# Patient Record
Sex: Female | Born: 1940 | Race: White | Hispanic: No | Smoking: Former smoker
Health system: Southern US, Community
[De-identification: ages and names within clinical notes are randomized; demographics above are authoritative.]

## PROBLEM LIST (undated history)

## (undated) DIAGNOSIS — M1990 Unspecified osteoarthritis, unspecified site: Secondary | ICD-10-CM

## (undated) DIAGNOSIS — T7840XA Allergy, unspecified, initial encounter: Secondary | ICD-10-CM

## (undated) DIAGNOSIS — E7800 Pure hypercholesterolemia, unspecified: Secondary | ICD-10-CM

## (undated) DIAGNOSIS — R32 Unspecified urinary incontinence: Secondary | ICD-10-CM

## (undated) DIAGNOSIS — G709 Myoneural disorder, unspecified: Secondary | ICD-10-CM

## (undated) DIAGNOSIS — Z8719 Personal history of other diseases of the digestive system: Secondary | ICD-10-CM

## (undated) DIAGNOSIS — D649 Anemia, unspecified: Secondary | ICD-10-CM

## (undated) DIAGNOSIS — J189 Pneumonia, unspecified organism: Secondary | ICD-10-CM

## (undated) DIAGNOSIS — F32A Depression, unspecified: Secondary | ICD-10-CM

## (undated) DIAGNOSIS — K219 Gastro-esophageal reflux disease without esophagitis: Secondary | ICD-10-CM

## (undated) DIAGNOSIS — F329 Major depressive disorder, single episode, unspecified: Secondary | ICD-10-CM

## (undated) DIAGNOSIS — F419 Anxiety disorder, unspecified: Secondary | ICD-10-CM

## (undated) DIAGNOSIS — I10 Essential (primary) hypertension: Secondary | ICD-10-CM

## (undated) DIAGNOSIS — E039 Hypothyroidism, unspecified: Secondary | ICD-10-CM

## (undated) HISTORY — DX: Allergy, unspecified, initial encounter: T78.40XA

## (undated) HISTORY — PX: BACK SURGERY: SHX140

## (undated) HISTORY — PX: COLONOSCOPY: SHX174

## (undated) HISTORY — PX: TONSILLECTOMY: SUR1361

## (undated) HISTORY — PX: CARPAL TUNNEL RELEASE: SHX101

## (undated) HISTORY — PX: ABDOMINAL HYSTERECTOMY: SHX81

## (undated) HISTORY — PX: EYE SURGERY: SHX253

## (undated) HISTORY — PX: OTHER SURGICAL HISTORY: SHX169

## (undated) HISTORY — PX: WISDOM TOOTH EXTRACTION: SHX21

## (undated) HISTORY — PX: JOINT REPLACEMENT: SHX530

---
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
## 1991-02-19 HISTORY — PX: SALPINGOOPHORECTOMY: SHX82

## 1997-06-01 ENCOUNTER — Ambulatory Visit (HOSPITAL_COMMUNITY): Admission: RE | Admit: 1997-06-01 | Discharge: 1997-06-01 | Payer: Self-pay | Admitting: Family Medicine

## 1997-07-12 ENCOUNTER — Ambulatory Visit (HOSPITAL_COMMUNITY): Admission: RE | Admit: 1997-07-12 | Discharge: 1997-07-12 | Payer: Self-pay | Admitting: Family Medicine

## 1997-08-31 ENCOUNTER — Ambulatory Visit (HOSPITAL_COMMUNITY): Admission: RE | Admit: 1997-08-31 | Discharge: 1997-08-31 | Payer: Self-pay | Admitting: Family Medicine

## 1998-06-28 ENCOUNTER — Other Ambulatory Visit: Admission: RE | Admit: 1998-06-28 | Discharge: 1998-06-28 | Payer: Self-pay | Admitting: Obstetrics and Gynecology

## 1998-11-04 ENCOUNTER — Emergency Department (HOSPITAL_COMMUNITY): Admission: EM | Admit: 1998-11-04 | Discharge: 1998-11-04 | Payer: Self-pay | Admitting: Emergency Medicine

## 1998-11-04 ENCOUNTER — Encounter: Payer: Self-pay | Admitting: Emergency Medicine

## 2000-11-17 ENCOUNTER — Ambulatory Visit: Admission: RE | Admit: 2000-11-17 | Discharge: 2000-11-17 | Payer: Self-pay | Admitting: Family Medicine

## 2002-02-03 ENCOUNTER — Other Ambulatory Visit: Admission: RE | Admit: 2002-02-03 | Discharge: 2002-02-03 | Payer: Self-pay | Admitting: Obstetrics and Gynecology

## 2003-03-25 ENCOUNTER — Encounter: Admission: RE | Admit: 2003-03-25 | Discharge: 2003-05-17 | Payer: Self-pay | Admitting: Internal Medicine

## 2003-11-29 ENCOUNTER — Other Ambulatory Visit: Admission: RE | Admit: 2003-11-29 | Discharge: 2003-11-29 | Payer: Self-pay | Admitting: Internal Medicine

## 2003-12-17 ENCOUNTER — Observation Stay (HOSPITAL_COMMUNITY): Admission: EM | Admit: 2003-12-17 | Discharge: 2003-12-18 | Payer: Self-pay | Admitting: Diagnostic Radiology

## 2004-03-14 ENCOUNTER — Encounter: Admission: RE | Admit: 2004-03-14 | Discharge: 2004-03-14 | Payer: Self-pay | Admitting: Sports Medicine

## 2004-05-01 ENCOUNTER — Encounter: Admission: RE | Admit: 2004-05-01 | Discharge: 2004-05-01 | Payer: Self-pay | Admitting: Sports Medicine

## 2004-05-29 ENCOUNTER — Ambulatory Visit (HOSPITAL_COMMUNITY): Admission: RE | Admit: 2004-05-29 | Discharge: 2004-05-30 | Payer: Self-pay | Admitting: Neurological Surgery

## 2005-08-26 ENCOUNTER — Ambulatory Visit (HOSPITAL_COMMUNITY): Admission: RE | Admit: 2005-08-26 | Discharge: 2005-08-27 | Payer: Self-pay | Admitting: Neurological Surgery

## 2006-07-11 ENCOUNTER — Other Ambulatory Visit: Admission: RE | Admit: 2006-07-11 | Discharge: 2006-07-11 | Payer: Self-pay | Admitting: Internal Medicine

## 2008-03-24 ENCOUNTER — Inpatient Hospital Stay (HOSPITAL_COMMUNITY): Admission: RE | Admit: 2008-03-24 | Discharge: 2008-03-28 | Payer: Self-pay | Admitting: Neurological Surgery

## 2008-11-09 ENCOUNTER — Emergency Department (HOSPITAL_COMMUNITY): Admission: EM | Admit: 2008-11-09 | Discharge: 2008-11-09 | Payer: Self-pay | Admitting: Family Medicine

## 2010-01-23 DIAGNOSIS — G5740 Lesion of medial popliteal nerve, unspecified lower limb: Secondary | ICD-10-CM | POA: Insufficient documentation

## 2010-04-06 DIAGNOSIS — M159 Polyosteoarthritis, unspecified: Secondary | ICD-10-CM | POA: Insufficient documentation

## 2010-06-05 LAB — BASIC METABOLIC PANEL
BUN: 14 mg/dL (ref 6–23)
BUN: 16 mg/dL (ref 6–23)
CO2: 27 mEq/L (ref 19–32)
CO2: 29 mEq/L (ref 19–32)
Calcium: 10 mg/dL (ref 8.4–10.5)
Calcium: 8.2 mg/dL — ABNORMAL LOW (ref 8.4–10.5)
Chloride: 104 mEq/L (ref 96–112)
Chloride: 104 mEq/L (ref 96–112)
Creatinine, Ser: 0.91 mg/dL (ref 0.4–1.2)
Creatinine, Ser: 1.01 mg/dL (ref 0.4–1.2)
GFR calc Af Amer: >60 mL/min (ref >60)
GFR calc Af Amer: >60 mL/min (ref >60)
GFR calc non Af Amer: 55 mL/min — ABNORMAL LOW (ref >60)
GFR calc non Af Amer: >60 mL/min (ref >60)
Glucose, Bld: 101 mg/dL — ABNORMAL HIGH (ref 70–99)
Glucose, Bld: 129 mg/dL — ABNORMAL HIGH (ref 70–99)
Potassium: 4.6 mEq/L (ref 3.5–5.1)
Potassium: 4.8 mEq/L (ref 3.5–5.1)
Sodium: 135 mEq/L (ref 135–145)
Sodium: 140 mEq/L (ref 135–145)

## 2010-06-05 LAB — TYPE AND SCREEN
ABO/RH(D): B POS
Antibody Screen: NEGATIVE

## 2010-06-05 LAB — CBC
HCT: 33 % — ABNORMAL LOW (ref 36.0–46.0)
HCT: 39.5 % (ref 36.0–46.0)
Hemoglobin: 11.1 g/dL — ABNORMAL LOW (ref 12.0–15.0)
Hemoglobin: 13.4 g/dL (ref 12.0–15.0)
MCHC: 33.6 g/dL (ref 30.0–36.0)
MCHC: 34.1 g/dL (ref 30.0–36.0)
MCV: 89.8 fL (ref 78.0–100.0)
MCV: 90.1 fL (ref 78.0–100.0)
Platelets: 263 10*3/uL (ref 150–400)
Platelets: 311 10*3/uL (ref 150–400)
RBC: 3.66 MIL/uL — ABNORMAL LOW (ref 3.87–5.11)
RBC: 4.4 MIL/uL (ref 3.87–5.11)
RDW: 13.3 % (ref 11.5–15.5)
RDW: 13.5 % (ref 11.5–15.5)
WBC: 12.7 10*3/uL — ABNORMAL HIGH (ref 4.0–10.5)
WBC: 8.6 10*3/uL (ref 4.0–10.5)

## 2010-06-05 LAB — ABO/RH: ABO/RH(D): B POS

## 2010-07-03 NOTE — Op Note (Signed)
NAMEPHEONIX, WISBY             ACCOUNT NO.:  [id number]   MEDICAL RECORD NO.:  [id number]          PATIENT TYPE:  INP   LOCATION:  3172                         FACILITY:  [facility]   PHYSICIAN:  Stefani Dama, M.D.  DATE OF BIRTH:  16-Dec-1940   DATE OF PROCEDURE:  03/24/2008  DATE OF DISCHARGE:                               OPERATIVE REPORT   PREOPERATIVE DIAGNOSIS:  Spondylolisthesis with herniated nucleus  pulposus, L4-L5, lumbar radiculopathy.   POSTOPERATIVE DIAGNOSIS:  Spondylolisthesis with herniated nucleus  pulposus, L4-L5, lumbar radiculopathy.   PROCEDURE:  Posterior lumbar decompression L4-5, decompression of L4 and  L5 nerve roots, posterior lumbar interbody arthrodesis with PEEK  spacers, pedicle screw fixation L4-5, posterolateral arthrodesis using  local autograft and allograft.   SURGEON:  Stefani Dama, MD   FIRST ASSISTANT:  Clydene Fake, MD   INDICATIONS:  Shelly Hickman is a 70 year old individual who has had  significant back and bilateral lower extremity pain, worse on the right  side.  She has evidence of an advanced spondylolisthesis with a large  herniated disk at the L4-L5 level, eccentric to the right side.  She has  been advised regarding surgical decompression and arthrodesis.   PROCEDURE:  The patient was brought to the operating room, supine on the  stretcher.  After smooth induction of general endotracheal anesthesia,  she was turned prone.  Back was prepped with alcohol and DuraPrep, and  draped in the sterile fashion.  Midline incision was created with an  elliptical incision around her previous scar.  Scar was excised.  Dissection was carried down to lumbodorsal fascia, which was opened on  either side of the midline to expose interspinous spaces at L4 and L5.  The space was identified positively with radiography.  Then, bilateral  laminotomies were created removing the inferior margin lamina, arch of  L4 exposing the common dural  tube and takeoff of the L5 nerve root  inferiorly.  This was first done on the left side.  Complete facetectomy  was performed in this level and superiorly the L4 nerve root takeoff  could be identified.  On the left side, there was noted to be  significant adhesions and scar tissue which was released carefully using  the microdissection technique.  On the right side, then a large fragment  of disk was encountered under the L4 nerve root superiorly.  This was  carefully dissected, and freed the L4 nerve root doing microdissection  technique.  Further fragments of disk were then removed from under the  common dural tube.  The L5 nerve root was decompressed into the region  of the foramen, partly from disk material and partly from fibrosis.  The  disk space was then entered and significant quantity of severely  desiccated disk material was removed.  Complete diskectomy was performed  using a series of interspace spreaders to allow for a good dissection  within the disk space.  Ultimately, 10 mm PEEK spacers could be fitted  into the interspace, after the endplates were completely decorticated.  The PEEK spacers were filled with autograft and allograft with Infuse.  Infuse and autograft was also laid into the interspace.  The interspace  was filled with this material in addition to a left-sided and right-  sided PEEK spacer.  Pedicle entry sites were then chosen using  fluoroscopic guidance, 6.5 x 45 mm screws were placed in L4 and L5  radiographically.  These were confirmed being in good position with both  AP and lateral fluoroscopic images, and ultimately, 40-mm rods were used  to connect the 2 screws and tightened them in position by creating a  nice, stable, well-aligned construct.  With this, posterolateral gutters  that had been previously decorticated, were filled with combination of  allograft and Infuse.  Once this was completed, the egress of the nerve  roots particularly L4 and L5  were checked, and they were found be free  and clear.  Hemostasis in the soft tissues was established and the  lumbodorsal fascia was closed with #1 Vicryl in interrupted fashion, 2-0  Vicryl was used in the subcutaneous tissues, and 3-0 Vicryl  subcuticularly.  Dermabond was placed on the skin.  Blood loss was  estimated, 700 mL.  Approximately 200 mL of Cell Saver blood was  returned.      Stefani Dama, M.D.  Electronically Signed     HJE/MEDQ  D:  03/24/2008  T:  03/25/2008  Job:  [id number]

## 2010-07-03 NOTE — Discharge Summary (Signed)
Shelly Hickman, Shelly Hickman             ACCOUNT NO.:  [id number]   MEDICAL RECORD NO.:  [id number]          PATIENT TYPE:  INP   LOCATION:  3038                         FACILITY:  [facility]   PHYSICIAN:  Stefani Dama, M.D.  DATE OF BIRTH:  18-Aug-1940   DATE OF ADMISSION:  03/24/2008  DATE OF DISCHARGE:  03/28/2008                               DISCHARGE SUMMARY   ADDENDUM   No change in discharge instructions and no change in discharge summary  from March 27, 2008.  The patient remained overnight one more day.  She was ready for discharge home on March 28, 2008, comfortable on  Vicodin and Valium for pain control, eating well, voiding well, and  mobilizing well with physical therapy.  Follow up in 2-3 weeks with Dr.  Danielle Dess.  Contact our office prior to followup if any questions or  concerns.  Continue with low back precautions.      Shelly Hickman.      Stefani Dama, M.D.  Electronically Signed    SCI/MEDQ  D:  03/28/2008  T:  03/28/2008  Job:  [id number]

## 2010-07-03 NOTE — Discharge Summary (Signed)
[name], [name] NO.:  [id number]   MEDICAL RECORD NO.:  [id number]          PATIENT TYPE:  INP   LOCATION:  3038                         FACILITY:  [facility]   PHYSICIAN:  Danae Orleans. Venetia Maxon, M.D.  DATE OF BIRTH:  1941-01-07   DATE OF ADMISSION:  03/24/2008  DATE OF DISCHARGE:  03/27/2008                               DISCHARGE SUMMARY   REASON FOR ADMISSION:  Spondylolisthesis of L4-L5 with spondylosis.   FINAL DIAGNOSES:  Spondylolisthesis of L4-L5 with spondylosis.   HISTORY OF ILLNESS AND HOSPITAL COURSE:  Shelly Hickman is a 70-year-  old woman with lower extremity radiculopathy and mobile  spondylolisthesis of L4-L5.  She was admitted to the hospital and  underwent decompression and fusion with posterior lumbar interbody  fusion with pedicle screw fixation at the L4-5 level.  Postoperatively,  she did well and was gradually mobilized.  She did have some  postoperative fever, which improved with use of incentive spirometry.  She was gradually mobilized with physical therapy, was doing well on the  March 27, 2008, and was discharged home with rolling walker with  discharge medications of hydrocodone and Valium with instructions to  follow up with Dr. Danielle Dess 2-3 weeks postoperatively.      Danae Orleans. Venetia Maxon, M.D.  Electronically Signed     JDS/MEDQ  D:  03/27/2008  T:  03/28/2008  Job:  [id number]

## 2010-07-06 NOTE — Op Note (Signed)
Shelly Hickman, Shelly Hickman             ACCOUNT NO.:  [id number]   MEDICAL RECORD NO.:  [id number]          PATIENT TYPE:  OIB   LOCATION:  2899                         FACILITY:  [facility]   PHYSICIAN:  Stefani Dama, M.D.  DATE OF BIRTH:  07-Aug-1940   DATE OF PROCEDURE:  05/29/2004  DATE OF DISCHARGE:                                 OPERATIVE REPORT   PREOPERATIVE DIAGNOSIS:  Spondylosis, L4-5, with left lumbar radiculopathy  and synovial cyst.   POSTOPERATIVE DIAGNOSIS:  Spondylosis, L4-5, with left lumbar radiculopathy  and synovial cyst.   PROCEDURES:  Laminotomy and foraminotomy, L4-5, left, with operating  microscope and microdissection technique.   SURGEON:  Stefani Dama, M.D.   FIRST ASSISTANT:  Hewitt Shorts, M.D.   ANESTHESIA:  General endotracheal.   INDICATIONS:  The patient is a 70 year old individual who has had  significant back and left lower extremity pain since November.  She has  evidence of a large synovial cyst compressing her left L5 nerve root.  She  was advised regarding surgical extirpation.   DESCRIPTION OF PROCEDURE:  The patient was brought to the operating room  supine on the stretcher.  After smooth induction of general endotracheal  anesthesia, she was turned prone onto vertical rolls and placed carefully  with padding to all of the bony prominences.  The back was prepped with  Duraprep and draped in a sterile fashion.  X-ray guidance was used to  localize L4-5 and a midline incision was created and carried down to the  lumbar dorsal fascia after infiltrating the skin with 1% lidocaine with  epinephrine 1:100,000 for 8 mL.  The left paraspinous musculature was then  separated from the spinous process and posterior interspinous ligament and  dissection was performed subperiosteally to expose the interspace at L4-5.  A self-retaining retractor was placed in the wound and then a laminotomy was  created, removing the inferior margin of the lamina  of L4 out to the medial  wall of the facet at L4-5.  As the ligament was taken up, a cystic cavity  was entered which had thick grumous material that was removed from within  it.  The dura was then identified on the medial aspect and the inner layer  of the ligament was noted to be densely adherent to the dura.  Careful  dissection using microdissection technique under the operating microscope  was undertaken to remove in a fragment fashion the synovial cyst from the  medial aspect of the facet joint.  The medial facetectomy was performed,  removing the inferior margin of the lamina of L4, including the medial  facet, and this allowed for freeing of the cystic mass.  The dura was then  carefully dissected free both superiorly and inferiorly and out over the L5  nerve root, removing all of the cystic tissue.  In the end, the common dural  tube and the L5 nerve root were all decompressed.  Hemostasis in the soft  tissues was obtained.  The area was copiously irrigated with antibiotic  irrigating solution and then 40 mg of Depo-Medrol along with 25 mcg  of  fentanyl were left in the epidural space.  The lumbodorsal fascia was closed  with 1-0 Vicryl in interrupted fashion, 2-0 Vicryl  was used in the subcutaneous and subcuticular tissues and Dermabond was  placed on the skin.  The procedure was done with the assistance of Dr.  Newell Coral, who provided retraction along the medial aspect of the dura while  dissection was being completed.      HJE/MEDQ  D:  05/29/2004  T:  05/29/2004  Job:  [id number]

## 2010-07-06 NOTE — Op Note (Signed)
Shelly Hickman, Shelly Hickman             ACCOUNT NO.:  [id number]   MEDICAL RECORD NO.:  [id number]          PATIENT TYPE:  AMB   LOCATION:  SDS                          FACILITY:  [facility]   PHYSICIAN:  Stefani Dama, M.D.  DATE OF BIRTH:  1940-09-30   DATE OF PROCEDURE:  08/26/2005  DATE OF DISCHARGE:                                 OPERATIVE REPORT   PREOPERATIVE DIAGNOSIS:  Spondylosis L4-L5 with right lumbar radiculopathy.   POSTOPERATIVE DIAGNOSIS:  Spondylosis L4-L5 with right lumbar radiculopathy.   OPERATION:  Right L4-5 laminotomy and foraminotomy with decompression of the  right L5 nerve root, with the operating microscope, microdissection  technique used.   SURGEON:  Dr. Danielle Dess.   FIRST ASSISTANT:  Dr. Jeral Fruit.   ANESTHESIA:  General endotracheal.   INDICATIONS:  The patient is a 70 year old individual who has had  significant back and right lower extremity pain.  She underwent left-sided  laminotomy for a synovial cyst at L4-L5 a year ago, and this process has now  occurred on the right side with a large synovial cyst compressing the  exiting space for the L5 nerve root.   PROCEDURE:  The patient was brought to the operating room, supine on the  stretcher.  After smooth induction of general endotracheal anesthesia, she  was turned prone.  The back was prepped with DuraPrep and draped in a  sterile fashion.  An elliptical incision was made around the previous scar,  and this tissue was excised.  Dissection was carried down to the lumbodorsal  fascia, which was opened on the right side to expose the interlaminar space  at L4-L5.  This space was identified positively with a singular radiograph.  Laminotomy was then created, removing a significant amount of redundant  exogenous tissue from the external aspect of the facet joint.  Partial  facetectomy was completed, removing the inferior margin lamina of L4 out to  the medial wall, about a third of the facet joint.   Underlying this was  noted to be significant tissue that was attached to the yellow ligament, and  it was compressing the inner aspect of the thecal sac at the takeoff of the  L5 nerve root.  This was removed initially in a piecemeal fashion.  It was  noted be densely adherent to the thecal sac itself.  It was gently dissected  away with the use of the operating microscope and the microdissection  technique to explore this area and remove this redundant thickened tissue  that was pressing into the L5 nerve root.  Once this was completed, portions  of the tissue were noted to be attached superiorly and more medially to the  area of the dura, and this was dissected free.  Once all tissue was  dissected free and inspection identified good clearance of the common dural  tube at the takeoff the L5 nerve root and the L5 nerve root as it entered  the exit foramen, it was felt that the procedure was completed.  The  procedure was done both with Dr. Jeral Fruit to help and also explore and retract  the common  dural tube and the nerve root as the dissection ensued.  Then, 40  mg of Depo-Medrol along with 0.5 mL of fentanyl was left in the epidural  space.  Lumbodorsal fascia was reapproximated with #1 Vicryl in interrupted fashion,  2-0 Vicryl was used in the subcutaneous tissues, 3-0 Vicryl subcuticularly,  and Dermabond was placed on the skin.  The patient tolerated procedure well  and was returned to recovery in stable condition.      Stefani Dama, M.D.  Electronically Signed     HJE/MEDQ  D:  08/26/2005  T:  08/27/2005  Job:  [id number]

## 2011-02-04 DIAGNOSIS — H04209 Unspecified epiphora, unspecified lacrimal gland: Secondary | ICD-10-CM | POA: Insufficient documentation

## 2011-02-04 DIAGNOSIS — H04549 Stenosis of unspecified lacrimal canaliculi: Secondary | ICD-10-CM | POA: Insufficient documentation

## 2011-03-26 DIAGNOSIS — F339 Major depressive disorder, recurrent, unspecified: Secondary | ICD-10-CM | POA: Insufficient documentation

## 2011-03-26 DIAGNOSIS — M545 Low back pain, unspecified: Secondary | ICD-10-CM | POA: Insufficient documentation

## 2011-03-26 DIAGNOSIS — I10 Essential (primary) hypertension: Secondary | ICD-10-CM | POA: Insufficient documentation

## 2011-03-26 DIAGNOSIS — F32A Depression, unspecified: Secondary | ICD-10-CM | POA: Insufficient documentation

## 2011-05-09 DIAGNOSIS — N951 Menopausal and female climacteric states: Secondary | ICD-10-CM | POA: Insufficient documentation

## 2011-06-27 DIAGNOSIS — M8580 Other specified disorders of bone density and structure, unspecified site: Secondary | ICD-10-CM | POA: Insufficient documentation

## 2012-12-07 ENCOUNTER — Ambulatory Visit
Admission: RE | Admit: 2012-12-07 | Discharge: 2012-12-07 | Disposition: A | Discharge status: Home / Self Care | Payer: Medicare Other | Source: Ambulatory Visit | Attending: Family Medicine | Admitting: Family Medicine

## 2012-12-07 ENCOUNTER — Other Ambulatory Visit: Payer: Self-pay | Admitting: Family Medicine

## 2012-12-07 DIAGNOSIS — R059 Cough, unspecified: Secondary | ICD-10-CM

## 2012-12-07 DIAGNOSIS — R05 Cough: Secondary | ICD-10-CM

## 2012-12-07 DIAGNOSIS — R509 Fever, unspecified: Secondary | ICD-10-CM

## 2012-12-10 ENCOUNTER — Inpatient Hospital Stay (HOSPITAL_COMMUNITY): Payer: Medicare Other

## 2012-12-10 ENCOUNTER — Inpatient Hospital Stay (HOSPITAL_COMMUNITY)
Admission: AD | Admit: 2012-12-10 | Discharge: 2012-12-13 | DRG: 194 | Disposition: A | Discharge status: Home / Self Care | Payer: Medicare Other | Source: Ambulatory Visit | Attending: Internal Medicine | Admitting: Internal Medicine

## 2012-12-10 ENCOUNTER — Encounter (HOSPITAL_COMMUNITY): Payer: Self-pay

## 2012-12-10 DIAGNOSIS — K219 Gastro-esophageal reflux disease without esophagitis: Secondary | ICD-10-CM | POA: Diagnosis present

## 2012-12-10 DIAGNOSIS — Z880 Allergy status to penicillin: Secondary | ICD-10-CM

## 2012-12-10 DIAGNOSIS — I10 Essential (primary) hypertension: Secondary | ICD-10-CM | POA: Diagnosis present

## 2012-12-10 DIAGNOSIS — J189 Pneumonia, unspecified organism: Secondary | ICD-10-CM

## 2012-12-10 DIAGNOSIS — E871 Hypo-osmolality and hyponatremia: Secondary | ICD-10-CM | POA: Diagnosis present

## 2012-12-10 DIAGNOSIS — Z87891 Personal history of nicotine dependence: Secondary | ICD-10-CM

## 2012-12-10 HISTORY — DX: Pure hypercholesterolemia, unspecified: E78.00

## 2012-12-10 HISTORY — DX: Depression, unspecified: F32.A

## 2012-12-10 HISTORY — DX: Anxiety disorder, unspecified: F41.9

## 2012-12-10 HISTORY — DX: Anemia, unspecified: D64.9

## 2012-12-10 HISTORY — DX: Essential (primary) hypertension: I10

## 2012-12-10 HISTORY — DX: Major depressive disorder, single episode, unspecified: F32.9

## 2012-12-10 HISTORY — DX: Pneumonia, unspecified organism: J18.9

## 2012-12-10 LAB — URINALYSIS, ROUTINE W REFLEX MICROSCOPIC
Glucose, UA: NEGATIVE mg/dL
Hgb urine dipstick: NEGATIVE
Ketones, ur: 40 mg/dL — AB
Nitrite: NEGATIVE
Protein, ur: 100 mg/dL — AB
Specific Gravity, Urine: 1.032 — ABNORMAL HIGH (ref 1.005–1.030)
Urobilinogen, UA: 1 mg/dL (ref 0.0–1.0)
pH: 6 (ref 5.0–8.0)

## 2012-12-10 LAB — COMPREHENSIVE METABOLIC PANEL
ALT: 17 U/L (ref 0–35)
AST: 26 U/L (ref 0–37)
Albumin: 2.4 g/dL — ABNORMAL LOW (ref 3.5–5.2)
Alkaline Phosphatase: 89 U/L (ref 39–117)
BUN: 13 mg/dL (ref 6–23)
CO2: 31 mEq/L (ref 19–32)
Calcium: 9.3 mg/dL (ref 8.4–10.5)
Chloride: 91 mEq/L — ABNORMAL LOW (ref 96–112)
Creatinine, Ser: 0.9 mg/dL (ref 0.50–1.10)
GFR calc Af Amer: 73 mL/min — ABNORMAL LOW (ref >90)
GFR calc non Af Amer: 63 mL/min — ABNORMAL LOW (ref >90)
Glucose, Bld: 165 mg/dL — ABNORMAL HIGH (ref 70–99)
Potassium: 4.5 mEq/L (ref 3.5–5.1)
Sodium: 128 mEq/L — ABNORMAL LOW (ref 135–145)
Total Bilirubin: 0.3 mg/dL (ref 0.3–1.2)
Total Protein: 6.7 g/dL (ref 6.0–8.3)

## 2012-12-10 LAB — CBC
HCT: 33.1 % — ABNORMAL LOW (ref 36.0–46.0)
Hemoglobin: 10.9 g/dL — ABNORMAL LOW (ref 12.0–15.0)
MCH: 28.6 pg (ref 26.0–34.0)
MCHC: 32.9 g/dL (ref 30.0–36.0)
MCV: 86.9 fL (ref 78.0–100.0)
Platelets: 480 10*3/uL — ABNORMAL HIGH (ref 150–400)
RBC: 3.81 MIL/uL — ABNORMAL LOW (ref 3.87–5.11)
RDW: 13.6 % (ref 11.5–15.5)
WBC: 20.7 10*3/uL — ABNORMAL HIGH (ref 4.0–10.5)

## 2012-12-10 LAB — URINE MICROSCOPIC-ADD ON

## 2012-12-10 LAB — TROPONIN I
Troponin I: <0.3 ng/mL (ref <0.30)
Troponin I: <0.3 ng/mL (ref <0.30)

## 2012-12-10 MED ORDER — DULOXETINE HCL 30 MG PO CPEP
30.0000 mg | ORAL_CAPSULE | Freq: Every day | ORAL | Status: DC
  Administered 2012-12-10 – 2012-12-13 (×4): 30 mg via ORAL
  Filled 2012-12-10 (×4): qty 1

## 2012-12-10 MED ORDER — AMLODIPINE BESYLATE 10 MG PO TABS
10.0000 mg | ORAL_TABLET | Freq: Every day | ORAL | Status: DC
  Administered 2012-12-10 – 2012-12-13 (×4): 10 mg via ORAL
  Filled 2012-12-10 (×5): qty 1

## 2012-12-10 MED ORDER — VANCOMYCIN HCL 10 G IV SOLR
1250.0000 mg | Freq: Once | INTRAVENOUS | Status: AC
  Administered 2012-12-10: 1250 mg via INTRAVENOUS
  Filled 2012-12-10: qty 1250

## 2012-12-10 MED ORDER — VANCOMYCIN HCL 10 G IV SOLR
1250.0000 mg | Freq: Once | INTRAVENOUS | Status: DC
  Filled 2012-12-10: qty 1250

## 2012-12-10 MED ORDER — OMEGA-3-ACID ETHYL ESTERS 1 G PO CAPS
2.0000 g | ORAL_CAPSULE | Freq: Two times a day (BID) | ORAL | Status: DC
  Administered 2012-12-10 – 2012-12-13 (×6): 2 g via ORAL
  Filled 2012-12-10 (×7): qty 2

## 2012-12-10 MED ORDER — IOHEXOL 300 MG/ML  SOLN
80.0000 mL | Freq: Once | INTRAMUSCULAR | Status: AC | PRN
  Administered 2012-12-10: 80 mL via INTRAVENOUS

## 2012-12-10 MED ORDER — DEXTROSE 5 % IV SOLN
2.0000 g | Freq: Three times a day (TID) | INTRAVENOUS | Status: DC
  Filled 2012-12-10 (×2): qty 2

## 2012-12-10 MED ORDER — VANCOMYCIN HCL IN DEXTROSE 1-5 GM/200ML-% IV SOLN
1000.0000 mg | Freq: Two times a day (BID) | INTRAVENOUS | Status: DC
  Administered 2012-12-11: 1000 mg via INTRAVENOUS
  Filled 2012-12-10 (×2): qty 200

## 2012-12-10 MED ORDER — TRAZODONE HCL 100 MG PO TABS
100.0000 mg | ORAL_TABLET | Freq: Every day | ORAL | Status: DC
  Administered 2012-12-10 – 2012-12-12 (×3): 100 mg via ORAL
  Filled 2012-12-10 (×4): qty 1

## 2012-12-10 MED ORDER — SODIUM CHLORIDE 0.9 % IV SOLN
INTRAVENOUS | Status: DC
  Administered 2012-12-10 – 2012-12-13 (×3): via INTRAVENOUS

## 2012-12-10 MED ORDER — LORAZEPAM 0.5 MG PO TABS
0.5000 mg | ORAL_TABLET | Freq: Three times a day (TID) | ORAL | Status: DC
  Administered 2012-12-10 – 2012-12-12 (×8): 0.5 mg via ORAL
  Filled 2012-12-10 (×8): qty 1

## 2012-12-10 MED ORDER — SIMVASTATIN 10 MG PO TABS
10.0000 mg | ORAL_TABLET | Freq: Every day | ORAL | Status: DC
  Administered 2012-12-10 – 2012-12-12 (×3): 10 mg via ORAL
  Filled 2012-12-10 (×4): qty 1

## 2012-12-10 MED ORDER — DEXTROSE 5 % IV SOLN
2.0000 g | Freq: Three times a day (TID) | INTRAVENOUS | Status: DC
  Administered 2012-12-10 – 2012-12-11 (×2): 2 g via INTRAVENOUS
  Filled 2012-12-10 (×3): qty 2

## 2012-12-10 MED ORDER — FLUTICASONE PROPIONATE 50 MCG/ACT NA SUSP
2.0000 | Freq: Every day | NASAL | Status: DC
  Administered 2012-12-11 – 2012-12-13 (×3): 2 via NASAL
  Filled 2012-12-10: qty 16

## 2012-12-10 MED ORDER — HYDROCODONE-ACETAMINOPHEN 5-325 MG PO TABS
1.0000 | ORAL_TABLET | Freq: Four times a day (QID) | ORAL | Status: DC | PRN
  Administered 2012-12-13: 1 via ORAL
  Filled 2012-12-10: qty 1

## 2012-12-10 MED ORDER — MONTELUKAST SODIUM 10 MG PO TABS
10.0000 mg | ORAL_TABLET | Freq: Every day | ORAL | Status: DC
  Administered 2012-12-10 – 2012-12-12 (×3): 10 mg via ORAL
  Filled 2012-12-10 (×4): qty 1

## 2012-12-10 MED ORDER — ENOXAPARIN SODIUM 40 MG/0.4ML ~~LOC~~ SOLN
40.0000 mg | SUBCUTANEOUS | Status: DC
  Administered 2012-12-10 – 2012-12-12 (×3): 40 mg via SUBCUTANEOUS
  Filled 2012-12-10 (×4): qty 0.4

## 2012-12-10 NOTE — Progress Notes (Signed)
ANTIBIOTIC CONSULT NOTE - INITIAL  Pharmacy Consult for Vancomycin/Aztreonam Indication: pneumonia  Allergies  Allergen Reactions  . Avelox [Moxifloxacin Hcl In Nacl] Other (See Comments)    Chest pain  . Cephalosporins Hives    Patient Measurements: Height: 5\' 6"  (167.6 cm) Weight: 188 lb 11.2 oz (85.594 kg) IBW/kg (Calculated) : 59.3  Vital Signs: Temp: 100.3 F (37.9 C) (10/23 1100) Temp src: Oral (10/23 1100) BP: 121/73 mmHg (10/23 1100) Pulse Rate: 103 (10/23 1100) Intake/Output from previous day:   Intake/Output from this shift:    Labs: No results found for this basename: WBC, HGB, PLT, LABCREA, CREATININE,  in the last 72 hours Estimated Creatinine Clearance: 63.4 ml/min (by C-G formula based on Cr of 0.91). No results found for this basename: VANCOTROUGH, VANCOPEAK, VANCORANDOM, GENTTROUGH, GENTPEAK, GENTRANDOM, TOBRATROUGH, TOBRAPEAK, TOBRARND, AMIKACINPEAK, AMIKACINTROU, AMIKACIN,  in the last 72 hours   Microbiology: No results found for this or any previous visit (from the past 720 hour(s)).  Medical History: Past Medical History  Diagnosis Date  . Hypertension   . Hypercholesterolemia   . Depression   . Anxiety   . Pneumonia   . Anemia      Assessment: Shelly Hickman presents with HCAP after failed outpatient treatment with PO azithromycin.  MD ordered Vancomycin and Cefepime.  Patient reports allergy of hives to a cephalosporin she received 25 years ago for a skin infection.  She broke out in the hives and rash over her entire body, which she considered severe.  MD agreed to start Aztreonam instead.  Current temperature of 100.3  Weight 85.6 kg  Lab work pending, no history of renal disease  Goal of Therapy:  Vancomycin trough level 15-20 mcg/ml  Plan:   Vancomycin 1250 mg IV x 1.  Aztreonam 2g IV q8h.  F/u SCr for maintenance dosing of vancomycin and possible adjustment to aztreonam dose.  Clance Boll 12/10/2012,1:13 PM

## 2012-12-10 NOTE — Progress Notes (Signed)
Admission Nurse notified of patient's arrival to room 1521

## 2012-12-10 NOTE — Progress Notes (Signed)
-   Addendum to pharmacy Vanc/Aztreonam Consults -   SCr returns as 0.90 for CG CrCl 64 ml/min and normalized CrCl of 66 ml/min. Patient received Vancomycin 1250 mg IV x 1 at 1530 today.   Plan: Continue with Vancomycin 1g IV q12h. Continue Aztreonam 2g IV q8h as ordered.  Geoffry Paradise, PharmD, BCPS Pager: [phone] 4:28 PM Pharmacy #: [phone]

## 2012-12-10 NOTE — H&P (Signed)
[facility] History and Physical  Shelly Hickman ZOX:[id number] DOB: 01/12/1941 DOA: 12/10/2012  Referring physician:  PCP: No primary provider on file.  Specialists:   Chief Complaint: SOB, productive cough   HPI: Shelly Hickman is a 72 y.o. female with PMH of HTN, GERD, HPL presented to PCP office with worsening SOB, productive cough with R sided chest pain and chills, fatigue; she was started on outpatient  PO azithromycin 3 days ago for pneumonia; but symptoms progressive got worse; denies focal neurological symptoms, no nausea, vomiting or diarrhea;   Review of Systems: The patient denies anorexia, fever, weight loss,, vision loss, decreased hearing, hoarseness, chest pain, syncope,  peripheral edema, balance deficits, hemoptysis, abdominal pain, melena, hematochezia, severe indigestion/heartburn, hematuria, incontinence, genital sores, muscle weakness, suspicious skin lesions, transient blindness, difficulty walking, depression, unusual weight change, abnormal bleeding, enlarged lymph nodes, angioedema, and breast masses.    Past Medical History  Diagnosis Date  . Hypertension   . Hypercholesterolemia   . Depression   . Anxiety   . Pneumonia   . Anemia    Past Surgical History  Procedure Laterality Date  . Right knee arthroscopy    . Abdominal hysterectomy    . Back surgery    . 2 synovial cysts removed between l4-l5    . Tonsillectomy     Social History:  reports that she quit smoking about 50 years ago. Her smoking use included Cigarettes. She smoked 0.00 packs per day. She has never used smokeless tobacco. She reports that she does not drink alcohol or use illicit drugs. Home:  where does patient live--home, ALF, SNF? and with whom if at home? Yes:  Can patient participate in ADLs?  Allergies  Allergen Reactions  . Avelox [Moxifloxacin Hcl In Nacl] Other (See Comments)    Chest pain  . Cephalosporins Hives    Family History  Problem Relation Age of  Onset  . Hypertension Mother   . Alcoholism Father     (be sure to complete)  Prior to Admission medications   Medication Sig Start Date End Date Taking? Authorizing Provider  amLODipine (NORVASC) 10 MG tablet Take 10 mg by mouth daily.   Yes Historical Provider, MD  azithromycin (ZITHROMAX) 250 MG tablet Take 250 mg by mouth daily.   Yes Historical Provider, MD  celecoxib (CELEBREX) 200 MG capsule Take 200 mg by mouth 2 (two) times daily.   Yes Historical Provider, MD  diclofenac sodium (VOLTAREN) 1 % GEL Apply topically.   Yes Historical Provider, MD  DULoxetine (CYMBALTA) 30 MG capsule Take 30 mg by mouth daily.   Yes Historical Provider, MD  fluticasone (FLONASE) 50 MCG/ACT nasal spray Place 2 sprays into the nose daily.   Yes Historical Provider, MD  HYDROcodone-acetaminophen (NORCO/VICODIN) 5-325 MG per tablet Take 1 tablet by mouth every 6 (six) hours as needed for pain.   Yes Historical Provider, MD  LORazepam (ATIVAN) 0.5 MG tablet Take 0.5 mg by mouth every 8 (eight) hours.   Yes Historical Provider, MD  losartan-hydrochlorothiazide (HYZAAR) 100-25 MG per tablet Take 1 tablet by mouth daily.   Yes Historical Provider, MD  montelukast (SINGULAIR) 10 MG tablet Take 10 mg by mouth at bedtime.   Yes Historical Provider, MD  omega-3 acid ethyl esters (LOVAZA) 1 G capsule Take 2 g by mouth 2 (two) times daily.   Yes Historical Provider, MD  prednisoLONE 5 MG TABS tablet Take by mouth.   Yes Historical Provider, MD  simvastatin (ZOCOR) 10 MG  tablet Take 10 mg by mouth at bedtime.   Yes Historical Provider, MD  tobramycin (TOBREX) 0.3 % ophthalmic solution 1 drop every 4 (four) hours.   Yes Historical Provider, MD  traZODone (DESYREL) 100 MG tablet Take 100 mg by mouth at bedtime.   Yes Historical Provider, MD   Physical Exam: Filed Vitals:   12/10/12 1100  BP: 121/73  Pulse: 103  Temp: 100.3 F (37.9 C)  Resp: 18     General:  alert  Eyes: eom-i  ENT: perrla   Neck: supple,    Cardiovascular: s1,s2 rrr  Respiratory: few crackles in R side  Abdomen: soft, nt, nd   Skin: no rash  Musculoskeletal: no edema   Psychiatric: no hallucinations   Neurologic: cn 2-12 intact   Labs on Admission:  Basic Metabolic Panel: No results found for this basename: NA, K, CL, CO2, GLUCOSE, BUN, CREATININE, CALCIUM, MG, PHOS,  in the last 168 hours Liver Function Tests: No results found for this basename: AST, ALT, ALKPHOS, BILITOT, PROT, ALBUMIN,  in the last 168 hours No results found for this basename: LIPASE, AMYLASE,  in the last 168 hours No results found for this basename: AMMONIA,  in the last 168 hours CBC: No results found for this basename: WBC, NEUTROABS, HGB, HCT, MCV, PLT,  in the last 168 hours Cardiac Enzymes: No results found for this basename: CKTOTAL, CKMB, CKMBINDEX, TROPONINI,  in the last 168 hours  BNP (last 3 results) No results found for this basename: PROBNP,  in the last 8760 hours CBG: No results found for this basename: GLUCAP,  in the last 168 hours  Radiological Exams on Admission: No results found.  EKG: Independently reviewed. Not done yet   Assessment/Plan Principal Problem:   HCAP (healthcare-associated pneumonia) Active Problems:   GERD (gastroesophageal reflux disease)   HTN (hypertension)   72 y.o. female with PMH of HTN, GERD, HPL presented to PCP office with worsening SOB, productive cough with R sided chest pain and chills, fatigue admitted with pneumonia failed OP treatment  1. HCAP; hemodynamically stable;  -obtain blood c/s;labs; start IV atx, oxygen, bronchodilators prn; CT/chest for better evaluation   2. HTN stable cont home meds; hold ACE/diuretic due to contrast administration   3. GERD cont PPI;   4. R sided chest pain; likely pleuritic due to infiltrate/pneumonia; obtain ECG, trop   None;  if consultant consulted, please document name and whether formally or informally consulted  Code Status: full  (must indicate code status--if unknown or must be presumed, indicate so) Family Communication: none at the bedside  (indicate person spoken with, if applicable, with phone number if by telephone) Disposition Plan: home when ready  (indicate anticipated LOS)  Time spent: >45 minutes   Esperanza Sheets [facility] Pager [phone]  If 7PM-7AM, please contact night-coverage [email or web address] Password [facility] 12/10/2012, 12:47 PM

## 2012-12-11 DIAGNOSIS — J189 Pneumonia, unspecified organism: Principal | ICD-10-CM

## 2012-12-11 LAB — BASIC METABOLIC PANEL
BUN: 13 mg/dL (ref 6–23)
CO2: 27 mEq/L (ref 19–32)
Calcium: 9.1 mg/dL (ref 8.4–10.5)
Chloride: 90 mEq/L — ABNORMAL LOW (ref 96–112)
Creatinine, Ser: 0.77 mg/dL (ref 0.50–1.10)
GFR calc Af Amer: >90 mL/min (ref >90)
GFR calc non Af Amer: 83 mL/min — ABNORMAL LOW (ref >90)
Glucose, Bld: 132 mg/dL — ABNORMAL HIGH (ref 70–99)
Potassium: 3.6 mEq/L (ref 3.5–5.1)
Sodium: 128 mEq/L — ABNORMAL LOW (ref 135–145)

## 2012-12-11 LAB — CBC
HCT: 31.4 % — ABNORMAL LOW (ref 36.0–46.0)
Hemoglobin: 10.7 g/dL — ABNORMAL LOW (ref 12.0–15.0)
MCH: 29.3 pg (ref 26.0–34.0)
MCHC: 34.1 g/dL (ref 30.0–36.0)
MCV: 86 fL (ref 78.0–100.0)
Platelets: 513 10*3/uL — ABNORMAL HIGH (ref 150–400)
RBC: 3.65 MIL/uL — ABNORMAL LOW (ref 3.87–5.11)
RDW: 13.6 % (ref 11.5–15.5)
WBC: 21.4 10*3/uL — ABNORMAL HIGH (ref 4.0–10.5)

## 2012-12-11 LAB — EXPECTORATED SPUTUM ASSESSMENT W REFEX TO RESP CULTURE: Special Requests: NORMAL

## 2012-12-11 LAB — TROPONIN I: Troponin I: <0.3 ng/mL (ref <0.30)

## 2012-12-11 LAB — EXPECTORATED SPUTUM ASSESSMENT W GRAM STAIN, RFLX TO RESP C

## 2012-12-11 LAB — STREP PNEUMONIAE URINARY ANTIGEN: Strep Pneumo Urinary Antigen: NEGATIVE

## 2012-12-11 MED ORDER — LEVOFLOXACIN IN D5W 750 MG/150ML IV SOLN
750.0000 mg | INTRAVENOUS | Status: DC
Start: 2012-12-11 — End: 2012-12-12
  Administered 2012-12-11: 750 mg via INTRAVENOUS
  Filled 2012-12-11 (×2): qty 150

## 2012-12-11 MED ORDER — ENSURE COMPLETE PO LIQD
237.0000 mL | Freq: Two times a day (BID) | ORAL | Status: DC
  Administered 2012-12-11 – 2012-12-12 (×3): 237 mL via ORAL

## 2012-12-11 MED ORDER — SODIUM CHLORIDE 0.9 % IV SOLN
3.0000 g | Freq: Four times a day (QID) | INTRAVENOUS | Status: DC
  Administered 2012-12-11 – 2012-12-12 (×3): 3 g via INTRAVENOUS
  Filled 2012-12-11 (×5): qty 3

## 2012-12-11 NOTE — Progress Notes (Signed)
[facility] PROGRESS NOTE  Shelly Hickman ZOX:[id number] DOB: 10/31/1940 DOA: 12/10/2012 PCP: No primary provider on file.  HPI: Shelly Hickman is a 72 y.o. female with PMH of HTN, GERD, HPL presented to PCP office with worsening SOB, productive cough with R sided chest pain and chills, fatigue; she was started on outpatient PO azithromycin 3 days ago for pneumonia; but symptoms progressive got worse; denies focal neurological symptoms, no nausea, vomiting or diarrhea.   Assessment/Plan: CAP - clinically improving this morning. Will transition to Levofloxacin and Unasyn. CT with "possible early focus of necrosis or abscess". Her Avelox allergy is "chest pain" and Cephalosporins have given her hives but she has been tolerating Augmentin and Amoxicillin in the past.  HTN - continue home meds GERD - PPI Hyponatremia - suspect dehydration. Check urine/serum osm, urine sodium.   Diet: low salt Fluids: NS at 75 cc/h DVT Prophylaxis: Lovenox  Code Status: Full Family Communication: none  Disposition Plan: home when ready  Consultants:  none  Procedures:  none   Antibiotics  Anti-infectives   Start     Dose/Rate Route Frequency Ordered Stop   12/11/12 1400  levofloxacin (LEVAQUIN) IVPB 750 mg     750 mg 100 mL/hr over 90 Minutes Intravenous Every 24 hours 12/11/12 1101     12/11/12 1200  Ampicillin-Sulbactam (UNASYN) 3 g in sodium chloride 0.9 % 100 mL IVPB     3 g 100 mL/hr over 60 Minutes Intravenous Every 6 hours 12/11/12 1101     12/11/12 0400  vancomycin (VANCOCIN) IVPB 1000 mg/200 mL premix  Status:  Discontinued     1,000 mg 200 mL/hr over 60 Minutes Intravenous Every 12 hours 12/10/12 1629 12/11/12 1028   12/10/12 1830  aztreonam (AZACTAM) 2 g in dextrose 5 % 50 mL IVPB  Status:  Discontinued     2 g 100 mL/hr over 30 Minutes Intravenous 3 times per day 12/10/12 1816 12/11/12 1028   12/10/12 1630  vancomycin (VANCOCIN) 1,250 mg in sodium chloride 0.9 % 250  mL IVPB  Status:  Discontinued     1,250 mg 166.7 mL/hr over 90 Minutes Intravenous  Once 12/10/12 1529 12/10/12 1533   12/10/12 1400  vancomycin (VANCOCIN) 1,250 mg in sodium chloride 0.9 % 250 mL IVPB     1,250 mg 166.7 mL/hr over 90 Minutes Intravenous  Once 12/10/12 1322 12/10/12 1704   12/10/12 1400  aztreonam (AZACTAM) 2 g in dextrose 5 % 50 mL IVPB  Status:  Discontinued     2 g 100 mL/hr over 30 Minutes Intravenous 3 times per day 12/10/12 1342 12/10/12 1816     Antibiotics Given (last 72 hours)   Date/Time Action Medication Dose Rate   12/10/12 1534 Given   vancomycin (VANCOCIN) 1,250 mg in sodium chloride 0.9 % 250 mL IVPB 1,250 mg 166.7 mL/hr   12/10/12 1830 Given   aztreonam (AZACTAM) 2 g in dextrose 5 % 50 mL IVPB 2 g 100 mL/hr   12/11/12 0200 Given   aztreonam (AZACTAM) 2 g in dextrose 5 % 50 mL IVPB 2 g 100 mL/hr   12/11/12 8119 Given   vancomycin (VANCOCIN) IVPB 1000 mg/200 mL premix 1,000 mg 200 mL/hr   12/11/12 1459 Given   levofloxacin (LEVAQUIN) IVPB 750 mg 750 mg 100 mL/hr     HPI/Subjective: - no complaints, really wants to go home today.   Objective: Filed Vitals:   12/10/12 1350 12/10/12 2117 12/11/12 0602 12/11/12 1351  BP: 123/76 148/84 158/84  128/77  Pulse: 100 104 104 99  Temp: 98.3 F (36.8 C) 99.8 F (37.7 C) 97.3 F (36.3 C) 98.3 F (36.8 C)  TempSrc: Oral Oral Oral Oral  Resp: 18 18 20 18   Height:      Weight:      SpO2: 96% 97% 92% 92%    Intake/Output Summary (Last 24 hours) at 12/11/12 1618 Last data filed at 12/11/12 1007  Gross per 24 hour  Intake    240 ml  Output    150 ml  Net     90 ml   Filed Weights   12/10/12 1120  Weight: 85.594 kg (188 lb 11.2 oz)    Exam:  General:  NAD  Cardiovascular: regular rate and rhythm, without MRG  Respiratory: good air movement, no wheezing, rales on right  Abdomen: soft, not tender to palpation, positive bowel sounds  MSK: no peripheral edema  Neuro: non focal  Data  Reviewed: Basic Metabolic Panel:  Recent Labs Lab 12/10/12 1500 12/11/12 0051  NA 128* 128*  K 4.5 3.6  CL 91* 90*  CO2 31 27  GLUCOSE 165* 132*  BUN 13 13  CREATININE 0.90 0.77  CALCIUM 9.3 9.1   Liver Function Tests:  Recent Labs Lab 12/10/12 1500  AST 26  ALT 17  ALKPHOS 89  BILITOT 0.3  PROT 6.7  ALBUMIN 2.4*   CBC:  Recent Labs Lab 12/10/12 1500 12/11/12 0051  WBC 20.7* 21.4*  HGB 10.9* 10.7*  HCT 33.1* 31.4*  MCV 86.9 86.0  PLT 480* 513*   Cardiac Enzymes:  Recent Labs Lab 12/10/12 1500 12/10/12 1919 12/11/12 0051  TROPONINI <0.30 <0.30 <0.30   Recent Results (from the past 240 hour(s))  CULTURE, BLOOD (ROUTINE X 2)     Status: None   Collection Time    12/10/12 10:00 AM      Result Value Range Status   Specimen Description BLOOD LEFT ARM   Final   Special Requests BOTTLES DRAWN AEROBIC AND ANAEROBIC 5CC   Final   Culture  Setup Time     Final   Value: 12/10/2012 20:44     Performed at [facility]   Culture     Final   Value:        BLOOD CULTURE RECEIVED NO GROWTH TO DATE CULTURE WILL BE HELD FOR 5 DAYS BEFORE ISSUING A FINAL NEGATIVE REPORT     Performed at [facility]   Report Status PENDING   Incomplete  URINE CULTURE     Status: None   Collection Time    12/10/12 10:32 AM      Result Value Range Status   Specimen Description URINE, RANDOM   Final   Special Requests NONE   Final   Culture  Setup Time     Final   Value: 12/10/2012 21:17     Performed at [facility] Count PENDING   Incomplete   Culture     Final   Value: Culture reincubated for better growth     Performed at [facility]   Report Status PENDING   Incomplete  CULTURE, BLOOD (ROUTINE X 2)     Status: None   Collection Time    12/10/12  3:00 PM      Result Value Range Status   Specimen Description BLOOD LEFT ARM   Final   Special Requests BOTTLES DRAWN AEROBIC AND ANAEROBIC 5CC   Final   Culture  Setup  Time     Final    Value: 12/10/2012 20:44     Performed at [facility]   Culture     Final   Value:        BLOOD CULTURE RECEIVED NO GROWTH TO DATE CULTURE WILL BE HELD FOR 5 DAYS BEFORE ISSUING A FINAL NEGATIVE REPORT     Performed at [facility]   Report Status PENDING   Incomplete  CULTURE, EXPECTORATED SPUTUM-ASSESSMENT     Status: None   Collection Time    12/11/12 12:13 PM      Result Value Range Status   Specimen Description SPUTUM   Final   Special Requests Normal   Final   Sputum evaluation     Final   Value: THIS SPECIMEN IS ACCEPTABLE. RESPIRATORY CULTURE REPORT TO FOLLOW.   Report Status 12/11/2012 FINAL   Final     Studies: Ct Chest W Contrast  12/10/2012   CLINICAL DATA:  Pneumonia.  EXAM: CT CHEST WITH CONTRAST  TECHNIQUE: Multidetector CT imaging of the chest was performed during intravenous contrast administration.  CONTRAST:  80mL OMNIPAQUE IOHEXOL 300 MG/ML  SOLN  COMPARISON:  Chest x-ray 12/07/2012.  FINDINGS: The chest wall is unremarkable. No breast masses, supraclavicular or axillary adenopathy. The thyroid gland is normal. The bony thorax is intact. No destructive bone lesions or spinal canal compromise. Minimal degenerative changes involving the thoracic spine.  The heart is normal in size. No pericardial effusion. There arm mildly enlarged right hilar lymph nodes and smaller pretracheal and subcarinal lymph nodes. The aorta is normal in caliber. No dissection. Minimal atherosclerotic calcifications. Coronary artery calcifications are also noted. The esophagus is grossly normal.  Dense right upper lobe airspace consolidation with surrounding pneumonitis. There are vessels running through the area of dense airspace consolidation and a few air bronchograms. I think this is most likely pneumonia and I do not see an obstructing mass or endobronchial lesion. There is a age rounded area of lower attenuation within this airspace consolidation which could be an early long  abscess. There is an associated small right-sided parapneumonic effusion. No pulmonary lesions are nodules. The left lung is clear.  The upper abdomen is unremarkable.  IMPRESSION: Dense right upper lobe airspace consolidation with surrounding pneumonitis and possible early focus of necrosis or abscess. Associated right hilar and mediastinal lymphadenopathy. I believe this is most likely right upper lobe pneumonia and reactive adenopathy. Recommend aggressive antibiotic therapy and followup serial chest x-rays to make sure this resolves.   Electronically Signed   By: Loralie Champagne M.D.   On: 12/10/2012 20:27    Scheduled Meds: . amLODipine  10 mg Oral Daily  . ampicillin-sulbactam (UNASYN) IV  3 g Intravenous Q6H  . DULoxetine  30 mg Oral Daily  . enoxaparin (LOVENOX) injection  40 mg Subcutaneous Q24H  . feeding supplement (ENSURE COMPLETE)  237 mL Oral BID BM  . fluticasone  2 spray Each Nare Daily  . levofloxacin (LEVAQUIN) IV  750 mg Intravenous Q24H  . LORazepam  0.5 mg Oral Q8H  . montelukast  10 mg Oral QHS  . omega-3 acid ethyl esters  2 g Oral BID  . simvastatin  10 mg Oral QHS  . traZODone  100 mg Oral QHS   Continuous Infusions: . sodium chloride 75 mL/hr at 12/11/12 1332    Principal Problem:   HCAP (healthcare-associated pneumonia) Active Problems:   GERD (gastroesophageal reflux disease)   HTN (hypertension)  Time spent: 35  Pamella Pert, MD [facility] Pager [phone]. If 7 PM - 7 AM, please contact night-coverage at [email or web address], password [facility] 12/11/2012, 4:18 PM  LOS: 1 day

## 2012-12-11 NOTE — Progress Notes (Signed)
INITIAL NUTRITION ASSESSMENT  DOCUMENTATION CODES Per approved criteria  -Not Applicable   INTERVENTION: Ensure Complete po BID, each supplement provides 350 kcal and 13 grams of protein.  Encouraged intake of meals.  NUTRITION DIAGNOSIS: Inadequate oral intake related to decreased appetite as evidenced by observation..   Goal: Intake of meals and supplements to meet >/=90% of estimated needs  Monitor:  Intake, labs, weight trend  Reason for Assessment: mst  72 y.o. female  Admitting Dx: HCAP (healthcare-associated pneumonia)  ASSESSMENT: Patient reports good intake until 2 days prior to admit.  Current intake fair.  Ate little of breakfast this am but has eaten 75% of meals yesterday.  UBW of 195 lbs per patient.  Current weight decreased 7 lbs in the past week. Noted current low sodium. Patient reports following a diabetic diet that her husband follows.  Height: Ht Readings from Last 1 Encounters:  12/10/12 5\' 6"  (1.676 m)    Weight: Wt Readings from Last 1 Encounters:  12/10/12 188 lb 11.2 oz (85.594 kg)    Ideal Body Weight: 130 lbs  % Ideal Body Weight: 145  Wt Readings from Last 10 Encounters:  12/10/12 188 lb 11.2 oz (85.594 kg)    Usual Body Weight: 195 lbs  % Usual Body Weight: 96  BMI:  Body mass index is 30.47 kg/(m^2).  Estimated Nutritional Needs: Kcal: 1500-1700 Protein: 70-80 gm protein Fluid: 1.7L daily  Skin: wnl  Diet Order: Sodium Restricted  EDUCATION NEEDS: -No education needs identified at this time   Intake/Output Summary (Last 24 hours) at 12/11/12 0918 Last data filed at 12/11/12 0602  Gross per 24 hour  Intake    360 ml  Output    150 ml  Net    210 ml      Labs:   Recent Labs Lab 12/10/12 1500 12/11/12 0051  NA 128* 128*  K 4.5 3.6  CL 91* 90*  CO2 31 27  BUN 13 13  CREATININE 0.90 0.77  CALCIUM 9.3 9.1  GLUCOSE 165* 132*    CBG (last 3)  No results found for this basename: GLUCAP,  in the last 72  hours  Scheduled Meds: . amLODipine  10 mg Oral Daily  . aztreonam  2 g Intravenous Q8H  . DULoxetine  30 mg Oral Daily  . enoxaparin (LOVENOX) injection  40 mg Subcutaneous Q24H  . fluticasone  2 spray Each Nare Daily  . LORazepam  0.5 mg Oral Q8H  . montelukast  10 mg Oral QHS  . omega-3 acid ethyl esters  2 g Oral BID  . simvastatin  10 mg Oral QHS  . traZODone  100 mg Oral QHS  . vancomycin  1,000 mg Intravenous Q12H    Continuous Infusions: . sodium chloride 75 mL/hr at 12/10/12 1844    Past Medical History  Diagnosis Date  . Hypertension   . Hypercholesterolemia   . Depression   . Anxiety   . Pneumonia   . Anemia     Past Surgical History  Procedure Laterality Date  . Right knee arthroscopy    . Abdominal hysterectomy    . Back surgery    . 2 synovial cysts removed between l4-l5    . Tonsillectomy      Oran Rein, RD, LDN Clinical Inpatient Dietitian Pager:  [phone] Weekend and after hours pager:  [phone]

## 2012-12-11 NOTE — Progress Notes (Signed)
Pt fell after having a severe coughing spell while ambulating to the bathroom.  She was found sitting straight up beside the bathroom door with her legs straight out.  Pt stated that she did not hit her hit and she has no c/o pain.  Pt was alert and oriented.  Did VSs and paged MD.  MD stated that since she has no c/o pain that we will monitor her for any changes.

## 2012-12-11 NOTE — Progress Notes (Signed)
ANTIBIOTIC CONSULT NOTE - INITIAL  Pharmacy Consult for Unasyn / Levaquin Indication: pneumonia  Allergies  Allergen Reactions  . Avelox [Moxifloxacin Hcl In Nacl] Other (See Comments)    Chest pain  . Cephalosporins Hives    Patient Measurements: Height: 5\' 6"  (167.6 cm) Weight: 188 lb 11.2 oz (85.594 kg) IBW/kg (Calculated) : 59.3  Vital Signs: Temp: 97.3 F (36.3 C) (10/24 0602) Temp src: Oral (10/24 0602) BP: 158/84 mmHg (10/24 0602) Pulse Rate: 104 (10/24 0602) Intake/Output from previous day: 10/23 0701 - 10/24 0700 In: 360 [P.O.:360] Out: 150 [Urine:150] Intake/Output from this shift: Total I/O In: 120 [P.O.:120] Out: -   Labs:  Recent Labs  12/10/12 1500 12/11/12 0051  WBC 20.7* 21.4*  HGB 10.9* 10.7*  PLT 480* 513*  CREATININE 0.90 0.77   Estimated Creatinine Clearance: 72.1 ml/min (by C-G formula based on Cr of 0.77).    Microbiology: Recent Results (from the past 720 hour(s))  CULTURE, BLOOD (ROUTINE X 2)     Status: None   Collection Time    12/10/12 10:00 AM      Result Value Range Status   Specimen Description BLOOD LEFT ARM   Final   Special Requests BOTTLES DRAWN AEROBIC AND ANAEROBIC 5CC   Final   Culture  Setup Time     Final   Value: 12/10/2012 20:44     Performed at [facility]   Culture     Final   Value:        BLOOD CULTURE RECEIVED NO GROWTH TO DATE CULTURE WILL BE HELD FOR 5 DAYS BEFORE ISSUING A FINAL NEGATIVE REPORT     Performed at [facility]   Report Status PENDING   Incomplete  CULTURE, BLOOD (ROUTINE X 2)     Status: None   Collection Time    12/10/12  3:00 PM      Result Value Range Status   Specimen Description BLOOD LEFT ARM   Final   Special Requests BOTTLES DRAWN AEROBIC AND ANAEROBIC 5CC   Final   Culture  Setup Time     Final   Value: 12/10/2012 20:44     Performed at [facility]   Culture     Final   Value:        BLOOD CULTURE RECEIVED NO GROWTH TO DATE CULTURE WILL BE HELD FOR 5  DAYS BEFORE ISSUING A FINAL NEGATIVE REPORT     Performed at [facility]   Report Status PENDING   Incomplete    Medical History: Past Medical History  Diagnosis Date  . Hypertension   . Hypercholesterolemia   . Depression   . Anxiety   . Pneumonia   . Anemia      Assessment:  40 yoF presented with HCAP after failed outpatient treatment with PO azithromycin.  MD ordered Vancomycin and Cefepime.  Patient reports allergy of hives to a cephalosporin she received 25 years ago for a skin infection.  She broke out in the hives and rash over her entire body, which she considered severe.  MD agreed to start Aztreonam instead.  Today Dr. Elvera Lennox related that despite the above, the patient has reportedly tolerated Augmentin without a reaction, and the "chest pain" from Avelox was a sensation similar to having a tablet lodged in esophagus.   Orders received to switch antibiotic regimen to Unasyn / Levaquin with pharmacy dosing. . Goal of Therapy:  Eradication of infection Adjust antibiotic dosages for renal function  Plan:  1. Unasyn 3 grams IV q6h 2. Levaquin 750 mg IV q24h 3. Follow renal function 4. Follow cultures  Elie Goody, PharmD, BCPS Pager: [phone] 12/11/2012  10:59 AM

## 2012-12-12 LAB — CBC
HCT: 31.4 % — ABNORMAL LOW (ref 36.0–46.0)
Hemoglobin: 10.4 g/dL — ABNORMAL LOW (ref 12.0–15.0)
MCH: 28.5 pg (ref 26.0–34.0)
MCHC: 33.1 g/dL (ref 30.0–36.0)
MCV: 86 fL (ref 78.0–100.0)
Platelets: 518 10*3/uL — ABNORMAL HIGH (ref 150–400)
RBC: 3.65 MIL/uL — ABNORMAL LOW (ref 3.87–5.11)
RDW: 13.8 % (ref 11.5–15.5)
WBC: 17.2 10*3/uL — ABNORMAL HIGH (ref 4.0–10.5)

## 2012-12-12 LAB — URINE CULTURE: Colony Count: 65000

## 2012-12-12 LAB — BASIC METABOLIC PANEL
BUN: 8 mg/dL (ref 6–23)
CO2: 27 mEq/L (ref 19–32)
Calcium: 9.1 mg/dL (ref 8.4–10.5)
Chloride: 97 mEq/L (ref 96–112)
Creatinine, Ser: 0.65 mg/dL (ref 0.50–1.10)
GFR calc Af Amer: >90 mL/min (ref >90)
GFR calc non Af Amer: 88 mL/min — ABNORMAL LOW (ref >90)
Glucose, Bld: 126 mg/dL — ABNORMAL HIGH (ref 70–99)
Potassium: 3.5 mEq/L (ref 3.5–5.1)
Sodium: 134 mEq/L — ABNORMAL LOW (ref 135–145)

## 2012-12-12 LAB — LEGIONELLA ANTIGEN, URINE: Legionella Antigen, Urine: NEGATIVE

## 2012-12-12 LAB — SODIUM, URINE, RANDOM: Sodium, Ur: 95 mEq/L

## 2012-12-12 MED ORDER — GUAIFENESIN-DM 100-10 MG/5ML PO SYRP
5.0000 mL | ORAL_SOLUTION | ORAL | Status: DC | PRN
  Administered 2012-12-12: 5 mL via ORAL
  Filled 2012-12-12: qty 10

## 2012-12-12 MED ORDER — LEVOFLOXACIN 750 MG PO TABS
750.0000 mg | ORAL_TABLET | Freq: Every day | ORAL | Status: DC
  Administered 2012-12-12 – 2012-12-13 (×2): 750 mg via ORAL
  Filled 2012-12-12 (×2): qty 1

## 2012-12-12 MED ORDER — AMOXICILLIN-POT CLAVULANATE 875-125 MG PO TABS
1.0000 | ORAL_TABLET | Freq: Two times a day (BID) | ORAL | Status: DC
  Administered 2012-12-12 – 2012-12-13 (×3): 1 via ORAL
  Filled 2012-12-12 (×4): qty 1

## 2012-12-12 NOTE — Progress Notes (Signed)
[facility] PROGRESS NOTE  Shelly Hickman BJY:[id number] DOB: 1941/01/22 DOA: 12/10/2012 PCP: No primary provider on file.  HPI: Shelly Hickman is a 72 y.o. female with PMH of HTN, GERD, HPL presented to PCP office with worsening SOB, productive cough with R sided chest pain and chills, fatigue; she was started on outpatient PO azithromycin 3 days ago for pneumonia; but symptoms progressive got worse; denies focal neurological symptoms, no nausea, vomiting or diarrhea.   Assessment/Plan: CAP - continues to improve, breathing well, coughing a lot - micro still pending.  - transition to po Levo and Augmenting today.  HTN - continue home meds GERD - PPI Hyponatremia - suspect dehydration. Check urine/serum osm, urine sodium.  - improved.  Diet: low salt Fluids: NS at 75 cc/h DVT Prophylaxis: Lovenox  Code Status: Full Family Communication: none  Disposition Plan: home when ready  Consultants:  none  Procedures:  none   Antibiotics  Anti-infectives   Start     Dose/Rate Route Frequency Ordered Stop   12/12/12 1130  levofloxacin (LEVAQUIN) tablet 750 mg     750 mg Oral Daily 12/12/12 1123     12/12/12 1130  amoxicillin-clavulanate (AUGMENTIN) 875-125 MG per tablet 1 tablet     1 tablet Oral Every 12 hours 12/12/12 1123     12/11/12 1400  levofloxacin (LEVAQUIN) IVPB 750 mg  Status:  Discontinued     750 mg 100 mL/hr over 90 Minutes Intravenous Every 24 hours 12/11/12 1101 12/12/12 1123   12/11/12 1200  Ampicillin-Sulbactam (UNASYN) 3 g in sodium chloride 0.9 % 100 mL IVPB  Status:  Discontinued     3 g 100 mL/hr over 60 Minutes Intravenous Every 6 hours 12/11/12 1101 12/12/12 1123   12/11/12 0400  vancomycin (VANCOCIN) IVPB 1000 mg/200 mL premix  Status:  Discontinued     1,000 mg 200 mL/hr over 60 Minutes Intravenous Every 12 hours 12/10/12 1629 12/11/12 1028   12/10/12 1830  aztreonam (AZACTAM) 2 g in dextrose 5 % 50 mL IVPB  Status:  Discontinued     2  g 100 mL/hr over 30 Minutes Intravenous 3 times per day 12/10/12 1816 12/11/12 1028   12/10/12 1630  vancomycin (VANCOCIN) 1,250 mg in sodium chloride 0.9 % 250 mL IVPB  Status:  Discontinued     1,250 mg 166.7 mL/hr over 90 Minutes Intravenous  Once 12/10/12 1529 12/10/12 1533   12/10/12 1400  vancomycin (VANCOCIN) 1,250 mg in sodium chloride 0.9 % 250 mL IVPB     1,250 mg 166.7 mL/hr over 90 Minutes Intravenous  Once 12/10/12 1322 12/10/12 1704   12/10/12 1400  aztreonam (AZACTAM) 2 g in dextrose 5 % 50 mL IVPB  Status:  Discontinued     2 g 100 mL/hr over 30 Minutes Intravenous 3 times per day 12/10/12 1342 12/10/12 1816     Antibiotics Given (last 72 hours)   Date/Time Action Medication Dose Rate   12/10/12 1534 Given   vancomycin (VANCOCIN) 1,250 mg in sodium chloride 0.9 % 250 mL IVPB 1,250 mg 166.7 mL/hr   12/10/12 1830 Given   aztreonam (AZACTAM) 2 g in dextrose 5 % 50 mL IVPB 2 g 100 mL/hr   12/11/12 0200 Given   aztreonam (AZACTAM) 2 g in dextrose 5 % 50 mL IVPB 2 g 100 mL/hr   12/11/12 0865 Given   vancomycin (VANCOCIN) IVPB 1000 mg/200 mL premix 1,000 mg 200 mL/hr   12/11/12 1459 Given   levofloxacin (LEVAQUIN) IVPB 750 mg  750 mg 100 mL/hr   12/11/12 1751 Given   Ampicillin-Sulbactam (UNASYN) 3 g in sodium chloride 0.9 % 100 mL IVPB 3 g 100 mL/hr   12/12/12 0030 Given   Ampicillin-Sulbactam (UNASYN) 3 g in sodium chloride 0.9 % 100 mL IVPB 3 g 100 mL/hr   12/12/12 0544 Given   Ampicillin-Sulbactam (UNASYN) 3 g in sodium chloride 0.9 % 100 mL IVPB 3 g 100 mL/hr     HPI/Subjective: - feels better  Objective: Filed Vitals:   12/11/12 2103 12/12/12 0604 12/12/12 0800 12/12/12 0959  BP: 125/78 139/73  123/80  Pulse: 94 100    Temp: 98.3 F (36.8 C) 98.9 F (37.2 C)    TempSrc: Oral Oral    Resp: 18 20 20    Height:      Weight:      SpO2: 100% 94%      Intake/Output Summary (Last 24 hours) at 12/12/12 1123 Last data filed at 12/12/12 1046  Gross per 24 hour   Intake    240 ml  Output   1000 ml  Net   -760 ml   Filed Weights   12/10/12 1120  Weight: 85.594 kg (188 lb 11.2 oz)    Exam:  General:  NAD  Cardiovascular: regular rate and rhythm, without MRG  Respiratory: good air movement, no wheezing, rales on right  Abdomen: soft, not tender to palpation, positive bowel sounds  MSK: no peripheral edema  Neuro: non focal  Data Reviewed: Basic Metabolic Panel:  Recent Labs Lab 12/10/12 1500 12/11/12 0051 12/12/12 0556  NA 128* 128* 134*  K 4.5 3.6 3.5  CL 91* 90* 97  CO2 31 27 27   GLUCOSE 165* 132* 126*  BUN 13 13 8   CREATININE 0.90 0.77 0.65  CALCIUM 9.3 9.1 9.1   Liver Function Tests:  Recent Labs Lab 12/10/12 1500  AST 26  ALT 17  ALKPHOS 89  BILITOT 0.3  PROT 6.7  ALBUMIN 2.4*   CBC:  Recent Labs Lab 12/10/12 1500 12/11/12 0051 12/12/12 0556  WBC 20.7* 21.4* 17.2*  HGB 10.9* 10.7* 10.4*  HCT 33.1* 31.4* 31.4*  MCV 86.9 86.0 86.0  PLT 480* 513* 518*   Cardiac Enzymes:  Recent Labs Lab 12/10/12 1500 12/10/12 1919 12/11/12 0051  TROPONINI <0.30 <0.30 <0.30   Recent Results (from the past 240 hour(s))  CULTURE, BLOOD (ROUTINE X 2)     Status: None   Collection Time    12/10/12 10:00 AM      Result Value Range Status   Specimen Description BLOOD LEFT ARM   Final   Special Requests BOTTLES DRAWN AEROBIC AND ANAEROBIC 5CC   Final   Culture  Setup Time     Final   Value: 12/10/2012 20:44     Performed at [facility]   Culture     Final   Value:        BLOOD CULTURE RECEIVED NO GROWTH TO DATE CULTURE WILL BE HELD FOR 5 DAYS BEFORE ISSUING A FINAL NEGATIVE REPORT     Performed at [facility]   Report Status PENDING   Incomplete  URINE CULTURE     Status: None   Collection Time    12/10/12 10:32 AM      Result Value Range Status   Specimen Description URINE, RANDOM   Final   Special Requests NONE   Final   Culture  Setup Time     Final   Value: 12/10/2012 21:17  Performed at [facility] Count     Final   Value: 65,000 COLONIES/ML     Performed at [facility]   Culture     Final   Value: Multiple bacterial morphotypes present, none predominant. Suggest appropriate recollection if clinically indicated.     Performed at [facility]   Report Status 12/12/2012 FINAL   Final  CULTURE, BLOOD (ROUTINE X 2)     Status: None   Collection Time    12/10/12  3:00 PM      Result Value Range Status   Specimen Description BLOOD LEFT ARM   Final   Special Requests BOTTLES DRAWN AEROBIC AND ANAEROBIC 5CC   Final   Culture  Setup Time     Final   Value: 12/10/2012 20:44     Performed at [facility]   Culture     Final   Value:        BLOOD CULTURE RECEIVED NO GROWTH TO DATE CULTURE WILL BE HELD FOR 5 DAYS BEFORE ISSUING A FINAL NEGATIVE REPORT     Performed at [facility]   Report Status PENDING   Incomplete  CULTURE, EXPECTORATED SPUTUM-ASSESSMENT     Status: None   Collection Time    12/11/12 12:13 PM      Result Value Range Status   Specimen Description SPUTUM   Final   Special Requests Normal   Final   Sputum evaluation     Final   Value: THIS SPECIMEN IS ACCEPTABLE. RESPIRATORY CULTURE REPORT TO FOLLOW.   Report Status 12/11/2012 FINAL   Final  CULTURE, RESPIRATORY (NON-EXPECTORATED)     Status: None   Collection Time    12/11/12 12:13 PM      Result Value Range Status   Specimen Description SPUTUM   Final   Special Requests NONE   Final   Gram Stain     Final   Value: RARE WBC PRESENT, PREDOMINANTLY PMN     NO SQUAMOUS EPITHELIAL CELLS SEEN     NO ORGANISMS SEEN     Performed at [facility]   Culture     Final   Value: NORMAL OROPHARYNGEAL FLORA     Performed at [facility]   Report Status PENDING   Incomplete     Studies: Ct Chest W Contrast  12/10/2012   CLINICAL DATA:  Pneumonia.  EXAM: CT CHEST WITH CONTRAST  TECHNIQUE: Multidetector CT imaging of the chest was  performed during intravenous contrast administration.  CONTRAST:  80mL OMNIPAQUE IOHEXOL 300 MG/ML  SOLN  COMPARISON:  Chest x-ray 12/07/2012.  FINDINGS: The chest wall is unremarkable. No breast masses, supraclavicular or axillary adenopathy. The thyroid gland is normal. The bony thorax is intact. No destructive bone lesions or spinal canal compromise. Minimal degenerative changes involving the thoracic spine.  The heart is normal in size. No pericardial effusion. There arm mildly enlarged right hilar lymph nodes and smaller pretracheal and subcarinal lymph nodes. The aorta is normal in caliber. No dissection. Minimal atherosclerotic calcifications. Coronary artery calcifications are also noted. The esophagus is grossly normal.  Dense right upper lobe airspace consolidation with surrounding pneumonitis. There are vessels running through the area of dense airspace consolidation and a few air bronchograms. I think this is most likely pneumonia and I do not see an obstructing mass or endobronchial lesion. There is a age rounded area of lower attenuation within this airspace consolidation which could be an early long  abscess. There is an associated small right-sided parapneumonic effusion. No pulmonary lesions are nodules. The left lung is clear.  The upper abdomen is unremarkable.  IMPRESSION: Dense right upper lobe airspace consolidation with surrounding pneumonitis and possible early focus of necrosis or abscess. Associated right hilar and mediastinal lymphadenopathy. I believe this is most likely right upper lobe pneumonia and reactive adenopathy. Recommend aggressive antibiotic therapy and followup serial chest x-rays to make sure this resolves.   Electronically Signed   By: Loralie Champagne M.D.   On: 12/10/2012 20:27    Scheduled Meds: . amLODipine  10 mg Oral Daily  . ampicillin-sulbactam (UNASYN) IV  3 g Intravenous Q6H  . DULoxetine  30 mg Oral Daily  . enoxaparin (LOVENOX) injection  40 mg Subcutaneous  Q24H  . feeding supplement (ENSURE COMPLETE)  237 mL Oral BID BM  . fluticasone  2 spray Each Nare Daily  . levofloxacin (LEVAQUIN) IV  750 mg Intravenous Q24H  . LORazepam  0.5 mg Oral Q8H  . montelukast  10 mg Oral QHS  . omega-3 acid ethyl esters  2 g Oral BID  . simvastatin  10 mg Oral QHS  . traZODone  100 mg Oral QHS   Continuous Infusions: . sodium chloride 75 mL/hr at 12/12/12 0543    Principal Problem:   HCAP (healthcare-associated pneumonia) Active Problems:   GERD (gastroesophageal reflux disease)   HTN (hypertension)  Time spent: 25  Pamella Pert, MD [facility] Pager [phone]. If 7 PM - 7 AM, please contact night-coverage at [email or web address], password [email or web address] 12/12/2012, 11:23 AM  LOS: 2 days

## 2012-12-13 LAB — CULTURE, RESPIRATORY W GRAM STAIN: Culture: NORMAL

## 2012-12-13 LAB — CULTURE, RESPIRATORY

## 2012-12-13 LAB — BASIC METABOLIC PANEL
BUN: 8 mg/dL (ref 6–23)
CO2: 26 mEq/L (ref 19–32)
Calcium: 9.2 mg/dL (ref 8.4–10.5)
Chloride: 97 mEq/L (ref 96–112)
Creatinine, Ser: 0.67 mg/dL (ref 0.50–1.10)
GFR calc Af Amer: >90 mL/min (ref >90)
GFR calc non Af Amer: 87 mL/min — ABNORMAL LOW (ref >90)
Glucose, Bld: 125 mg/dL — ABNORMAL HIGH (ref 70–99)
Potassium: 3.5 mEq/L (ref 3.5–5.1)
Sodium: 132 mEq/L — ABNORMAL LOW (ref 135–145)

## 2012-12-13 LAB — CBC
HCT: 32.4 % — ABNORMAL LOW (ref 36.0–46.0)
Hemoglobin: 10.8 g/dL — ABNORMAL LOW (ref 12.0–15.0)
MCH: 28.6 pg (ref 26.0–34.0)
MCHC: 33.3 g/dL (ref 30.0–36.0)
MCV: 85.9 fL (ref 78.0–100.0)
Platelets: 536 10*3/uL — ABNORMAL HIGH (ref 150–400)
RBC: 3.77 MIL/uL — ABNORMAL LOW (ref 3.87–5.11)
RDW: 14 % (ref 11.5–15.5)
WBC: 15.2 10*3/uL — ABNORMAL HIGH (ref 4.0–10.5)

## 2012-12-13 LAB — OSMOLALITY: Osmolality: 274 mOsm/kg — ABNORMAL LOW (ref 275–300)

## 2012-12-13 MED ORDER — LEVOFLOXACIN 750 MG PO TABS: 750.0000 mg | ORAL_TABLET | Freq: Every day | ORAL | Status: DC

## 2012-12-13 MED ORDER — AMOXICILLIN-POT CLAVULANATE 875-125 MG PO TABS: 1.0000 | ORAL_TABLET | Freq: Two times a day (BID) | ORAL | Status: DC

## 2012-12-13 MED ORDER — GUAIFENESIN-DM 100-10 MG/5ML PO SYRP: 5.0000 mL | ORAL_SOLUTION | ORAL | Status: DC | PRN

## 2012-12-13 NOTE — Discharge Summary (Signed)
Physician Discharge Summary  Shelly Hickman:[id number] DOB: 06/28/1940 DOA: 12/10/2012  PCP: No primary provider on file.  Admit date: 12/10/2012 Discharge date: 12/13/2012  Time spent: > 35 minutes  Recommendations for Outpatient Follow-up:  1. Follow up with PCP in 1 week   Recommendations for primary care physician for things to follow:  Repeat CXR  Discharge Diagnoses:  Principal Problem:   HCAP (healthcare-associated pneumonia) Active Problems:   GERD (gastroesophageal reflux disease)   HTN (hypertension)  Discharge Condition: stable  Diet recommendation: regular  Filed Weights   12/10/12 1120  Weight: 85.594 kg (188 lb 11.2 oz)    History of present illness:  Shelly Hickman is a 72 y.o. female with PMH of HTN, GERD, HPL presented to PCP office with worsening SOB, productive cough with R sided chest pain and chills, fatigue; she was started on outpatient PO azithromycin 3 days ago for pneumonia; but symptoms progressive got worse; denies focal neurological symptoms, no nausea, vomiting or diarrhea;   Hospital Course:  Patient was admitted with community-acquired pneumonia that failed outpatient treatment. She was initially started on broad-spectrum vancomycin and aztreonam,then  transition to IV Unasyn and levofloxacin. CT scan of the chest showed right upper lobe airspace consolidation and possible early focus of necrosis or abscess (for full read below). Patient responded well to antibiotic treatment, her cough has improved, and she is breathing comfortably on room air. Her antibiotics were transitioned to oral Levofloxacin and oral Augmentin on 12/12/2012 and patient continued to improve. She tolerated the antibiotics well, without any nausea or vomiting, and without any diarrhea. She was counseled to followup with her primary care Dr. prior to finishing up her antibiotics for repeat imaging and to determine whether she will need a longer course than the one  prescribed. She is to finish up 10 additional days of antibiotics following discharge. She expressed understanding and said that she can get into her PCPs office later this week.  Procedures:  None   Consultations:  None  Discharge Exam: Filed Vitals:   12/12/12 0959 12/12/12 1500 12/12/12 2253 12/13/12 0610  BP: 123/80 128/73 125/77 127/72  Pulse:  102 101 100  Temp:  97.5 F (36.4 C) 98.5 F (36.9 C) 98.4 F (36.9 C)  TempSrc:  Oral Oral Oral  Resp:  18 18 18   Height:      Weight:      SpO2:  94% 90% 92%    General: No acute distress  Cardiovascular: Regular rate and rhythm  Respiratory: Clear to auscultation bilaterally, no wheezing or rales.  Discharge Instructions     Medication List    STOP taking these medications       azithromycin 250 MG tablet  Commonly known as:  ZITHROMAX      TAKE these medications       amLODipine 10 MG tablet  Commonly known as:  NORVASC  Take 10 mg by mouth daily.     amoxicillin-clavulanate 875-125 MG per tablet  Commonly known as:  AUGMENTIN  Take 1 tablet by mouth every 12 (twelve) hours.     aspirin EC 81 MG tablet  Take 81 mg by mouth daily.     celecoxib 200 MG capsule  Commonly known as:  CELEBREX  Take 200 mg by mouth 2 (two) times daily.     cholecalciferol 1000 UNITS tablet  Commonly known as:  VITAMIN D  Take 5,000 Units by mouth daily.     diclofenac sodium 1 % Gel  Commonly known as:  VOLTAREN  Apply 4 g topically 4 (four) times daily.     DULoxetine 30 MG capsule  Commonly known as:  CYMBALTA  Take 30 mg by mouth 2 (two) times daily.     fluticasone 50 MCG/ACT nasal spray  Commonly known as:  FLONASE  Place 2 sprays into the nose daily.     guaiFENesin-dextromethorphan 100-10 MG/5ML syrup  Commonly known as:  ROBITUSSIN DM  Take 5 mLs by mouth every 4 (four) hours as needed for cough.     HYDROcodone-acetaminophen 5-325 MG per tablet  Commonly known as:  NORCO/VICODIN  Take 1 tablet by  mouth daily as needed for pain.     HYDROCODONE-CHLORPHENIRAMINE PO  Take by mouth every 12 (twelve) hours.     levofloxacin 750 MG tablet  Commonly known as:  LEVAQUIN  Take 1 tablet (750 mg total) by mouth daily.     LORazepam 0.5 MG tablet  Commonly known as:  ATIVAN  Take 0.5 mg by mouth daily.     losartan-hydrochlorothiazide 100-25 MG per tablet  Commonly known as:  HYZAAR  Take 1 tablet by mouth daily.     montelukast 10 MG tablet  Commonly known as:  SINGULAIR  Take 10 mg by mouth at bedtime.     omega-3 acid ethyl esters 1 G capsule  Commonly known as:  LOVAZA  Take 2 g by mouth 2 (two) times daily.     simvastatin 10 MG tablet  Commonly known as:  ZOCOR  Take 10 mg by mouth at bedtime.     tobramycin 0.3 % ophthalmic solution  Commonly known as:  TOBREX  Place 1 drop into both eyes every 4 (four) hours as needed (eye irritation.).     traZODone 100 MG tablet  Commonly known as:  DESYREL  Take 50 mg by mouth at bedtime.           Follow-up Information   Follow up with PCP. Schedule an appointment as soon as possible for a visit in 1 week.      The results of significant diagnostics from this hospitalization (including imaging, microbiology, ancillary and laboratory) are listed below for reference.    Significant Diagnostic Studies: Dg Chest 2 View  12/07/2012   CLINICAL DATA:  Cough congestion and fever  EXAM: CHEST  2 VIEW  COMPARISON:  03/23/2008  FINDINGS: The heart size and mediastinal contours are within normal limits. Within the basilar portion of the right upper lobe there is of focal airspace consolidation identified. No pleural effusion or edema. Left lung is clear.Both lungs are clear. The visualized skeletal structures are unremarkable.  IMPRESSION: Right upper lobe airspace consolidation. In the acute setting this is compatible with pneumonia. Radiographic followup is advised to ensure resolution and to exclude underlying predisposing  malignancy.   Electronically Signed   By: Signa Kell M.D.   On: 12/07/2012 10:52   Ct Chest W Contrast  12/10/2012   CLINICAL DATA:  Pneumonia.  EXAM: CT CHEST WITH CONTRAST  TECHNIQUE: Multidetector CT imaging of the chest was performed during intravenous contrast administration.  CONTRAST:  80mL OMNIPAQUE IOHEXOL 300 MG/ML  SOLN  COMPARISON:  Chest x-ray 12/07/2012.  FINDINGS: The chest wall is unremarkable. No breast masses, supraclavicular or axillary adenopathy. The thyroid gland is normal. The bony thorax is intact. No destructive bone lesions or spinal canal compromise. Minimal degenerative changes involving the thoracic spine.  The heart is normal in size. No pericardial effusion. There arm  mildly enlarged right hilar lymph nodes and smaller pretracheal and subcarinal lymph nodes. The aorta is normal in caliber. No dissection. Minimal atherosclerotic calcifications. Coronary artery calcifications are also noted. The esophagus is grossly normal.  Dense right upper lobe airspace consolidation with surrounding pneumonitis. There are vessels running through the area of dense airspace consolidation and a few air bronchograms. I think this is most likely pneumonia and I do not see an obstructing mass or endobronchial lesion. There is a age rounded area of lower attenuation within this airspace consolidation which could be an early long abscess. There is an associated small right-sided parapneumonic effusion. No pulmonary lesions are nodules. The left lung is clear.  The upper abdomen is unremarkable.  IMPRESSION: Dense right upper lobe airspace consolidation with surrounding pneumonitis and possible early focus of necrosis or abscess. Associated right hilar and mediastinal lymphadenopathy. I believe this is most likely right upper lobe pneumonia and reactive adenopathy. Recommend aggressive antibiotic therapy and followup serial chest x-rays to make sure this resolves.   Electronically Signed   By: Loralie Champagne M.D.   On: 12/10/2012 20:27    Microbiology: Recent Results (from the past 240 hour(s))  CULTURE, BLOOD (ROUTINE X 2)     Status: None   Collection Time    12/10/12 10:00 AM      Result Value Range Status   Specimen Description BLOOD LEFT ARM   Final   Special Requests BOTTLES DRAWN AEROBIC AND ANAEROBIC 5CC   Final   Culture  Setup Time     Final   Value: 12/10/2012 20:44     Performed at [facility]   Culture     Final   Value:        BLOOD CULTURE RECEIVED NO GROWTH TO DATE CULTURE WILL BE HELD FOR 5 DAYS BEFORE ISSUING A FINAL NEGATIVE REPORT     Performed at [facility]   Report Status PENDING   Incomplete  URINE CULTURE     Status: None   Collection Time    12/10/12 10:32 AM      Result Value Range Status   Specimen Description URINE, RANDOM   Final   Special Requests NONE   Final   Culture  Setup Time     Final   Value: 12/10/2012 21:17     Performed at [facility] Count     Final   Value: 65,000 COLONIES/ML     Performed at [facility]   Culture     Final   Value: Multiple bacterial morphotypes present, none predominant. Suggest appropriate recollection if clinically indicated.     Performed at [facility]   Report Status 12/12/2012 FINAL   Final  CULTURE, BLOOD (ROUTINE X 2)     Status: None   Collection Time    12/10/12  3:00 PM      Result Value Range Status   Specimen Description BLOOD LEFT ARM   Final   Special Requests BOTTLES DRAWN AEROBIC AND ANAEROBIC 5CC   Final   Culture  Setup Time     Final   Value: 12/10/2012 20:44     Performed at [facility]   Culture     Final   Value:        BLOOD CULTURE RECEIVED NO GROWTH TO DATE CULTURE WILL BE HELD FOR 5 DAYS BEFORE ISSUING A FINAL NEGATIVE REPORT     Performed at [facility]  Report Status PENDING   Incomplete  CULTURE, EXPECTORATED SPUTUM-ASSESSMENT     Status: None   Collection Time    12/11/12 12:13 PM      Result  Value Range Status   Specimen Description SPUTUM   Final   Special Requests Normal   Final   Sputum evaluation     Final   Value: THIS SPECIMEN IS ACCEPTABLE. RESPIRATORY CULTURE REPORT TO FOLLOW.   Report Status 12/11/2012 FINAL   Final  CULTURE, RESPIRATORY (NON-EXPECTORATED)     Status: None   Collection Time    12/11/12 12:13 PM      Result Value Range Status   Specimen Description SPUTUM   Final   Special Requests NONE   Final   Gram Stain     Final   Value: RARE WBC PRESENT, PREDOMINANTLY PMN     NO SQUAMOUS EPITHELIAL CELLS SEEN     NO ORGANISMS SEEN     Performed at [facility]   Culture     Final   Value: NORMAL OROPHARYNGEAL FLORA     Performed at [facility]   Report Status PENDING   Incomplete     Labs: Basic Metabolic Panel:  Recent Labs Lab 12/10/12 1500 12/11/12 0051 12/12/12 0556 12/13/12 0541  NA 128* 128* 134* 132*  K 4.5 3.6 3.5 3.5  CL 91* 90* 97 97  CO2 31 27 27 26   GLUCOSE 165* 132* 126* 125*  BUN 13 13 8 8   CREATININE 0.90 0.77 0.65 0.67  CALCIUM 9.3 9.1 9.1 9.2   Liver Function Tests:  Recent Labs Lab 12/10/12 1500  AST 26  ALT 17  ALKPHOS 89  BILITOT 0.3  PROT 6.7  ALBUMIN 2.4*   CBC:  Recent Labs Lab 12/10/12 1500 12/11/12 0051 12/12/12 0556 12/13/12 0541  WBC 20.7* 21.4* 17.2* 15.2*  HGB 10.9* 10.7* 10.4* 10.8*  HCT 33.1* 31.4* 31.4* 32.4*  MCV 86.9 86.0 86.0 85.9  PLT 480* 513* 518* 536*   Cardiac Enzymes:  Recent Labs Lab 12/10/12 1500 12/10/12 1919 12/11/12 0051  TROPONINI <0.30 <0.30 <0.30    Signed:  Pamella Pert  [facility] 12/13/2012, 9:03 AM

## 2012-12-16 LAB — CULTURE, BLOOD (ROUTINE X 2)
Culture: NO GROWTH
Culture: NO GROWTH

## 2012-12-30 ENCOUNTER — Ambulatory Visit
Admission: RE | Admit: 2012-12-30 | Discharge: 2012-12-30 | Disposition: A | Discharge status: Home / Self Care | Payer: Medicare Other | Source: Ambulatory Visit | Attending: Family Medicine | Admitting: Family Medicine

## 2012-12-30 ENCOUNTER — Other Ambulatory Visit: Payer: Self-pay | Admitting: Family Medicine

## 2012-12-30 DIAGNOSIS — J189 Pneumonia, unspecified organism: Secondary | ICD-10-CM

## 2013-01-26 ENCOUNTER — Ambulatory Visit
Admission: RE | Admit: 2013-01-26 | Discharge: 2013-01-26 | Disposition: A | Discharge status: Home / Self Care | Payer: Medicare Other | Source: Ambulatory Visit | Attending: Family Medicine | Admitting: Family Medicine

## 2013-01-26 ENCOUNTER — Other Ambulatory Visit: Payer: Self-pay | Admitting: Family Medicine

## 2013-01-26 DIAGNOSIS — J189 Pneumonia, unspecified organism: Secondary | ICD-10-CM

## 2013-06-09 ENCOUNTER — Other Ambulatory Visit: Payer: Self-pay | Admitting: Orthopedic Surgery

## 2013-06-18 ENCOUNTER — Other Ambulatory Visit (HOSPITAL_COMMUNITY): Payer: Medicare Other

## 2013-06-30 ENCOUNTER — Ambulatory Visit: Payer: Self-pay | Admitting: Podiatry

## 2013-06-30 ENCOUNTER — Encounter (HOSPITAL_COMMUNITY): Payer: Self-pay

## 2013-06-30 NOTE — Pre-Procedure Instructions (Signed)
TOREE EDLING  06/30/2013   Your procedure is scheduled on:  Fri, May 22 @ 12:15 PM  Report to [facility]  at *10:15 AM.  Call this number if you have problems the morning of surgery: [phone]   Remember:   Do not eat food or drink liquids after midnight.   Take these medicines the morning of surgery with A SIP OF WATER: Amlodipine(Norvasc),Cymbalta(Duloxetine),Flonase(Fluticasone),Pain Pill(if needed),Ativan(Lorazepam-if needed),and Singulair(Montelukast)                  Stop taking your Aspirin and Lovaza. No Goody's,BC's,Aleve,Ibuprofen,or any Herbal Medications   Do not wear jewelry, make-up or nail polish.  Do not wear lotions, powders, or perfumes. You may wear deodorant.  Do not shave 48 hours prior to surgery.   Do not bring valuables to the hospital.  [facility] is not responsible                  for any belongings or valuables.               Contacts, dentures or bridgework may not be worn into surgery.  Leave suitcase in the car. After surgery it may be brought to your room.  For patients admitted to the hospital, discharge time is determined by your                treatment team.                  Special Instructions:  [place] - Preparing for Surgery  Before surgery, you can play an important role.  Because skin is not sterile, your skin needs to be as free of germs as possible.  You can reduce the number of germs on you skin by washing with CHG (chlorahexidine gluconate) soap before surgery.  CHG is an antiseptic cleaner which kills germs and bonds with the skin to continue killing germs even after washing.  Please DO NOT use if you have an allergy to CHG or antibacterial soaps.  If your skin becomes reddened/irritated stop using the CHG and inform your nurse when you arrive at Short Stay.  Do not shave (including legs and underarms) for at least 48 hours prior to the first CHG shower.  You may shave your face.  Please follow these instructions  carefully:   1.  Shower with CHG Soap the night before surgery and the                                morning of Surgery.  2.  If you choose to wash your hair, wash your hair first as usual with your       normal shampoo.  3.  After you shampoo, rinse your hair and body thoroughly to remove the                      Shampoo.  4.  Use CHG as you would any other liquid soap.  You can apply chg directly       to the skin and wash gently with scrungie or a clean washcloth.  5.  Apply the CHG Soap to your body ONLY FROM THE NECK DOWN.        Do not use on open wounds or open sores.  Avoid contact with your eyes,       ears, mouth and genitals (private parts).  Wash genitals (  private parts)       with your normal soap.  6.  Wash thoroughly, paying special attention to the area where your surgery        will be performed.  7.  Thoroughly rinse your body with warm water from the neck down.  8.  DO NOT shower/wash with your normal soap after using and rinsing off       the CHG Soap.  9.  Pat yourself dry with a clean towel.            10.  Wear clean pajamas.            11.  Place clean sheets on your bed the night of your first shower and do not        sleep with pets.  Day of Surgery  Do not apply any lotions/deoderants the morning of surgery.  Please wear clean clothes to the hospital/surgery center.     Please read over the following fact sheets that you were given: Pain Booklet, Coughing and Deep Breathing, Blood Transfusion Information, MRSA Information and Surgical Site Infection Prevention

## 2013-07-01 ENCOUNTER — Encounter (HOSPITAL_COMMUNITY): Payer: Self-pay

## 2013-07-01 ENCOUNTER — Encounter (HOSPITAL_COMMUNITY)
Admission: RE | Admit: 2013-07-01 | Discharge: 2013-07-01 | Disposition: A | Discharge status: Home / Self Care | Payer: Medicare HMO | Source: Ambulatory Visit | Attending: Orthopedic Surgery | Admitting: Orthopedic Surgery

## 2013-07-01 ENCOUNTER — Ambulatory Visit (HOSPITAL_COMMUNITY)
Admission: RE | Admit: 2013-07-01 | Discharge: 2013-07-01 | Disposition: A | Discharge status: Home / Self Care | Payer: Medicare HMO | Source: Ambulatory Visit | Attending: Anesthesiology | Admitting: Anesthesiology

## 2013-07-01 DIAGNOSIS — Z01818 Encounter for other preprocedural examination: Secondary | ICD-10-CM | POA: Insufficient documentation

## 2013-07-01 HISTORY — DX: Personal history of other diseases of the digestive system: Z87.19

## 2013-07-01 HISTORY — DX: Unspecified osteoarthritis, unspecified site: M19.90

## 2013-07-01 HISTORY — DX: Unspecified urinary incontinence: R32

## 2013-07-01 LAB — URINALYSIS, ROUTINE W REFLEX MICROSCOPIC
Bilirubin Urine: NEGATIVE
Glucose, UA: NEGATIVE mg/dL
Hgb urine dipstick: NEGATIVE
Ketones, ur: NEGATIVE mg/dL
Leukocytes, UA: NEGATIVE
Nitrite: NEGATIVE
Protein, ur: NEGATIVE mg/dL
Specific Gravity, Urine: 1.018 (ref 1.005–1.030)
Urobilinogen, UA: 0.2 mg/dL (ref 0.0–1.0)
pH: 6 (ref 5.0–8.0)

## 2013-07-01 LAB — APTT: aPTT: 30 seconds (ref 24–37)

## 2013-07-01 LAB — CBC WITH DIFFERENTIAL/PLATELET
Basophils Absolute: 0 10*3/uL (ref 0.0–0.1)
Basophils Relative: 0 % (ref 0–1)
Eosinophils Absolute: 0.7 10*3/uL (ref 0.0–0.7)
Eosinophils Relative: 7 % — ABNORMAL HIGH (ref 0–5)
HCT: 39.5 % (ref 36.0–46.0)
Hemoglobin: 12.9 g/dL (ref 12.0–15.0)
Lymphocytes Relative: 26 % (ref 12–46)
Lymphs Abs: 2.6 10*3/uL (ref 0.7–4.0)
MCH: 28.9 pg (ref 26.0–34.0)
MCHC: 32.7 g/dL (ref 30.0–36.0)
MCV: 88.6 fL (ref 78.0–100.0)
Monocytes Absolute: 0.7 10*3/uL (ref 0.1–1.0)
Monocytes Relative: 7 % (ref 3–12)
Neutro Abs: 5.9 10*3/uL (ref 1.7–7.7)
Neutrophils Relative %: 60 % (ref 43–77)
Platelets: 399 10*3/uL (ref 150–400)
RBC: 4.46 MIL/uL (ref 3.87–5.11)
RDW: 15.8 % — ABNORMAL HIGH (ref 11.5–15.5)
WBC: 10 10*3/uL (ref 4.0–10.5)

## 2013-07-01 LAB — BASIC METABOLIC PANEL
BUN: 18 mg/dL (ref 6–23)
CO2: 31 mEq/L (ref 19–32)
Calcium: 9.5 mg/dL (ref 8.4–10.5)
Chloride: 101 mEq/L (ref 96–112)
Creatinine, Ser: 0.86 mg/dL (ref 0.50–1.10)
GFR calc Af Amer: 77 mL/min — ABNORMAL LOW (ref >90)
GFR calc non Af Amer: 66 mL/min — ABNORMAL LOW (ref >90)
Glucose, Bld: 100 mg/dL — ABNORMAL HIGH (ref 70–99)
Potassium: 3.8 mEq/L (ref 3.7–5.3)
Sodium: 141 mEq/L (ref 137–147)

## 2013-07-01 LAB — TYPE AND SCREEN
ABO/RH(D): B POS
Antibody Screen: NEGATIVE

## 2013-07-01 LAB — PROTIME-INR
INR: 0.92 (ref 0.00–1.49)
Prothrombin Time: 12.2 seconds (ref 11.6–15.2)

## 2013-07-01 LAB — SURGICAL PCR SCREEN
MRSA, PCR: NEGATIVE
Staphylococcus aureus: NEGATIVE

## 2013-07-01 NOTE — Progress Notes (Signed)
Pt. Reports that she had pneumonia in 11/2012, that is when she had last EKG.  Reports that she had a stress test approx. 5 yrs. Ago, unsure where it was done, told that it was WNL.

## 2013-07-08 ENCOUNTER — Ambulatory Visit (INDEPENDENT_AMBULATORY_CARE_PROVIDER_SITE_OTHER): Payer: Commercial Managed Care - HMO | Admitting: Podiatry

## 2013-07-08 VITALS — BP 152/82 | HR 82 | Resp 16

## 2013-07-08 DIAGNOSIS — G5760 Lesion of plantar nerve, unspecified lower limb: Secondary | ICD-10-CM

## 2013-07-08 DIAGNOSIS — M1612 Unilateral primary osteoarthritis, left hip: Secondary | ICD-10-CM | POA: Diagnosis present

## 2013-07-08 MED ORDER — VANCOMYCIN HCL IN DEXTROSE 1-5 GM/200ML-% IV SOLN
1000.0000 mg | INTRAVENOUS | Status: AC
  Administered 2013-07-09: 1000 mg via INTRAVENOUS
  Filled 2013-07-08: qty 200

## 2013-07-08 MED ORDER — CHLORHEXIDINE GLUCONATE 4 % EX LIQD
60.0000 mL | Freq: Once | CUTANEOUS | Status: DC
  Filled 2013-07-08: qty 60

## 2013-07-08 NOTE — Progress Notes (Signed)
   Subjective:    Patient ID: Shelly Hickman, female    DOB: 10/09/1940, 73 y.o.   MRN: [id number]  HPI  Pt requesting injection left foot pain, outer foot  Review of Systems     Objective:   Physical Exam        Assessment & Plan:

## 2013-07-08 NOTE — H&P (Signed)
TOTAL HIP ADMISSION H&P  Patient is admitted for left total hip arthroplasty.  Subjective:  Chief Complaint: left hip pain  HPI: Shelly Hickman, 73 y.o. female, has a history of pain and functional disability in the left hip(s) due to arthritis and patient has failed non-surgical conservative treatments for greater than 12 weeks to include NSAID's and/or analgesics, corticosteriod injections, weight reduction as appropriate and activity modification.  Onset of symptoms was gradual starting 1 years ago with rapidlly worsening course since that time.The patient noted no past surgery on the left hip(s).  Patient currently rates pain in the left hip at 10 out of 10 with activity. Patient has night pain, worsening of pain with activity and weight bearing, pain that interfers with activities of daily living and pain with passive range of motion. Patient has evidence of subchondral sclerosis and joint space narrowing by imaging studies. This condition presents safety issues increasing the risk of falls.  There is no current active infection.  Patient Active Problem List   Diagnosis Date Noted  . HCAP (healthcare-associated pneumonia) 12/10/2012  . GERD (gastroesophageal reflux disease) 12/10/2012  . HTN (hypertension) 12/10/2012   Past Medical History  Diagnosis Date  . Hypertension   . Hypercholesterolemia   . Depression   . Anxiety   . Anemia   . Pneumonia     11/2012- hosp. at [facility]  . H/O hiatal hernia   . Arthritis     "entire body"  . Incontinence of urine     Past Surgical History  Procedure Laterality Date  . Right knee arthroscopy    . Abdominal hysterectomy    . 2 synovial cysts removed between l4-l5    . Tonsillectomy    . Back surgery      back fusion after 2 back surgeries   . Eye surgery      tube inserted for excessive tearing , tube is removed     No prescriptions prior to admission   Allergies  Allergen Reactions  . Avelox [Moxifloxacin Hcl In Nacl] Other (See  Comments)    Chest pain  . Cephalosporins Hives    History  Substance Use Topics  . Smoking status: Former Smoker    Types: Cigarettes    Quit date: 12/11/1962  . Smokeless tobacco: Never Used  . Alcohol Use: No    Family History  Problem Relation Age of Onset  . Hypertension Mother   . Alcoholism Father      Review of Systems  Constitutional: Negative.   HENT: Positive for hearing loss.   Eyes: Negative.        Glasses  Respiratory: Negative.   Cardiovascular: Negative.   Gastrointestinal: Negative.   Genitourinary: Negative.   Musculoskeletal: Positive for joint pain.  Skin: Negative.   Neurological: Negative.   Endo/Heme/Allergies: Bruises/bleeds easily.  Psychiatric/Behavioral: Positive for depression.    Objective:  Physical Exam  Constitutional: She is oriented to person, place, and time. She appears well-developed and well-nourished.  HENT:  Head: Normocephalic and atraumatic.  Eyes: Pupils are equal, round, and reactive to light.  Neck: Normal range of motion. Neck supple.  Cardiovascular: Intact distal pulses.   Respiratory: Effort normal.  Musculoskeletal:  Patient has good strength good range of motion in the right hip.  No pain with hip flexion extension internal/external rotation or log roll.  Patient's left hip has mild irritation with internal and external rotation.  She states is much worse if she did not recently have the injection.  She has no pain laterally over the greater trochanteric region.  Mild groin pain with palpation.  Negative straight leg raise.  She is neurovascularly intact distally.  Her calves are soft and nontender.  Neurological: She is alert and oriented to person, place, and time.  Skin: Skin is warm and dry.  Psychiatric: She has a normal mood and affect. Her behavior is normal. Judgment and thought content normal.    Vital signs in last 24 hours: Pulse Rate:  [82] 82 (05/21 0926) Resp:  [16] 16 (05/21 0926) BP: (152)/(82)  152/82 mmHg (05/21 0926)  Labs:   Estimated body mass index is 30.47 kg/(m^2) as calculated from the following:   Height as of 12/10/12: 5\' 6"  (1.676 m).   Weight as of 12/10/12: 85.594 kg (188 lb 11.2 oz).   Imaging Review Radiographs:  X-rays were reviewed by myself and Dr. Mayer Camel included; AP of the pelvis and one view of the left hip.  Patient does have end-stage arthritis with bone-on-bone arthritis and sclerosis of the surrounding bone of the left hip.   Assessment/Plan:  End stage arthritis, left hip(s)  The patient history, physical examination, clinical judgement of the provider and imaging studies are consistent with end stage degenerative joint disease of the left hip(s) and total hip arthroplasty is deemed medically necessary. The treatment options including medical management, injection therapy, arthroscopy and arthroplasty were discussed at length. The risks and benefits of total hip arthroplasty were presented and reviewed. The risks due to aseptic loosening, infection, stiffness, dislocation/subluxation,  thromboembolic complications and other imponderables were discussed.  The patient acknowledged the explanation, agreed to proceed with the plan and consent was signed. Patient is being admitted for inpatient treatment for surgery, pain control, PT, OT, prophylactic antibiotics, VTE prophylaxis, progressive ambulation and ADL's and discharge planning.The patient is planning to be discharged to skilled nursing facility

## 2013-07-08 NOTE — Progress Notes (Signed)
Subjective:     Patient ID: Shelly Hickman, female   DOB: [birth year], 73 y.o.   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

## 2013-07-09 ENCOUNTER — Encounter (HOSPITAL_COMMUNITY): Payer: Self-pay | Admitting: *Deleted

## 2013-07-09 ENCOUNTER — Inpatient Hospital Stay (HOSPITAL_COMMUNITY)
Admission: RE | Admit: 2013-07-09 | Discharge: 2013-07-13 | DRG: 470 | Disposition: A | Discharge status: Skilled Nursing Facility | Payer: Medicare HMO | Source: Ambulatory Visit | Attending: Orthopedic Surgery | Admitting: Orthopedic Surgery

## 2013-07-09 ENCOUNTER — Encounter (HOSPITAL_COMMUNITY): Payer: Medicare HMO | Admitting: Anesthesiology

## 2013-07-09 ENCOUNTER — Inpatient Hospital Stay (HOSPITAL_COMMUNITY): Payer: Medicare HMO | Admitting: Anesthesiology

## 2013-07-09 ENCOUNTER — Inpatient Hospital Stay (HOSPITAL_COMMUNITY): Payer: Medicare HMO

## 2013-07-09 ENCOUNTER — Encounter (HOSPITAL_COMMUNITY)
Admission: RE | Disposition: A | Discharge status: Skilled Nursing Facility | Payer: Self-pay | Source: Ambulatory Visit | Attending: Orthopedic Surgery

## 2013-07-09 DIAGNOSIS — H9190 Unspecified hearing loss, unspecified ear: Secondary | ICD-10-CM | POA: Diagnosis present

## 2013-07-09 DIAGNOSIS — K219 Gastro-esophageal reflux disease without esophagitis: Secondary | ICD-10-CM | POA: Diagnosis present

## 2013-07-09 DIAGNOSIS — R410 Disorientation, unspecified: Secondary | ICD-10-CM | POA: Diagnosis not present

## 2013-07-09 DIAGNOSIS — F411 Generalized anxiety disorder: Secondary | ICD-10-CM | POA: Diagnosis present

## 2013-07-09 DIAGNOSIS — IMO0002 Reserved for concepts with insufficient information to code with codable children: Secondary | ICD-10-CM | POA: Diagnosis not present

## 2013-07-09 DIAGNOSIS — Z6379 Other stressful life events affecting family and household: Secondary | ICD-10-CM

## 2013-07-09 DIAGNOSIS — Z8249 Family history of ischemic heart disease and other diseases of the circulatory system: Secondary | ICD-10-CM

## 2013-07-09 DIAGNOSIS — F3289 Other specified depressive episodes: Secondary | ICD-10-CM | POA: Diagnosis present

## 2013-07-09 DIAGNOSIS — R031 Nonspecific low blood-pressure reading: Secondary | ICD-10-CM | POA: Diagnosis not present

## 2013-07-09 DIAGNOSIS — R443 Hallucinations, unspecified: Secondary | ICD-10-CM | POA: Diagnosis not present

## 2013-07-09 DIAGNOSIS — Z7982 Long term (current) use of aspirin: Secondary | ICD-10-CM

## 2013-07-09 DIAGNOSIS — Z79899 Other long term (current) drug therapy: Secondary | ICD-10-CM

## 2013-07-09 DIAGNOSIS — F329 Major depressive disorder, single episode, unspecified: Secondary | ICD-10-CM | POA: Diagnosis present

## 2013-07-09 DIAGNOSIS — Z881 Allergy status to other antibiotic agents status: Secondary | ICD-10-CM

## 2013-07-09 DIAGNOSIS — Z981 Arthrodesis status: Secondary | ICD-10-CM

## 2013-07-09 DIAGNOSIS — M1610 Unilateral primary osteoarthritis, unspecified hip: Principal | ICD-10-CM | POA: Diagnosis present

## 2013-07-09 DIAGNOSIS — M169 Osteoarthritis of hip, unspecified: Principal | ICD-10-CM | POA: Diagnosis present

## 2013-07-09 DIAGNOSIS — E7800 Pure hypercholesterolemia, unspecified: Secondary | ICD-10-CM | POA: Diagnosis present

## 2013-07-09 DIAGNOSIS — M1612 Unilateral primary osteoarthritis, left hip: Secondary | ICD-10-CM | POA: Diagnosis present

## 2013-07-09 DIAGNOSIS — I10 Essential (primary) hypertension: Secondary | ICD-10-CM | POA: Diagnosis present

## 2013-07-09 DIAGNOSIS — F05 Delirium due to known physiological condition: Secondary | ICD-10-CM | POA: Diagnosis not present

## 2013-07-09 DIAGNOSIS — Z87891 Personal history of nicotine dependence: Secondary | ICD-10-CM

## 2013-07-09 HISTORY — PX: TOTAL HIP ARTHROPLASTY: SHX124

## 2013-07-09 SURGERY — ARTHROPLASTY, HIP, TOTAL,POSTERIOR APPROACH
Anesthesia: General | Site: Hip | Laterality: Left

## 2013-07-09 MED ORDER — OXYCODONE-ACETAMINOPHEN 5-325 MG PO TABS: 1.0000 | ORAL_TABLET | ORAL | Status: DC | PRN

## 2013-07-09 MED ORDER — AMLODIPINE BESYLATE 5 MG PO TABS
5.0000 mg | ORAL_TABLET | Freq: Every day | ORAL | Status: DC
  Administered 2013-07-10 – 2013-07-12 (×2): 5 mg via ORAL
  Filled 2013-07-09 (×4): qty 1

## 2013-07-09 MED ORDER — METHOCARBAMOL 500 MG PO TABS
500.0000 mg | ORAL_TABLET | Freq: Four times a day (QID) | ORAL | Status: DC | PRN
  Administered 2013-07-09 – 2013-07-13 (×6): 500 mg via ORAL
  Filled 2013-07-09 (×6): qty 1

## 2013-07-09 MED ORDER — SENNOSIDES-DOCUSATE SODIUM 8.6-50 MG PO TABS: 1.0000 | ORAL_TABLET | Freq: Every evening | ORAL | Status: DC | PRN

## 2013-07-09 MED ORDER — AQUAPHOR EX OINT
1.0000 "application " | TOPICAL_OINTMENT | CUTANEOUS | Status: DC | PRN
  Filled 2013-07-09: qty 50

## 2013-07-09 MED ORDER — HYDROMORPHONE HCL PF 1 MG/ML IJ SOLN
0.5000 mg | INTRAMUSCULAR | Status: DC | PRN
  Administered 2013-07-09: 1 mg via INTRAVENOUS
  Filled 2013-07-09: qty 1

## 2013-07-09 MED ORDER — ONDANSETRON HCL 4 MG/2ML IJ SOLN: 4.0000 mg | Freq: Once | INTRAMUSCULAR | Status: DC | PRN

## 2013-07-09 MED ORDER — OXYCODONE HCL 5 MG PO TABS
5.0000 mg | ORAL_TABLET | ORAL | Status: DC | PRN
  Administered 2013-07-09 – 2013-07-12 (×11): 10 mg via ORAL
  Filled 2013-07-09 (×11): qty 2

## 2013-07-09 MED ORDER — BISACODYL 5 MG PO TBEC: 5.0000 mg | DELAYED_RELEASE_TABLET | Freq: Every day | ORAL | Status: DC | PRN

## 2013-07-09 MED ORDER — ACETAMINOPHEN 325 MG PO TABS
650.0000 mg | ORAL_TABLET | Freq: Four times a day (QID) | ORAL | Status: DC | PRN
  Administered 2013-07-11: 650 mg via ORAL
  Filled 2013-07-09: qty 2

## 2013-07-09 MED ORDER — FENTANYL CITRATE 0.05 MG/ML IJ SOLN
INTRAMUSCULAR | Status: AC
Start: 2013-07-09 — End: 2013-07-09
  Filled 2013-07-09: qty 5

## 2013-07-09 MED ORDER — OMEGA-3-ACID ETHYL ESTERS 1 G PO CAPS
1.0000 g | ORAL_CAPSULE | Freq: Two times a day (BID) | ORAL | Status: DC
Start: 2013-07-09 — End: 2013-07-13
  Administered 2013-07-09 – 2013-07-13 (×8): 1 g via ORAL
  Filled 2013-07-09 (×9): qty 1

## 2013-07-09 MED ORDER — ACETAMINOPHEN 650 MG RE SUPP: 650.0000 mg | Freq: Four times a day (QID) | RECTAL | Status: DC | PRN

## 2013-07-09 MED ORDER — PROPOFOL 10 MG/ML IV BOLUS
INTRAVENOUS | Status: DC | PRN
  Administered 2013-07-09: 150 mg via INTRAVENOUS

## 2013-07-09 MED ORDER — LABETALOL HCL 5 MG/ML IV SOLN
INTRAVENOUS | Status: AC
  Filled 2013-07-09: qty 4

## 2013-07-09 MED ORDER — ONDANSETRON HCL 4 MG/2ML IJ SOLN
INTRAMUSCULAR | Status: AC
  Filled 2013-07-09: qty 2

## 2013-07-09 MED ORDER — SODIUM CHLORIDE 0.9 % IR SOLN
Status: DC | PRN
  Administered 2013-07-09: 1000 mL

## 2013-07-09 MED ORDER — LIDOCAINE HCL (CARDIAC) 20 MG/ML IV SOLN
INTRAVENOUS | Status: AC
  Filled 2013-07-09: qty 5

## 2013-07-09 MED ORDER — HYDROCHLOROTHIAZIDE 25 MG PO TABS
25.0000 mg | ORAL_TABLET | Freq: Every day | ORAL | Status: DC
  Administered 2013-07-09 – 2013-07-12 (×3): 25 mg via ORAL
  Filled 2013-07-09 (×4): qty 1

## 2013-07-09 MED ORDER — PHENYLEPHRINE HCL 10 MG/ML IJ SOLN
INTRAMUSCULAR | Status: DC | PRN
  Administered 2013-07-09 (×3): 80 ug via INTRAVENOUS

## 2013-07-09 MED ORDER — ONDANSETRON HCL 4 MG PO TABS: 4.0000 mg | ORAL_TABLET | Freq: Four times a day (QID) | ORAL | Status: DC | PRN

## 2013-07-09 MED ORDER — SIMVASTATIN 10 MG PO TABS
10.0000 mg | ORAL_TABLET | Freq: Every day | ORAL | Status: DC
  Administered 2013-07-09 – 2013-07-12 (×4): 10 mg via ORAL
  Filled 2013-07-09 (×6): qty 1

## 2013-07-09 MED ORDER — METOCLOPRAMIDE HCL 5 MG/ML IJ SOLN: 5.0000 mg | Freq: Three times a day (TID) | INTRAMUSCULAR | Status: DC | PRN

## 2013-07-09 MED ORDER — LORAZEPAM 0.5 MG PO TABS: 0.5000 mg | ORAL_TABLET | Freq: Every evening | ORAL | Status: DC | PRN

## 2013-07-09 MED ORDER — KCL IN DEXTROSE-NACL 20-5-0.45 MEQ/L-%-% IV SOLN
INTRAVENOUS | Status: DC
  Administered 2013-07-09 – 2013-07-10 (×2): 125 mL/h via INTRAVENOUS
  Filled 2013-07-09 (×11): qty 1000

## 2013-07-09 MED ORDER — METOCLOPRAMIDE HCL 10 MG PO TABS: 5.0000 mg | ORAL_TABLET | Freq: Three times a day (TID) | ORAL | Status: DC | PRN

## 2013-07-09 MED ORDER — GLYCOPYRROLATE 0.2 MG/ML IJ SOLN
INTRAMUSCULAR | Status: DC | PRN
  Administered 2013-07-09: 0.4 mg via INTRAVENOUS

## 2013-07-09 MED ORDER — PHENYLEPHRINE 40 MCG/ML (10ML) SYRINGE FOR IV PUSH (FOR BLOOD PRESSURE SUPPORT)
PREFILLED_SYRINGE | INTRAVENOUS | Status: AC
  Filled 2013-07-09: qty 10

## 2013-07-09 MED ORDER — DEXTROSE-NACL 5-0.45 % IV SOLN: INTRAVENOUS | Status: DC

## 2013-07-09 MED ORDER — MIDAZOLAM HCL 2 MG/2ML IJ SOLN
INTRAMUSCULAR | Status: AC
  Filled 2013-07-09: qty 2

## 2013-07-09 MED ORDER — ASPIRIN EC 325 MG PO TBEC
325.0000 mg | DELAYED_RELEASE_TABLET | Freq: Every day | ORAL | Status: DC
  Administered 2013-07-10 – 2013-07-13 (×4): 325 mg via ORAL
  Filled 2013-07-09 (×5): qty 1

## 2013-07-09 MED ORDER — MIDAZOLAM HCL 5 MG/5ML IJ SOLN
INTRAMUSCULAR | Status: DC | PRN
  Administered 2013-07-09: 2 mg via INTRAVENOUS

## 2013-07-09 MED ORDER — FLUTICASONE PROPIONATE 50 MCG/ACT NA SUSP
2.0000 | Freq: Every day | NASAL | Status: DC
  Administered 2013-07-10 – 2013-07-13 (×4): 2 via NASAL
  Filled 2013-07-09: qty 16

## 2013-07-09 MED ORDER — OXYCODONE HCL 5 MG/5ML PO SOLN: 5.0000 mg | Freq: Once | ORAL | Status: DC | PRN

## 2013-07-09 MED ORDER — ROCURONIUM BROMIDE 50 MG/5ML IV SOLN
INTRAVENOUS | Status: AC
  Filled 2013-07-09: qty 1

## 2013-07-09 MED ORDER — MENTHOL 3 MG MT LOZG: 1.0000 | LOZENGE | OROMUCOSAL | Status: DC | PRN

## 2013-07-09 MED ORDER — FENTANYL CITRATE 0.05 MG/ML IJ SOLN
INTRAMUSCULAR | Status: AC
  Filled 2013-07-09: qty 5

## 2013-07-09 MED ORDER — METHOCARBAMOL 1000 MG/10ML IJ SOLN
500.0000 mg | Freq: Four times a day (QID) | INTRAVENOUS | Status: DC | PRN
  Filled 2013-07-09: qty 5

## 2013-07-09 MED ORDER — LABETALOL HCL 5 MG/ML IV SOLN
INTRAVENOUS | Status: DC | PRN
  Administered 2013-07-09: 10 mg via INTRAVENOUS
  Administered 2013-07-09: 5 mg via INTRAVENOUS

## 2013-07-09 MED ORDER — ALUMINUM HYDROXIDE GEL 320 MG/5ML PO SUSP
15.0000 mL | ORAL | Status: DC | PRN
Start: 2013-07-09 — End: 2013-07-13
  Filled 2013-07-09: qty 30

## 2013-07-09 MED ORDER — TRANEXAMIC ACID 100 MG/ML IV SOLN
1000.0000 mg | INTRAVENOUS | Status: DC
  Filled 2013-07-09: qty 10

## 2013-07-09 MED ORDER — OXYCODONE HCL 5 MG PO TABS: 5.0000 mg | ORAL_TABLET | Freq: Once | ORAL | Status: DC | PRN

## 2013-07-09 MED ORDER — LACTATED RINGERS IV SOLN
INTRAVENOUS | Status: DC | PRN
  Administered 2013-07-09 (×2): via INTRAVENOUS

## 2013-07-09 MED ORDER — FENTANYL CITRATE 0.05 MG/ML IJ SOLN
INTRAMUSCULAR | Status: DC | PRN
  Administered 2013-07-09 (×2): 100 ug via INTRAVENOUS
  Administered 2013-07-09: 150 ug via INTRAVENOUS
  Administered 2013-07-09: 50 ug via INTRAVENOUS
  Administered 2013-07-09: 100 ug via INTRAVENOUS

## 2013-07-09 MED ORDER — MONTELUKAST SODIUM 10 MG PO TABS
10.0000 mg | ORAL_TABLET | Freq: Every day | ORAL | Status: DC
  Administered 2013-07-10 – 2013-07-13 (×4): 10 mg via ORAL
  Filled 2013-07-09 (×5): qty 1

## 2013-07-09 MED ORDER — ROCURONIUM BROMIDE 100 MG/10ML IV SOLN
INTRAVENOUS | Status: DC | PRN
  Administered 2013-07-09: 50 mg via INTRAVENOUS

## 2013-07-09 MED ORDER — TRANEXAMIC ACID 100 MG/ML IV SOLN
100.0000 mg | INTRAVENOUS | Status: DC | PRN
  Administered 2013-07-09: 1000 mg via INTRAVENOUS

## 2013-07-09 MED ORDER — DIPHENHYDRAMINE HCL 12.5 MG/5ML PO ELIX
12.5000 mg | ORAL_SOLUTION | ORAL | Status: DC | PRN
Start: 2013-07-09 — End: 2013-07-12

## 2013-07-09 MED ORDER — EPHEDRINE SULFATE 50 MG/ML IJ SOLN
INTRAMUSCULAR | Status: AC
  Filled 2013-07-09: qty 1

## 2013-07-09 MED ORDER — HYDROMORPHONE HCL PF 1 MG/ML IJ SOLN
INTRAMUSCULAR | Status: AC
  Filled 2013-07-09: qty 1

## 2013-07-09 MED ORDER — LIDOCAINE HCL (CARDIAC) 20 MG/ML IV SOLN
INTRAVENOUS | Status: DC | PRN
  Administered 2013-07-09: 80 mg via INTRAVENOUS

## 2013-07-09 MED ORDER — DOCUSATE SODIUM 100 MG PO CAPS
100.0000 mg | ORAL_CAPSULE | Freq: Two times a day (BID) | ORAL | Status: DC
  Administered 2013-07-09 – 2013-07-13 (×8): 100 mg via ORAL
  Filled 2013-07-09 (×9): qty 1

## 2013-07-09 MED ORDER — NEOSTIGMINE METHYLSULFATE 10 MG/10ML IV SOLN
INTRAVENOUS | Status: AC
  Filled 2013-07-09: qty 1

## 2013-07-09 MED ORDER — GLYCOPYRROLATE 0.2 MG/ML IJ SOLN
INTRAMUSCULAR | Status: AC
  Filled 2013-07-09: qty 2

## 2013-07-09 MED ORDER — NEOSTIGMINE METHYLSULFATE 10 MG/10ML IV SOLN
INTRAVENOUS | Status: DC | PRN
  Administered 2013-07-09: 3 mg via INTRAVENOUS

## 2013-07-09 MED ORDER — DULOXETINE HCL 60 MG PO CPEP
120.0000 mg | ORAL_CAPSULE | Freq: Every day | ORAL | Status: DC
  Administered 2013-07-10 – 2013-07-13 (×4): 120 mg via ORAL
  Filled 2013-07-09 (×6): qty 2

## 2013-07-09 MED ORDER — LOSARTAN POTASSIUM-HCTZ 100-25 MG PO TABS: 1.0000 | ORAL_TABLET | Freq: Every day | ORAL | Status: DC

## 2013-07-09 MED ORDER — HYDROMORPHONE HCL PF 1 MG/ML IJ SOLN
0.2500 mg | INTRAMUSCULAR | Status: DC | PRN
  Administered 2013-07-09 (×4): 0.5 mg via INTRAVENOUS

## 2013-07-09 MED ORDER — TRAZODONE HCL 100 MG PO TABS
200.0000 mg | ORAL_TABLET | Freq: Every day | ORAL | Status: DC
  Administered 2013-07-09 – 2013-07-11 (×3): 200 mg via ORAL
  Filled 2013-07-09 (×4): qty 2

## 2013-07-09 MED ORDER — BUPIVACAINE-EPINEPHRINE 0.5% -1:200000 IJ SOLN
INTRAMUSCULAR | Status: DC | PRN
  Administered 2013-07-09: 20 mL

## 2013-07-09 MED ORDER — ONDANSETRON HCL 4 MG/2ML IJ SOLN: 4.0000 mg | Freq: Four times a day (QID) | INTRAMUSCULAR | Status: DC | PRN

## 2013-07-09 MED ORDER — EPHEDRINE SULFATE 50 MG/ML IJ SOLN
INTRAMUSCULAR | Status: DC | PRN
  Administered 2013-07-09: 10 mg via INTRAVENOUS
  Administered 2013-07-09: 20 mg via INTRAVENOUS

## 2013-07-09 MED ORDER — PROPOFOL 10 MG/ML IV BOLUS
INTRAVENOUS | Status: AC
  Filled 2013-07-09: qty 20

## 2013-07-09 MED ORDER — LACTATED RINGERS IV SOLN
INTRAVENOUS | Status: DC
Start: 2013-07-09 — End: 2013-07-09
  Administered 2013-07-09: 11:00:00 via INTRAVENOUS

## 2013-07-09 MED ORDER — ASPIRIN EC 325 MG PO TBEC: 325.0000 mg | DELAYED_RELEASE_TABLET | Freq: Two times a day (BID) | ORAL | Status: DC

## 2013-07-09 MED ORDER — TIZANIDINE HCL 2 MG PO CAPS: 2.0000 mg | ORAL_CAPSULE | Freq: Three times a day (TID) | ORAL | Status: DC

## 2013-07-09 MED ORDER — BUPIVACAINE-EPINEPHRINE (PF) 0.5% -1:200000 IJ SOLN
INTRAMUSCULAR | Status: AC
  Filled 2013-07-09: qty 30

## 2013-07-09 MED ORDER — FLEET ENEMA 7-19 GM/118ML RE ENEM: 1.0000 | ENEMA | Freq: Once | RECTAL | Status: AC | PRN

## 2013-07-09 MED ORDER — PHENOL 1.4 % MT LIQD
1.0000 | OROMUCOSAL | Status: DC | PRN
  Filled 2013-07-09: qty 177

## 2013-07-09 MED ORDER — LOSARTAN POTASSIUM 50 MG PO TABS
100.0000 mg | ORAL_TABLET | Freq: Every day | ORAL | Status: DC
  Administered 2013-07-10 – 2013-07-12 (×2): 100 mg via ORAL
  Filled 2013-07-09 (×4): qty 2

## 2013-07-09 SURGICAL SUPPLY — 53 items
BLADE SAW SGTL 18X1.27X75 (BLADE) ×2 IMPLANT
BLADE SAW SGTL 18X1.27X75MM (BLADE) ×1
BRUSH FEMORAL CANAL (MISCELLANEOUS) IMPLANT
CAPT HIP PF MOP ×2 IMPLANT
COVER BACK TABLE 24X17X13 BIG (DRAPES) IMPLANT
COVER SURGICAL LIGHT HANDLE (MISCELLANEOUS) ×6 IMPLANT
DRAPE ORTHO SPLIT 77X108 STRL (DRAPES) ×3
DRAPE PROXIMA HALF (DRAPES) ×3 IMPLANT
DRAPE SURG ORHT 6 SPLT 77X108 (DRAPES) ×1 IMPLANT
DRAPE U-SHAPE 47X51 STRL (DRAPES) ×3 IMPLANT
DRILL BIT 7/64X5 (BIT) ×3 IMPLANT
DRSG AQUACEL AG ADV 3.5X10 (GAUZE/BANDAGES/DRESSINGS) ×3 IMPLANT
DURAPREP 26ML APPLICATOR (WOUND CARE) ×3 IMPLANT
ELECT BLADE 4.0 EZ CLEAN MEGAD (MISCELLANEOUS)
ELECT REM PT RETURN 9FT ADLT (ELECTROSURGICAL) ×3
ELECTRODE BLDE 4.0 EZ CLN MEGD (MISCELLANEOUS) IMPLANT
ELECTRODE REM PT RTRN 9FT ADLT (ELECTROSURGICAL) ×1 IMPLANT
GAUZE XEROFORM 1X8 LF (GAUZE/BANDAGES/DRESSINGS) ×3 IMPLANT
GLOVE BIO SURGEON STRL SZ7.5 (GLOVE) ×3 IMPLANT
GLOVE BIO SURGEON STRL SZ8.5 (GLOVE) ×6 IMPLANT
GLOVE BIOGEL PI IND STRL 8 (GLOVE) ×2 IMPLANT
GLOVE BIOGEL PI IND STRL 9 (GLOVE) ×1 IMPLANT
GLOVE BIOGEL PI INDICATOR 8 (GLOVE) ×4
GLOVE BIOGEL PI INDICATOR 9 (GLOVE) ×2
GOWN STRL REUS W/ TWL LRG LVL3 (GOWN DISPOSABLE) ×2 IMPLANT
GOWN STRL REUS W/ TWL XL LVL3 (GOWN DISPOSABLE) ×3 IMPLANT
GOWN STRL REUS W/TWL LRG LVL3 (GOWN DISPOSABLE) ×6
GOWN STRL REUS W/TWL XL LVL3 (GOWN DISPOSABLE) ×9
HANDPIECE INTERPULSE COAX TIP (DISPOSABLE)
HOOD PEEL AWAY FACE SHEILD DIS (HOOD) ×6 IMPLANT
KIT BASIN OR (CUSTOM PROCEDURE TRAY) ×3 IMPLANT
KIT ROOM TURNOVER OR (KITS) ×3 IMPLANT
MANIFOLD NEPTUNE II (INSTRUMENTS) ×3 IMPLANT
NEEDLE 22X1 1/2 (OR ONLY) (NEEDLE) ×3 IMPLANT
NS IRRIG 1000ML POUR BTL (IV SOLUTION) ×3 IMPLANT
PACK TOTAL JOINT (CUSTOM PROCEDURE TRAY) ×3 IMPLANT
PAD ARMBOARD 7.5X6 YLW CONV (MISCELLANEOUS) ×6 IMPLANT
PASSER SUT SWANSON 36MM LOOP (INSTRUMENTS) ×3 IMPLANT
PRESSURIZER FEMORAL UNIV (MISCELLANEOUS) IMPLANT
SET HNDPC FAN SPRY TIP SCT (DISPOSABLE) IMPLANT
SUT ETHIBOND 2 V 37 (SUTURE) ×3 IMPLANT
SUT VIC AB 0 CTB1 27 (SUTURE) ×3 IMPLANT
SUT VIC AB 1 CTX 36 (SUTURE) ×3
SUT VIC AB 1 CTX36XBRD ANBCTR (SUTURE) ×1 IMPLANT
SUT VIC AB 2-0 CTB1 (SUTURE) ×3 IMPLANT
SUT VIC AB 3-0 SH 27 (SUTURE) ×3
SUT VIC AB 3-0 SH 27X BRD (SUTURE) ×1 IMPLANT
SYR CONTROL 10ML LL (SYRINGE) ×3 IMPLANT
TOWEL OR 17X24 6PK STRL BLUE (TOWEL DISPOSABLE) ×3 IMPLANT
TOWEL OR 17X26 10 PK STRL BLUE (TOWEL DISPOSABLE) ×3 IMPLANT
TOWER CARTRIDGE SMART MIX (DISPOSABLE) IMPLANT
TRAY FOLEY CATH 14FR (SET/KITS/TRAYS/PACK) IMPLANT
WATER STERILE IRR 1000ML POUR (IV SOLUTION) ×12 IMPLANT

## 2013-07-09 NOTE — Anesthesia Preprocedure Evaluation (Signed)
Anesthesia Evaluation  Patient identified by MRN, date of birth, ID band Patient awake    Reviewed: Allergy & Precautions, H&P , NPO status , Patient's Chart, lab work & pertinent test results  Airway Mallampati: I TM Distance: >3 FB Neck ROM: Full    Dental  (+) Teeth Intact, Dental Advisory Given   Pulmonary former smoker,  breath sounds clear to auscultation        Cardiovascular hypertension, Pt. on medications Rhythm:Regular     Neuro/Psych    GI/Hepatic GERD-  Medicated and Controlled,  Endo/Other    Renal/GU      Musculoskeletal   Abdominal   Peds  Hematology   Anesthesia Other Findings   Reproductive/Obstetrics                           Anesthesia Physical Anesthesia Plan  ASA: II  Anesthesia Plan: General   Post-op Pain Management:    Induction: Intravenous  Airway Management Planned: Oral ETT  Additional Equipment:   Intra-op Plan:   Post-operative Plan: Extubation in OR  Informed Consent: I have reviewed the patients History and Physical, chart, labs and discussed the procedure including the risks, benefits and alternatives for the proposed anesthesia with the patient or authorized representative who has indicated his/her understanding and acceptance.   Dental advisory given  Plan Discussed with: CRNA, Anesthesiologist and Surgeon  Anesthesia Plan Comments:         Anesthesia Quick Evaluation

## 2013-07-09 NOTE — Transfer of Care (Signed)
Immediate Anesthesia Transfer of Care Note  Patient: Shelly Hickman  Procedure(s) Performed: Procedure(s): TOTAL HIP ARTHROPLASTY (Left)  Patient Location: PACU  Anesthesia Type:General  Level of Consciousness: awake  Airway & Oxygen Therapy: Patient Spontanous Breathing and Patient connected to nasal cannula oxygen  Post-op Assessment: Report given to PACU RN and Post -op Vital signs reviewed and stable  Post vital signs: Reviewed and stable  Complications: No apparent anesthesia complications

## 2013-07-09 NOTE — Progress Notes (Signed)
Utilization review completed.  

## 2013-07-09 NOTE — Anesthesia Postprocedure Evaluation (Signed)
  Anesthesia Post-op Note  Patient: Shelly Hickman  Procedure(s) Performed: Procedure(s): TOTAL HIP ARTHROPLASTY (Left)  Patient Location: PACU  Anesthesia Type:General  Level of Consciousness: awake and alert   Airway and Oxygen Therapy: Patient Spontanous Breathing  Post-op Pain: mild  Post-op Assessment: Post-op Vital signs reviewed, Patient's Cardiovascular Status Stable and Respiratory Function Stable  Post-op Vital Signs: Reviewed  Filed Vitals:   07/09/13 1552  BP:   Pulse: 65  Temp: 36.3 C  Resp:     Complications: No apparent anesthesia complications

## 2013-07-09 NOTE — Interval H&P Note (Signed)
History and Physical Interval Note:  07/09/2013 12:10 PM  Shelly Hickman  has presented today for surgery, with the diagnosis of OSTEOARTHRITIS LEFT HIP  The various methods of treatment have been discussed with the patient and family. After consideration of risks, benefits and other options for treatment, the patient has consented to  Procedure(s): TOTAL HIP ARTHROPLASTY (Left) as a surgical intervention .  The patient's history has been reviewed, patient examined, no change in status, stable for surgery.  I have reviewed the patient's chart and labs.  Questions were answered to the patient's satisfaction.     Kerin Salen

## 2013-07-09 NOTE — Op Note (Signed)
OPERATIVE REPORT    DATE OF PROCEDURE:  07/09/2013       PREOPERATIVE DIAGNOSIS:  OSTEOARTHRITIS LEFT HIP                                                          POSTOPERATIVE DIAGNOSIS:  * No post-op diagnosis entered *                                                           PROCEDURE:  L total hip arthroplasty using a 52 mm DePuy Pinnacle  Cup, [employer], 10-degree polyethylene liner index superior  and posterior, a +3 36 mm metal head, a 18x13x42x160 SROM stem, 18Dsm Sleeve   SURGEON: Kerin Salen    ASSISTANT:   Kerry Hough. Barton Dubois  (present throughout entire procedure and necessary for timely completion of the procedure)   ANESTHESIA: General BLOOD LOSS: 400 FLUID REPLACEMENT: 1800 crystalloid COMPLICATIONS: none    INDICATIONS FOR PROCEDURE: A 73 y.o. year-old With  [place]   for 2 years, x-rays show bone-on-bone arthritic changes. Despite conservative measures with observation, anti-inflammatory medicine, narcotics, use of a cane, has severe unremitting pain and can ambulate only a few blocks before resting.  Patient desires elective L total hip arthroplasty to decrease pain and increase function. The risks, benefits, and alternatives were discussed at length including but not limited to the risks of infection, bleeding, nerve injury, stiffness, blood clots, the need for revision surgery, cardiopulmonary complications, among others, and they were willing to proceed. Questions answered     PROCEDURE IN DETAIL: The patient was identified by armband,  received preoperative IV antibiotics in the holding area at [facility], taken to the operating room , appropriate anesthetic monitors  were attached and general endotracheal anesthesia induced. Foley catheter was inserted. Pt was rolled into the R lateral decubitus position and fixed there with a Stulberg Mark II pelvic clamp.  The L lower extremity was then prepped and draped  in the usual  sterile fashion from the ankle to the hemipelvis. A time-out  procedure was performed. The skin along the lateral hip and thigh  infiltrated with 10 mL of 0.5% Marcaine and epinephrine solution. We  then made a posterolateral approach to the hip. With a #10 blade, a 24 cm  incision was made through the skin and subcutaneous tissue down to the level of the  IT band. Small bleeders were identified and cauterized. The IT band was cut in  line with skin incision exposing the greater trochanter. A Cobra retractor was placed between the gluteus minimus and the superior hip joint capsule, and a spiked Cobra between the quadratus femoris and the inferior hip joint capsule. This isolated the short  external rotators and piriformis tendons. These were tagged with a #2 Ethibond  suture and cut off their insertion on the intertrochanteric crest. The posterior  capsule was then developed into an acetabular-based flap from Posterior Superior off of the acetabulum out over the femoral neck and back posterior inferior to the acetabular rim. This flap was tagged with two #2 Ethibond sutures and  retracted protecting the sciatic nerve. This exposed the arthritic femoral head and osteophytes. The hip was then flexed and internally rotated, dislocating the femoral head and a standard neck cut performed 1 fingerbreadth above the lesser trochanter.  A spiked Cobra was placed in the cotyloid notch and a Hohmann retractor was then used to lever the femur anteriorly off of the anterior pelvic column. A posterior-inferior wing retractor was placed at the junction of the acetabulum and the ischium completing the acetabular exposure.We then removed the peripheral osteophytes and labrum from the acetabulum. We then reamed the acetabulum up to 51 mm with basket reamers obtaining good coverage in all quadrants. We then irrigated with normal  saline solution and hammered into place a 52 mm pinnacle cup in 45  degrees of abduction and  about 20 degrees of anteversion. More  peripheral osteophytes removed and a trial 10-degree liner placed with the  index superior-posterior. The hip was then flexed and internally rotated exposing the  proximal femur, which was entered with the initiating reamer followed by  the axial reamers up to a 13.5 mm full depth and 44mm partial depth. We then conically reamed to 18D to the correct depth for a 42 base neck. The femoral calcar was noted to be in 75 of anteversion. The calcar was milled to 18Dsm. A trial cone and stem was inserted in the 25 degrees anteversion, with a +3 71mm trial head. Trial reduction was then performed and excellent stability was noted with at 90 of flexion with 75 of internal rotation and then full extension with maximal external rotation. The hip could not be dislocated in full extension. The knee could easily flex  to about 130 degrees. We also stretched the abductors at this point,  because of the preexisting adductor contractures. All trial components  were then removed. The acetabulum was irrigated out with normal saline  solution. A titanium [facility] was then screwed into place  followed by a 10-degree polyethylene liner index superior-posterior. On  the femoral side a 18Dsm ZTT1 sleeve was hammered into place, followed by a 18x13x42x160 SROM stem in 25 degrees of anteversion. At this point, a +0 36 mm ceramic head was  hammered on the stem. The hip was reduced. We checked our stability  one more time and found it to be excellent. The wound was once again  thoroughly irrigated out with normal saline solution pulse lavage. The  capsular flap and short external rotators were repaired back to the  intertrochanteric crest through drill holes with a #2 Ethibond suture.  The IT band was closed with running 1 Vicryl suture. The subcutaneous  tissue with 0 and 2-0 undyed Vicryl suture and the skin with running  i subcuticular 3-0 Vicryl suture. Dressing of  aquacel was  then applied. The patient was then unclamped, rolled supine, awaken extubated and taken to recovery room without difficulty in stable condition.   Kerin Salen 07/09/2013, 2:17 PM

## 2013-07-10 ENCOUNTER — Encounter (HOSPITAL_COMMUNITY): Payer: Self-pay | Admitting: Orthopedic Surgery

## 2013-07-10 LAB — BASIC METABOLIC PANEL
BUN: 21 mg/dL (ref 6–23)
CO2: 24 mEq/L (ref 19–32)
Calcium: 8.3 mg/dL — ABNORMAL LOW (ref 8.4–10.5)
Chloride: 100 mEq/L (ref 96–112)
Creatinine, Ser: 1 mg/dL (ref 0.50–1.10)
GFR calc Af Amer: 64 mL/min — ABNORMAL LOW (ref >90)
GFR calc non Af Amer: 55 mL/min — ABNORMAL LOW (ref >90)
Glucose, Bld: 170 mg/dL — ABNORMAL HIGH (ref 70–99)
Potassium: 5.6 mEq/L — ABNORMAL HIGH (ref 3.7–5.3)
Sodium: 137 mEq/L (ref 137–147)

## 2013-07-10 LAB — CBC
HCT: 35 % — ABNORMAL LOW (ref 36.0–46.0)
Hemoglobin: 11.2 g/dL — ABNORMAL LOW (ref 12.0–15.0)
MCH: 29.2 pg (ref 26.0–34.0)
MCHC: 32 g/dL (ref 30.0–36.0)
MCV: 91.1 fL (ref 78.0–100.0)
Platelets: 322 10*3/uL (ref 150–400)
RBC: 3.84 MIL/uL — ABNORMAL LOW (ref 3.87–5.11)
RDW: 16.1 % — ABNORMAL HIGH (ref 11.5–15.5)
WBC: 20.5 10*3/uL — ABNORMAL HIGH (ref 4.0–10.5)

## 2013-07-10 NOTE — Evaluation (Signed)
Occupational Therapy Evaluation Patient Details Name: Shelly Hickman MRN: [id number] DOB: [birth year] Today's Date: 07/10/2013    History of Present Illness 73 yo F admitted for Left posterior THA.   Clinical Impression   Pt presents with below problem list. Pt independent with ADLs, PTA. Feel pt will benefit from acute OT to increase independence prior to d/c. Recommending SNF for rehab due to pt's husband limited in assisting pt due to recent back surgery.     Follow Up Recommendations  SNF    Equipment Recommendations  Other (comment) (long handled sponge and shoehorn)    Recommendations for Other Services       Precautions / Restrictions Precautions Precautions: Posterior Hip Precaution Booklet Issued:  (Given by PT) Precaution Comments: Reviewed precautions with pt Restrictions Weight Bearing Restrictions: Yes Other Position/Activity Restrictions: WBAT LLE      Mobility Bed Mobility Overal bed mobility: Needs Assistance Bed Mobility: Sit to Supine       Sit to supine: Min assist   General bed mobility comments: Assistance with LLE.  Transfers Overall transfer level: Needs assistance Equipment used: Rolling walker (2 wheeled) Transfers: Sit to/from Stand Sit to Stand: Min assist         General transfer comment: Cues for technique.         ADL Overall ADL's : Needs assistance/impaired Eating/Feeding: Independent;Sitting   Grooming: Wash/dry hands;Min guard;Standing   Upper Body Bathing: Set up;Supervision/ safety;Sitting   Lower Body Bathing: Sit to/from stand;Maximal assistance   Upper Body Dressing : Set up;Supervision/safety;Sitting   Lower Body Dressing: Sit to/from stand;Maximal assistance   Toilet Transfer: Minimal assistance;Ambulation;Min guard;RW (3 in 1 over commode)   Toileting- Clothing Manipulation and Hygiene: Min guard (standing)       Functional mobility during ADLs: Minimal assistance;Rolling walker;Min guard General  ADL Comments: Educated on AE for LB ADLs as well as dressing technique. Recommended sitting for most of bathing/dressing. Educated on use of bag on walker as well as safe shoewear. Discussed rugs.      Vision                     Perception     Praxis      Pertinent Vitals/Pain Pain 7/10. Repositioned.      Hand Dominance     Extremity/Trunk Assessment Upper Extremity Assessment Upper Extremity Assessment: Overall WFL for tasks assessed   Lower Extremity Assessment Lower Extremity Assessment: Defer to PT evaluation LLE Deficits / Details: Decreased strength throughout LLE due to pain and recent surgery   Cervical / Trunk Assessment Cervical / Trunk Assessment: Normal   Communication Communication Communication: No difficulties   Cognition Arousal/Alertness: Awake/alert Behavior During Therapy: WFL for tasks assessed/performed Overall Cognitive Status: Within Functional Limits for tasks assessed                     General Comments          Shoulder Instructions      Home Living Family/patient expects to be discharged to:: Skilled nursing facility Living Arrangements: Other (Comment) (Spouse recently had back surgery and unable to assist pt)                               Additional Comments: has reachers and sockaid      Prior Functioning/Environment Level of Independence: Independent             OT Diagnosis: Acute  pain   OT Problem List: Impaired balance (sitting and/or standing);Decreased activity tolerance;Decreased strength;Pain;Decreased knowledge of use of DME or AE;Decreased knowledge of precautions   OT Treatment/Interventions: Self-care/ADL training;DME and/or AE instruction;Therapeutic activities;Patient/family education;Balance training    OT Goals(Current goals can be found in the care plan section) Acute Rehab OT Goals Patient Stated Goal: go on a trip to see her grand daughter OT Goal Formulation: With  patient Time For Goal Achievement: 07/17/13 Potential to Achieve Goals: Good ADL Goals Pt Will Perform Lower Body Bathing: with set-up;with supervision;sit to/from stand;with adaptive equipment Pt Will Perform Lower Body Dressing: with set-up;with supervision;with adaptive equipment;sit to/from stand Pt Will Transfer to Toilet: with modified independence;ambulating (3 in 1 over commode) Pt Will Perform Toileting - Clothing Manipulation and hygiene: with modified independence;sit to/from stand Additional ADL Goal #1: Pt will independently verbalize and demonstrate 3/3 hip precautions.  OT Frequency: Min 2X/week   Barriers to D/C:            Co-evaluation              End of Session Equipment Utilized During Treatment: Gait belt;Rolling walker  Activity Tolerance: Patient limited by pain Patient left: in bed;with call bell/phone within reach;with family/visitor present   Time: 1029-1101 OT Time Calculation (min): 32 min Charges:  OT General Charges $OT Visit: 1 Procedure OT Evaluation $Initial OT Evaluation Tier I: 1 Procedure OT Treatments $Self Care/Home Management : 8-22 mins G-Codes:    Benito Mccreedy OTR/L [phone] 07/10/2013, 11:14 AM

## 2013-07-10 NOTE — Evaluation (Signed)
Physical Therapy Evaluation Patient Details Name: Shelly Hickman MRN: [id number] DOB: 15-Jun-1940 Today's Date: 07/10/2013   History of Present Illness  73 yo F admitted for Left posterior THA.  Clinical Impression  Pt is s/p THA resulting in the deficits listed below (see PT Problem List). Pt will benefit from skilled PT to increase their independence and safety with mobility. Due to husband not being able to assist at home pt plans to DC to [facility] place for short term rehab until she can care for herself.      Follow Up Recommendations SNF;Supervision for mobility/OOB    Equipment Recommendations  Rolling walker with 5" wheels    Recommendations for Other Services       Precautions / Restrictions Precautions Precautions: Posterior Hip Precaution Booklet Issued: Yes (comment) Precaution Comments: Reviewed twice with pt Restrictions Weight Bearing Restrictions: Yes Other Position/Activity Restrictions: WBAT LLE      Mobility  Bed Mobility               General bed mobility comments: Not tested as pt had just gotten to chair with RN  Transfers Overall transfer level: Needs assistance Equipment used: Ambulation equipment used Transfers: Sit to/from Stand Sit to Stand: Min assist         General transfer comment: Verbal cues for hand placement and to palce LLE out in front  Ambulation/Gait Ambulation/Gait assistance: Min assist Ambulation Distance (Feet): 15 Feet Assistive device: Rolling walker (2 wheeled) Gait Pattern/deviations: Step-to pattern;Decreased stride length Gait velocity: greatly decreased Gait velocity interpretation: <1.8 ft/sec, indicative of risk for recurrent falls General Gait Details: Gait was very painful for pt.  She states she is very "tenseArt gallery manager Rankin (Stroke Patients Only)       Balance Overall balance assessment: Needs assistance Sitting-balance support: Feet  supported Sitting balance-Leahy Scale: Fair Sitting balance - Comments: NT greater than fair   Standing balance support: Bilateral upper extremity supported Standing balance-Leahy Scale: Poor                               Pertinent Vitals/Pain 9/10 left hip pain. Had pain meds just prior to PT's arrival. postioned comfortably in chair.    Home Living Family/patient expects to be discharged to:: Skilled nursing facility Living Arrangements: Other (Comment) (Spouse recently had back surgery and unable to assist pt)                    Prior Function Level of Independence: Independent               Hand Dominance        Extremity/Trunk Assessment   Upper Extremity Assessment: Defer to OT evaluation           Lower Extremity Assessment: LLE deficits/detail   LLE Deficits / Details: Decreased strength throughout LLE due to pain and recent surgery  Cervical / Trunk Assessment: Normal  Communication   Communication: No difficulties  Cognition Arousal/Alertness: Awake/alert Behavior During Therapy: WFL for tasks assessed/performed Overall Cognitive Status: Within Functional Limits for tasks assessed                      General Comments General comments (skin integrity, edema, etc.): No family present for eval    Exercises Total Joint Exercises Ankle Circles/Pumps: AROM;Both;10 reps;Seated Quad Sets:  AROM;5 reps;Seated;Left Hip ABduction/ADduction: AAROM;Left;10 reps;Seated (in recliner) Long Arc Quad: AROM;10 reps;Seated      Assessment/Plan    PT Assessment Patient needs continued PT services  PT Diagnosis Difficulty walking   PT Problem List Decreased strength;Decreased range of motion;Decreased mobility;Decreased knowledge of use of DME;Decreased knowledge of precautions;Pain  PT Treatment Interventions DME instruction;Gait training;Therapeutic exercise;Patient/family education   PT Goals (Current goals can be found in the  Care Plan section) Acute Rehab PT Goals Patient Stated Goal: to get more rehab before going home PT Goal Formulation: With patient Time For Goal Achievement: 07/17/13 Potential to Achieve Goals: Good    Frequency 7X/week   Barriers to discharge Decreased caregiver support      Co-evaluation               End of Session Equipment Utilized During Treatment: Gait belt Activity Tolerance: Patient tolerated treatment well Patient left: in chair;with call bell/phone within reach Nurse Communication: Mobility status         Time: 8144-8185 PT Time Calculation (min): 35 min   Charges:   PT Evaluation $Initial PT Evaluation Tier I: 1 Procedure PT Treatments $Therapeutic Exercise: 8-22 mins   PT G CodesVincent Peyer [facility] 07/10/2013, 10:00 AM Lavonia Dana, PT  [phone] 07/10/2013

## 2013-07-10 NOTE — Progress Notes (Signed)
   PATIENT ID: [name]   1 Day Post-Op Procedure(s) (LRB): TOTAL HIP ARTHROPLASTY (Left)  Subjective: Sitting up in bed, eating breakfast. Has been to the bathroom and tolerated that well. Pain is well controlled. No other complaints or concerns.   Objective:  Filed Vitals:   07/10/13 0531  BP: 132/69  Pulse: 75  Temp: 98.8 F (37.1 C)  Resp: 18     L hip dressing c/d/i Wiggles toes, distally NVI Calf soft, nontender  Labs:   Recent Labs  07/10/13 0406  HGB 11.2*   Recent Labs  07/10/13 0406  WBC 20.5*  RBC 3.84*  HCT 35.0*  PLT 322   Recent Labs  07/10/13 0406  NA 137  K 5.6*  CL 100  CO2 24  BUN 21  CREATININE 1.00  GLUCOSE 170*  CALCIUM 8.3*    Assessment and Plan: 1 day s/p left THA WBAT Up with PT today Sounds like d/c to SNF when available, if reccommended by PT, likely d/c tomorrow or monday Continue pain mgmt  VTE proph: ASA 325mg  BID, SCDs

## 2013-07-11 LAB — BASIC METABOLIC PANEL
BUN: 23 mg/dL (ref 6–23)
CO2: 25 mEq/L (ref 19–32)
Calcium: 8.4 mg/dL (ref 8.4–10.5)
Chloride: 94 mEq/L — ABNORMAL LOW (ref 96–112)
Creatinine, Ser: 1.22 mg/dL — ABNORMAL HIGH (ref 0.50–1.10)
GFR calc Af Amer: 50 mL/min — ABNORMAL LOW (ref >90)
GFR calc non Af Amer: 43 mL/min — ABNORMAL LOW (ref >90)
Glucose, Bld: 117 mg/dL — ABNORMAL HIGH (ref 70–99)
Potassium: 3.9 mEq/L (ref 3.7–5.3)
Sodium: 131 mEq/L — ABNORMAL LOW (ref 137–147)

## 2013-07-11 LAB — CBC
HCT: 26.8 % — ABNORMAL LOW (ref 36.0–46.0)
Hemoglobin: 9 g/dL — ABNORMAL LOW (ref 12.0–15.0)
MCH: 30.1 pg (ref 26.0–34.0)
MCHC: 33.6 g/dL (ref 30.0–36.0)
MCV: 89.6 fL (ref 78.0–100.0)
Platelets: 255 10*3/uL (ref 150–400)
RBC: 2.99 MIL/uL — ABNORMAL LOW (ref 3.87–5.11)
RDW: 15.5 % (ref 11.5–15.5)
WBC: 13.9 10*3/uL — ABNORMAL HIGH (ref 4.0–10.5)

## 2013-07-11 NOTE — Progress Notes (Signed)
   PATIENT ID: Shelly Hickman   2 Days Post-Op Procedure(s) (LRB): TOTAL HIP ARTHROPLASTY (Left)  Subjective: Sitting in chair eating breakfast. Says pain is improving. Working well with PT. Hopes to be d/ced to [facility] soon.   Objective:  Filed Vitals:   07/11/13 0912  BP: 100/49  Pulse: 91  Temp:   Resp: 16     L hip dressing c/d/i  Wiggles toes, distally NVI  Calf soft, nontender   Labs:   Recent Labs  07/10/13 0406 07/11/13 0544  HGB 11.2* 9.0*   Recent Labs  07/10/13 0406 07/11/13 0544  WBC 20.5* 13.9*  RBC 3.84* 2.99*  HCT 35.0* 26.8*  PLT 322 255   Recent Labs  07/10/13 0406  NA 137  K 5.6*  CL 100  CO2 24  BUN 21  CREATININE 1.00  GLUCOSE 170*  CALCIUM 8.3*    Assessment and Plan: 2 days s/p left THA Continue current pain mgmt, pain well controlled Potassium a little high yesterday am, will repeat labs and monitor  Continue with PT, recommend SNF and sounds like this is set up  D/c to SNF when bed available Scripts in chart Follow up Dr. Mayer Camel in 2 weeks  VTE proph: ASA 325mg  BID, SCDs

## 2013-07-11 NOTE — Progress Notes (Addendum)
Clinical Social Work Department CLINICAL SOCIAL WORK PLACEMENT NOTE 07/11/2013  Patient:  OMER, MONTER  Account Number:  [id number] Admit date:  07/09/2013  Clinical Social Worker:  Blima Rich, Latanya Presser  Date/time:  07/11/2013 07:24 PM  Clinical Social Work is seeking post-discharge placement for this patient at the following level of care:   SKILLED NURSING   (*CSW will update this form in Epic as items are completed)   07/11/2013  Patient/family provided with [place] Department of Clinical Social Work's list of facilities offering this level of care within the geographic area requested by the patient (or if unable, by the patient's family).  07/11/2013  Patient/family informed of their freedom to choose among providers that offer the needed level of care, that participate in Medicare, Medicaid or managed care program needed by the patient, have an available bed and are willing to accept the patient.  07/11/2013  Patient/family informed of [facility]' ownership interest in [facility], as well as of the fact that they are under no obligation to receive care at this facility.  PASARR submitted to EDS on 07/11/2013 PASARR number received from EDS on 07/11/2013  FL2 transmitted to all facilities in geographic area requested by pt/family on  07/11/2013 FL2 transmitted to all facilities within larger geographic area on   Patient informed that his/her managed care company has contracts with or will negotiate with  certain facilities, including the following:     Patient/family informed of bed offers received:  07/12/13 Patient chooses bed at [facility] Physician recommends and patient chooses bed at    Patient to be transferred to  [facility] on 07/13/13   Patient to be transferred to facility by [facility]  The following physician request were entered in Epic:   Additional Comments:

## 2013-07-11 NOTE — Progress Notes (Signed)
Pt complaining of left groin pain. Medications and ice relieve her pain.

## 2013-07-11 NOTE — Progress Notes (Signed)
Physical Therapy Treatment Patient Details Name: RIONA LAHTI MRN: [id number] DOB: Feb 27, 1940 Today's Date: 07/11/2013    History of Present Illness 73 yo F admitted for Left posterior THA.    PT Comments    Pt progressing with mobility.  Continue to recommend SNF for ongoing Physical Therapy as pt would not be able to manage safely at home at this point.  Fatigues quickly with mobility.    Follow Up Recommendations  SNF;Supervision for mobility/OOB     Equipment Recommendations  Rolling walker with 5" wheels    Recommendations for Other Services       Precautions / Restrictions Precautions Precautions: Posterior Hip Precaution Booklet Issued: Yes (comment) Precaution Comments: Pt able to recall 1/3 hip precautions Restrictions Weight Bearing Restrictions: Yes Other Position/Activity Restrictions: WBAT LLE    Mobility  Bed Mobility Overal bed mobility: Needs Assistance Bed Mobility: Supine to Sit     Supine to sit: Min assist     General bed mobility comments: Assist with LLE, pt used rails as well  Transfers Overall transfer level: Needs assistance Equipment used: Rolling walker (2 wheeled) Transfers: Sit to/from Stand Sit to Stand: Min assist         General transfer comment: Cues for technique.  Ambulation/Gait Ambulation/Gait assistance: Min assist Ambulation Distance (Feet): 25 Feet Assistive device: Rolling walker (2 wheeled) Gait Pattern/deviations: Step-to pattern;Decreased stride length Gait velocity: decreased, but improved from yesterday   General Gait Details: gait improved.  Pt very fatigued, especially in arms after gait   Stairs            Wheelchair Mobility    Modified Rankin (Stroke Patients Only)       Balance                                    Cognition Arousal/Alertness: Awake/alert Behavior During Therapy: WFL for tasks assessed/performed Overall Cognitive Status: Within Functional Limits for  tasks assessed                      Exercises Total Joint Exercises Ankle Circles/Pumps: AROM;Both;10 reps;Supine Quad Sets: Other (comment) (pt reports doing quad sets on her own) Heel Slides: AAROM;Left;10 reps;Supine Hip ABduction/ADduction: AAROM;Left;10 reps;Supine Long Arc Quad: AROM;Left;10 reps;Seated    General Comments General comments (skin integrity, edema, etc.): Pt cooperative and pleasant for treatment. Motivated to get better after surgery.       Pertinent Vitals/Pain Pt reports no pain at rest, did report pain with activity but did not rate. Positioned comfortably at end of session.      Home Living                      Prior Function            PT Goals (current goals can now be found in the care plan section) Progress towards PT goals: Progressing toward goals    Frequency  7X/week    PT Plan Current plan remains appropriate    Co-evaluation             End of Session Equipment Utilized During Treatment: Gait belt Activity Tolerance: Patient tolerated treatment well Patient left: in chair;with call bell/phone within reach     Time: 0813-0841 PT Time Calculation (min): 28 min  Charges:  $Gait Training: 8-22 mins $Therapeutic Exercise: 8-22 mins  G CodesVincent Peyer [facility] 07/28/2013, 8:48 AM Lavonia Dana, PT  [phone] 2013/07/28

## 2013-07-11 NOTE — Progress Notes (Signed)
Clinical Social Work Department BRIEF PSYCHOSOCIAL ASSESSMENT 07/11/2013  Patient:  YARED, SUSAN     Account Number:  [id number]     Admit date:  07/09/2013  Clinical Social Worker:  Rolinda Roan  Date/Time:  07/11/2013 07:20 PM  Referred by:  Physician  Date Referred:  07/10/2013 Referred for  SNF Placement   Other Referral:   Interview type:  Patient Other interview type:    PSYCHOSOCIAL DATA Living Status:  HUSBAND Admitted from facility:   Level of care:   Primary support name:  Zachery Dauer Primary support relationship to patient:  SPOUSE Degree of support available:   Good support per patient.    CURRENT CONCERNS  Other Concerns:    SOCIAL WORK ASSESSMENT / PLAN Clinical Social Worker (CSW) met with patient to discuss SNF placement. Patient stated that she lives with her husband Konrad Dolores in [place]. Patient reported that she has pre-registered at [facility] with Florentina Jenny. CSW explained that patient's insurance [employer] will need prior authorization before she can go to [facility]. Patient verbalized her understanding.   Assessment/plan status:  Psychosocial Support/Ongoing Assessment of Needs Other assessment/ plan:   Information/referral to community resources:    PATIENT'S/FAMILY'S RESPONSE TO PLAN OF CARE: Patient thanked CSW for visit and assisting with placement process.

## 2013-07-12 DIAGNOSIS — M1610 Unilateral primary osteoarthritis, unspecified hip: Secondary | ICD-10-CM

## 2013-07-12 DIAGNOSIS — R404 Transient alteration of awareness: Secondary | ICD-10-CM

## 2013-07-12 DIAGNOSIS — R410 Disorientation, unspecified: Secondary | ICD-10-CM | POA: Diagnosis not present

## 2013-07-12 LAB — URINALYSIS, ROUTINE W REFLEX MICROSCOPIC
Bilirubin Urine: NEGATIVE
Glucose, UA: NEGATIVE mg/dL
Hgb urine dipstick: NEGATIVE
Ketones, ur: NEGATIVE mg/dL
Leukocytes, UA: NEGATIVE
Nitrite: NEGATIVE
Protein, ur: NEGATIVE mg/dL
Specific Gravity, Urine: 1.017 (ref 1.005–1.030)
Urobilinogen, UA: 0.2 mg/dL (ref 0.0–1.0)
pH: 5.5 (ref 5.0–8.0)

## 2013-07-12 LAB — CBC
HCT: 24.9 % — ABNORMAL LOW (ref 36.0–46.0)
Hemoglobin: 8.4 g/dL — ABNORMAL LOW (ref 12.0–15.0)
MCH: 30 pg (ref 26.0–34.0)
MCHC: 33.7 g/dL (ref 30.0–36.0)
MCV: 88.9 fL (ref 78.0–100.0)
Platelets: 256 10*3/uL (ref 150–400)
RBC: 2.8 MIL/uL — ABNORMAL LOW (ref 3.87–5.11)
RDW: 15.1 % (ref 11.5–15.5)
WBC: 12.8 10*3/uL — ABNORMAL HIGH (ref 4.0–10.5)

## 2013-07-12 LAB — BASIC METABOLIC PANEL
BUN: 20 mg/dL (ref 6–23)
CO2: 26 mEq/L (ref 19–32)
Calcium: 8.7 mg/dL (ref 8.4–10.5)
Chloride: 94 mEq/L — ABNORMAL LOW (ref 96–112)
Creatinine, Ser: 1.1 mg/dL (ref 0.50–1.10)
GFR calc Af Amer: 57 mL/min — ABNORMAL LOW (ref >90)
GFR calc non Af Amer: 49 mL/min — ABNORMAL LOW (ref >90)
Glucose, Bld: 146 mg/dL — ABNORMAL HIGH (ref 70–99)
Potassium: 3.9 mEq/L (ref 3.7–5.3)
Sodium: 131 mEq/L — ABNORMAL LOW (ref 137–147)

## 2013-07-12 MED ORDER — SODIUM CHLORIDE 0.9 % IV BOLUS (SEPSIS)
500.0000 mL | Freq: Once | INTRAVENOUS | Status: AC
  Administered 2013-07-12: 500 mL via INTRAVENOUS

## 2013-07-12 MED ORDER — SODIUM CHLORIDE 0.9 % IV SOLN: INTRAVENOUS | Status: DC

## 2013-07-12 MED ORDER — ACETAMINOPHEN 500 MG PO TABS
1000.0000 mg | ORAL_TABLET | Freq: Three times a day (TID) | ORAL | Status: DC
  Administered 2013-07-12 – 2013-07-13 (×3): 1000 mg via ORAL
  Filled 2013-07-12 (×5): qty 2

## 2013-07-12 MED ORDER — TRAZODONE HCL 100 MG PO TABS
100.0000 mg | ORAL_TABLET | Freq: Every day | ORAL | Status: DC
  Administered 2013-07-12: 100 mg via ORAL
  Filled 2013-07-12 (×2): qty 1

## 2013-07-12 NOTE — Progress Notes (Addendum)
Patient very anxious. Patient complaining that she has been in pain all day long although she has denied pain several times today and has not complained of pain since about 09:55 this am. Patient complaining about R arm again saying, "You know, I have bruises on my right arm." She is also complaining that the staff in the operating saying "they stood around and talked for a long time, whereas when I had surgery before, they started anesthesia right away." She says she would rather be discharged tomorrow. She is also worried that her incision is infected. Incision site is hot to touch but only slightly warmer than R side. Non-febrile. She is very irritable. Plan is to discharge tomorrow per [place], [place].

## 2013-07-12 NOTE — Progress Notes (Signed)
CSW informed that pt remains medically ready for dc. CSW received [facility] #[id number]. Pt aware, agreeable and messaged left for pt husband. Packet in shadow chart and [facility] contacted for transport. CSW signing off.  Hunt Oris, MSW, [place]

## 2013-07-12 NOTE — Progress Notes (Signed)
Physical Therapy Treatment Patient Details Name: Shelly Hickman MRN: [id number] DOB: 1940/03/28 Today's Date: 07/12/2013    History of Present Illness 73 yo F admitted for Left posterior THA.    PT Comments    Pt making slow, steady progress. Continue to recommend SNF for ongoing Physical Therapy.  Pt with c/o pain in r arm today which is new in onset.   Follow Up Recommendations  SNF;Supervision for mobility/OOB     Equipment Recommendations  Rolling walker with 5" wheels    Recommendations for Other Services       Precautions / Restrictions Precautions Precautions: Posterior Hip Precaution Booklet Issued: Yes (comment) Precaution Comments: Pt able to recall 2/3 hip precautions Restrictions Weight Bearing Restrictions: Yes LLE Weight Bearing: Weight bearing as tolerated Other Position/Activity Restrictions: WBAT LLE    Mobility  Bed Mobility Overal bed mobility: Needs Assistance Bed Mobility: Supine to Sit     Supine to sit: Min assist     General bed mobility comments: Assist with LLE, pt used rails as well  Transfers Overall transfer level: Needs assistance Equipment used: Rolling walker (2 wheeled) Transfers: Sit to/from Stand Sit to Stand: Min assist         General transfer comment: Cues for technique.  Needed more assist today than yesterday with first transfer.   Ambulation/Gait Ambulation/Gait assistance: Min guard Ambulation Distance (Feet): 30 Feet Assistive device: Rolling walker (2 wheeled)   Gait velocity: decreased Gait velocity interpretation: <1.8 ft/sec, indicative of risk for recurrent falls General Gait Details: Pt less fatigued after gait today   Stairs            Wheelchair Mobility    Modified Rankin (Stroke Patients Only)       Balance                                    Cognition Arousal/Alertness: Awake/alert Behavior During Therapy: WFL for tasks assessed/performed Overall Cognitive Status:  Within Functional Limits for tasks assessed                      Exercises Total Joint Exercises Ankle Circles/Pumps: AROM;Both;10 reps;Supine Quad Sets: Other (comment) (pt reports doing quad sets on her own) Heel Slides: AAROM;Left;10 reps;Supine Hip ABduction/ADduction: AAROM;Left;10 reps;Supine Long Arc Quad: AROM;Left;10 reps;Seated    General Comments General comments (skin integrity, edema, etc.): Husband present for treatment session today.  Pt c/o pain in r arm today which she attributes to the BP cuff from last night.        Pertinent Vitals/Pain 7.5/10 in left hip. Also c/o pain in r arm and r knee. Positioned comfortably in recliner at end of session.       Home Living                      Prior Function            PT Goals (current goals can now be found in the care plan section) Progress towards PT goals: Progressing toward goals    Frequency  7X/week    PT Plan Current plan remains appropriate    Co-evaluation             End of Session Equipment Utilized During Treatment: Gait belt Activity Tolerance: Patient tolerated treatment well Patient left: in chair;with call bell/phone within reach     Time: 3546-5681 PT Time Calculation (min):  28 min  Charges:  $Gait Training: 8-22 mins $Therapeutic Exercise: 8-22 mins                    G CodesVincent Peyer [facility] 2013/07/29, 9:12 AM Lavonia Dana, PT  [phone] 2013/07/29

## 2013-07-12 NOTE — Progress Notes (Signed)
Took out patient's IV d/t no dressing over site/risk for infection. Afterwards, I asked patient to hold pressure over the site. She said "okay" and did so. However, a couple seconds later, she said "You take better pictures than I do." I said "what?" Pt replied "you were talking about pictures weren't you." I said "no, I asked you to hold pressure." She said that she knows she is confused and that she keeps talking to people who aren't in the room. Then, while I was in the room, she said, "I just thought I saw my sister's cat in the room." Physician called.

## 2013-07-12 NOTE — Discharge Summary (Addendum)
Patient ID: Shelly Hickman MRN: [id number] DOB/AGE: 71-May-1942 71 y.o.  Admit date: 07/09/2013 Discharge date: 07/12/2013  Admission Diagnoses:  Principal Problem:   Arthritis of left hip   Discharge Diagnoses:  Same  Past Medical History  Diagnosis Date  . Hypertension   . Hypercholesterolemia   . Depression   . Anxiety   . Anemia   . Pneumonia     11/2012- hosp. at [facility]  . H/O hiatal hernia   . Arthritis     "entire body"  . Incontinence of urine     Surgeries: Procedure(s): TOTAL HIP ARTHROPLASTY on 07/09/2013   Consultants:    Discharged Condition: Improved  Hospital Course: WEN MUNFORD is an 73 y.o. female who was admitted 07/09/2013 for operative treatment ofArthritis of left hip. Patient has severe unremitting pain that affects sleep, daily activities, and work/hobbies. After pre-op clearance the patient was taken to the operating room on 07/09/2013 and underwent  Procedure(s): TOTAL HIP ARTHROPLASTY.    Patient was given perioperative antibiotics: Anti-infectives   Start     Dose/Rate Route Frequency Ordered Stop   07/09/13 0600  vancomycin (VANCOCIN) IVPB 1000 mg/200 mL premix     1,000 mg 200 mL/hr over 60 Minutes Intravenous On call to O.R. 07/08/13 1404 07/09/13 1244       Patient was given sequential compression devices, early ambulation, and chemoprophylaxis to prevent DVT.  Patient benefited maximally from hospital stay and there were no complications.    Recent vital signs: Patient Vitals for the past 24 hrs:  BP Temp Temp src Pulse Resp SpO2  07/12/13 0635 129/48 mmHg 98.1 F (36.7 C) Oral 101 18 96 %  07/11/13 2136 147/47 mmHg 98.1 F (36.7 C) Oral 103 18 96 %  07/11/13 1647 117/49 mmHg 97.9 F (36.6 C) Oral 102 18 95 %  07/11/13 1600 - - - - 16 95 %  07/11/13 1200 - - - - 16 98 %     Recent laboratory studies:  Recent Labs  07/10/13 0406 07/11/13 0544 07/11/13 1130 07/12/13 0415  WBC 20.5* 13.9*  --  12.8*  HGB 11.2* 9.0*   --  8.4*  HCT 35.0* 26.8*  --  24.9*  PLT 322 255  --  256  NA 137  --  131*  --   K 5.6*  --  3.9  --   CL 100  --  94*  --   CO2 24  --  25  --   BUN 21  --  23  --   CREATININE 1.00  --  1.22*  --   GLUCOSE 170*  --  117*  --   CALCIUM 8.3*  --  8.4  --      Discharge Medications:     Medication List    STOP taking these medications       HYDROcodone-acetaminophen 5-325 MG per tablet  Commonly known as:  NORCO/VICODIN  Replaced by:  HYDROcodone-acetaminophen 7.5-325 MG per tablet      TAKE these medications       amLODipine 5 MG tablet  Commonly known as:  NORVASC  Take 5 mg by mouth daily before breakfast.     aspirin EC 325 MG tablet  Take 1 tablet (325 mg total) by mouth 2 (two) times daily.     calcium carbonate 750 MG chewable tablet  Commonly known as:  TUMS EX  Chew 2 tablets by mouth daily as needed for heartburn.     cholecalciferol  1000 UNITS tablet  Commonly known as:  VITAMIN D  Take 5,000 Units by mouth daily.     DULoxetine 60 MG capsule  Commonly known as:  CYMBALTA  Take 120 mg by mouth daily before breakfast.     fluticasone 50 MCG/ACT nasal spray  Commonly known as:  FLONASE  Place 2 sprays into the nose daily.     HYDROcodone-acetaminophen 7.5-325 MG per tablet  Commonly known as:  NORCO  Take 1 tablet by mouth every 6 (six) hours as needed for moderate pain.     LORazepam 0.5 MG tablet  Commonly known as:  ATIVAN  Take 0.5 mg by mouth at bedtime as needed for anxiety.     losartan-hydrochlorothiazide 100-25 MG per tablet  Commonly known as:  HYZAAR  Take 1 tablet by mouth daily before breakfast.     mineral oil-hydrophilic petrolatum ointment  Apply 1 application topically as needed for dry skin.     montelukast 10 MG tablet  Commonly known as:  SINGULAIR  Take 10 mg by mouth daily before breakfast.     omega-3 acid ethyl esters 1 G capsule  Commonly known as:  LOVAZA  Take 1 g by mouth 2 (two) times daily.      simvastatin 10 MG tablet  Commonly known as:  ZOCOR  Take 10 mg by mouth at bedtime.     tizanidine 2 MG capsule  Commonly known as:  ZANAFLEX  Take 1 capsule (2 mg total) by mouth 3 (three) times daily.     traZODone 100 MG tablet  Commonly known as:  DESYREL  Take 200 mg by mouth at bedtime.        Diagnostic Studies: Dg Chest 2 View  07/01/2013   CLINICAL DATA:  Hip surgery  EXAM: CHEST  2 VIEW  COMPARISON:  01/26/2013  FINDINGS: There is thickening and/or atelectasis along the minor fissure on the right, stable. Lungs are otherwise clear. No pleural effusion. No pneumothorax.  Cardiac silhouette is normal in size. Normal mediastinal and hilar contours.  Bony thorax is demineralized but intact.  IMPRESSION: No acute cardiopulmonary disease.   Electronically Signed   By: Lajean Manes M.D.   On: 07/01/2013 13:57   Dg Pelvis Portable  07/09/2013   CLINICAL DATA:  Postoperative hip replacement  EXAM: PORTABLE PELVIS 1-2 VIEWS  COMPARISON:  None.  FINDINGS: There is a total hip prosthesis on the left which appears well-seated. No fracture or dislocation. There is mild narrowing of the right hip joint. There is postoperative change in the lower lumbar spine.  IMPRESSION: Left total hip prosthesis appears well-seated. No acute fracture or dislocation.   Electronically Signed   By: Lowella Grip M.D.   On: 07/09/2013 16:20    Disposition: SNF      Discharge Instructions   Call MD / Call 911    Complete by:  As directed   If you experience chest pain or shortness of breath, CALL 911 and be transported to the hospital emergency room.  If you develope a fever above 101 F, pus (white drainage) or increased drainage or redness at the wound, or calf pain, call your surgeon's office.     Constipation Prevention    Complete by:  As directed   Drink plenty of fluids.  Prune juice may be helpful.  You may use a stool softener, such as Colace (over the counter) 100 mg twice a day.  Use MiraLax  (over the counter) for constipation as needed.  Diet - low sodium heart healthy    Complete by:  As directed      Increase activity slowly as tolerated    Complete by:  As directed            Follow-up Information   Follow up with Kerin Salen, MD In 2 weeks.   Specialty:  Orthopedic Surgery   Contact information:   [place] [address] [phone]        Signed: Larwance Sachs Nida 07/12/2013, 10:51 AM

## 2013-07-12 NOTE — Progress Notes (Signed)
CSW informed that pt is ready for discharged to [facility] today. CSW contacted Shae with [employer] to request insurance authorization. CSW to assist with dc today, pt made aware.  Hunt Oris, MSW, [place]

## 2013-07-12 NOTE — Progress Notes (Signed)
Two small bruises to R arm. Patient complains that BP cuff squeezed her too tight last night and that her arm hurts when she moves it.

## 2013-07-12 NOTE — Progress Notes (Signed)
Subjective: 3 Days Post-Op Procedure(s) (LRB): TOTAL HIP ARTHROPLASTY (Left)  Activity level:  wbat Diet tolerance:  Eating well Voiding:  ok Patient reports pain as 2 on 0-10 scale.    Objective: Vital signs in last 24 hours: Temp:  [97.9 F (36.6 C)-98.1 F (36.7 C)] 98.1 F (36.7 C) (05/25 0635) Pulse Rate:  [101-103] 101 (05/25 0635) Resp:  [16-18] 18 (05/25 0635) BP: (117-147)/(47-49) 129/48 mmHg (05/25 0635) SpO2:  [95 %-98 %] 96 % (05/25 0635)  Labs:  Recent Labs  07/10/13 0406 07/11/13 0544 07/12/13 0415  HGB 11.2* 9.0* 8.4*    Recent Labs  07/11/13 0544 07/12/13 0415  WBC 13.9* 12.8*  RBC 2.99* 2.80*  HCT 26.8* 24.9*  PLT 255 256    Recent Labs  07/10/13 0406 07/11/13 1130  NA 137 131*  K 5.6* 3.9  CL 100 94*  CO2 24 25  BUN 21 23  CREATININE 1.00 1.22*  GLUCOSE 170* 117*  CALCIUM 8.3* 8.4   No results found for this basename: LABPT, INR,  in the last 72 hours  Physical Exam:  Neurologically intact ABD soft Neurovascular intact Sensation intact distally Intact pulses distally Dorsiflexion/Plantar flexion intact Incision: dressing C/D/I and scant drainage No cellulitis present Compartment soft  Assessment/Plan:  3 Days Post-Op Procedure(s) (LRB): TOTAL HIP ARTHROPLASTY (Left) Advance diet Up with therapy Discharge to SNF today. Continue ASA 325mg  BID Follow up in office in 2 weeks as scheduled. Potassium level normalized yesterday.    Larwance Sachs Enio Hornback 07/12/2013, 10:49 AM

## 2013-07-12 NOTE — Consult Note (Addendum)
[facility] Medical Consultation  SHALIAH WANN OZH:[id number] DOB: [birth year] DOA: 07/09/2013 PCP: Rachell Cipro, MD   Requesting physician: Kerin Salen Date of consultation: 07/12/2013 Reason for consultation: Mental Status Changes  Impression/Recommendations Principal Problem:   Arthritis of left hip Active Problems:   Acute delirium    1. Acute delirium. Patient developing increased confusion, mental status changes, agitation, having hallucinations earlier. I suspect this may be related to psychotropic medications as she had been receiving IV Dilaudid, oxycodone, trazodone and Ativan. Underlying infectious process is a possibility although her white count has trended down from 20,500 on 07/10/2013 to 12,800 on 07/12/2013, and she has been afebrile. She was hypotensive this afternoon with blood pressure 96/42 which made have been caused by antihypertensive agents and possibly from dehydration. Would recommend stopping losartan/HCTZ and amlodipine for now. I would recommend giving a 500 mL bolus of NS and discontinuing dilaudid and oxycodone as well, with the scheduling Tylenol thousand milligrams by mouth 3 times a day for pain management. Will bring down her nightly trazodone dose to 100 mg by mouth each bedtime. Also recommend checking a U/A and BMP. Follow surgical incision site closely since there was some localized erythema.   I will followup again tomorrow. Please contact me if I can be of assistance in the meanwhile. Thank you for this consultation.  Chief Complaint: Mental Status Changes  HPI:  Patient is a pleasant 73 year old female with a past medical history of osteoarthritis of left hip, admitted to the orthopedic service on 07/09/2013 for elective total hip arthroplasty. Patient having severe unremitting pain affecting daily activities for which total hip was indicated. She was taken to the operating room on 07/09/2013, tolerated procedure well there are no  immediate complications. She was seen and evaluated by physical therapy who recommended SNF placement. Discharge to skilled nursing facility had been planned for today however over the course of the day patient developing mental status changes, agitation, having hallucinations. During my counter she is able to identify this place as [facility], stated the date correctly, however does feel that she has been increasingly confused over the day. Patient receiving narcotic analgesics for management of pain.                                                              Review of Systems:  Constitutional:  No weight loss, night sweats, Fevers, chills, fatigue.  HEENT:  No headaches, Difficulty swallowing,Tooth/dental problems,Sore throat,  No sneezing, itching, ear ache, nasal congestion, post nasal drip,  Cardio-vascular:  No chest pain, Orthopnea, PND, swelling in lower extremities, anasarca, dizziness, palpitations  GI:  No heartburn, indigestion, abdominal pain, nausea, vomiting, diarrhea, change in bowel habits, loss of appetite  Resp:  No shortness of breath with exertion or at rest. No excess mucus, no productive cough, No non-productive cough, No coughing up of blood.No change in color of mucus.No wheezing.No chest wall deformity  Skin:  no rash or lesions.  GU:  no dysuria, change in color of urine, no urgency or frequency. No flank pain.  Musculoskeletal:  Positive for left hip pain as well as right arm pain.  Psych:  No change in mood or affect. No depression or anxiety. No memory loss.   Past Medical History  Diagnosis Date  .  Hypertension   . Hypercholesterolemia   . Depression   . Anxiety   . Anemia   . Pneumonia     11/2012- hosp. at [facility]  . H/O hiatal hernia   . Arthritis     "entire body"  . Incontinence of urine    Past Surgical History  Procedure Laterality Date  . Right knee arthroscopy    . Abdominal hysterectomy    . 2 synovial cysts removed between  l4-l5    . Tonsillectomy    . Back surgery      back fusion after 2 back surgeries   . Eye surgery      tube inserted for excessive tearing , tube is removed    Social History:  reports that she quit smoking about 50 years ago. Her smoking use included Cigarettes. She smoked 0.00 packs per day. She has never used smokeless tobacco. She reports that she does not drink alcohol or use illicit drugs.  Allergies  Allergen Reactions  . Avelox [Moxifloxacin Hcl In Nacl] Other (See Comments)    Chest pain  . Cephalosporins Hives   Family History  Problem Relation Age of Onset  . Hypertension Mother   . Alcoholism Father     Prior to Admission medications   Medication Sig Start Date End Date Taking? Authorizing Provider  amLODipine (NORVASC) 5 MG tablet Take 5 mg by mouth daily before breakfast.    Yes Historical Provider, MD  aspirin EC 81 MG tablet Take 81 mg by mouth daily.   Yes Historical Provider, MD  calcium carbonate (TUMS EX) 750 MG chewable tablet Chew 2 tablets by mouth daily as needed for heartburn.   Yes Historical Provider, MD  cholecalciferol (VITAMIN D) 1000 UNITS tablet Take 5,000 Units by mouth daily.   Yes Historical Provider, MD  DULoxetine (CYMBALTA) 60 MG capsule Take 120 mg by mouth daily before breakfast.    Yes Historical Provider, MD  fluticasone (FLONASE) 50 MCG/ACT nasal spray Place 2 sprays into the nose daily.   Yes Historical Provider, MD  HYDROcodone-acetaminophen (NORCO/VICODIN) 5-325 MG per tablet Take 1 tablet by mouth daily as needed for pain.   Yes Historical Provider, MD  LORazepam (ATIVAN) 0.5 MG tablet Take 0.5 mg by mouth at bedtime as needed for anxiety.    Yes Historical Provider, MD  losartan-hydrochlorothiazide (HYZAAR) 100-25 MG per tablet Take 1 tablet by mouth daily before breakfast.    Yes Historical Provider, MD  mineral oil-hydrophilic petrolatum (AQUAPHOR) ointment Apply 1 application topically as needed for dry skin.   Yes Historical  Provider, MD  montelukast (SINGULAIR) 10 MG tablet Take 10 mg by mouth daily before breakfast.    Yes Historical Provider, MD  omega-3 acid ethyl esters (LOVAZA) 1 G capsule Take 1 g by mouth 2 (two) times daily.    Yes Historical Provider, MD  simvastatin (ZOCOR) 10 MG tablet Take 10 mg by mouth at bedtime.   Yes Historical Provider, MD  traZODone (DESYREL) 100 MG tablet Take 200 mg by mouth at bedtime.    Yes Historical Provider, MD  aspirin EC 325 MG tablet Take 1 tablet (325 mg total) by mouth 2 (two) times daily. 07/09/13   Leighton Parody, PA-C  oxyCODONE-acetaminophen (ROXICET) 5-325 MG per tablet Take 1 tablet by mouth every 4 (four) hours as needed. 07/09/13   Leighton Parody, PA-C  tizanidine (ZANAFLEX) 2 MG capsule Take 1 capsule (2 mg total) by mouth 3 (three) times daily. 07/09/13   Randall Hiss  Callie Fielding, PA-C   Physical Exam: Blood pressure 96/42, pulse 89, temperature 98.3 F (36.8 C), temperature source Oral, resp. rate 18, height 5' 6.5" (1.689 m), weight 91.1 kg (200 lb 13.4 oz), SpO2 95.00%. Filed Vitals:   07/12/13 1548  BP:   Pulse:   Temp: 98.3 F (36.8 C)  Resp:      General:  Patient is awake alert oriented, did respond to my questions appropriately  Eyes: Pupils are equal round and reactive to light extraocular movement  Neck: Neck is supple symmetrical no jugular venous distention  Cardiovascular: Regular rate and rhythm normal S1-S2  Respiratory: Clear to auscultation bilaterally  Abdomen: Soft nontender nondistended  Skin: Erythema around surgical incision site of her left hip was noted, no hematoma or fluctuant mass, area was tender to palpation  Musculoskeletal: Status post left hip surgery, as noted above there was erythema localized to area. Patient complain of pain to palpation of left hip  Psychiatric: She is awake, alert, did respond to my questions appropriately.  Neurologic: Cranial nerves II through XII are grossly intact no alteration to sensation  global 5 out of 5 muscle strength  Labs on Admission:  Basic Metabolic Panel:  Recent Labs Lab 07/10/13 0406 07/11/13 1130  NA 137 131*  K 5.6* 3.9  CL 100 94*  CO2 24 25  GLUCOSE 170* 117*  BUN 21 23  CREATININE 1.00 1.22*  CALCIUM 8.3* 8.4   Liver Function Tests: No results found for this basename: AST, ALT, ALKPHOS, BILITOT, PROT, ALBUMIN,  in the last 168 hours No results found for this basename: LIPASE, AMYLASE,  in the last 168 hours No results found for this basename: AMMONIA,  in the last 168 hours CBC:  Recent Labs Lab 07/10/13 0406 07/11/13 0544 07/12/13 0415  WBC 20.5* 13.9* 12.8*  HGB 11.2* 9.0* 8.4*  HCT 35.0* 26.8* 24.9*  MCV 91.1 89.6 88.9  PLT 322 255 256   Cardiac Enzymes: No results found for this basename: CKTOTAL, CKMB, CKMBINDEX, TROPONINI,  in the last 168 hours BNP: No components found with this basename: POCBNP,  CBG: No results found for this basename: GLUCAP,  in the last 168 hours  Radiological Exams on Admission: No results found.    Time spent: 51 min  [facility] Pager [phone]  If 7PM-7AM, please contact night-coverage [email or web address] Password [email or web address] 07/12/2013, 5:10 PM

## 2013-07-13 ENCOUNTER — Encounter (HOSPITAL_COMMUNITY): Payer: Self-pay | Admitting: Orthopedic Surgery

## 2013-07-13 LAB — BASIC METABOLIC PANEL
BUN: 17 mg/dL (ref 6–23)
CO2: 26 mEq/L (ref 19–32)
Calcium: 9.1 mg/dL (ref 8.4–10.5)
Chloride: 96 mEq/L (ref 96–112)
Creatinine, Ser: 0.82 mg/dL (ref 0.50–1.10)
GFR calc Af Amer: 81 mL/min — ABNORMAL LOW (ref >90)
GFR calc non Af Amer: 70 mL/min — ABNORMAL LOW (ref >90)
Glucose, Bld: 146 mg/dL — ABNORMAL HIGH (ref 70–99)
Potassium: 4.1 mEq/L (ref 3.7–5.3)
Sodium: 135 mEq/L — ABNORMAL LOW (ref 137–147)

## 2013-07-13 LAB — CBC
HCT: 29.5 % — ABNORMAL LOW (ref 36.0–46.0)
Hemoglobin: 9.6 g/dL — ABNORMAL LOW (ref 12.0–15.0)
MCH: 29.2 pg (ref 26.0–34.0)
MCHC: 32.5 g/dL (ref 30.0–36.0)
MCV: 89.7 fL (ref 78.0–100.0)
Platelets: 377 10*3/uL (ref 150–400)
RBC: 3.29 MIL/uL — ABNORMAL LOW (ref 3.87–5.11)
RDW: 15.2 % (ref 11.5–15.5)
WBC: 14.7 10*3/uL — ABNORMAL HIGH (ref 4.0–10.5)

## 2013-07-13 MED ORDER — HYDROCODONE-ACETAMINOPHEN 7.5-325 MG PO TABS: 1.0000 | ORAL_TABLET | Freq: Four times a day (QID) | ORAL | Status: DC | PRN

## 2013-07-13 NOTE — Progress Notes (Addendum)
CSW (Clinical Social Worker) updated [employer] and confirmed with facility that pt could be discharged. CSW arranged non-emergent ambulance transport. Pt, pt nurse, and facility notified. Pt informed CSW she would notify family. CSW signing off.  [place], [place]

## 2013-07-13 NOTE — Progress Notes (Addendum)
Patient ID: Shelly Hickman, female   DOB: 1940-04-10, 73 y.o.   MRN: [id number] PATIENT ID: Shelly Hickman  MRN: [id number]  DOB/AGE:  04/15/1940 / 73 y.o.  4 Days Post-Op Procedure(s) (LRB): TOTAL HIP ARTHROPLASTY (Left)    PROGRESS NOTE Subjective: Patient is alert, oriented,no Nausea, no Vomiting, yes passing gas, no Bowel Movement. Taking PO well. Denies SOB, Chest or Calf Pain. Using [employer]. Ambulate in hallway yesterday without difficulty and pain is diminishing. She had trouble with delirium yesterday, narcotics were discontinued trazodone dose was diminished and she is thinking clearly today. She feels she is ready to go to [facility] for rehabilitation today.   Objective: Vital signs in last 24 hours: Filed Vitals:   07/12/13 2000 07/12/13 2143 07/13/13 0000 07/13/13 0334  BP:  110/54    Pulse:  82    Temp:  98.3 F (36.8 C)    TempSrc:  Oral    Resp: 16 18 16 12   Height:      Weight:      SpO2: 98% 97% 98% 96%      Intake/Output from previous day: I/O last 3 completed shifts: In: 1080 [P.O.:1080] Out: -    Intake/Output this shift:     LABORATORY DATA:  Recent Labs  07/11/13 0544  07/11/13 1130 07/12/13 0415 07/12/13 1906  WBC 13.9*  --   --  12.8*  --   HGB 9.0*  --   --  8.4*  --   HCT 26.8*  --   --  24.9*  --   PLT 255  --   --  256  --   NA  --   --  131*  --  131*  K  --   --  3.9  --  3.9  CL  --   --  94*  --  94*  CO2  --   --  25  --  26  BUN  --   --  23  --  20  CREATININE  --   --  1.22*  --  1.10  GLUCOSE  --   --  117*  --  146*  CALCIUM  --   < > 8.4  --  8.7  < > = values in this interval not displayed.  Examination: Neurologically intact ABD soft Neurovascular intact Sensation intact distally Intact pulses distally Dorsiflexion/Plantar flexion intact Incision: no drainage No cellulitis present Compartment soft} XR AP&Lat of hip shows well placed\fixed THA  Assessment:   4 Days Post-Op Procedure(s)  (LRB): TOTAL HIP ARTHROPLASTY (Left) ADDITIONAL DIAGNOSIS:  Hypertension, depression and anxiety  Plan: PT/OT WBAT, THA  posterior precautions  DVT Prophylaxis: SCDx72 hrs, ASA 325 mg BID x 2 weeks, will discontinue Percocet on discharge and restart 7.5 norco that she was taking at home.  DISCHARGE PLAN: Skilled Nursing Facility/Rehab, [facility] if they're by hospitalist for discharge today.  DISCHARGE NEEDS: HHRN, CPM, Walker and 3-in-1 comode seat

## 2013-07-13 NOTE — Care Management Note (Signed)
CARE MANAGEMENT NOTE 07/13/2013  Patient:  Shelly Hickman, Shelly Hickman   Account Number:  [id number]  Date Initiated:  07/13/2013  Documentation initiated by:  Ricki Miller  Subjective/Objective Assessment:   73 yr old female s/p Left total hip arthroplasty.     Action/Plan:   patient will need shorterm rehab at [facility]. Patient will go to [facility].Social worker has arranged.   Anticipated DC Date:  07/13/2013   Anticipated DC Plan:  SKILLED NURSING FACILITY  In-house referral  Clinical Social Worker      DC Planning Services  CM consult      Choice offered to / List presented to:             Status of service:  Completed, signed off Medicare Important Message given?  NA - LOS <3 / Initial given by admissions (If response is "NO", the following Medicare IM given date fields will be blank) Date Medicare IM given:  07/01/2013 Date Additional Medicare IM given:    Discharge Disposition:  [place]  Per UR Regulation:  Reviewed for med. necessity/level of care/duration of stay

## 2013-07-13 NOTE — Progress Notes (Signed)
[facility]                                                                              Patient Demographics  Shelly Hickman, is a 73 y.o. female, DOB - 02/25/1940, YHC:[id number]  Admit date - 07/09/2013   Admitting Physician Kerin Salen, MD  Outpatient Primary MD for the patient is [facility], [place]  LOS - 4   HPI:  Patient is a pleasant 73 year old female with a past medical history of osteoarthritis of left hip, admitted to the orthopedic service on 07/09/2013 for elective total hip arthroplasty. Patient was having severe unremitting pain affecting daily activities for which total hip was indicated. She was taken to the operating room on 07/09/2013, tolerated procedure well there are no immediate complications. She was seen and evaluated by physical therapy who recommended SNF placement. Discharge to skilled nursing facility had been planned. However over the course of the day patient developing mental status changes, agitation, having hallucinations.    Assessment & Plan  Altered Mental status/ Acute Delirium -Resolved -Likely secondary to narcotics including IV Dilaudid, oxycodone and penicillin medications including Ativan and trazodone -Currently patient is alert and oriented x4 -Patient also had transient hypotension however is currently normotensive at this point. -UA was negative -At this time would avoid narcotics -Patient is medically stable for discharge  Arthritis of left hip -Patient underwent total hip arthroplasty -Orthopedics his primary service  Code Status: Full  Family Communication: Husband at bedside  Disposition Plan: Patient is to be discharged today to nursing home by orthopedics.  Time Spent in minutes   30 minutes  Procedures  Total hip arthroplasty  Consults   TRH   Lab Results  Component Value Date   PLT 256 07/12/2013    Medications  Scheduled Meds: . acetaminophen  1,000 mg Oral TID  . aspirin EC  325 mg Oral Q  breakfast  . docusate sodium  100 mg Oral BID  . DULoxetine  120 mg Oral QAC breakfast  . fluticasone  2 spray Each Nare Daily  . montelukast  10 mg Oral QAC breakfast  . omega-3 acid ethyl esters  1 g Oral BID  . simvastatin  10 mg Oral QHS  . traZODone  100 mg Oral QHS   Continuous Infusions: . sodium chloride     PRN Meds:.aluminum hydroxide, bisacodyl, LORazepam, menthol-cetylpyridinium, methocarbamol (ROBAXIN) IV, methocarbamol, metoCLOPramide (REGLAN) injection, metoCLOPramide, mineral oil-hydrophilic petrolatum, ondansetron (ZOFRAN) IV, ondansetron, phenol, senna-docusate  Antibiotics    Anti-infectives   Start     Dose/Rate Route Frequency Ordered Stop   07/09/13 0600  vancomycin (VANCOCIN) IVPB 1000 mg/200 mL premix     1,000 mg 200 mL/hr over 60 Minutes Intravenous On call to O.R. 07/08/13 1404 07/09/13 1244      Subjective:   [name] seen and examined today.  Patient has no complaints today she does remember being somewhat different yesterday. She denies any pain at this time. She is anxious and eager to go to rehabilitation.    Objective:   Filed Vitals:   07/12/13 2143 07/13/13 0000 07/13/13 0334 07/13/13 0718  BP: 110/54   113/48  Pulse: 82   92  Temp:  98.3 F (36.8 C)   97.3 F (36.3 C)  TempSrc: Oral   Oral  Resp: 18 16 12 16   Height:      Weight:      SpO2: 97% 98% 96% 99%    Wt Readings from Last 3 Encounters:  07/09/13 91.1 kg (200 lb 13.4 oz)  07/09/13 91.1 kg (200 lb 13.4 oz)  07/01/13 91.128 kg (200 lb 14.4 oz)     Intake/Output Summary (Last 24 hours) at 07/13/13 0847 Last data filed at 07/12/13 1700  Gross per 24 hour  Intake    480 ml  Output      0 ml  Net    480 ml    Exam  General: Well developed, well nourished, NAD, appears stated age  HEENT: NCAT, PERRLA, EOMI, Anicteic Sclera, mucous membranes moist.   Neck: Supple, no JVD, no masses  Cardiovascular: S1 S2 auscultated, no rubs, murmurs or gallops. Regular rate  and rhythm.  Respiratory: Clear to auscultation bilaterally with equal chest rise  Abdomen: Soft, nontender, nondistended, + bowel sounds  Extremities: warm dry without cyanosis clubbing or edema  Neuro: AAOx3, cranial nerves grossly intact. Strength 5/5 in patient's upper and lower extremities bilaterally  Skin: Without rashes exudates or nodules, bruising around surgical incision of the left hip  Psych: Normal affect and demeanor with intact judgement and insight    Data Review   Micro Results No results found for this or any previous visit (from the past 240 hour(s)).  Radiology Reports Dg Chest 2 View  07/01/2013   CLINICAL DATA:  Hip surgery  EXAM: CHEST  2 VIEW  COMPARISON:  01/26/2013  FINDINGS: There is thickening and/or atelectasis along the minor fissure on the right, stable. Lungs are otherwise clear. No pleural effusion. No pneumothorax.  Cardiac silhouette is normal in size. Normal mediastinal and hilar contours.  Bony thorax is demineralized but intact.  IMPRESSION: No acute cardiopulmonary disease.   Electronically Signed   By: Lajean Manes M.D.   On: 07/01/2013 13:57   Dg Pelvis Portable  07/09/2013   CLINICAL DATA:  Postoperative hip replacement  EXAM: PORTABLE PELVIS 1-2 VIEWS  COMPARISON:  None.  FINDINGS: There is a total hip prosthesis on the left which appears well-seated. No fracture or dislocation. There is mild narrowing of the right hip joint. There is postoperative change in the lower lumbar spine.  IMPRESSION: Left total hip prosthesis appears well-seated. No acute fracture or dislocation.   Electronically Signed   By: Lowella Grip M.D.   On: 07/09/2013 16:20    CBC  Recent Labs Lab 07/10/13 0406 07/11/13 0544 07/12/13 0415  WBC 20.5* 13.9* 12.8*  HGB 11.2* 9.0* 8.4*  HCT 35.0* 26.8* 24.9*  PLT 322 255 256  MCV 91.1 89.6 88.9  MCH 29.2 30.1 30.0  MCHC 32.0 33.6 33.7  RDW 16.1* 15.5 15.1    Chemistries   Recent Labs Lab 07/10/13 0406  07/11/13 1130 07/12/13 1906  NA 137 131* 131*  K 5.6* 3.9 3.9  CL 100 94* 94*  CO2 24 25 26   GLUCOSE 170* 117* 146*  BUN 21 23 20   CREATININE 1.00 1.22* 1.10  CALCIUM 8.3* 8.4 8.7   ------------------------------------------------------------------------------------------------------------------ estimated creatinine clearance is 53.8 ml/min (by C-G formula based on Cr of 1.1). ------------------------------------------------------------------------------------------------------------------ No results found for this basename: HGBA1C,  in the last 72 hours ------------------------------------------------------------------------------------------------------------------ No results found for this basename: CHOL, HDL, LDLCALC, TRIG, CHOLHDL, LDLDIRECT,  in the last 72 hours ------------------------------------------------------------------------------------------------------------------  No results found for this basename: TSH, T4TOTAL, FREET3, T3FREE, THYROIDAB,  in the last 72 hours ------------------------------------------------------------------------------------------------------------------ No results found for this basename: VITAMINB12, FOLATE, FERRITIN, TIBC, IRON, RETICCTPCT,  in the last 72 hours  Coagulation profile No results found for this basename: INR, PROTIME,  in the last 168 hours  No results found for this basename: DDIMER,  in the last 72 hours  Cardiac Enzymes No results found for this basename: CK, CKMB, TROPONINI, MYOGLOBIN,  in the last 168 hours ------------------------------------------------------------------------------------------------------------------ No components found with this basename: POCBNP,     Erling Arrazola D.O. on 07/13/2013 at 8:47 AM  Between 7am to 7pm - Pager - [phone]  After 7pm go to [email or web address] - password [email or web address]  And look for the night coverage person covering for me after hours  [facility] Office   [phone]

## 2013-07-13 NOTE — Progress Notes (Signed)
Agree with SPT.    [place] Kretz, PT [phone]  

## 2013-07-13 NOTE — Progress Notes (Signed)
Physical Therapy Treatment Patient Details Name: Shelly Hickman MRN: [id number] DOB: 12/20/1940 Today's Date: 07/13/2013    History of Present Illness 73 yo F admitted for Left posterior THA.    PT Comments    Pt ambulated 50 ft with RW and min guard today, distance limited by pain.  Pt performed sit to/from stand transfers with min guard for safety but required no VC.  Pt recalled 3/3 posterior hip precautions today.  Upon returning to sit in chair, pt tolerated seated LE exercises well.  Will continue to follow.   Follow Up Recommendations  SNF;Supervision for mobility/OOB     Equipment Recommendations  Rolling walker with 5" wheels    Recommendations for Other Services       Precautions / Restrictions Precautions Precautions: Posterior Hip Precaution Comments: Pt able to recall 3/3 hip precautions Restrictions Weight Bearing Restrictions: Yes LLE Weight Bearing: Weight bearing as tolerated    Mobility  Bed Mobility               General bed mobility comments: Pt seated in chair upon PT arrival and departure  Transfers Overall transfer level: Needs assistance Equipment used: Rolling walker (2 wheeled) Transfers: Sit to/from Stand Sit to Stand: Min guard         General transfer comment: No VC necessary, pt slow to stand due to pain.  Min guard for safety.  Ambulation/Gait Ambulation/Gait assistance: Min guard Ambulation Distance (Feet): 50 Feet Assistive device: Rolling walker (2 wheeled) Gait Pattern/deviations: Step-to pattern;Decreased stance time - left;Decreased step length - right Gait velocity: decreased Gait velocity interpretation: Below normal speed for age/gender General Gait Details: Distance limited by increased pain.  No VC required for gait, min guard for safety due to pt pain level.   Stairs            Wheelchair Mobility    Modified Rankin (Stroke Patients Only)       Balance Overall balance assessment: Needs  assistance Sitting-balance support: Feet supported Sitting balance-Leahy Scale: Fair     Standing balance support: Bilateral upper extremity supported;During functional activity Standing balance-Leahy Scale: Poor Standing balance comment: Pt required bil. UE support on RW throughout standing and gait activities to maintain balance.                    Cognition Arousal/Alertness: Awake/alert Behavior During Therapy: WFL for tasks assessed/performed Overall Cognitive Status: Within Functional Limits for tasks assessed                      Exercises Total Joint Exercises Ankle Circles/Pumps: AROM;Both;20 reps;Seated Quad Sets: AROM;Both;5 reps;Seated Long Arc Quad: AROM;Both;5 reps;Seated    General Comments General comments (skin integrity, edema, etc.): Pt continues to have c/o pain in R arm today which she believes is due to the blood pressure cuff from the evening of 5/24.      Pertinent Vitals/Pain Pt reports increased pain this AM. Nurse notified of pt request for pain meds.    Home Living                      Prior Function            PT Goals (current goals can now be found in the care plan section) Acute Rehab PT Goals PT Goal Formulation: With patient Time For Goal Achievement: 07/17/13 Potential to Achieve Goals: Good Progress towards PT goals: Progressing toward goals    Frequency  7X/week  PT Plan Current plan remains appropriate    Co-evaluation             End of Session Equipment Utilized During Treatment: Gait belt Activity Tolerance: Patient limited by pain Patient left: in chair;with call bell/phone within reach     Time: 0812-0835 PT Time Calculation (min): 23 min  Charges:                       G Codes:      Morey Andonian, SPT 07/13/2013, 8:47 AM

## 2013-07-13 NOTE — Discharge Summary (Signed)
Patient ID: Shelly Hickman MRN: [id number] DOB/AGE: 05/25/1940 73 y.o.  Admit date: 07/09/2013 Discharge date: 07/13/2013  Admission Diagnoses:  Principal Problem:   Arthritis of left hip Active Problems:   Acute delirium   Discharge Diagnoses:  Same  Past Medical History  Diagnosis Date  . Hypertension   . Hypercholesterolemia   . Depression   . Anxiety   . Anemia   . Pneumonia     11/2012- hosp. at [facility]  . H/O hiatal hernia   . Arthritis     "entire body"  . Incontinence of urine     Surgeries: Procedure(s): TOTAL HIP ARTHROPLASTY on 07/09/2013   Consultants: Treatment Team:  Kelvin Cellar, MD  Discharged Condition: Improved  Hospital Course: Shelly Hickman is an 73 y.o. female who was admitted 07/09/2013 for operative treatment ofArthritis of left hip. Patient has severe unremitting pain that affects sleep, daily activities, and work/hobbies. After pre-op clearance the patient was taken to the operating room on 07/09/2013 and underwent  Procedure(s): TOTAL HIP ARTHROPLASTY.    Patient was given perioperative antibiotics: Anti-infectives   Start     Dose/Rate Route Frequency Ordered Stop   07/09/13 0600  vancomycin (VANCOCIN) IVPB 1000 mg/200 mL premix     1,000 mg 200 mL/hr over 60 Minutes Intravenous On call to O.R. 07/08/13 1404 07/09/13 1244       Patient was given sequential compression devices, early ambulation, and chemoprophylaxis to prevent DVT.  Patient benefited maximally from hospital stay and there were no complications.    Recent vital signs: Patient Vitals for the past 24 hrs:  BP Temp Temp src Pulse Resp SpO2  07/13/13 0718 113/48 mmHg 97.3 F (36.3 C) Oral 92 16 99 %  07/13/13 0334 - - - - 12 96 %  07/13/13 0000 - - - - 16 98 %  07/12/13 2143 110/54 mmHg 98.3 F (36.8 C) Oral 82 18 97 %  07/12/13 2000 - - - - 16 98 %  07/12/13 1548 - 98.3 F (36.8 C) Oral - - -  07/12/13 1504 - - - - 18 -  07/12/13 1404 96/42 mmHg 98.4 F (36.9  C) - 89 18 95 %  07/12/13 1320 - - - - 18 -     Recent laboratory studies:  Recent Labs  07/12/13 0415 07/12/13 1906 07/13/13 0905  WBC 12.8*  --  14.7*  HGB 8.4*  --  9.6*  HCT 24.9*  --  29.5*  PLT 256  --  377  NA  --  131* 135*  K  --  3.9 4.1  CL  --  94* 96  CO2  --  26 26  BUN  --  20 17  CREATININE  --  1.10 0.82  GLUCOSE  --  146* 146*  CALCIUM  --  8.7 9.1     Discharge Medications:     Medication List    STOP taking these medications       HYDROcodone-acetaminophen 5-325 MG per tablet  Commonly known as:  NORCO/VICODIN  Replaced by:  HYDROcodone-acetaminophen 7.5-325 MG per tablet      TAKE these medications       amLODipine 5 MG tablet  Commonly known as:  NORVASC  Take 5 mg by mouth daily before breakfast.     aspirin EC 325 MG tablet  Take 1 tablet (325 mg total) by mouth 2 (two) times daily.     calcium carbonate 750 MG chewable tablet  Commonly known  as:  TUMS EX  Chew 2 tablets by mouth daily as needed for heartburn.     cholecalciferol 1000 UNITS tablet  Commonly known as:  VITAMIN D  Take 5,000 Units by mouth daily.     DULoxetine 60 MG capsule  Commonly known as:  CYMBALTA  Take 120 mg by mouth daily before breakfast.     fluticasone 50 MCG/ACT nasal spray  Commonly known as:  FLONASE  Place 2 sprays into the nose daily.     HYDROcodone-acetaminophen 7.5-325 MG per tablet  Commonly known as:  NORCO  Take 1 tablet by mouth every 6 (six) hours as needed for moderate pain.     LORazepam 0.5 MG tablet  Commonly known as:  ATIVAN  Take 0.5 mg by mouth at bedtime as needed for anxiety.     losartan-hydrochlorothiazide 100-25 MG per tablet  Commonly known as:  HYZAAR  Take 1 tablet by mouth daily before breakfast.     mineral oil-hydrophilic petrolatum ointment  Apply 1 application topically as needed for dry skin.     montelukast 10 MG tablet  Commonly known as:  SINGULAIR  Take 10 mg by mouth daily before breakfast.      omega-3 acid ethyl esters 1 G capsule  Commonly known as:  LOVAZA  Take 1 g by mouth 2 (two) times daily.     simvastatin 10 MG tablet  Commonly known as:  ZOCOR  Take 10 mg by mouth at bedtime.     tizanidine 2 MG capsule  Commonly known as:  ZANAFLEX  Take 1 capsule (2 mg total) by mouth 3 (three) times daily.     traZODone 100 MG tablet  Commonly known as:  DESYREL  Take 200 mg by mouth at bedtime.        Diagnostic Studies: Dg Chest 2 View  07/01/2013   CLINICAL DATA:  Hip surgery  EXAM: CHEST  2 VIEW  COMPARISON:  01/26/2013  FINDINGS: There is thickening and/or atelectasis along the minor fissure on the right, stable. Lungs are otherwise clear. No pleural effusion. No pneumothorax.  Cardiac silhouette is normal in size. Normal mediastinal and hilar contours.  Bony thorax is demineralized but intact.  IMPRESSION: No acute cardiopulmonary disease.   Electronically Signed   By: Lajean Manes M.D.   On: 07/01/2013 13:57   Dg Pelvis Portable  07/09/2013   CLINICAL DATA:  Postoperative hip replacement  EXAM: PORTABLE PELVIS 1-2 VIEWS  COMPARISON:  None.  FINDINGS: There is a total hip prosthesis on the left which appears well-seated. No fracture or dislocation. There is mild narrowing of the right hip joint. There is postoperative change in the lower lumbar spine.  IMPRESSION: Left total hip prosthesis appears well-seated. No acute fracture or dislocation.   Electronically Signed   By: Lowella Grip M.D.   On: 07/09/2013 16:20    Disposition: 01-Home or Self Care      Discharge Instructions   Call MD / Call 911    Complete by:  As directed   If you experience chest pain or shortness of breath, CALL 911 and be transported to the hospital emergency room.  If you develope a fever above 101 F, pus (white drainage) or increased drainage or redness at the wound, or calf pain, call your surgeon's office.     Change dressing    Complete by:  As directed   You may change your dressing  on day 5, then change the dressing daily with sterile  4 x 4 inch gauze dressing and paper tape.  You may clean the incision with alcohol prior to redressing     Constipation Prevention    Complete by:  As directed   Drink plenty of fluids.  Prune juice may be helpful.  You may use a stool softener, such as Colace (over the counter) 100 mg twice a day.  Use MiraLax (over the counter) for constipation as needed.     Diet - low sodium heart healthy    Complete by:  As directed      Discharge instructions    Complete by:  As directed   Follow up in 2 weeks with Dr. Mayer Camel     Driving restrictions    Complete by:  As directed   No driving for 2 weeks     Follow the hip precautions as taught in Physical Therapy    Complete by:  As directed      Increase activity slowly as tolerated    Complete by:  As directed      Patient may shower    Complete by:  As directed   You may shower without a dressing once there is no drainage.  Do not wash over the wound.  If drainage remains, cover wound with plastic wrap and then shower.           Follow-up Information   Follow up with Kerin Salen, MD In 2 weeks.   Specialty:  Orthopedic Surgery   Contact information:   [place] [address] [phone]        Signed: Leighton Parody 07/13/2013, 12:46 PM

## 2013-07-14 ENCOUNTER — Other Ambulatory Visit: Payer: Self-pay | Admitting: *Deleted

## 2013-07-14 ENCOUNTER — Encounter: Payer: Self-pay | Admitting: Adult Health

## 2013-07-14 ENCOUNTER — Non-Acute Institutional Stay (SKILLED_NURSING_FACILITY): Payer: Commercial Managed Care - HMO | Admitting: Adult Health

## 2013-07-14 DIAGNOSIS — Z96642 Presence of left artificial hip joint: Secondary | ICD-10-CM

## 2013-07-14 DIAGNOSIS — F419 Anxiety disorder, unspecified: Secondary | ICD-10-CM

## 2013-07-14 DIAGNOSIS — M1610 Unilateral primary osteoarthritis, unspecified hip: Secondary | ICD-10-CM

## 2013-07-14 DIAGNOSIS — Z96649 Presence of unspecified artificial hip joint: Secondary | ICD-10-CM

## 2013-07-14 DIAGNOSIS — F411 Generalized anxiety disorder: Secondary | ICD-10-CM

## 2013-07-14 DIAGNOSIS — M1612 Unilateral primary osteoarthritis, left hip: Secondary | ICD-10-CM

## 2013-07-14 DIAGNOSIS — I10 Essential (primary) hypertension: Secondary | ICD-10-CM

## 2013-07-14 DIAGNOSIS — K582 Mixed irritable bowel syndrome: Secondary | ICD-10-CM | POA: Insufficient documentation

## 2013-07-14 DIAGNOSIS — K5900 Constipation, unspecified: Secondary | ICD-10-CM

## 2013-07-14 DIAGNOSIS — J309 Allergic rhinitis, unspecified: Secondary | ICD-10-CM | POA: Insufficient documentation

## 2013-07-14 DIAGNOSIS — G4700 Insomnia, unspecified: Secondary | ICD-10-CM

## 2013-07-14 DIAGNOSIS — E785 Hyperlipidemia, unspecified: Secondary | ICD-10-CM

## 2013-07-14 MED ORDER — POLYETHYLENE GLYCOL 3350 17 GM/SCOOP PO POWD: 17.0000 g | Freq: Every day | ORAL | Status: DC

## 2013-07-14 MED ORDER — HYDROCODONE-ACETAMINOPHEN 7.5-325 MG PO TABS: 1.0000 | ORAL_TABLET | ORAL | Status: DC | PRN

## 2013-07-14 MED ORDER — TIZANIDINE HCL 2 MG PO CAPS: 2.0000 mg | ORAL_CAPSULE | Freq: Four times a day (QID) | ORAL | Status: DC

## 2013-07-14 NOTE — Telephone Encounter (Signed)
[facility] 

## 2013-07-14 NOTE — Progress Notes (Signed)
Patient ID: Shelly Hickman, female   DOB: 17-Sep-1940, 73 y.o.   MRN: [id number]     [facility]  Allergies  Allergen Reactions  . Avelox [Moxifloxacin Hcl In Nacl] Other (See Comments)    Chest pain  . Cephalosporins Hives     Chief Complaint  Patient presents with  . Hospitalization Follow-up    HPI:  She has been hospitalized for a left hip replacement due to osteoarthritis. She is here for short term rehab and will return back home. Her pain at this time is not being adequately managed and will require medication adjustment.    Past Medical History  Diagnosis Date  . Hypertension   . Hypercholesterolemia   . Depression   . Anxiety   . Anemia   . Pneumonia     11/2012- hosp. at [facility]  . H/O hiatal hernia   . Arthritis     "entire body"  . Incontinence of urine     Past Surgical History  Procedure Laterality Date  . Right knee arthroscopy    . Abdominal hysterectomy    . 2 synovial cysts removed between l4-l5    . Tonsillectomy    . Back surgery      back fusion after 2 back surgeries   . Eye surgery      tube inserted for excessive tearing , tube is removed   . Total hip arthroplasty Left 07/09/2013    Procedure: TOTAL HIP ARTHROPLASTY;  Surgeon: Kerin Salen, MD;  Location: [place];  Service: Orthopedics;  Laterality: Left;    VITAL SIGNS BP 110/73  Pulse 70  Ht 5' 6.5" (1.689 m)  Wt 206 lb (93.441 kg)  BMI 32.76 kg/m2   Patient's Medications  New Prescriptions   No medications on file  Previous Medications   AMLODIPINE (NORVASC) 5 MG TABLET    Take 5 mg by mouth daily before breakfast.    ASPIRIN EC 325 MG TABLET    Take 1 tablet (325 mg total) by mouth 2 (two) times daily.   CALCIUM CARBONATE (TUMS EX) 750 MG CHEWABLE TABLET    Chew 2 tablets by mouth daily as needed for heartburn.   CHOLECALCIFEROL (VITAMIN D) 1000 UNITS TABLET    Take 5,000 Units by mouth daily.   DULOXETINE (CYMBALTA) 60 MG CAPSULE    Take 120 mg by mouth daily before  breakfast.    FLUTICASONE (FLONASE) 50 MCG/ACT NASAL SPRAY    Place 2 sprays into the nose daily.   HYDROCODONE-ACETAMINOPHEN (NORCO) 7.5-325 MG PER TABLET    Take 1 tablet by mouth every 6 (six) hours as needed for moderate pain.   LORAZEPAM (ATIVAN) 0.5 MG TABLET    Take 0.5 mg by mouth at bedtime as needed for anxiety.    LOSARTAN-HYDROCHLOROTHIAZIDE (HYZAAR) 100-25 MG PER TABLET    Take 1 tablet by mouth daily before breakfast.    MINERAL OIL-HYDROPHILIC PETROLATUM (AQUAPHOR) OINTMENT    Apply 1 application topically as needed for dry skin.   MONTELUKAST (SINGULAIR) 10 MG TABLET    Take 10 mg by mouth daily before breakfast.    OMEGA-3 ACID ETHYL ESTERS (LOVAZA) 1 G CAPSULE    Take 1 g by mouth 2 (two) times daily.    SIMVASTATIN (ZOCOR) 10 MG TABLET    Take 10 mg by mouth at bedtime.   TIZANIDINE (ZANAFLEX) 2 MG CAPSULE    Take 1 capsule (2 mg total) by mouth 3 (three) times daily.   TRAZODONE (DESYREL)  100 MG TABLET    Take 200 mg by mouth at bedtime.   Modified Medications   No medications on file  Discontinued Medications   No medications on file    SIGNIFICANT DIAGNOSTIC EXAMS  07-01-13: chest x-ray: No acute cardiopulmonary disease.    07-09-13: pelvic x-ray: Left total hip prosthesis appears well-seated. No acute fracture or dislocation.  LABS REVIEWED:   07-01-13: wbc 10.0; hgb 12.9; hct 39.5; mcv 88.6; plt 399; glucose 100; bun 18; creat 0.86; k+3.8;  na++ 141 07-13-13: wbc 14.7; hgb 9.6; hct 29.5; mcv 89.7; plt 377; glucose 146; bun 17; creat 0.82; k+4.1; na++135     Review of Systems  Constitutional: Negative for malaise/fatigue.  Respiratory: Negative for cough and shortness of breath.   Cardiovascular: Negative for chest pain, palpitations and leg swelling.  Gastrointestinal: Positive for constipation. Negative for heartburn and abdominal pain.  Musculoskeletal: Positive for joint pain. Negative for myalgias.       Her left hip pain is 8/10  Skin: Negative.     Neurological: Negative for headaches.  Psychiatric/Behavioral: Negative for depression. The patient is not nervous/anxious.      Physical Exam  Constitutional: She is oriented to person, place, and time. She appears well-developed and well-nourished. No distress.  obese  Neck: Neck supple. No JVD present.  Cardiovascular: Normal rate, regular rhythm and intact distal pulses.   Respiratory: Effort normal and breath sounds normal. No respiratory distress. She has no wheezes.  GI: Soft. Bowel sounds are normal. She exhibits no distension. There is no tenderness.  Musculoskeletal: She exhibits no edema.  Is able to move all extremities; is status post left hip replacement   Neurological: She is alert and oriented to person, place, and time.  Skin: Skin is warm and dry. She is not diaphoretic.  Left hip incision line without signs of infection present; has significant bruising present.   Psychiatric: She has a normal mood and affect.     ASSESSMENT/ PLAN:  1. 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 Constipation; will give her a dulcolax supp now; will then begin miralax 17 gm daily and will monitor  3. Hypertension: is stable will continue norvasc 5 mg daily and will continue hyzaar 100/25 mg daily will monitor   4. Dyslipidemia: will continue zocor 10 mg daily and lovaza 1 gm twice daily   5. Allergic rhinitis: will continue flonase daily and singulair 10 mg daily   6. Anxiety: she is presently stable will continue cymbalta 120 mg daily and ativan 0.5 mg nightly as needed will monitor  7. Insomnia: will continue trazodone 200 mg nightly and will  monitor    Will check cbc and bmp next week   Time spent with patient 50 minutes       Ok Edwards NP [facility]  Contact [phone] Monday through Friday 8am- 5pm  After hours call [phone]

## 2013-07-15 ENCOUNTER — Non-Acute Institutional Stay (SKILLED_NURSING_FACILITY): Payer: Commercial Managed Care - HMO | Admitting: Internal Medicine

## 2013-07-15 ENCOUNTER — Encounter: Payer: Self-pay | Admitting: Internal Medicine

## 2013-07-15 DIAGNOSIS — M1612 Unilateral primary osteoarthritis, left hip: Secondary | ICD-10-CM

## 2013-07-15 DIAGNOSIS — F419 Anxiety disorder, unspecified: Secondary | ICD-10-CM

## 2013-07-15 DIAGNOSIS — G4700 Insomnia, unspecified: Secondary | ICD-10-CM

## 2013-07-15 DIAGNOSIS — K219 Gastro-esophageal reflux disease without esophagitis: Secondary | ICD-10-CM

## 2013-07-15 DIAGNOSIS — F411 Generalized anxiety disorder: Secondary | ICD-10-CM

## 2013-07-15 DIAGNOSIS — K5900 Constipation, unspecified: Secondary | ICD-10-CM

## 2013-07-15 DIAGNOSIS — M1610 Unilateral primary osteoarthritis, unspecified hip: Secondary | ICD-10-CM

## 2013-07-15 DIAGNOSIS — I10 Essential (primary) hypertension: Secondary | ICD-10-CM

## 2013-07-15 NOTE — Progress Notes (Signed)
Patient ID: Shelly Hickman, female   DOB: 06-28-1940, 73 y.o.   MRN: [id number]     [facility]   PCP: Rachell Cipro, MD  Code Status: full code  Allergies  Allergen Reactions  . Avelox [Moxifloxacin Hcl In Nacl] Other (See Comments)    Chest pain  . Cephalosporins Hives    Chief Complaint: new admission  HPI:  73 y/o female patient is here for STR after hospital admission for left hip arthroplasty for severe osteoarthritis. She complaints of discomfort in her left leg, especially post therapy and also has been constipated. She has been participating in therapy. Pending follow up with orthopedics.she would like to know about the pain med and muscle relaxant she is getting  Review of Systems:  Constitutional: Negative for fever, chills, weight loss, malaise/fatigue and diaphoresis.  HENT: Negative for congestion, hearing loss and sore throat.   Eyes: Negative for eye pain, blurred vision, double vision and discharge.  Respiratory: Negative for cough, sputum production, shortness of breath and wheezing.   Cardiovascular: Negative for chest pain, palpitations, orthopnea and leg swelling.  Gastrointestinal: Negative for heartburn, nausea, vomiting, abdominal pain. Has constipation.  Genitourinary: Negative for dysuria, urgency, frequency Musculoskeletal: Negative for back pain, falls Skin: Negative for itching and rash.  Neurological: Negative for dizziness, tingling, focal weakness and headaches.  Psychiatric/Behavioral: Negative for depression and memory loss. The patient is not nervous/anxious.     Past Medical History  Diagnosis Date  . Hypertension   . Hypercholesterolemia   . Depression   . Anxiety   . Anemia   . Pneumonia     11/2012- hosp. at [facility]  . H/O hiatal hernia   . Arthritis     "entire body"  . Incontinence of urine    Past Surgical History  Procedure Laterality Date  . Right knee arthroscopy    . Abdominal hysterectomy    . 2 synovial  cysts removed between l4-l5    . Tonsillectomy    . Back surgery      back fusion after 2 back surgeries   . Eye surgery      tube inserted for excessive tearing , tube is removed   . Total hip arthroplasty Left 07/09/2013    Procedure: TOTAL HIP ARTHROPLASTY;  Surgeon: Kerin Salen, MD;  Location: [place];  Service: Orthopedics;  Laterality: Left;   Social History:   reports that she quit smoking about 50 years ago. Her smoking use included Cigarettes. She smoked 0.00 packs per day. She has never used smokeless tobacco. She reports that she does not drink alcohol or use illicit drugs.  Family History  Problem Relation Age of Onset  . Hypertension Mother   . Alcoholism Father     Medications: Patient's Medications  New Prescriptions   No medications on file  Previous Medications   AMLODIPINE (NORVASC) 5 MG TABLET    Take 5 mg by mouth daily before breakfast.    ASPIRIN EC 325 MG TABLET    Take 1 tablet (325 mg total) by mouth 2 (two) times daily.   CALCIUM CARBONATE (TUMS EX) 750 MG CHEWABLE TABLET    Chew 2 tablets by mouth daily as needed for heartburn.   CHOLECALCIFEROL (VITAMIN D) 1000 UNITS TABLET    Take 5,000 Units by mouth daily.   DULOXETINE (CYMBALTA) 60 MG CAPSULE    Take 120 mg by mouth daily before breakfast.    FLUTICASONE (FLONASE) 50 MCG/ACT NASAL SPRAY    Place  2 sprays into the nose daily.   HYDROCODONE-ACETAMINOPHEN (NORCO) 7.5-325 MG PER TABLET    Take 1 tablet by mouth every 3 (three) hours as needed for moderate pain.   LORAZEPAM (ATIVAN) 0.5 MG TABLET    Take 0.5 mg by mouth at bedtime as needed for anxiety.    LOSARTAN-HYDROCHLOROTHIAZIDE (HYZAAR) 100-25 MG PER TABLET    Take 1 tablet by mouth daily before breakfast.    MINERAL OIL-HYDROPHILIC PETROLATUM (AQUAPHOR) OINTMENT    Apply 1 application topically as needed for dry skin.   MONTELUKAST (SINGULAIR) 10 MG TABLET    Take 10 mg by mouth daily before breakfast.    OMEGA-3 ACID ETHYL ESTERS (LOVAZA) 1 G  CAPSULE    Take 1 g by mouth 2 (two) times daily.    POLYETHYLENE GLYCOL POWDER (GLYCOLAX/MIRALAX) POWDER    Take 17 g by mouth daily.   SIMVASTATIN (ZOCOR) 10 MG TABLET    Take 10 mg by mouth at bedtime.   TIZANIDINE (ZANAFLEX) 2 MG CAPSULE    Take 1 capsule (2 mg total) by mouth every 6 (six) hours.   TRAZODONE (DESYREL) 100 MG TABLET    Take 200 mg by mouth at bedtime.   Modified Medications   No medications on file  Discontinued Medications   No medications on file     Physical Exam: Filed Vitals:   07/15/13 1642  BP: 135/72  Pulse: 84  Temp: 96.4 F (35.8 C)  Resp: 18  SpO2: 97%    General- elderly female in no acute distress Head- atraumatic, normocephalic Eyes- PERRLA, EOMI, no pallor, no icterus, no discharge Neck- no lymphadenopathy, no thyromegaly Cardiovascular- normal s1,s2, no murmurs/ rubs/ gallops Respiratory- bilateral clear to auscultation, no wheeze, no rhonchi, no crackles, no use of accessory muscles Abdomen- bowel sounds present, soft, non tender Musculoskeletal- able to move all 4 extremities, left hip ROM limited, trace edema in left leg  Neurological- no focal deficit Skin- warm and dry, dressing in place in left hip, few ecchymosis noted Psychiatry- alert and oriented to person, place and time, normal mood and affect    Labs reviewed: Basic Metabolic Panel:  Recent Labs  07/11/13 1130 07/12/13 1906 07/13/13 0905  NA 131* 131* 135*  K 3.9 3.9 4.1  CL 94* 94* 96  CO2 25 26 26   GLUCOSE 117* 146* 146*  BUN 23 20 17   CREATININE 1.22* 1.10 0.82  CALCIUM 8.4 8.7 9.1   Liver Function Tests:  Recent Labs  12/10/12 1500  AST 26  ALT 17  ALKPHOS 89  BILITOT 0.3  PROT 6.7  ALBUMIN 2.4*   No results found for this basename: LIPASE, AMYLASE,  in the last 8760 hours No results found for this basename: AMMONIA,  in the last 8760 hours CBC:  Recent Labs  12/13/12 0541 07/01/13 1306  07/11/13 0544 07/12/13 0415 07/13/13 0905  WBC  15.2* 10.0  < > 13.9* 12.8* 14.7*  NEUTROABS  --  5.9  --   --   --   --   HGB 10.8* 12.9  < > 9.0* 8.4* 9.6*  HCT 32.4* 39.5  < > 26.8* 24.9* 29.5*  MCV 85.9 88.6  < > 89.6 88.9 89.7  PLT 536* 399  < > 255 256 377  < > = values in this interval not displayed. Cardiac Enzymes:  Recent Labs  12/10/12 1500 12/10/12 1919 12/11/12 0051  TROPONINI <0.30 <0.30 <0.30   07/15/13 Wbc 10.6, hb 7.5, hct 24.5, plt 409, na  137, k 3.7, bun 14, cr 0.7, ca 8.1, mcv 92.1   Radiological Exams: 07-01-13: chest x-ray: No acute cardiopulmonary disease.    07-09-13: pelvic x-ray: Left total hip prosthesis appears well-seated. No acute fracture or dislocation.   Assessment/Plan  Left hip OA S/p left hip arthroplasty. With her pain not under control, her medication was adjusted yesterday. Explained this to patient and provided information on the medication she was getting at present. Continue vicodin 7.5/325 q3h prn and zanaflex 2 mg q6h standing for muscle spasm. Will d/c aspirin for now with her low hb and concern for bleed. Started on lovenox for dvt prophylaxis  Constipation Continue miralax 17 g daily and add senna s 1 tab bid. monitor for bowel movement  Anemia With low hemoglobin, i have concerns for bleed. Will hold her aspirin for now and switch her to sq lovenox 40 mg daily for dvt prophylaxis until seen by orthopedics. Recheck cbc 07/19/13  Hypertension continue norvasc 5 mg daily and hyzaar 100/25 mg daily with statin  Anxiety On cymbalta 120 mg daily and ativan 0.5 mg nightly as needed, monitor clinically  Insomnia continue trazodone 200 mg daily   Family/ staff Communication: reviewed care plan with patient and nursing supervisor  Goals of care: short term rehabilitation   Labs/tests ordered: cbc    Blanchie Serve, MD  [facility] [phone] (Monday-Friday 8 am - 5 pm) [phone] (afterhours)

## 2013-07-26 ENCOUNTER — Non-Acute Institutional Stay (SKILLED_NURSING_FACILITY): Payer: Commercial Managed Care - HMO | Admitting: Adult Health

## 2013-07-26 DIAGNOSIS — M1610 Unilateral primary osteoarthritis, unspecified hip: Secondary | ICD-10-CM

## 2013-07-26 DIAGNOSIS — I10 Essential (primary) hypertension: Secondary | ICD-10-CM

## 2013-07-26 DIAGNOSIS — Z96642 Presence of left artificial hip joint: Secondary | ICD-10-CM

## 2013-07-26 DIAGNOSIS — M1612 Unilateral primary osteoarthritis, left hip: Secondary | ICD-10-CM

## 2013-07-26 DIAGNOSIS — Z96649 Presence of unspecified artificial hip joint: Secondary | ICD-10-CM

## 2013-07-26 DIAGNOSIS — E785 Hyperlipidemia, unspecified: Secondary | ICD-10-CM

## 2013-08-12 ENCOUNTER — Encounter: Payer: Self-pay | Admitting: Adult Health

## 2013-08-12 NOTE — Progress Notes (Signed)
Patient ID: Shelly Hickman, female   DOB: [birth year], 73 y.o.   MRN: [id number]     [facility]  Allergies  Allergen Reactions  . Avelox [Moxifloxacin Hcl In Nacl] Other (See Comments)    Chest pain  . Cephalosporins Hives    Chief Complaint  Patient presents with  . Discharge Note    HPI:  She is being discharged to home with home health for pt/ot/rn/aid. She will need front wheel walker in or for her to maintain her current level of independence with her adl's. She will need her prescriptions to be written.  She had been hospitalized for a left hip replacement and was admitted to this facility for short term rehab.    Past Medical History  Diagnosis Date  . Hypertension   . Hypercholesterolemia   . Depression   . Anxiety   . Anemia   . Pneumonia     11/2012- hosp. at [facility]  . H/O hiatal hernia   . Arthritis     "entire body"  . Incontinence of urine     Past Surgical History  Procedure Laterality Date  . Right knee arthroscopy    . Abdominal hysterectomy    . 2 synovial cysts removed between l4-l5    . Tonsillectomy    . Back surgery      back fusion after 2 back surgeries   . Eye surgery      tube inserted for excessive tearing , tube is removed   . Total hip arthroplasty Left 07/09/2013    Procedure: TOTAL HIP ARTHROPLASTY;  Surgeon: Kerin Salen, MD;  Location: [place];  Service: Orthopedics;  Laterality: Left;    VITAL SIGNS BP 118/68  Pulse 71  Ht 5\' 7"  (1.702 m)  Wt 203 lb 9.6 oz (92.352 kg)  BMI 31.88 kg/m2   Patient's Medications  New Prescriptions   No medications on file  Previous Medications   AMLODIPINE (NORVASC) 5 MG TABLET    Take 5 mg by mouth daily before breakfast.    ASPIRIN EC 325 MG TABLET    Take 1 tablet (325 mg total) by mouth 2 (two) times daily.   CALCIUM CARBONATE (TUMS EX) 750 MG CHEWABLE TABLET    Chew 2 tablets by mouth daily as needed for heartburn.   CHOLECALCIFEROL (VITAMIN D) 1000 UNITS TABLET    Take 5,000 Units  by mouth daily.   DULOXETINE (CYMBALTA) 60 MG CAPSULE    Take 120 mg by mouth daily before breakfast.    FLUTICASONE (FLONASE) 50 MCG/ACT NASAL SPRAY    Place 2 sprays into the nose daily.   HYDROCODONE-ACETAMINOPHEN (NORCO) 7.5-325 MG PER TABLET    Take 1 tablet by mouth every 3 (three) hours as needed for moderate pain.   LORAZEPAM (ATIVAN) 0.5 MG TABLET    Take 0.5 mg by mouth at bedtime as needed for anxiety.    LOSARTAN-HYDROCHLOROTHIAZIDE (HYZAAR) 100-25 MG PER TABLET    Take 1 tablet by mouth daily before breakfast.    MINERAL OIL-HYDROPHILIC PETROLATUM (AQUAPHOR) OINTMENT    Apply 1 application topically as needed for dry skin.   MONTELUKAST (SINGULAIR) 10 MG TABLET    Take 10 mg by mouth daily before breakfast.    OMEGA-3 ACID ETHYL ESTERS (LOVAZA) 1 G CAPSULE    Take 1 g by mouth 2 (two) times daily.    POLYETHYLENE GLYCOL POWDER (GLYCOLAX/MIRALAX) POWDER    Take 17 g by mouth daily.   SIMVASTATIN (ZOCOR) 10 MG  TABLET    Take 10 mg by mouth at bedtime.   TIZANIDINE (ZANAFLEX) 2 MG CAPSULE    Take 1 capsule (2 mg total) by mouth every 6 (six) hours.   TRAZODONE (DESYREL) 100 MG TABLET    Take 200 mg by mouth at bedtime.   Modified Medications   No medications on file  Discontinued Medications   No medications on file    SIGNIFICANT DIAGNOSTIC EXAMS   07-01-13: chest x-ray: No acute cardiopulmonary disease.    07-09-13: pelvic x-ray: Left total hip prosthesis appears well-seated. No acute fracture or dislocation.  LABS REVIEWED:   07-01-13: wbc 10.0; hgb 12.9; hct 39.5; mcv 88.6; plt 399; glucose 100; bun 18; creat 0.86; k+3.8;  na++ 141 07-13-13: wbc 14.7; hgb 9.6; hct 29.5; mcv 89.7; plt 377; glucose 146; bun 17; creat 0.82; k+4.1; na++135 07-20-13: wbc 10.4; hgb 8.2; hct 27.7; mcv 94.5; plt 518 07-21-13: wbc 11.2; hgb 8.0; hct 27.1; mcv 92.8; plt 524      Review of Systems  Constitutional: Negative for malaise/fatigue.  Respiratory: Negative for cough and shortness of  breath.   Cardiovascular: Negative for chest pain, palpitations and leg swelling.  Gastrointestinal: negative for constipation. Negative for heartburn and abdominal pain.  Musculoskeletal: no joint pain . Negative for myalgias.  Skin: Negative.   Neurological: Negative for headaches.  Psychiatric/Behavioral: Negative for depression. The patient is not nervous/anxious.      Physical Exam  Constitutional: She is oriented to person, place, and time. She appears well-developed and well-nourished. No distress.  obese  Neck: Neck supple. No JVD present.  Cardiovascular: Normal rate, regular rhythm and intact distal pulses.   Respiratory: Effort normal and breath sounds normal. No respiratory distress. She has no wheezes.  GI: Soft. Bowel sounds are normal. She exhibits no distension. There is no tenderness.  Musculoskeletal: She exhibits no edema.  Is able to move all extremities; is status post left hip replacement   Neurological: She is alert and oriented to person, place, and time.  Skin: Skin is warm and dry. She is not diaphoretic.  Left hip incision line without signs of infection present; has significant bruising present.   Psychiatric: She has a normal mood and affect.      ASSESSMENT/ PLAN:  Will discharge to home with home health for pt/ot/rn/aid.  She will need a front wheel walker. Her prescriptions have been written    Time spent with patient 40 minutes      Ok Edwards NP [facility]  Contact [phone] Monday through Friday 8am- 5pm  After hours call [phone]

## 2013-08-13 NOTE — OR Nursing (Signed)
Addendum to scope page and wound classification

## 2013-09-05 DIAGNOSIS — R32 Unspecified urinary incontinence: Secondary | ICD-10-CM | POA: Insufficient documentation

## 2013-12-03 ENCOUNTER — Other Ambulatory Visit: Payer: Self-pay

## 2014-01-21 LAB — HM COLONOSCOPY

## 2014-08-15 ENCOUNTER — Other Ambulatory Visit: Payer: Self-pay

## 2014-12-06 LAB — HM MAMMOGRAPHY

## 2014-12-21 ENCOUNTER — Other Ambulatory Visit: Payer: Self-pay | Admitting: Family Medicine

## 2014-12-21 ENCOUNTER — Ambulatory Visit
Admission: RE | Admit: 2014-12-21 | Discharge: 2014-12-21 | Disposition: A | Discharge status: Home / Self Care | Payer: PPO | Source: Ambulatory Visit | Attending: Family Medicine | Admitting: Family Medicine

## 2014-12-21 DIAGNOSIS — T1490XA Injury, unspecified, initial encounter: Secondary | ICD-10-CM

## 2014-12-21 LAB — LIPID PANEL
Cholesterol: 189 mg/dL (ref 0–200)
HDL: 45 mg/dL (ref 35–70)
LDL Cholesterol: 117 mg/dL
Triglycerides: 134 mg/dL (ref 40–160)

## 2015-02-24 DIAGNOSIS — H109 Unspecified conjunctivitis: Secondary | ICD-10-CM | POA: Diagnosis not present

## 2015-02-24 DIAGNOSIS — J189 Pneumonia, unspecified organism: Secondary | ICD-10-CM | POA: Diagnosis not present

## 2015-02-24 DIAGNOSIS — R509 Fever, unspecified: Secondary | ICD-10-CM | POA: Diagnosis not present

## 2015-03-02 DIAGNOSIS — J189 Pneumonia, unspecified organism: Secondary | ICD-10-CM | POA: Diagnosis not present

## 2015-03-02 DIAGNOSIS — H6693 Otitis media, unspecified, bilateral: Secondary | ICD-10-CM | POA: Diagnosis not present

## 2015-03-06 DIAGNOSIS — B3782 Candidal enteritis: Secondary | ICD-10-CM | POA: Diagnosis not present

## 2015-03-06 DIAGNOSIS — H6693 Otitis media, unspecified, bilateral: Secondary | ICD-10-CM | POA: Diagnosis not present

## 2015-03-06 DIAGNOSIS — R197 Diarrhea, unspecified: Secondary | ICD-10-CM | POA: Diagnosis not present

## 2015-03-23 DIAGNOSIS — J189 Pneumonia, unspecified organism: Secondary | ICD-10-CM | POA: Diagnosis not present

## 2015-03-23 DIAGNOSIS — H6693 Otitis media, unspecified, bilateral: Secondary | ICD-10-CM | POA: Diagnosis not present

## 2015-03-23 DIAGNOSIS — E119 Type 2 diabetes mellitus without complications: Secondary | ICD-10-CM | POA: Diagnosis not present

## 2015-03-23 LAB — HEMOGLOBIN A1C: Hemoglobin A1C: 5.8

## 2015-04-03 DIAGNOSIS — N898 Other specified noninflammatory disorders of vagina: Secondary | ICD-10-CM | POA: Diagnosis not present

## 2015-04-03 DIAGNOSIS — H93293 Other abnormal auditory perceptions, bilateral: Secondary | ICD-10-CM | POA: Diagnosis not present

## 2015-04-03 DIAGNOSIS — D485 Neoplasm of uncertain behavior of skin: Secondary | ICD-10-CM | POA: Diagnosis not present

## 2015-04-03 DIAGNOSIS — H938X3 Other specified disorders of ear, bilateral: Secondary | ICD-10-CM | POA: Diagnosis not present

## 2015-04-03 DIAGNOSIS — L219 Seborrheic dermatitis, unspecified: Secondary | ICD-10-CM | POA: Diagnosis not present

## 2015-04-03 DIAGNOSIS — H6693 Otitis media, unspecified, bilateral: Secondary | ICD-10-CM | POA: Diagnosis not present

## 2015-04-03 DIAGNOSIS — Z23 Encounter for immunization: Secondary | ICD-10-CM | POA: Diagnosis not present

## 2015-04-03 DIAGNOSIS — L820 Inflamed seborrheic keratosis: Secondary | ICD-10-CM | POA: Diagnosis not present

## 2015-04-03 DIAGNOSIS — L821 Other seborrheic keratosis: Secondary | ICD-10-CM | POA: Diagnosis not present

## 2015-04-05 DIAGNOSIS — H6993 Unspecified Eustachian tube disorder, bilateral: Secondary | ICD-10-CM | POA: Diagnosis not present

## 2015-04-05 DIAGNOSIS — H903 Sensorineural hearing loss, bilateral: Secondary | ICD-10-CM | POA: Diagnosis not present

## 2015-05-29 DIAGNOSIS — M170 Bilateral primary osteoarthritis of knee: Secondary | ICD-10-CM | POA: Diagnosis not present

## 2015-06-05 DIAGNOSIS — M170 Bilateral primary osteoarthritis of knee: Secondary | ICD-10-CM | POA: Diagnosis not present

## 2015-06-12 DIAGNOSIS — L820 Inflamed seborrheic keratosis: Secondary | ICD-10-CM | POA: Diagnosis not present

## 2015-06-14 DIAGNOSIS — M170 Bilateral primary osteoarthritis of knee: Secondary | ICD-10-CM | POA: Diagnosis not present

## 2015-06-21 DIAGNOSIS — M1711 Unilateral primary osteoarthritis, right knee: Secondary | ICD-10-CM | POA: Diagnosis not present

## 2015-06-21 DIAGNOSIS — G5762 Lesion of plantar nerve, left lower limb: Secondary | ICD-10-CM | POA: Diagnosis not present

## 2015-06-28 DIAGNOSIS — M1711 Unilateral primary osteoarthritis, right knee: Secondary | ICD-10-CM | POA: Diagnosis not present

## 2015-08-30 ENCOUNTER — Encounter: Payer: Self-pay | Admitting: Family Medicine

## 2015-08-30 ENCOUNTER — Ambulatory Visit (INDEPENDENT_AMBULATORY_CARE_PROVIDER_SITE_OTHER): Payer: PPO | Admitting: Family Medicine

## 2015-08-30 VITALS — BP 136/82 | HR 92 | Ht 65.5 in | Wt 223.7 lb

## 2015-08-30 DIAGNOSIS — G4700 Insomnia, unspecified: Secondary | ICD-10-CM | POA: Diagnosis not present

## 2015-08-30 DIAGNOSIS — E559 Vitamin D deficiency, unspecified: Secondary | ICD-10-CM

## 2015-08-30 DIAGNOSIS — F32A Depression, unspecified: Secondary | ICD-10-CM

## 2015-08-30 DIAGNOSIS — F329 Major depressive disorder, single episode, unspecified: Secondary | ICD-10-CM

## 2015-08-30 DIAGNOSIS — F419 Anxiety disorder, unspecified: Secondary | ICD-10-CM

## 2015-08-30 DIAGNOSIS — I10 Essential (primary) hypertension: Secondary | ICD-10-CM

## 2015-08-30 DIAGNOSIS — E785 Hyperlipidemia, unspecified: Secondary | ICD-10-CM

## 2015-08-30 DIAGNOSIS — Z13820 Encounter for screening for osteoporosis: Secondary | ICD-10-CM | POA: Diagnosis not present

## 2015-08-30 DIAGNOSIS — R413 Other amnesia: Secondary | ICD-10-CM

## 2015-08-30 DIAGNOSIS — E7800 Pure hypercholesterolemia, unspecified: Secondary | ICD-10-CM | POA: Insufficient documentation

## 2015-08-30 DIAGNOSIS — E782 Mixed hyperlipidemia: Secondary | ICD-10-CM | POA: Insufficient documentation

## 2015-08-30 MED ORDER — SIMVASTATIN 10 MG PO TABS: 10.0000 mg | ORAL_TABLET | Freq: Every day | ORAL | Status: DC

## 2015-08-30 MED ORDER — DULOXETINE HCL 60 MG PO CPEP: 120.0000 mg | ORAL_CAPSULE | Freq: Every day | ORAL | Status: DC

## 2015-08-30 MED ORDER — TRAZODONE HCL 100 MG PO TABS: 100.0000 mg | ORAL_TABLET | Freq: Every day | ORAL | Status: DC

## 2015-08-30 NOTE — Patient Instructions (Addendum)
[employer] 3-5 days per week with 1 hour total of cardio       Stress and Stress Management Stress is a normal reaction to life events. It is what you feel when life demands more than you are used to or more than you can handle. Some stress can be useful. For example, the stress reaction can help you catch the last bus of the day, study for a test, or meet a deadline at work. But stress that occurs too often or for too long can cause problems. It can affect your emotional health and interfere with relationships and normal daily activities. Too much stress can weaken your immune system and increase your risk for physical illness. If you already have a medical problem, stress can make it worse. CAUSES  All sorts of life events may cause stress. An event that causes stress for one person may not be stressful for another person. Major life events commonly cause stress. These may be positive or negative. Examples include losing your job, moving into a new home, getting married, having a baby, or losing a loved one. Less obvious life events may also cause stress, especially if they occur day after day or in combination. Examples include working long hours, driving in traffic, caring for children, being in debt, or being in a difficult relationship. SIGNS AND SYMPTOMS Stress may cause emotional symptoms including, the following:  Anxiety. This is feeling worried, afraid, on edge, overwhelmed, or out of control.  Anger. This is feeling irritated or impatient.  Depression. This is feeling sad, down, helpless, or guilty.  Difficulty focusing, remembering, or making decisions. Stress may cause physical symptoms, including the following:   Aches and pains. These may affect your head, neck, back, stomach, or other areas of your body.  Tight muscles or clenched jaw.  Low energy or trouble sleeping. Stress may cause unhealthy behaviors, including the following:   Eating to feel better (overeating) or  skipping meals.  Sleeping too little, too much, or both.  Working too much or putting off tasks (procrastination).  Smoking, drinking alcohol, or using drugs to feel better. DIAGNOSIS  Stress is diagnosed through an assessment by your health care provider. Your health care provider will ask questions about your symptoms and any stressful life events.Your health care provider will also ask about your medical history and may order blood tests or other tests. Certain medical conditions and medicine can cause physical symptoms similar to stress. Mental illness can cause emotional symptoms and unhealthy behaviors similar to stress. Your health care provider may refer you to a mental health professional for further evaluation.  TREATMENT  Stress management is the recommended treatment for stress.The goals of stress management are reducing stressful life events and coping with stress in healthy ways.  Techniques for reducing stressful life events include the following:  Stress identification. Self-monitor for stress and identify what causes stress for you. These skills may help you to avoid some stressful events.  Time management. Set your priorities, keep a calendar of events, and learn to say "no." These tools can help you avoid making too many commitments. Techniques for coping with stress include the following:  Rethinking the problem. Try to think realistically about stressful events rather than ignoring them or overreacting. Try to find the positives in a stressful situation rather than focusing on the negatives.  Exercise. Physical exercise can release both physical and emotional tension. The key is to find a form of exercise you enjoy and do it regularly.  Relaxation techniques. These relax the body and mind. Examples include yoga, meditation, tai chi, biofeedback, deep breathing, progressive muscle relaxation, listening to music, being out in nature, journaling, and other hobbies. Again,  the key is to find one or more that you enjoy and can do regularly.  Healthy lifestyle. Eat a balanced diet, get plenty of sleep, and do not smoke. Avoid using alcohol or drugs to relax.  Strong support network. Spend time with family, friends, or other people you enjoy being around.Express your feelings and talk things over with someone you trust. Counseling or talktherapy with a mental health professional may be helpful if you are having difficulty managing stress on your own. Medicine is typically not recommended for the treatment of stress.Talk to your health care provider if you think you need medicine for symptoms of stress. HOME CARE INSTRUCTIONS  Keep all follow-up visits as directed by your health care provider.  Take all medicines as directed by your health care provider. SEEK MEDICAL CARE IF:  Your symptoms get worse or you start having new symptoms.  You feel overwhelmed by your problems and can no longer manage them on your own. SEEK IMMEDIATE MEDICAL CARE IF:  You feel like hurting yourself or someone else.   This information is not intended to replace advice given to you by your health care provider. Make sure you discuss any questions you have with your health care provider.   Document Released: 07/31/2000 Document Revised: 02/25/2014 Document Reviewed: 09/29/2012 Elsevier Interactive Patient Education 2016 [employer].   Adjustment Disorder Adjustment disorder is an unusually severe reaction to a stressful life event, such as the loss of a job or physical illness. The event may be any stressful event other than the loss of a loved one. Adjustment disorder may affect your feelings, your thinking, how you act, or a combination of these. It may interfere with personal relationships or with the way you are at work, school, or home. People with this disorder are at risk for suicide and substance abuse. They may develop a more serious mental disorder, such as major  depressive disorder or post-traumatic stress disorder. SIGNS AND SYMPTOMS  Symptoms may include:  Sadness, depressed mood, or crying spells.  Loss of enjoyment.  Change in appetite or weight.  Sense of loss or hopelessness.  Thoughts of suicide.  Anxiety, worry, or nervousness.  Trouble sleeping.  Avoiding family and friends.  Poor school performance.  Fighting or vandalism.  Reckless driving.  Skipping school.  Poor work Systems analyst.  Ignoring bills. Symptoms of adjustment disorder start within 3 months of the stressful life event. They do not last more than 6 months after the event has ended. DIAGNOSIS  To make a diagnosis, your health care provider will ask about what has happened in your life and how it has affected you. He or she may also ask about your medical history and use of medicines, alcohol, and other substances. Your health care provider may do a physical exam and order lab tests or other studies. You may be referred to a mental health specialist for evaluation. TREATMENT  Treatment options include:  Counseling or talk therapy. Talk therapy is usually provided by mental health specialists.  Medicine. Certain medicines may help with depression, anxiety, and sleep.  Support groups. Support groups offer emotional support, advice, and guidance. They are made up of people who have had similar experiences. HOME CARE INSTRUCTIONS  Keep all follow-up visits as directed by your health care provider. This is important.  Take medicines only as directed by your health care provider. SEEK MEDICAL CARE IF:  Your symptoms get worse.  SEEK IMMEDIATE MEDICAL CARE IF: You have serious thoughts about hurting yourself or someone else. MAKE SURE YOU:  Understand these instructions.  Will watch your condition.  Will get help right away if you are not doing well or get worse.   This information is not intended to replace advice given to you by your health care  provider. Make sure you discuss any questions you have with your health care provider.   Document Released: 10/09/2005 Document Revised: 02/25/2014 Document Reviewed: 06/29/2013 Elsevier Interactive Patient Education [employer].

## 2015-08-31 ENCOUNTER — Other Ambulatory Visit (INDEPENDENT_AMBULATORY_CARE_PROVIDER_SITE_OTHER): Payer: PPO

## 2015-08-31 DIAGNOSIS — E785 Hyperlipidemia, unspecified: Secondary | ICD-10-CM | POA: Diagnosis not present

## 2015-08-31 DIAGNOSIS — E559 Vitamin D deficiency, unspecified: Secondary | ICD-10-CM | POA: Diagnosis not present

## 2015-08-31 DIAGNOSIS — F32A Depression, unspecified: Secondary | ICD-10-CM

## 2015-08-31 DIAGNOSIS — G4700 Insomnia, unspecified: Secondary | ICD-10-CM

## 2015-08-31 DIAGNOSIS — R413 Other amnesia: Secondary | ICD-10-CM

## 2015-08-31 DIAGNOSIS — Z13820 Encounter for screening for osteoporosis: Secondary | ICD-10-CM

## 2015-08-31 DIAGNOSIS — I10 Essential (primary) hypertension: Secondary | ICD-10-CM

## 2015-08-31 DIAGNOSIS — F329 Major depressive disorder, single episode, unspecified: Secondary | ICD-10-CM

## 2015-08-31 DIAGNOSIS — F419 Anxiety disorder, unspecified: Secondary | ICD-10-CM

## 2015-08-31 LAB — CBC WITH DIFFERENTIAL/PLATELET
Basophils Absolute: 0 cells/uL (ref 0–200)
Basophils Relative: 0 %
Eosinophils Absolute: 588 cells/uL — ABNORMAL HIGH (ref 15–500)
Eosinophils Relative: 6 %
HCT: 40.4 % (ref 35.0–45.0)
Hemoglobin: 13.4 g/dL (ref 11.7–15.5)
Lymphocytes Relative: 23 %
Lymphs Abs: 2254 cells/uL (ref 850–3900)
MCH: 28.9 pg (ref 27.0–33.0)
MCHC: 33.2 g/dL (ref 32.0–36.0)
MCV: 87.3 fL (ref 80.0–100.0)
MPV: 10.3 fL (ref 7.5–12.5)
Monocytes Absolute: 882 cells/uL (ref 200–950)
Monocytes Relative: 9 %
Neutro Abs: 6076 cells/uL (ref 1500–7800)
Neutrophils Relative %: 62 %
Platelets: 411 10*3/uL — ABNORMAL HIGH (ref 140–400)
RBC: 4.63 MIL/uL (ref 3.80–5.10)
RDW: 14.6 % (ref 11.0–15.0)
WBC: 9.8 10*3/uL (ref 3.8–10.8)

## 2015-09-01 LAB — LIPID PANEL
Cholesterol: 294 mg/dL — ABNORMAL HIGH (ref 125–200)
HDL: 35 mg/dL — ABNORMAL LOW (ref >=46)
LDL Cholesterol: 224 mg/dL — ABNORMAL HIGH (ref <130)
Total CHOL/HDL Ratio: 8.4 Ratio — ABNORMAL HIGH (ref <=5.0)
Triglycerides: 175 mg/dL — ABNORMAL HIGH (ref <150)
VLDL: 35 mg/dL — ABNORMAL HIGH (ref <30)

## 2015-09-01 LAB — COMPLETE METABOLIC PANEL WITH GFR
ALT: 27 U/L (ref 6–29)
AST: 24 U/L (ref 10–35)
Albumin: 3.9 g/dL (ref 3.6–5.1)
Alkaline Phosphatase: 77 U/L (ref 33–130)
BUN: 17 mg/dL (ref 7–25)
CO2: 29 mmol/L (ref 20–31)
Calcium: 9 mg/dL (ref 8.6–10.4)
Chloride: 98 mmol/L (ref 98–110)
Creat: 0.91 mg/dL (ref 0.60–0.93)
GFR, Est African American: 72 mL/min (ref >=60)
GFR, Est Non African American: 63 mL/min (ref >=60)
Glucose, Bld: 120 mg/dL — ABNORMAL HIGH (ref 65–99)
Potassium: 3.9 mmol/L (ref 3.5–5.3)
Sodium: 137 mmol/L (ref 135–146)
Total Bilirubin: 0.6 mg/dL (ref 0.2–1.2)
Total Protein: 6.4 g/dL (ref 6.1–8.1)

## 2015-09-01 LAB — HEMOGLOBIN A1C
Hgb A1c MFr Bld: 6.4 % — ABNORMAL HIGH (ref <5.7)
Mean Plasma Glucose: 137 mg/dL

## 2015-09-01 LAB — TSH: TSH: 2.53 mIU/L

## 2015-09-01 LAB — VITAMIN D 25 HYDROXY (VIT D DEFICIENCY, FRACTURES): Vit D, 25-Hydroxy: 29 ng/mL — ABNORMAL LOW (ref 30–100)

## 2015-09-01 LAB — VITAMIN B12: Vitamin B-12: 515 pg/mL (ref 200–1100)

## 2015-09-06 ENCOUNTER — Ambulatory Visit (INDEPENDENT_AMBULATORY_CARE_PROVIDER_SITE_OTHER): Payer: PPO | Admitting: Family Medicine

## 2015-09-06 ENCOUNTER — Encounter: Payer: Self-pay | Admitting: Family Medicine

## 2015-09-06 VITALS — BP 120/82 | HR 77 | Ht 65.5 in | Wt 227.0 lb

## 2015-09-06 DIAGNOSIS — R7303 Prediabetes: Secondary | ICD-10-CM

## 2015-09-06 DIAGNOSIS — E785 Hyperlipidemia, unspecified: Secondary | ICD-10-CM | POA: Diagnosis not present

## 2015-09-06 DIAGNOSIS — E559 Vitamin D deficiency, unspecified: Secondary | ICD-10-CM

## 2015-09-06 DIAGNOSIS — I10 Essential (primary) hypertension: Secondary | ICD-10-CM

## 2015-09-06 DIAGNOSIS — E781 Pure hyperglyceridemia: Secondary | ICD-10-CM

## 2015-09-06 DIAGNOSIS — F32A Depression, unspecified: Secondary | ICD-10-CM

## 2015-09-06 DIAGNOSIS — F329 Major depressive disorder, single episode, unspecified: Secondary | ICD-10-CM

## 2015-09-06 DIAGNOSIS — N3946 Mixed incontinence: Secondary | ICD-10-CM

## 2015-09-06 DIAGNOSIS — E786 Lipoprotein deficiency: Secondary | ICD-10-CM | POA: Insufficient documentation

## 2015-09-06 DIAGNOSIS — K5909 Other constipation: Secondary | ICD-10-CM

## 2015-09-06 MED ORDER — SIMVASTATIN 40 MG PO TABS: 40.0000 mg | ORAL_TABLET | Freq: Every day | ORAL | Status: DC

## 2015-09-06 MED ORDER — NIACIN ER (ANTIHYPERLIPIDEMIC) 1000 MG PO TBCR: EXTENDED_RELEASE_TABLET | ORAL | Status: DC

## 2015-09-06 NOTE — Assessment & Plan Note (Signed)
This is well controlled. Continue current medicines.

## 2015-09-06 NOTE — Assessment & Plan Note (Signed)
Routine counseling done. Move more.

## 2015-09-06 NOTE — Assessment & Plan Note (Signed)
>>  ASSESSMENT AND PLAN FOR CONSTIPATION WRITTEN ON 09/06/2015 12:18 PM BY OPALSKI, DEBORAH, DO  Discussed the patient adequate water intake. Very important especially with these medicines we're putting her on

## 2015-09-06 NOTE — Progress Notes (Signed)
Subjective:    Chief Complaint  Patient presents with  . Results    HPI: Shelly Hickman is a 75 y.o. female who presents to [facility] at [facility] today   Here to review labs.  Reviewe with her in person, all questions answered.    Mood much better Now that she restarted the Cymbalta.   Trazodone working well for her bedtime.  Sleeping better.  Patient was told approximately 6-9 months ago that she was prediabetic- this was the first time she was told this.. But she admits that she has not been eating real well lately.  She has been under more stress with her husband as well as familial stress with her son-in-law having esophageal cancer etc. She's been doing more stress eating than usual.  Husband has diabetes so she knows all about it and how to check her blood sugars etc.      Past Medical History  Diagnosis Date  . Hypercholesterolemia   . Depression   . Anxiety   . Anemia   . Pneumonia     11/2012- hosp[facility] at [facility]  . H/O hiatal hernia   . Arthritis     "entire body"  . Incontinence of urine      Past Surgical History  Procedure Laterality Date  . Right knee arthroscopy    . Abdominal hysterectomy    . 2 synovial cysts removed between l4-l5    . Tonsillectomy    . Back surgery      back fusion after 2 back surgeries   . Eye surgery      tube inserted for excessive tearing , tube is removed   . 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 Hypertension Mother   . Diabetes Mother   . Stroke Mother   . Alcoholism Father   . Alcohol abuse Father   . Depression Sister      History  Drug Use No  ,  History  Alcohol Use No  ,  History  Smoking status  . Former Smoker  . Types: Cigarettes  . Quit date: 02/19/1995  Smokeless tobacco  . Never Used  ,    History  Sexual Activity  . Sexual Activity: Not Currently      Current Outpatient Prescriptions on File Prior to Visit  Medication Sig Dispense Refill  . amLODipine (NORVASC) 5 MG tablet Take 5 mg by mouth daily before breakfast.     . aspirin EC 325 MG tablet Take 1 tablet (325 mg total) by mouth 2 (two) times daily. 30 tablet 0  . calcium carbonate (TUMS EX) 750 MG chewable tablet Chew 2 tablets by mouth daily as needed for heartburn.    . cholecalciferol (VITAMIN D) 1000 UNITS tablet Take 5,000 Units by mouth daily.    . DULoxetine (CYMBALTA) 60 MG capsule Take 2 capsules (120 mg total) by mouth daily before breakfast. 90 capsule 1  . fluticasone (FLONASE) 50 MCG/ACT nasal spray Place 2 sprays into the nose daily.    Marland Kitchen losartan-hydrochlorothiazide (HYZAAR) 100-25 MG per tablet Take 1 tablet by mouth daily before breakfast.     . mineral oil-hydrophilic petrolatum (AQUAPHOR) ointment Apply 1 application topically as needed for dry skin.    . montelukast (SINGULAIR)  10 MG tablet Take 10 mg by mouth daily before breakfast.     . omega-3 acid ethyl esters (LOVAZA) 1 G capsule Take 1 g by mouth 2 (two) times daily. Reported on 08/30/2015    . traZODone (DESYREL) 100 MG tablet Take 1 tablet (100 mg total) by mouth at bedtime. Reported on 08/30/2015 90 tablet 0   No current facility-administered medications on file prior to visit.    Allergies  Allergen Reactions  . Avelox [Moxifloxacin Hcl In Nacl] Other (See Comments)    Chest pain  . Cephalosporins Hives      Review of Systems:  ( Completed via adult medical history intake form today ) General:  Denies fever, chills, appetite changes, unexplained weight loss.  Respiratory: Denies SOB, DOE, cough, wheezing.  Cardiovascular: Denies chest pain, palpitations.  Gastrointestinal: Denies nausea, vomiting, diarrhea, abdominal pain.  Genitourinary: Denies dysuria, increased frequency, flank pain. Endocrine: Denies hot or cold  intolerance, polyuria, polydipsia. Musculoskeletal: Denies myalgias, back pain, joint swelling, arthralgias, gait problems.  Skin: Denies pallor, rash, suspicious lesions.  Neurological: Denies dizziness, seizures, syncope, unexplained weakness, lightheadedness, numbness and headaches.  Psychiatric/Behavioral: Denies mood changes, suicidal or homicidal ideations, hallucinations, sleep disturbances.    Objective:    Blood pressure 120/82, pulse 77, height 5' 5.5" (1.664 m), weight 227 lb (102.967 kg). Body mass index is 37.19 kg/(m^2). General: Well Developed, well nourished, and in no acute distress.  HEENT: Normocephalic, atraumatic, pupils equal round reactive to light, neck supple, No carotid bruits no JVD Skin: Warm and dry, cap RF less 2 sec Cardiac: Regular rate and rhythm, S1, S2 WNL's, no murmurs rubs or gallops Respiratory: ECTA B/L, Not using accessory muscles, speaking in full sentences. NeuroM-Sk: Ambulates w/o assistance, moves ext * 4 w/o difficulty, sensation grossly intact.  Psych: A and O *3, judgement and insight good.       Recent Results (from the past 2160 hour(s))  CBC with Differential/Platelet     Status: Abnormal   Collection Time: 08/31/15  8:56 AM  Result Value Ref Range   WBC 9.8 3.8 - 10.8 K/uL   RBC 4.63 3.80 - 5.10 MIL/uL   Hemoglobin 13.4 11.7 - 15.5 g/dL   HCT 40.4 35.0 - 45.0 %   MCV 87.3 80.0 - 100.0 fL   MCH 28.9 27.0 - 33.0 pg   MCHC 33.2 32.0 - 36.0 g/dL   RDW 14.6 11.0 - 15.0 %   Platelets 411 (H) 140 - 400 K/uL   MPV 10.3 7.5 - 12.5 fL   Neutro Abs 6076 1500 - 7800 cells/uL   Lymphs Abs 2254 850 - 3900 cells/uL   Monocytes Absolute 882 200 - 950 cells/uL   Eosinophils Absolute 588 (H) 15 - 500 cells/uL   Basophils Absolute 0 0 - 200 cells/uL   Neutrophils Relative % 62 %   Lymphocytes Relative 23 %   Monocytes Relative 9 %   Eosinophils Relative 6 %   Basophils Relative 0 %   Smear Review Criteria for review not met     Comment:  ** Please note change in unit of measure and reference range(s). **  COMPLETE METABOLIC PANEL WITH GFR     Status: Abnormal   Collection Time: 08/31/15  8:56 AM  Result Value Ref Range   Sodium 137 135 - 146 mmol/L   Potassium 3.9 3.5 - 5.3 mmol/L   Chloride 98 98 - 110 mmol/L   CO2 29 20 - 31 mmol/L   Glucose, Bld 120 (  H) 65 - 99 mg/dL   BUN 17 7 - 25 mg/dL   Creat 0.91 0.60 - 0.93 mg/dL    Comment:   For patients > or = 73 years of age: The upper reference limit for Creatinine is approximately 13% higher for people identified as African-American.      Total Bilirubin 0.6 0.2 - 1.2 mg/dL   Alkaline Phosphatase 77 33 - 130 U/L   AST 24 10 - 35 U/L   ALT 27 6 - 29 U/L   Total Protein 6.4 6.1 - 8.1 g/dL   Albumin 3.9 3.6 - 5.1 g/dL   Calcium 9.0 8.6 - 10.4 mg/dL   GFR, Est African American 72 >=60 mL/min   GFR, Est Non African American 63 >=60 mL/min  Hemoglobin A1c     Status: Abnormal   Collection Time: 08/31/15  8:56 AM  Result Value Ref Range   Hgb A1c MFr Bld 6.4 (H) <5.7 %    Comment:   For someone without known diabetes, a hemoglobin A1c value between 5.7% and 6.4% is consistent with prediabetes and should be confirmed with a follow-up test.   For someone with known diabetes, a value <7% indicates that their diabetes is well controlled. A1c targets should be individualized based on duration of diabetes, age, co-morbid conditions and other considerations.   This assay result is consistent with an increased risk of diabetes.   Currently, no consensus exists regarding use of hemoglobin A1c for diagnosis of diabetes in children.      Mean Plasma Glucose 137 mg/dL  Lipid panel     Status: Abnormal   Collection Time: 08/31/15  8:56 AM  Result Value Ref Range   Cholesterol 294 (H) 125 - 200 mg/dL   Triglycerides 175 (H) <150 mg/dL   HDL 35 (L) >=46 mg/dL   Total CHOL/HDL Ratio 8.4 (H) <=5.0 Ratio   VLDL 35 (H) <30 mg/dL   LDL Cholesterol 224 (H) <130 mg/dL     Comment:   Total Cholesterol/HDL Ratio:CHD Risk                        Coronary Heart Disease Risk Table                                        Men       Women          1/2 Average Risk              3.4        3.3              Average Risk              5.0        4.4           2X Average Risk              9.6        7.1           3X Average Risk             23.4       11.0 Use the calculated Patient Ratio above and the CHD Risk table  to determine the patient's CHD Risk.   TSH     Status: None   Collection Time: 08/31/15  8:56 AM  Result Value  Ref Range   TSH 2.53 mIU/L    Comment:   Reference Range   > or = 20 Years  0.40-4.50   Pregnancy Range First trimester  0.26-2.66 Second trimester 0.55-2.73 Third trimester  0.43-2.91     Vitamin B12     Status: None   Collection Time: 08/31/15  8:56 AM  Result Value Ref Range   Vitamin B-12 515 200 - 1100 pg/mL  VITAMIN D 25 Hydroxy (Vit-D Deficiency, Fractures)     Status: Abnormal   Collection Time: 08/31/15  8:56 AM  Result Value Ref Range   Vit D, 25-Hydroxy 29 (L) 30 - 100 ng/mL    Comment: Vitamin D Status           25-OH Vitamin D        Deficiency                <20 ng/mL        Insufficiency         20 - 29 ng/mL        Optimal             > or = 30 ng/mL   For 25-OH Vitamin D testing on patients on D2-supplementation and patients for whom quantitation of D2 and D3 fractions is required, the QuestAssureD 25-OH VIT D, (D2,D3), LC/MS/MS is recommended: order code [id number] (patients > 2 yrs).         Impression and Recommendations:    The patient was counselled, risk factors were discussed, anticipatory guidance given.  Extensive health counseling was performed when reviewing all the labs; esp what patient needs to be doing from a lifestyle standpoint to improve them in addition to any med changes.   Pt was in the office today for 40+ minutes, with over 50% time spent in face to face counseling of various medical  concerns and in coordination of care  Essential hypertension, benign This is well controlled. Continue current medicines.  Constipation Discussed the patient adequate water intake. Very important especially with these medicines we're putting her on  HLD (hyperlipidemia) increase dose of statin needed. Risks and benefits of this medicines discussed with patient. Imperative that she drink more water.     Recheck lab work 6-8 weeks  Vitamin D deficiency Vitamin D 5000 IUs daily. Patient prefers daily over weekly doses. Likely she will need this the rest of her life. Told her not to stop it until I tell her otherwise. Calcium supplementation discussed with patient and importance of that as well.  Prediabetes Extensive counseling on disease process and treatment plan of lifestyle modifications. Recheck A1c 4 months  Low HDL (under 40) Routine counseling done. Move more.  Hypertriglyceridemia Handouts provided and counseling done. Risk benefits of medicines reviewed with patient. Continue Lovaza and start niacin.  Clinical depression Continue medication. Restarting Cymbalta seems to have  had a positive effect. Continue dose.  Patient discussed with me of her strong family history of heart disease on both sides of her family. None early onset.  Handouts provided  Meds ordered this encounter  Medications  . simvastatin (ZOCOR) 40 MG tablet    Sig: Take 1 tablet (40 mg total) by mouth at bedtime.    Dispense:  90 tablet    Refill:  3  . niacin (NIASPAN) 1000 MG CR tablet    Sig: One half a tablet nightly before bed    Dispense:  90 tablet    Refill:  1  Please see AVS handed out to patient at the end of our visit for further patient instructions/ counseling done pertaining to today's office visit.  Gross side effects, risk and benefits, and alternatives of medications discussed with patient.  Patient is aware that all medications have potential side effects and we are unable  to predict every sideeffect or drug-drug interaction that may occur.  Expresses verbal understanding and consents to current therapy plan and treatment regiment.  Note: This document was prepared using Dragon voice recognition software and may include unintentional dictation errors.

## 2015-09-06 NOTE — Assessment & Plan Note (Signed)
Vitamin D 5000 IUs daily. Patient prefers daily over weekly doses. Likely she will need this the rest of her life. Told her not to stop it until I tell her otherwise. Calcium supplementation discussed with patient and importance of that as well.

## 2015-09-06 NOTE — Assessment & Plan Note (Signed)
Extensive counseling on disease process and treatment plan of lifestyle modifications. Recheck A1c 4 months

## 2015-09-06 NOTE — Patient Instructions (Addendum)
Take 2 of your simvastatin that you have at home for 2 weeks and then increase to the 40 mg nightly before bed.  Explained to patient that she must drink more water or she will end up with myalgias and or arthralgias.  Start vitamin D3 5000 international units daily, OTC.  Explained to patient that she should likely take this consistently for the rest of her life and we will recheck this in 4-6 months.   Important to have adequate calcium intake as well which we discussed of at least 1000   Check your fasting sugars the morning after you eat especially healthy or you eat especially poor so that you can see what those foods due to your sugar levels      Niacin extended-release tablets or capsules What is this medicine? NIACIN (NYE a sin) is used in combination with a healthy diet to lower bad cholesterol and increase good cholesterol. This medicine is also used to decrease triglycerides. If triglycerides are too high, you may be at risk of developing pancreatitis. This is a painful condition that causes inflammation of the pancreas and can lead to serious health problems. This medicine can also be helpful in patients who have heart disease or who have had a heart attack. This medicine may be used for other purposes; ask your health care provider or pharmacist if you have questions. What should I tell my health care provider before I take this medicine? They need to know if you have any of these conditions: -bleeding problems -frequently drink alcoholic beverages -liver disease -ulcers of intestine or stomach -an unusual or allergic reaction to niacin, other medicines, foods, dyes, or preservatives -pregnant or trying or get pregnant -breast-feeding How should I use this medicine? Take this medicine by mouth with a glass of water. Follow the directions on the prescription label. Do not crush, cut, or chew. Take with a low-fat meal or snack. Take your doses at regular intervals. Do not take  your medicine more often than directed. Do not stop taking except on the advice of your doctor or health care professional. Talk to your pediatrician regarding the use of this medicine in children. Special care may be needed. Overdosage: If you think you have taken too much of this medicine contact a poison control center or emergency room at once. NOTE: This medicine is only for you. Do not share this medicine with others. What if I miss a dose? If you miss a dose, take it as soon as you can. If it is almost time for your next dose, take only that dose. Do not take double or extra doses. What may interact with this medicine? -aspirin -medicines for blood pressure, chest pain, or heart disease -nitroglycerin -nutritional supplements that contain niacin or nicotinamide -other medicines to lower cholesterol or triglycerides This list may not describe all possible interactions. Give your health care provider a list of all the medicines, herbs, non-prescription drugs, or dietary supplements you use. Also tell them if you smoke, drink alcohol, or use illegal drugs. Some items may interact with your medicine. What should I watch for while using this medicine? Visit your doctor or health care professional for regular checks on your progress. You may need regular tests to make sure your liver is working properly. You may get drowsy or dizzy. Do not drive, use machinery, or do anything that needs mental alertness until you know how this drug affects you. Do not stand or sit up quickly, especially if you are  an older patient. This reduces the risk of dizzy or fainting spells. Do not drink hot drinks or alcohol at the same time you take this medicine. Hot drinks and alcohol can increase the flushing caused by this medicine, which can be uncomfortable. Alcohol also can increase possible dizziness. This drug is only part of a total heart-health program. Your doctor or a dietician can suggest a low-cholesterol  and low-fat diet to help. Avoid alcohol and smoking, and keep a proper exercise schedule. If you are diabetic, close monitoring of your blood sugars can help your blood fat levels. This medicine may change the way your diabetic medicine works, and sometimes will require that your dosages be adjusted. Check with your doctor or health care professional. You may notice the empty shell of the tablet in your stool. This is no cause for concern. What side effects may I notice from receiving this medicine? Side effects that you should report to your doctor or health care professional as soon as possible: -dark yellow or brown urine -fainting spells -grayish stool color -nausea, vomiting -palpitations -severe stomach pain and loss of appetite -shortness of breath, wheezing -skin rash and itching -weakness or tiredness -yellowing of skin or eyes Side effects that usually do not require medical attention (report to your doctor or health care professional if they continue or are bothersome): -diarrhea -headache -stomach discomfort or bloating This list may not describe all possible side effects. Call your doctor for medical advice about side effects. You may report side effects to FDA at 1-800-FDA-1088. Where should I keep my medicine? Keep out of the reach of children. Store at room temperature between 20 and 25 degrees C (68 and 77 degrees F). Throw away any unused medicine after the expiration date. NOTE: This sheet is a summary. It may not cover all possible information. If you have questions about this medicine, talk to your doctor, pharmacist, or health care provider.    2016, Elsevier/Gold Standard. (2007-04-23 16:22:37)     Food Choices to Lower Your Triglycerides Triglycerides are a type of fat in your blood. High levels of triglycerides can increase the risk of heart disease and stroke. If your triglyceride levels are high, the foods you eat and your eating habits are very important.  Choosing the right foods can help lower your triglycerides.  WHAT GENERAL GUIDELINES DO I NEED TO FOLLOW?  Lose weight if you are overweight.   Limit or avoid alcohol.   Fill one half of your plate with vegetables and green salads.   Limit fruit to two servings a day. Choose fruit instead of juice.   Make one fourth of your plate whole grains. Look for the word "whole" as the first word in the ingredient list.  Fill one fourth of your plate with lean protein foods.  Enjoy fatty fish (such as salmon, mackerel, sardines, and tuna) three times a week.   Choose healthy fats.   Limit foods high in starch and sugar.  Eat more home-cooked food and less restaurant, buffet, and fast food.  Limit fried foods.  Cook foods using methods other than frying.  Limit saturated fats.  Check ingredient lists to avoid foods with partially hydrogenated oils (trans fats) in them. WHAT FOODS CAN I EAT?  Grains Whole grains, such as whole wheat or whole grain breads, crackers, cereals, and pasta. Unsweetened oatmeal, bulgur, barley, quinoa, or brown rice. Corn or whole wheat flour tortillas.  Vegetables Fresh or frozen vegetables (raw, steamed, roasted, or grilled). Green salads.  Fruits All fresh, canned (in natural juice), or frozen fruits. Meat and Other Protein Products Ground beef (85% or leaner), grass-fed beef, or beef trimmed of fat. Skinless chicken or [place]. Ground chicken or [place]. Pork trimmed of fat. All fish and seafood. Eggs. Dried beans, peas, or lentils. Unsalted nuts or seeds. Unsalted canned or dry beans. Dairy Low-fat dairy products, such as skim or 1% milk, 2% or reduced-fat cheeses, low-fat ricotta or cottage cheese, or plain low-fat yogurt. Fats and Oils Tub margarines without trans fats. Light or reduced-fat mayonnaise and salad dressings. Avocado. Safflower, olive, or canola oils. Natural peanut or almond butter. The items listed above may not be a complete list of  recommended foods or beverages. Contact your dietitian for more options. WHAT FOODS ARE NOT RECOMMENDED?  Grains White bread. White pasta. White rice. Cornbread. Bagels, pastries, and croissants. Crackers that contain trans fat. Vegetables White potatoes. Corn. Creamed or fried vegetables. Vegetables in a cheese sauce. Fruits Dried fruits. Canned fruit in light or heavy syrup. Fruit juice. Meat and Other Protein Products Fatty cuts of meat. Ribs, chicken wings, bacon, sausage, bologna, salami, chitterlings, fatback, hot dogs, bratwurst, and packaged luncheon meats. Dairy Whole or 2% milk, cream, half-and-half, and cream cheese. Whole-fat or sweetened yogurt. Full-fat cheeses. Nondairy creamers and whipped toppings. Processed cheese, cheese spreads, or cheese curds. Sweets and Desserts Corn syrup, sugars, honey, and molasses. Candy. Jam and jelly. Syrup. Sweetened cereals. Cookies, pies, cakes, donuts, muffins, and ice cream. Fats and Oils Butter, stick margarine, lard, shortening, ghee, or bacon fat. Coconut, palm kernel, or palm oils. Beverages Alcohol. Sweetened drinks (such as sodas, lemonade, and fruit drinks or punches). The items listed above may not be a complete list of foods and beverages to avoid. Contact your dietitian for more information.   This information is not intended to replace advice given to you by your health care provider. Make sure you discuss any questions you have with your health care provider.   Document Released: 11/23/2003 Document Revised: 02/25/2014 Document Reviewed: 12/09/2012 Elsevier Interactive Patient Education 2016 [place] for a Low Cholesterol, Low Saturated Fat Diet   Fats - Limit total intake of fats and oils. - Avoid butter, stick margarine, shortening, lard, palm and coconut oils. - Limit mayonnaise, salad dressings, gravies and sauces, unless they are homemade with low-fat ingredients. - Limit chocolate. - Choose  low-fat and nonfat products, such as low-fat mayonnaise, low-fat or non-hydrogenated peanut butter, low-fat or fat-free salad dressings and nonfat gravy. - Use vegetable oil, such as canola or olive oil. - Look for margarine that does not contain trans fatty acids. - Use nuts in moderate amounts. - Read ingredient labels carefully to determine both amount and type of fat present in foods. Limit saturated and trans fats! - Avoid high-fat processed and convenience foods.  Meats and Meat Alternatives - Choose fish, chicken, [place] and lean meats. - Use dried beans, peas, lentils and tofu. - Limit egg yolks to three to four per week. - If you eat red meat, limit to no more than three servings per week and choose loin or round cuts. - Avoid fatty meats, such as bacon, sausage, franks, luncheon meats and ribs. - Avoid all organ meats, including liver.  Dairy - Choose nonfat or low-fat milk, yogurt and cottage cheese. - Most cheeses are high in fat. Choose cheeses made from non-fat milk, such as mozzarella and ricotta cheese. - Choose light or fat-free cream cheese and sour  cream. - Avoid cream and sauces made with cream.  Fruits and Vegetables - Eat a wide variety of fruits and vegetables. - Use lemon juice, vinegar or "mist" olive oil on vegetables. - Avoid adding sauces, fat or oil to vegetables.  Breads, Cereals and Grains - Choose whole-grain breads, cereals, pastas and rice. - Avoid high-fat snack foods, such as granola, cookies, pies, pastries, doughnuts and croissants.  Cooking Tips - Avoid deep fried foods. - Trim visible fat off meats and remove skin from poultry before cooking. - Bake, broil, boil, poach or roast poultry, fish and lean meats. - Drain and discard fat that drains out of meat as you cook it. - Add little or no fat to foods. - Use vegetable oil sprays to grease pans for cooking or baking. - Steam vegetables. - Use herbs or no-oil marinades to flavor  foods.    Diabetes and Exercise Exercising regularly is important. It is not just about losing weight. It has many health benefits, such as:  Improving your overall fitness, flexibility, and endurance.  Increasing your bone density.  Helping with weight control.  Decreasing your body fat.  Increasing your muscle strength.  Reducing stress and tension.  Improving your overall health. People with diabetes who exercise gain additional benefits because exercise:  Reduces appetite.  Improves the body's use of blood sugar (glucose).  Helps lower or control blood glucose.  Decreases blood pressure.  Helps control blood lipids (such as cholesterol and triglycerides).  Improves the body's use of the hormone insulin by:  Increasing the body's insulin sensitivity.  Reducing the body's insulin needs.  Decreases the risk for heart disease because exercising:  Lowers cholesterol and triglycerides levels.  Increases the levels of good cholesterol (such as high-density lipoproteins [HDL]) in the body.  Lowers blood glucose levels. YOUR ACTIVITY PLAN  Choose an activity that you enjoy, and set realistic goals. To exercise safely, you should begin practicing any new physical activity slowly, and gradually increase the intensity of the exercise over time. Your health care provider or diabetes educator can help create an activity plan that works for you. General recommendations include:  Encouraging children to engage in at least 60 minutes of physical activity each day.  Stretching and performing strength training exercises, such as yoga or weight lifting, at least 2 times per week.  Performing a total of at least 150 minutes of moderate-intensity exercise each week, such as brisk walking or water aerobics.  Exercising at least 3 days per week, making sure you allow no more than 2 consecutive days to pass without exercising.  Avoiding long periods of inactivity (90 minutes or  more). When you have to spend an extended period of time sitting down, take frequent breaks to walk or stretch. RECOMMENDATIONS FOR EXERCISING WITH TYPE 1 OR TYPE 2 DIABETES   Check your blood glucose before exercising. If blood glucose levels are greater than 240 mg/dL, check for urine ketones. Do not exercise if ketones are present.  Avoid injecting insulin into areas of the body that are going to be exercised. For example, avoid injecting insulin into:  The arms when playing tennis.  The legs when jogging.  Keep a record of:  Food intake before and after you exercise.  Expected peak times of insulin action.  Blood glucose levels before and after you exercise.  The type and amount of exercise you have done.  Review your records with your health care provider. Your health care provider will help you to  develop guidelines for adjusting food intake and insulin amounts before and after exercising.  If you take insulin or oral hypoglycemic agents, watch for signs and symptoms of hypoglycemia. They include:  Dizziness.  Shaking.  Sweating.  Chills.  Confusion.  Drink plenty of water while you exercise to prevent dehydration or heat stroke. Body water is lost during exercise and must be replaced.  Talk to your health care provider before starting an exercise program to make sure it is safe for you. Remember, almost any type of activity is better than none.   This information is not intended to replace advice given to you by your health care provider. Make sure you discuss any questions you have with your health care provider.   Document Released: 04/27/2003 Document Revised: 06/21/2014 Document Reviewed: 07/14/2012 Elsevier Interactive Patient Education 2016 [place] factors for prediabetes and type 2 diabetes Researchers don't fully understand why some people develop prediabetes and type 2 diabetes and others don't.  It's clear that certain factors increase  the risk, however, including:  Weight. The more fatty tissue you have, the more resistant your cells become to insulin.  Inactivity. The less active you are, the greater your risk. Physical activity helps you control your weight, uses up glucose as energy and makes your cells more sensitive to insulin.  Family history. Your risk increases if a parent or sibling has type 2 diabetes.  Race. Although it's unclear why, people of certain races - including blacks, Hispanics, American Indians and Asian-Americans - are at higher risk.  Age. Your risk increases as you get older. This may be because you tend to exercise less, lose muscle mass and gain weight as you age. But type 2 diabetes is also increasing dramatically among children, adolescents and younger adults.  Gestational diabetes. If you developed gestational diabetes when you were pregnant, your risk of developing prediabetes and type 2 diabetes later increases. If you gave birth to a baby weighing more than 9 pounds (4 kilograms), you're also at risk of type 2 diabetes.  Polycystic ovary syndrome. For women, having polycystic ovary syndrome - a common condition characterized by irregular menstrual periods, excess hair growth and obesity - increases the risk of diabetes.  High blood pressure. Having blood pressure over 140/90 millimeters of mercury (mm Hg) is linked to an increased risk of type 2 diabetes.  Abnormal cholesterol and triglyceride levels. If you have low levels of high-density lipoprotein (HDL), or "good," cholesterol, your risk of type 2 diabetes is higher. Triglycerides are another type of fat carried in the blood. People with high levels of triglycerides have an increased risk of type 2 diabetes. Your doctor can let you know what your cholesterol and triglyceride levels are.    A good guide to good carbs: The glycemic index ---If you have diabetes, or at risk for diabetes, you know all too well that when you eat carbohydrates, your  blood sugar goes up. The total amount of carbs you consume at a meal or in a snack mostly determines what your blood sugar will do. But the food itself also plays a role. A serving of white rice has almost the same effect as eating pure table sugar - a quick, high spike in blood sugar. A serving of lentils has a slower, smaller effect.  ---Picking good sources of carbs can help you control your blood sugar and your weight. Even if you don't have diabetes, eating healthier carbohydrate-rich foods can help ward  off a host of chronic conditions, from heart disease to various cancers to, well, diabetes.  ---One way to choose foods is with the glycemic index (GI). This tool measures how much a food boosts blood sugar.  The glycemic index rates the effect of a specific amount of a food on blood sugar compared with the same amount of pure glucose. A food with a glycemic index of 28 boosts blood sugar only 28% as much as pure glucose. One with a GI of 95 acts like pure glucose.  High glycemic foods result in a quick spike in insulin and blood sugar (also known as blood glucose). Low glycemic foods have a slower, smaller effect.   Using the glycemic index Using the glycemic index is easy: choose foods in the low GI category instead of those in the high GI category (see below), and go easy on those in between. Low glycemic index (GI of 55 or less): Most fruits and vegetables, beans, minimally processed grains, pasta, low-fat dairy foods, and nuts.  Moderate glycemic index (GI 56 to 69): White and sweet potatoes, corn, white rice, couscous, breakfast cereals such as Cream of Wheat and Mini Wheats.  High glycemic index (GI of 70 or higher): White bread, rice cakes, most crackers, bagels, cakes, doughnuts, croissants, most packaged breakfast cereals. You can see the values for 100 commons foods and get links to more at www.health.CheapToothpicks.si.  Swaps for lowering glycemic index  Instead of this  high-glycemic index food Eat this lower-glycemic index food  White rice Brown rice or converted rice  Instant oatmeal Steel-cut oats  Cornflakes Bran flakes  Baked potato Pasta, bulgur  White bread Whole-grain bread  Corn Peas or leafy greens

## 2015-09-06 NOTE — Assessment & Plan Note (Addendum)
Discussed the patient adequate water intake. Very important especially with these medicines we're putting her on

## 2015-09-06 NOTE — Assessment & Plan Note (Signed)
Continue medication. Restarting Cymbalta seems to have  had a positive effect. Continue dose.

## 2015-09-06 NOTE — Assessment & Plan Note (Addendum)
>>  ASSESSMENT AND PLAN FOR MIXED HYPERLIPIDEMIA WRITTEN ON 09/10/2022  8:13 AM BY Latricia Cerrito A, PA  >>ASSESSMENT AND PLAN FOR MIXED HYPERLIPIDEMIA WRITTEN ON 05/31/2022  8:12 AM BY Aimy Sweeting A, PA  >>ASSESSMENT AND PLAN FOR HLD (HYPERLIPIDEMIA) WRITTEN ON 09/06/2015 12:23 PM BY OPALSKI, DEBORAH, DO  increase dose of statin needed. Risks and benefits of this medicines discussed with patient. Imperative that she drink more water.     Recheck lab work 6-8 weeks  >>ASSESSMENT AND PLAN FOR HYPERTRIGLYCERIDEMIA WRITTEN ON 09/06/2015 12:23 PM BY OPALSKI, DEBORAH, DO  Handouts provided and counseling done. Risk benefits of medicines reviewed with patient. Continue Lovaza and start niacin.  >>ASSESSMENT AND PLAN FOR LOW HDL (UNDER 40) WRITTEN ON 1/61/0960 12:23 PM BY OPALSKI, DEBORAH, DO  Routine counseling done. Move more.

## 2015-09-06 NOTE — Assessment & Plan Note (Addendum)
increase dose of statin needed. Risks and benefits of this medicines discussed with patient. Imperative that she drink more water.     Recheck lab work 6-8 weeks

## 2015-09-06 NOTE — Assessment & Plan Note (Signed)
Handouts provided and counseling done. Risk benefits of medicines reviewed with patient. Continue Lovaza and start niacin.

## 2015-09-08 ENCOUNTER — Encounter: Payer: Self-pay | Admitting: Family Medicine

## 2015-09-09 NOTE — Assessment & Plan Note (Signed)
Dietary changes and increasing activity levels discussed with patient. Advised her to go to the [facility] on a more regular basis.

## 2015-09-09 NOTE — Assessment & Plan Note (Signed)
Patient had been on Cymbalta in the past and it worked well for her. We will restart it.  Stress management techniques and activities discussed with patient. Advised her to seek a counselor of some sort either through church or other means. Patient will look into.

## 2015-09-09 NOTE — Assessment & Plan Note (Signed)
We will get fasting labs in near future. Patient understands we may need to restart her statin.  She doesn't really think it made a big difference with her generalized myalgias and all once she stopped the medicine.

## 2015-09-09 NOTE — Assessment & Plan Note (Signed)
Risks and benefits of medications discussed with patient. opted for the trazodone. Advised to start low and increase slowly. Good sleep hygiene discussed with patient. Advise sleep meditation and other options.  Importance of exercise during the day discussed.

## 2015-09-09 NOTE — Assessment & Plan Note (Signed)
Well controlled for age at current dose. Continue Hyzaar and Norvasc.

## 2015-09-09 NOTE — Assessment & Plan Note (Signed)
Likely secondary to increased stress levels but we will check TSH, B12, it doesn't improve with improvement of her depression and stress levels, we will consider dementia workup

## 2015-09-09 NOTE — Assessment & Plan Note (Signed)
>>  ASSESSMENT AND PLAN FOR HLD (HYPERLIPIDEMIA) WRITTEN ON 09/09/2015  4:51 PM BY OPALSKI, DEBORAH, DO  We will get fasting labs in near future. Patient understands we may need to restart her statin.  She doesn't really think it made a big difference with her generalized myalgias and all once she stopped the medicine.

## 2015-09-09 NOTE — Assessment & Plan Note (Signed)
History of having kidney vitamin D so we will recheck levels.

## 2015-09-09 NOTE — Progress Notes (Signed)
Shelly Hickman, D.O. Primary care at [place]:    Chief Complaint  Patient presents with  . Establish Care   New pt, here to establish care.   HPI: Shelly Hickman is a pleasant 75 y.o. female who presents to [place] at [facility] today To become established. Last primary care physician was Dr. Rachell Cipro. Patient wishes to be closer to home.  Patient moved down to [place] in 1973. 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 She's been married for 89 years and becomes tearful when asked if she has been happily married.  They are strange to per patient and live under the same roof but sure no interests. This causes a lot of stress for patient and she says she is stuck in the marriage and unable to leave him at this time.  Causes a lot of stress and depression for patient. She is tearful the office.  2 children: One son and one daughter.  She worries a lot about both of them. Her son is 2 and his homosexual man living in [place]. She worries about his well-being.   28 year old daughter, Caprice Red, lives in [place] and her husband was recently diagnosed with esophageal cancer. He has been becoming significantly more weak and debilitated in recent weeks, and they are not sure how much longer he has.  Initially diagnosed February 2016.   --All of these issues cause a lot of emotional stress on patient and she complains of depression and anxiety as well as difficulty sleeping.  Morbidly obese: Patient is not satisfied with her weight and is aware she needs to lose some. She has very little exercise and occasionally walks at the [facility] but has not been there in some time.  Hypertension: Patient does not check it at home but states it's always been very well controlled. Takes her medicines as prescribed  Hyperlipidemia: Patient has a history of this for several years but stopped her medications approximately 2-3 months ago she  believes because she was having generalized myalgias. However, she does state the myalgias started once she started getting more depressed and stressed, and she stopped walking at the [facility].      Past Medical History  Diagnosis Date  . Hypercholesterolemia   . Depression   . Anxiety   . Anemia   . H/O hiatal hernia   . Incontinence of urine       Past Surgical History  Procedure Laterality Date  . Right knee arthroscopy    . Abdominal hysterectomy    . 2 synovial cysts removed between l4-l5    . Tonsillectomy    . Back surgery      back fusion after 2 back surgeries   . Eye surgery      tube inserted for excessive tearing , tube is removed   . Total hip arthroplasty Left 07/09/2013    Procedure: TOTAL HIP ARTHROPLASTY;  Surgeon: Kerin Salen, MD;  Location: [place];  Service: Orthopedics;  Laterality: Left;      Family History  Problem Relation Age of Onset  . Hypertension Mother   . Diabetes Mother   . Stroke Mother   . Alcoholism Father   . Alcohol abuse Father   . Depression Sister       History  Drug Use No  ,    History  Alcohol Use No  ,    History  Smoking status  . Former Smoker  . Types: Cigarettes  . Quit date: 02/19/1995  Smokeless tobacco  . Never Used  ,     History  Sexual Activity  . Sexual Activity: Not Currently      Patient's Medications  New Prescriptions   NIACIN (NIASPAN) 1000 MG CR TABLET    One half a tablet nightly before bed   SIMVASTATIN (ZOCOR) 40 MG TABLET    Take 1 tablet (40 mg total) by mouth at bedtime.  Previous Medications   AMLODIPINE (NORVASC) 5 MG TABLET    Take 5 mg by mouth daily before breakfast.    ASPIRIN EC 325 MG TABLET    Take 1 tablet (325 mg total) by mouth 2 (two) times daily.   CALCIUM CARBONATE (TUMS EX) 750 MG CHEWABLE TABLET    Chew 2 tablets by mouth daily as needed for heartburn.   CHOLECALCIFEROL (VITAMIN D) 1000 UNITS TABLET    Take 5,000 Units by mouth daily.   FLUTICASONE  (FLONASE) 50 MCG/ACT NASAL SPRAY    Place 2 sprays into the nose daily.   LOSARTAN-HYDROCHLOROTHIAZIDE (HYZAAR) 100-25 MG PER TABLET    Take 1 tablet by mouth daily before breakfast.    MINERAL OIL-HYDROPHILIC PETROLATUM (AQUAPHOR) OINTMENT    Apply 1 application topically as needed for dry skin.   MONTELUKAST (SINGULAIR) 10 MG TABLET    Take 10 mg by mouth daily before breakfast.    OMEGA-3 ACID ETHYL ESTERS (LOVAZA) 1 G CAPSULE    Take 1 g by mouth 2 (two) times daily. Reported on 08/30/2015  Modified Medications   Modified Medication Previous Medication   DULOXETINE (CYMBALTA) 60 MG CAPSULE DULoxetine (CYMBALTA) 60 MG capsule      Take 2 capsules (120 mg total) by mouth daily before breakfast.    Take 120 mg by mouth daily before breakfast.    TRAZODONE (DESYREL) 100 MG TABLET traZODone (DESYREL) 100 MG tablet      Take 1 tablet (100 mg total) by mouth at bedtime. Reported on 08/30/2015    Take 200 mg by mouth at bedtime. Reported on 08/30/2015  Discontinued Medications   HYDROCODONE-ACETAMINOPHEN (NORCO) 7.5-325 MG PER TABLET    Take 1 tablet by mouth every 3 (three) hours as needed for moderate pain.   LORAZEPAM (ATIVAN) 0.5 MG TABLET    Take 0.5 mg by mouth at bedtime as needed for anxiety. Reported on 08/30/2015   POLYETHYLENE GLYCOL POWDER (GLYCOLAX/MIRALAX) POWDER    Take 17 g by mouth daily.   SIMVASTATIN (ZOCOR) 10 MG TABLET    Take 10 mg by mouth at bedtime.   SIMVASTATIN (ZOCOR) 10 MG TABLET    Take 1 tablet (10 mg total) by mouth at bedtime.   TIZANIDINE (ZANAFLEX) 2 MG CAPSULE    Take 1 capsule (2 mg total) by mouth every 6 (six) hours.    Allergies  Allergen Reactions  . Avelox [Moxifloxacin Hcl In Nacl] Other (See Comments)    Chest pain  . Cephalosporins Hives    Review of Systems:   ( Completed via Adult Medical History Intake form today ) General:   Denies fever, chills, appetite changes, unexplained weight loss.  Optho/Auditory:   Denies visual changes, blurred  vision/LOV, ringing in ears/ diff hearing Respiratory:   Denies SOB, DOE, cough, wheezing.  Cardiovascular:   Denies chest pain, palpitations, new onset peripheral edema  Gastrointestinal:   Denies nausea, vomiting, diarrhea.  Genitourinary:    Denies dysuria, increased frequency, flank pain.  Endocrine:     Denies hot or cold intolerance, polyuria, polydipsia. Musculoskeletal:  Denies unexplained myalgias, joint swelling, arthralgias, gait problems.  Skin:  Denies rash, suspicious lesions or new/ changes in moles Neurological:    Denies dizziness, syncope, unexplained weakness, lightheadedness, numbness  Psychiatric/Behavioral:   Denies mood changes, suicidal or homicidal ideations, hallucinations    Objective:   Blood pressure 136/82, pulse 92, height 5' 5.5" (1.664 m), weight 223 lb 11.2 oz (101.47 kg). Body mass index is 36.65 kg/(m^2).  General: Well Developed, well nourished, and in no acute distress.  Neuro: Alert and oriented x3, extra-ocular muscles intact, sensation grossly intact.  HEENT: Normocephalic, atraumatic, pupils equal round reactive to light, neck supple, no gross masses, no carotid bruits, no JVD apprec Skin: no gross suspicious lesions or rashes  Cardiac: Regular rate and rhythm, no murmurs rubs or gallops.  Respiratory: Essentially clear to auscultation bilaterally. Not using accessory muscles, speaking in full sentences.  Abdominal: Soft, not grossly distended Musculoskeletal: Ambulates w/o diff, FROM * 4 ext.  Vasc: less 2 sec cap RF, warm and pink  Psych:  No HI/SI, judgement and insight good.    Impression and Recommendations:    Essential hypertension, benign Well controlled for age at current dose. Continue Hyzaar and Norvasc.  HLD (hyperlipidemia) We will get fasting labs in near future. Patient understands we may need to restart her statin.  She doesn't really think it made a big difference with her generalized myalgias and all once she stopped the  medicine.  Insomnia Risks and benefits of medications discussed with patient. opted for the trazodone. Advised to start low and increase slowly. Good sleep hygiene discussed with patient. Advise sleep meditation and other options.  Importance of exercise during the day discussed.  Clinical depression Patient had been on Cymbalta in the past and it worked well for her. We will restart it.  Stress management techniques and activities discussed with patient. Advised her to seek a counselor of some sort either through church or other means. Patient will look into.  Memory impairment Likely secondary to increased stress levels but we will check TSH, B12, it doesn't improve with improvement of her depression and stress levels, we will consider dementia workup  Vitamin D deficiency History of having kidney vitamin D so we will recheck levels.  Morbid obesity ([place]) Dietary changes and increasing activity levels discussed with patient. Advised her to go to the [facility] on a more regular basis.  Health counseling and anticipatory guidance provided.Pt was in the office today for 40+ minutes, with over 50% time spent in face to face counseling of various medical concerns and in coordination of care. The patient was counseled, risk factors were discussed, anticipatory guidance given. Gross side effects, risk and benefits, and alternatives of medications discussed with patient.  Patient is aware that all medications have potential side effects and we are unable to predict every side effect or drug-drug interaction that may occur.  Expresses verbal understanding and consents to current therapy plan and treatment regimen.  Please see AVS handed out to patient at the end of our visit for further patient instructions/ counseling done pertaining to today's office visit.     Orders Placed This Encounter  Procedures  . HM DEXA SCAN  . CBC with Differential/Platelet    Standing Status: Future     Number of  Occurrences: 1     Standing Expiration Date: 08/29/2016  . COMPLETE METABOLIC PANEL WITH GFR    Standing Status: Future  Number of Occurrences: 1     Standing Expiration Date: 08/29/2016  . Hemoglobin A1c    Standing Status: Future     Number of Occurrences: 1     Standing Expiration Date: 08/29/2016  . Lipid panel    Standing Status: Future     Number of Occurrences: 1     Standing Expiration Date: 08/29/2016    Order Specific Question:  Has the patient fasted?    Answer:  Yes  . TSH    Standing Status: Future     Number of Occurrences: 1     Standing Expiration Date: 08/29/2016  . Vitamin B12    Standing Status: Future     Number of Occurrences: 1     Standing Expiration Date: 08/29/2016  . VITAMIN D 25 Hydroxy (Vit-D Deficiency, Fractures)    Standing Status: Future     Number of Occurrences: 1     Standing Expiration Date: 08/29/2016     Note: This document was prepared using Dragon voice recognition software and may include unintentional dictation errors.

## 2015-10-18 ENCOUNTER — Ambulatory Visit (INDEPENDENT_AMBULATORY_CARE_PROVIDER_SITE_OTHER): Payer: PPO | Admitting: Family Medicine

## 2015-10-18 ENCOUNTER — Encounter: Payer: Self-pay | Admitting: Family Medicine

## 2015-10-18 VITALS — BP 107/72 | HR 91 | Ht 65.5 in | Wt 220.5 lb

## 2015-10-18 DIAGNOSIS — F32A Depression, unspecified: Secondary | ICD-10-CM

## 2015-10-18 DIAGNOSIS — E781 Pure hyperglyceridemia: Secondary | ICD-10-CM

## 2015-10-18 DIAGNOSIS — E786 Lipoprotein deficiency: Secondary | ICD-10-CM

## 2015-10-18 DIAGNOSIS — F329 Major depressive disorder, single episode, unspecified: Secondary | ICD-10-CM

## 2015-10-18 DIAGNOSIS — E785 Hyperlipidemia, unspecified: Secondary | ICD-10-CM

## 2015-10-18 DIAGNOSIS — M4806 Spinal stenosis, lumbar region: Secondary | ICD-10-CM

## 2015-10-18 DIAGNOSIS — F419 Anxiety disorder, unspecified: Secondary | ICD-10-CM

## 2015-10-18 DIAGNOSIS — M48061 Spinal stenosis, lumbar region without neurogenic claudication: Secondary | ICD-10-CM | POA: Insufficient documentation

## 2015-10-18 DIAGNOSIS — M4802 Spinal stenosis, cervical region: Secondary | ICD-10-CM

## 2015-10-18 MED ORDER — GABAPENTIN 100 MG PO CAPS: ORAL_CAPSULE | ORAL | 0 refills | Status: DC

## 2015-10-18 NOTE — Patient Instructions (Signed)
Neuropathic Pain Neuropathic pain is pain caused by damage to the nerves that are responsible for certain sensations in your body (sensory nerves). The pain can be caused by damage to:   The sensory nerves that send signals to your spinal cord and brain (peripheral nervous system).  The sensory nerves in your brain or spinal cord (central nervous system). Neuropathic pain can make you more sensitive to pain. What would be a minor sensation for most people may feel very painful if you have neuropathic pain. This is usually a long-term condition that can be difficult to treat. The type of pain can differ from person to person. It may start suddenly (acute), or it may develop slowly and last for a long time (chronic). Neuropathic pain may come and go as damaged nerves heal or may stay at the same level for years. It often causes emotional distress, loss of sleep, and a lower quality of life. CAUSES  The most common cause of damage to a sensory nerve is diabetes. Many other diseases and conditions can also cause neuropathic pain. Causes of neuropathic pain can be classified as:  Toxic. Many drugs and chemicals can cause toxic damage. The most common cause of toxic neuropathic pain is damage from drug treatment for cancer (chemotherapy).  Metabolic. This type of pain can happen when a disease causes imbalances that damage nerves. Diabetes is the most common of these diseases. Vitamin B deficiency caused by long-term alcohol abuse is another common cause.  Traumatic. Any injury that cuts, crushes, or stretches a nerve can cause damage and pain. A common example is feeling pain after losing an arm or leg (phantom limb pain).  Compression-related. If a sensory nerve gets trapped or compressed for a long period of time, the blood supply to the nerve can be cut off.  Vascular. Many blood vessel diseases can cause neuropathic pain by decreasing blood supply and oxygen to nerves.  Autoimmune. This type of  pain results from diseases in which the body's defense system mistakenly attacks sensory nerves. Examples of autoimmune diseases that can cause neuropathic pain include lupus and multiple sclerosis.  Infectious. Many types of viral infections can damage sensory nerves and cause pain. Shingles infection is a common cause of this type of pain.  Inherited. Neuropathic pain can be a symptom of many diseases that are passed down through families (genetic). SIGNS AND SYMPTOMS  The main symptom is pain. Neuropathic pain is often described as:  Burning.  Shock-like.  Stinging.  Hot or cold.  Itching. DIAGNOSIS  No single test can diagnose neuropathic pain. Your health care provider will do a physical exam and ask you about your pain. You may use a pain scale to describe how bad your pain is. You may also have tests to see if you have a high sensitivity to pain and to help find the cause and location of any sensory nerve damage. These tests may include:  Imaging studies, such as:  X-rays.  CT scan.  MRI.  Nerve conduction studies to test how well nerve signals travel through your sensory nerves (electrodiagnostic testing).  Stimulating your sensory nerves through electrodes on your skin and measuring the response in your spinal cord and brain (somatosensory evoked potentials). TREATMENT  Treatment for neuropathic pain may change over time. You may need to try different treatment options or a combination of treatments. Some options include:  Over-the-counter pain relievers.  Prescription medicines. Some medicines used to treat other conditions may also help neuropathic pain. These   include medicines to:  Control seizures (anticonvulsants).  Relieve depression (antidepressants).  Prescription-strength pain relievers (narcotics). These are usually used when other pain relievers do not help.  Transcutaneous nerve stimulation (TENS). This uses electrical currents to block painful nerve  signals. The treatment is painless.  Topical and local anesthetics. These are medicines that numb the nerves. They can be injected as a nerve block or applied to the skin.  Alternative treatments, such as:  Acupuncture.  Meditation.  Massage.  Physical therapy.  Pain management programs.  Counseling. HOME CARE INSTRUCTIONS  Learn as much as you can about your condition.  Take medicines only as directed by your health care provider.  Work closely with all your health care providers to find what works best for you.  Have a good support system at home.  Consider joining a chronic pain support group. SEEK MEDICAL CARE IF:  Your pain treatments are not helping.  You are having side effects from your medicines.  You are struggling with fatigue, mood changes, depression, or anxiety.   This information is not intended to replace advice given to you by your health care provider. Make sure you discuss any questions you have with your health care provider.   Document Released: 11/02/2003 Document Revised: 02/25/2014 Document Reviewed: 07/15/2013 Elsevier Interactive Patient Education 2016 Elsevier Inc.  

## 2015-10-18 NOTE — Progress Notes (Signed)
Impression and Recommendations:    1. Spinal stenosis of lumbar region   2. Spinal stenosis in cervical region   3. Anxiety   4. Clinical depression   5. Hypertriglyceridemia   6. HLD (hyperlipidemia)   7. Morbid obesity due to excess calories ([place])   8. Low HDL (under 40)    - Risk benefits of Neurontin discussed with patient. She opted for treatment at this time. Gave her very specific instructions of how to slowly taper up dose over time. Handouts provided. Follow up with your neurosurgeon Dr. Ellene Route in October as planned. Red flag symptoms discussed. -Encouraged to walk on the padded ground of the [employer] indoor track and not lay around\ sit around as this will make back pain worse. -Continue Cymbalta- mood is much improved. - Congratulated on 7 pound weight loss since last OV. Discussed with patient future goals of 1 to 2 pounds per week weight loss anticipated -All questions were answered. Continue all current medications.      Patient's Medications  New Prescriptions   GABAPENTIN (NEURONTIN) 100 MG CAPSULE    Take one tab qhs for 3 days, then twice daily for 3 d,, then 1 tab tid  Previous Medications   AMLODIPINE (NORVASC) 5 MG TABLET    Take 5 mg by mouth daily before breakfast.    ASPIRIN 81 MG CHEWABLE TABLET    Chew 81 mg by mouth daily.   CALCIUM CARBONATE (TUMS EX) 750 MG CHEWABLE TABLET    Chew 2 tablets by mouth daily as needed for heartburn.   CHOLECALCIFEROL (VITAMIN D) 1000 UNITS TABLET    Take 5,000 Units by mouth daily.   DULOXETINE (CYMBALTA) 60 MG CAPSULE    Take 2 capsules (120 mg total) by mouth daily before breakfast.   FLUTICASONE (FLONASE) 50 MCG/ACT NASAL SPRAY    Place 2 sprays into the nose daily.   LOSARTAN-HYDROCHLOROTHIAZIDE (HYZAAR) 100-25 MG PER TABLET    Take 1 tablet by mouth daily before breakfast.    MINERAL OIL-HYDROPHILIC PETROLATUM (AQUAPHOR) OINTMENT    Apply 1 application topically as needed for dry skin.   MONTELUKAST (SINGULAIR) 10  MG TABLET    Take 10 mg by mouth daily before breakfast.    NIACIN (NIASPAN) 500 MG CR TABLET    Take 1 tablet by mouth daily.   OMEGA-3 ACID ETHYL ESTERS (LOVAZA) 1 G CAPSULE    Take 1 g by mouth 2 (two) times daily. Reported on 08/30/2015   OXYBUTYNIN (DITROPAN-XL) 10 MG 24 HR TABLET    Take 1 tablet by mouth daily.   SIMVASTATIN (ZOCOR) 40 MG TABLET    Take 1 tablet (40 mg total) by mouth at bedtime.   TRAZODONE (DESYREL) 100 MG TABLET    Take 1 tablet (100 mg total) by mouth at bedtime. Reported on 08/30/2015  Modified Medications   No medications on file  Discontinued Medications   ASPIRIN EC 325 MG TABLET    Take 1 tablet (325 mg total) by mouth 2 (two) times daily.   NIACIN (NIASPAN) 1000 MG CR TABLET    One half a tablet nightly before bed    Return in about 4 weeks (around 11/15/2015).For follow-up of starting Neurontin.  The patient was counseled, risk factors were discussed, anticipatory guidance given.  Gross side effects, risk and benefits, and alternatives of medications discussed with patient.  Patient is aware that all medications have potential side effects and we are unable to predict every side effect  or drug-drug interaction that may occur.  Expresses verbal understanding and consents to current therapy plan and treatment regimen.  Please see AVS handed out to patient at the end of our visit for further patient instructions/ counseling done pertaining to today's office visit.    Note: This document was prepared using Dragon voice recognition software and may include unintentional dictation errors.   --------------------------------------------------------------------------------------------------------------------------------------------------------------------------------------------------------------------------------------------    Subjective:    CC:  Chief Complaint  Patient presents with  . Follow-up    increased dosage of simvastatin    HPI: Shelly Hickman is a 75 y.o. female who presents to [place] at [facility] today for issues as discussed below- Last office visit we reviewed her fasting labs and increased her statin medication and started her on niacin.  We also had her restart her Cymbalta she used to be on due to mood.     tol inc in statin dose well. Little change in diet  Started niacin- no problems with tolerance- 500 mg CR daily  Is moving more, walking 35min daily now  Drinking more water  Taking vit D supp w/o diff  cymbalta dose same- doing well. Feels happier and things bother her less; better sleep  Pre-dm:  Hasn;t checked sugars yet. Plans to in future.  Lost 7 lbs since last OV!!  She is very proud of this, as am I of her  CC:  back pain- after recent travel to [place] to see her daughter and son-in-law.  She had to walk up a bunch of stairs to sleep on the second floor at their house and with history of spinal stenosis, she now has worsening of her chronic symptoms of peripheral neuropathy/ neuralgia.  No new symptoms and may have gone down some since she's been home and resting. She has no stairs in her home.  Right arm\hand and also left hip and lateral thigh - numbness and tingling and burning pain.   NSx- she called yesterday for appt--> can't see til Oct.  she is wondering if I can help her.  Per patient, she said she was on Lyrica in the past but it was too expensive.       Wt Readings from Last 3 Encounters:  10/18/15 220 lb 8 oz (100 kg)  09/06/15 227 lb (103 kg)  08/30/15 223 lb 11.2 oz (101.5 kg)   BP Readings from Last 3 Encounters:  10/18/15 107/72  09/06/15 120/82  08/30/15 136/82   Pulse Readings from Last 3 Encounters:  10/18/15 91  09/06/15 77  08/30/15 92     Patient Active Problem List   Diagnosis Date Noted  . Morbid obesity ([place]) 09/09/2015    Priority: High  . Prediabetes 09/06/2015    Priority: High  . Hypertriglyceridemia 09/06/2015    Priority: High  . HLD  (hyperlipidemia) 08/30/2015    Priority: High  . Essential hypertension, benign 07/14/2013    Priority: High  . Anxiety 07/14/2013    Priority: High  . Insomnia 07/14/2013    Priority: High  . Clinical depression 03/26/2011    Priority: High  . Low HDL (under 40) 09/06/2015    Priority: Medium  . Incontinence of urine 09/05/2013    Priority: Medium  . GERD (gastroesophageal reflux disease) 12/10/2012    Priority: Medium  . Spinal stenosis in cervical region 10/18/2015  . Spinal stenosis of lumbar region 10/18/2015  . Memory impairment 08/30/2015  . Vitamin D deficiency 08/30/2015  . Status post  left hip replacement 07/14/2013  . Constipation 07/14/2013  . Allergic rhinitis 07/14/2013  . Osteopenia 06/27/2011  . 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 Degenerative joint disease involving multiple joints 04/06/2010    Past Medical history, Surgical history, Family history, Social history, Allergies and Medications have been entered into the medical record, reviewed and changed as needed.   Allergies:  Allergies  Allergen Reactions  . Avelox [Moxifloxacin Hcl In Nacl] Other (See Comments)    Chest pain  . Cephalosporins Hives    Review of Systems: No fever/ chills, night sweats, no unintended weight loss, No chest pain, or increased shortness of breath. No N/V/D.  Pertinent positives and negatives noted in HPI above    Objective:   Blood pressure 107/72, pulse 91, height 5' 5.5" (1.664 m), weight 220 lb 8 oz (100 kg). Body mass index is 36.14 kg/m. General: Well Developed, well nourished, appropriate for stated age.  Neuro: Alert and oriented x3, extra-ocular muscles intact, sensation grossly intact, Reflexes equal bilateral L4 S1  HEENT: Normocephalic, atraumatic, neck supple   Skin: Warm and dry, no gross rash. Cardiac: RRR, S1 S2,  no murmurs rubs or gallops.  Respiratory: ECTA B/L, Not using accessory muscles, speaking in full sentences-unlabored. Vascular:  No  gross lower ext edema, cap RF less 2 sec. Psych: No HI/SI, judgement and insight good, Euthymic mood. Full Affect.

## 2015-11-07 DIAGNOSIS — G5601 Carpal tunnel syndrome, right upper limb: Secondary | ICD-10-CM | POA: Diagnosis not present

## 2015-11-07 DIAGNOSIS — M7062 Trochanteric bursitis, left hip: Secondary | ICD-10-CM | POA: Diagnosis not present

## 2015-11-07 DIAGNOSIS — M7551 Bursitis of right shoulder: Secondary | ICD-10-CM | POA: Diagnosis not present

## 2015-11-07 DIAGNOSIS — G5602 Carpal tunnel syndrome, left upper limb: Secondary | ICD-10-CM | POA: Diagnosis not present

## 2015-11-15 ENCOUNTER — Other Ambulatory Visit: Payer: Self-pay | Admitting: *Deleted

## 2015-11-15 DIAGNOSIS — G5601 Carpal tunnel syndrome, right upper limb: Secondary | ICD-10-CM

## 2015-11-15 NOTE — Progress Notes (Unsigned)
emg 

## 2015-11-17 ENCOUNTER — Encounter: Payer: Self-pay | Admitting: Family Medicine

## 2015-11-17 ENCOUNTER — Ambulatory Visit (INDEPENDENT_AMBULATORY_CARE_PROVIDER_SITE_OTHER): Payer: PPO | Admitting: Family Medicine

## 2015-11-17 VITALS — Ht 65.5 in | Wt 212.8 lb

## 2015-11-17 DIAGNOSIS — Z23 Encounter for immunization: Secondary | ICD-10-CM

## 2015-11-17 DIAGNOSIS — B070 Plantar wart: Secondary | ICD-10-CM | POA: Diagnosis not present

## 2015-11-17 DIAGNOSIS — F5101 Primary insomnia: Secondary | ICD-10-CM | POA: Diagnosis not present

## 2015-11-17 DIAGNOSIS — E781 Pure hyperglyceridemia: Secondary | ICD-10-CM

## 2015-11-17 DIAGNOSIS — M48061 Spinal stenosis, lumbar region without neurogenic claudication: Secondary | ICD-10-CM

## 2015-11-17 DIAGNOSIS — M159 Polyosteoarthritis, unspecified: Secondary | ICD-10-CM

## 2015-11-17 DIAGNOSIS — R7303 Prediabetes: Secondary | ICD-10-CM

## 2015-11-17 MED ORDER — TRAZODONE HCL 100 MG PO TABS: 100.0000 mg | ORAL_TABLET | Freq: Every day | ORAL | 0 refills | Status: DC

## 2015-11-17 MED ORDER — OMEGA-3-ACID ETHYL ESTERS 1 G PO CAPS: ORAL_CAPSULE | ORAL | 1 refills | Status: DC

## 2015-11-17 MED ORDER — GABAPENTIN 300 MG PO CAPS: 300.0000 mg | ORAL_CAPSULE | Freq: Three times a day (TID) | ORAL | 0 refills | Status: DC

## 2015-11-17 NOTE — Patient Instructions (Signed)
With the 100 mg tablets of Neurontin that she would have still, please slowly bump up that dose of Neurontin like we did in the past and only increased by 100 mg every 3-4 days as tolerated

## 2015-11-17 NOTE — Progress Notes (Signed)
Subjective:    HPI: Shelly Hickman is a 75 y.o. female who presents to [place] at [facility] today for Follow-up office visit after starting Neurontin.  Back Pain:  - Follow-up of her back pain for spinal stenosis of the lumbar region- I started patient on gabapentin last office visit.    Tolerating well.  Back Pain- a lot better.   About 50% improved.    She is very pleased at how the medicines are working for her.  She has a follow-up office visit with her neurosurgeon Dr. Ellene Route in October 12th  Sleep:   Needs RF of her trazadone-  Works great and can't sleep w/o it. Ran out and finally fell alseep at 5:20am.  Feels exhausted.    IMMobility:  Encouraged patient last office visit to walk on the padded ground of the [employer] indoor track and not lay around\ sit around as this will make back pain worse. She has not done this  Obesity: [facility] diet--> started in August---> 15 lbs wt loss thus far!!   7 pound weight loss last OV.   Patient's goal-1 to 2 pounds per week weight loss.   Anna Voytek---> Ortho doc-- txing her for L hip bursitis.      Wt Readings from Last 3 Encounters:  11/17/15 212 lb 12.8 oz (96.5 kg)  10/18/15 220 lb 8 oz (100 kg)  09/06/15 227 lb (103 kg)   BP Readings from Last 3 Encounters:  10/18/15 107/72  09/06/15 120/82  08/30/15 136/82   Pulse Readings from Last 3 Encounters:  10/18/15 91  09/06/15 77  08/30/15 92   BMI Readings from Last 3 Encounters:  11/17/15 34.87 kg/m  10/18/15 36.14 kg/m  09/06/15 37.20 kg/m     Patient Active Problem List   Diagnosis Date Noted  . Morbid obesity ([place]) 09/09/2015    Priority: High  . Prediabetes 09/06/2015    Priority: High  . Hypertriglyceridemia 09/06/2015    Priority: High  . HLD (hyperlipidemia) 08/30/2015    Priority: High  . Essential hypertension, benign 07/14/2013    Priority: High  . Anxiety 07/14/2013    Priority: High  . Insomnia 07/14/2013    Priority: High    . Clinical depression 03/26/2011    Priority: High  . Low HDL (under 40) 09/06/2015    Priority: Medium  . Incontinence of urine 09/05/2013    Priority: Medium  . GERD (gastroesophageal reflux disease) 12/10/2012    Priority: Medium  . Spinal stenosis in cervical region 10/18/2015  . Spinal stenosis of lumbar region 10/18/2015  . Memory impairment 08/30/2015  . Vitamin D deficiency 08/30/2015  . Status post left hip replacement 07/14/2013  . Constipation 07/14/2013  . Allergic rhinitis 07/14/2013  . Osteopenia 06/27/2011  . 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 Degenerative joint disease involving multiple joints 04/06/2010    Past Surgical History:  Procedure Laterality Date  . 2 Synovial Cysts removed between L4-L5    . ABDOMINAL HYSTERECTOMY    . BACK SURGERY     back fusion after 2 back surgeries   . EYE SURGERY     tube inserted for excessive tearing , tube is removed   . Right Knee Arthroscopy    . TONSILLECTOMY    . 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   Procedure: TOTAL HIP ARTHROPLASTY;  Surgeon: Kerin Salen, MD;  Location: [place];  Service: Orthopedics;  Laterality: Left;    Family History  Problem Relation Age of Onset  . Hypertension Mother   . Diabetes Mother   . Stroke Mother   . Alcoholism Father   . Alcohol abuse Father   . Depression Sister     History  Drug Use No  ,  History  Alcohol Use No  ,  History  Smoking Status  . Former Smoker  . Types: Cigarettes  . Quit date: 02/19/1995  Smokeless Tobacco  . Never Used  ,  History  Sexual Activity  . Sexual activity: Not Currently    Patient's Medications  New Prescriptions   GABAPENTIN (NEURONTIN) 300 MG CAPSULE    Take 1 capsule (300 mg total) by mouth 3 (three) times daily.  Previous Medications   AMLODIPINE (NORVASC) 5 MG TABLET    Take 5 mg by mouth daily before breakfast.    ASPIRIN 81 MG CHEWABLE TABLET    Chew 81 mg by mouth daily.   CALCIUM CARBONATE (TUMS EX) 750 MG  CHEWABLE TABLET    Chew 2 tablets by mouth daily as needed for heartburn.   CHOLECALCIFEROL (VITAMIN D) 1000 UNITS TABLET    Take 5,000 Units by mouth daily.   DULOXETINE (CYMBALTA) 60 MG CAPSULE    Take 2 capsules (120 mg total) by mouth daily before breakfast.   FLUTICASONE (CUTIVATE) 0.05 % CREAM    Apply 1 application topically daily.   FLUTICASONE (FLONASE) 50 MCG/ACT NASAL SPRAY    Place 2 sprays into the nose daily.   KETOCONAZOLE (NIZORAL) 2 % CREAM    Apply 1 application topically daily.   LOSARTAN-HYDROCHLOROTHIAZIDE (HYZAAR) 100-25 MG PER TABLET    Take 1 tablet by mouth daily before breakfast.    MINERAL OIL-HYDROPHILIC PETROLATUM (AQUAPHOR) OINTMENT    Apply 1 application topically as needed for dry skin.   MONTELUKAST (SINGULAIR) 10 MG TABLET    Take 10 mg by mouth daily before breakfast.    NIACIN (NIASPAN) 500 MG CR TABLET    Take 1 tablet by mouth daily.   OXYBUTYNIN (DITROPAN-XL) 10 MG 24 HR TABLET    Take 1 tablet by mouth daily.   SIMVASTATIN (ZOCOR) 40 MG TABLET    Take 1 tablet (40 mg total) by mouth at bedtime.  Modified Medications   Modified Medication Previous Medication   OMEGA-3 ACID ETHYL ESTERS (LOVAZA) 1 G CAPSULE omega-3 acid ethyl esters (LOVAZA) 1 G capsule      Take 2 capsules nightly before bed    Take 1 g by mouth 2 (two) times daily. Reported on 08/30/2015   TRAZODONE (DESYREL) 100 MG TABLET traZODone (DESYREL) 100 MG tablet      Take 1 tablet (100 mg total) by mouth at bedtime.    Take 1 tablet (100 mg total) by mouth at bedtime. Reported on 08/30/2015  Discontinued Medications   GABAPENTIN (NEURONTIN) 100 MG CAPSULE    Take one tab qhs for 3 days, then twice daily for 3 d,, then 1 tab tid    Avelox [moxifloxacin hcl in nacl] and Cephalosporins  Current Meds  Medication Sig  . amLODipine (NORVASC) 5 MG tablet Take 5 mg by mouth daily before breakfast.   . aspirin 81 MG chewable tablet Chew 81 mg by mouth daily.  . calcium carbonate (TUMS EX) 750 MG  chewable tablet Chew 2 tablets by mouth daily as needed for heartburn.  . cholecalciferol (VITAMIN D) 1000 UNITS tablet Take 5,000 Units by mouth daily.  . DULoxetine (CYMBALTA) 60 MG capsule Take 2 capsules (120  mg total) by mouth daily before breakfast.  . fluticasone (CUTIVATE) 0.05 % cream Apply 1 application topically daily.  . fluticasone (FLONASE) 50 MCG/ACT nasal spray Place 2 sprays into the nose daily.  Marland Kitchen ketoconazole (NIZORAL) 2 % cream Apply 1 application topically daily.  Marland Kitchen losartan-hydrochlorothiazide (HYZAAR) 100-25 MG per tablet Take 1 tablet by mouth daily before breakfast.   . mineral oil-hydrophilic petrolatum (AQUAPHOR) ointment Apply 1 application topically as needed for dry skin.  . montelukast (SINGULAIR) 10 MG tablet Take 10 mg by mouth daily before breakfast.   . niacin (NIASPAN) 500 MG CR tablet Take 1 tablet by mouth daily.  Marland Kitchen oxybutynin (DITROPAN-XL) 10 MG 24 hr tablet Take 1 tablet by mouth daily.  . simvastatin (ZOCOR) 40 MG tablet Take 1 tablet (40 mg total) by mouth at bedtime.  . traZODone (DESYREL) 100 MG tablet Take 1 tablet (100 mg total) by mouth at bedtime.  . [DISCONTINUED] gabapentin (NEURONTIN) 100 MG capsule Take one tab qhs for 3 days, then twice daily for 3 d,, then 1 tab tid  . [DISCONTINUED] traZODone (DESYREL) 100 MG tablet Take 1 tablet (100 mg total) by mouth at bedtime. Reported on 08/30/2015    Review of Systems:    General:  Denies fever, chills Optho/Auditory:   Denies visual changes, blurred vision Respiratory:   Denies SOB, cough, wheeze, DIB  Cardiovascular:   Denies chest pain, palpitations, painful respirations Gastrointestinal:   Denies nausea, vomiting, diarrhea.  Endocrine:     Denies new hot or cold intolerance Musculoskeletal:  Denies new joint swelling, new gait issues, or new unexplained myalgias/ arthralgias Skin:  Denies rash, suspicious lesions  Neurological:    Denies dizziness, new unexplained weakness or numbness    Psychiatric/Behavioral:   Denies mood changes  Objective:  Height 5' 5.5" (1.664 m), weight 212 lb 12.8 oz (96.5 kg). Body mass index is 34.87 kg/m.  General: Well Developed, well nourished, and in no acute distress.  HEENT: Normocephalic, atraumatic Skin: Warm and dry, cap RF less 2 sec, good turgor Respiratory: speaking in full sentences, no conversational dyspnea NeuroM-Sk: Ambulates w/o assistance, moves * 4 Psych: A and O *3    Impression and Recommendations:    1. Spinal stenosis of lumbar region, unspecified whether neurogenic claudication present   2. Osteoarthritis of multiple joints, unspecified osteoarthritis type   3. Morbid obesity ([place])   4. Primary insomnia   5. Prediabetes   6. Need for prophylactic vaccination and inoculation against influenza   7. Hypertriglyceridemia     1. Neurontin-  we will bump up the dose and she has tolerated it well and is getting good relief of her pain. She voiced complete understanding of how to slowly ramp up the dose by 100 mg every 3 days or so. 300 mg capsule prescription given 2. Try to walk\improve mobility 3. Excellent weight loss explanation patient commended on her efforts and strongly encouraged to continue.  Told her I was here for her to help in any way I could. 4. Refill trazodone 5. We'll check A1c 4 months after last resulted 6. Flu vaccine given 7. Lovaza prescription    Orders Placed This Encounter  Procedures  . Flu vaccine HIGH DOSE PF (Fluzone High dose)    New Prescriptions   GABAPENTIN (NEURONTIN) 300 MG CAPSULE    Take 1 capsule (300 mg total) by mouth 3 (three) times daily.    Modified Medications   Modified Medication Previous Medication   OMEGA-3 ACID ETHYL ESTERS (  LOVAZA) 1 G CAPSULE omega-3 acid ethyl esters (LOVAZA) 1 G capsule      Take 2 capsules nightly before bed    Take 1 g by mouth 2 (two) times daily. Reported on 08/30/2015   TRAZODONE (DESYREL) 100 MG TABLET traZODone (DESYREL) 100 MG  tablet      Take 1 tablet (100 mg total) by mouth at bedtime.    Take 1 tablet (100 mg total) by mouth at bedtime. Reported on 08/30/2015    Discontinued Medications   GABAPENTIN (NEURONTIN) 100 MG CAPSULE    Take one tab qhs for 3 days, then twice daily for 3 d,, then 1 tab tid    The patient was counseled, risk factors were discussed, anticipatory guidance given.  Gross side effects, risk and benefits, and alternatives of medications discussed with patient.  Patient is aware that all medications have potential side effects and we are unable to predict every side effect or drug-drug interaction that may occur.  Expresses verbal understanding and consents to current therapy plan and treatment regimen.  Return in about 4 weeks (around 12/15/2015) for Weight loss and increasing dose Neurontin.  Please see AVS handed out to patient at the end of our visit for further patient instructions/ counseling done pertaining to today's office visit.      Note: This document was prepared using Dragon voice recognition software and may include unintentional dictation errors.

## 2015-11-20 ENCOUNTER — Other Ambulatory Visit: Payer: Self-pay

## 2015-11-20 MED ORDER — DULOXETINE HCL 60 MG PO CPEP: 120.0000 mg | ORAL_CAPSULE | Freq: Every day | ORAL | 1 refills | Status: DC

## 2015-11-28 ENCOUNTER — Ambulatory Visit (INDEPENDENT_AMBULATORY_CARE_PROVIDER_SITE_OTHER): Payer: PPO | Admitting: Neurology

## 2015-11-28 DIAGNOSIS — G5601 Carpal tunnel syndrome, right upper limb: Secondary | ICD-10-CM

## 2015-11-28 NOTE — Procedures (Addendum)
[facility]  [address]  [place], [place] [address] Tel: [phone] Fax:  [phone] Test Date:  11/28/2015  Patient: Shelly Hickman DOB: 05/25/1940 Physician: Narda Amber, DO  Sex: Female Height: 5\' 6"  Ref Phys: Almedia Balls, M.D.  ID#: [id number] Temp: 33.0C Technician: Jerilynn Mages. Dean   Patient Complaints: This is a 75 year old female referred for evaluation of right hand paresthesias and thenar muscle atrophy.  NCV & EMG Findings: Extensive electrodiagnostic testing of the right upper extremity shows: 1. Right median sensory response is absent. Right radial ulnar sensory responses are within normal limits. 2. Right median motor response shows severely prolonged latency (9.2 ms) and reduced amplitude (0.8 mV).  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  Right ulnar motor responses within normal limits. 3. Active on chronic motor axon loss changes are isolated to the right abductor pollicis brevis muscle.    Impression: 1. Right median neuropathy at or distal to the wrist, consistent with clinical diagnosis of carpal tunnel syndrome. Overall, these findings are very severe in degree electrically and show signs of active ongoing denervation. 2. Incidentally, there is a right Martin-Gruber anastomosis, a normal variant.   ___________________________ Narda Amber, DO    Nerve Conduction Studies Anti Sensory Summary Table   Stim Site NR Peak (ms) Norm Peak (ms) P-T Amp (V) Norm P-T Amp  Right Median Anti Sensory (2nd Digit)  33C  Wrist NR  <3.8  >10  Right Radial Anti Sensory (Base 1st Digit)  33C  Wrist    2.0 <2.8 38.6 >10  Right Ulnar Anti Sensory (5th Digit)  33C  Wrist    2.8 <3.2 10.8 >5   Motor Summary Table   Stim Site NR Onset (ms) Norm Onset (ms) O-P Amp (mV) Norm O-P Amp Site1 Site2 Delta-0 (ms) Dist (cm) Vel (m/s) Norm Vel (m/s)  Right Median Motor (Abd Poll Brev)  33C  Wrist    9.2 <4.0 0.8 >5 Elbow Wrist 3.1 24.0 77 >50  Elbow    12.3  1.0  Ulnar-wrist crossover Elbow 8.2 0.0    Ulnar-wrist crossover    4.1  3.5         Right Ulnar Motor (Abd Dig Minimi)  33C  Wrist    3.0 <3.1 8.8 >7 B Elbow Wrist 3.2 17.0 53 >50  B Elbow    6.2  7.6  A Elbow B Elbow 1.6 10.0 63 >50  A Elbow    7.8  7.5          EMG   Side Muscle Ins Act Fibs Psw Fasc Number Recrt Dur Dur. Amp Amp. Poly Poly. Comment  Right 1stDorInt Nml Nml Nml Nml Nml Nml Nml Nml Nml Nml Nml Nml N/A  Right Abd Poll Brev Nml 2+ Nml Nml 2- Rapid Some 1+ Some 1+ Nml Nml ATR  Right Ext Indicis Nml Nml Nml Nml Nml Nml Nml Nml Nml Nml Nml Nml N/A  Right PronatorTeres Nml Nml Nml Nml Nml Nml Nml Nml Nml Nml Nml Nml N/A  Right Biceps Nml Nml Nml Nml Nml Nml Nml Nml Nml Nml Nml Nml N/A  Right Triceps Nml Nml Nml Nml Nml Nml Nml Nml Nml Nml Nml Nml N/A  Right Deltoid Nml Nml Nml Nml Nml Nml Nml Nml Nml Nml Nml  Nml N/A      Waveforms:

## 2015-11-30 DIAGNOSIS — M5410 Radiculopathy, site unspecified: Secondary | ICD-10-CM | POA: Diagnosis not present

## 2015-12-06 DIAGNOSIS — G5601 Carpal tunnel syndrome, right upper limb: Secondary | ICD-10-CM | POA: Diagnosis not present

## 2015-12-06 DIAGNOSIS — M1811 Unilateral primary osteoarthritis of first carpometacarpal joint, right hand: Secondary | ICD-10-CM | POA: Diagnosis not present

## 2015-12-08 DIAGNOSIS — M810 Age-related osteoporosis without current pathological fracture: Secondary | ICD-10-CM | POA: Diagnosis not present

## 2015-12-08 DIAGNOSIS — Z1231 Encounter for screening mammogram for malignant neoplasm of breast: Secondary | ICD-10-CM | POA: Diagnosis not present

## 2015-12-08 LAB — HM DEXA SCAN

## 2015-12-08 LAB — HM MAMMOGRAPHY

## 2015-12-12 ENCOUNTER — Encounter: Payer: Self-pay | Admitting: Family Medicine

## 2015-12-12 ENCOUNTER — Ambulatory Visit (INDEPENDENT_AMBULATORY_CARE_PROVIDER_SITE_OTHER): Payer: PPO | Admitting: Family Medicine

## 2015-12-12 VITALS — BP 133/78 | HR 91 | Ht 65.5 in | Wt 209.3 lb

## 2015-12-12 DIAGNOSIS — M1711 Unilateral primary osteoarthritis, right knee: Secondary | ICD-10-CM | POA: Insufficient documentation

## 2015-12-12 DIAGNOSIS — M4802 Spinal stenosis, cervical region: Secondary | ICD-10-CM | POA: Diagnosis not present

## 2015-12-12 DIAGNOSIS — M1710 Unilateral primary osteoarthritis, unspecified knee: Secondary | ICD-10-CM

## 2015-12-12 DIAGNOSIS — M179 Osteoarthritis of knee, unspecified: Secondary | ICD-10-CM

## 2015-12-12 DIAGNOSIS — M159 Polyosteoarthritis, unspecified: Secondary | ICD-10-CM

## 2015-12-12 DIAGNOSIS — M48061 Spinal stenosis, lumbar region without neurogenic claudication: Secondary | ICD-10-CM

## 2015-12-12 DIAGNOSIS — F5102 Adjustment insomnia: Secondary | ICD-10-CM

## 2015-12-12 NOTE — Patient Instructions (Addendum)
Since you're having to take 2 of the 800 mg ibuprofens at night, you know this is bad to do. Please call your orthopedic surgeon about pain medicines. If he does not respond with anything today, please give [place] a call back and we will prescribe some for you.   Please take your 300 mg Neurontin and increase the nightly dose to 600 mg and do that for 3-4 days and if well tolerated then pushed the afternoon dose to 600 for 3-4 days etc. Patient knows every 3 or 4 days if well tolerated, increase that dose to eventually get to 900 mg 3 times a day with the goal of pain relief.    If next time at follow-up visit you are still having some night pain, we can consider adding amitriptyline to your regimen in addition to the Neurontin and Cymbalta.

## 2015-12-12 NOTE — Progress Notes (Signed)
Impression and Recommendations:    1. Osteoarthritis of multiple joints, unspecified osteoarthritis type   2. Spinal stenosis in cervical region   3. Spinal stenosis of lumbar region, unspecified whether neurogenic claudication present   4. Osteoarthritis of knee, unspecified laterality, unspecified osteoarthritis type   5. Morbid obesity ([place])   6. Adjustment insomnia     chronic pain:    Since you're having to take 2 of the 800 mg ibuprofens at night, you know this is bad to do.  Please call your orthopedic surgeon about pain medicines.   If he does not respond with anything today, please give [place] a call back and we will prescribe some for you.    since tolerating well , titrate Neurontin up:     Please take your 300 mg Neurontin and increase the nightly dose to 600 mg and do that for 3-4 days and if well tolerated then pushed the afternoon dose to 600 for 3-4 days etc. Patient knows every 3 or 4 days if well tolerated, increase that dose to eventually get to 900 mg 3 times a day with the goal of pain relief. She understands if she reaches good pain relief at a smaller dose, do not titrate up.   morbid obesity:    Continue your slow and steady weight loss. Great job.   follow-up with your multiple orthopedists and neurosurgeon regarding various areas of pain and joint compromise.  If next time at follow-up visit you are still having some night pain, we can consider adding amitriptyline to your regimen in addition to the Neurontin and Cymbalta.  Continue trazodone for sleep    extensive health counseling done today  Pt was in the office today for 40+ minutes, with over 50% time spent in face to face counseling of various medical concerns and in coordination of care  The patient was counseled, risk factors were discussed, anticipatory guidance given.  Gross side effects, risk and benefits, and alternatives of medications and treatment plan in general discussed with patient.  Patient  is aware that all medications have potential side effects and we are unable to predict every side effect or drug-drug interaction that may occur.   Patient will call with any questions prior to using medication if they have concerns.  Expresses verbal understanding and consents to current therapy and treatment regimen.  No barriers to understanding were identified.  Red flag symptoms and signs discussed in detail.  Patient expressed understanding regarding what to do in case of emergency\urgent symptoms  Return if symptoms worsen or fail to improve. patient  already has a follow-up appointment  with me  4 CPE on 11 \\9 \17.    Note: This document was prepared using Dragon voice recognition software and may include unintentional dictation errors.   --------------------------------------------------------------------------------------------------------------------------------------------------------------------------------------------------------------------------------------------  Subjective:    CC:  Chief Complaint  Patient presents with  . Weight Loss    HPI: Shelly Hickman is a 75 y.o. female who presents to [place] at [facility] today for issues as discussed below.   Dr [facility]---> Doing R carpal tunnel surgery in 4 days.-  She is having a lot of right  Wrist pain and cannot sleep well at night.  Dr Ulyses Southward- did L hip and she  wanted to go to the same Ortho group   Patient wanted ell me she had her Bone density Friday- no results yet  also, recently had her Mammo done- WNL's  Having a  lot of pain- saw Neurosx recently  About her spinal stenosis upper L spine. and he told her to get R knee done-  That he thought a lot of her pain was coming from her right knee and not her back.      Which patient is findng it difficult to sleep at night because oas well -due to her back pain  1)      She is actually following up with me today because last office  visit we increased her Neurontin due to pain and she was also supposed 2 talk 2 her neurosurgeon about that. She never did.   I started patient on Nerontin the end of August for this pain. She is doing well on and tolerating it well. Still having night pain especially  2)   Here for follow-up of her Obesity as well: [facility] diet--> started in August---> 15 lbs wt loss thus far!!   7 pound weight loss last OV.   Patient's goal-1to 2 pounds per week weight loss.  It's been 3 weeks and she actually lost 3 pounds. Great job!   Wt Readings from Last 3 Encounters:  12/12/15 209 lb 4.8 oz (94.9 kg)  11/17/15 212 lb 12.8 oz (96.5 kg)  10/18/15 220 lb 8 oz (100 kg)   BP Readings from Last 3 Encounters:  12/12/15 133/78  10/18/15 107/72  09/06/15 120/82   Pulse Readings from Last 3 Encounters:  12/12/15 91  10/18/15 91  09/06/15 77   BMI Readings from Last 3 Encounters:  12/12/15 34.30 kg/m  11/17/15 34.87 kg/m  10/18/15 36.14 kg/m     Patient Care Team    Relationship Specialty Notifications Start End  Mellody Dance, DO PCP - General Family Medicine  08/30/15   Almedia Balls, MD Consulting Physician Orthopedic Surgery  10/18/15   Kristeen Miss, MD Consulting Physician Neurosurgery  10/18/15    Comment: spine sx  Allyn Kenner, MD Consulting Physician Dermatology  10/18/15    Comment: skin screening     Patient Active Problem List   Diagnosis Date Noted  . Morbid obesity ([place]) 09/09/2015    Priority: High  . Prediabetes 09/06/2015    Priority: High  . Hypertriglyceridemia 09/06/2015    Priority: High  . HLD (hyperlipidemia) 08/30/2015    Priority: High  . Essential hypertension, benign 07/14/2013    Priority: High  . Anxiety 07/14/2013    Priority: High  . Insomnia 07/14/2013    Priority: High  . Clinical depression 03/26/2011    Priority: High  . Low HDL (under 40) 09/06/2015    Priority: Medium  . Incontinence of urine 09/05/2013    Priority: Medium  . GERD  (gastroesophageal reflux disease) 12/10/2012    Priority: Medium  . OA (osteoarthritis) of knees/ back  12/12/2015  . Spinal stenosis in cervical region 10/18/2015  . Spinal stenosis of lumbar region 10/18/2015  . Memory impairment 08/30/2015  . Vitamin D deficiency 08/30/2015  . Status post left hip replacement 07/14/2013  . Constipation 07/14/2013  . Allergic rhinitis 07/14/2013  . Osteopenia 06/27/2011  . 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 Degenerative joint disease involving multiple joints 04/06/2010    Past Medical history, Surgical history, Family history, Social history, Allergies and Medications have been entered into the medical record, reviewed and changed as needed.   Allergies:  Allergies  Allergen Reactions  . Avelox [Moxifloxacin Hcl In Nacl] Other (See Comments)    Chest pain  . Cephalosporins Hives  Review of Systems  Constitutional: Negative.  Negative for chills, diaphoresis, fever, malaise/fatigue and weight loss.  HENT: Negative.  Negative for congestion, sore throat and tinnitus.   Eyes: Negative.  Negative for blurred vision, double vision and photophobia.  Respiratory: Negative.  Negative for cough and wheezing.   Cardiovascular: Negative.  Negative for chest pain and palpitations.  Gastrointestinal: Negative.  Negative for blood in stool, diarrhea, nausea and vomiting.  Genitourinary: Negative.  Negative for dysuria, frequency and urgency.  Musculoskeletal: Positive for back pain and joint pain. Negative for myalgias.       Right wrist, scheduled for surgery with ortho on 12/15/15 Bilateral knee pain, seeing neurosurgeon for back pain  Skin: Negative.  Negative for itching and rash.  Neurological: Negative.  Negative for dizziness, focal weakness, weakness and headaches.  Endo/Heme/Allergies: Negative.  Negative for environmental allergies and polydipsia. Does not bruise/bleed easily.  Psychiatric/Behavioral: Negative.  Negative for  depression and memory loss. The patient is not nervous/anxious and does not have insomnia.     Objective:   Blood pressure 133/78, pulse 91, height 5' 5.5" (1.664 m), weight 209 lb 4.8 oz (94.9 kg). Body mass index is 34.3 kg/m. General: Well Developed, well nourished, appropriate for stated age.  Neuro: Alert and oriented x3, extra-ocular muscles intact, sensation grossly intact.  HEENT: Normocephalic, atraumatic, neck supple   Skin: Warm and dry, no gross rash. Cardiac: RRR, S1 S2,  no murmurs rubs or gallops.  Respiratory: ECTA B/L, Not using accessory muscles, speaking in full sentences-unlabored. Vascular:  2+ lower ext edema, cap RF less 2 sec. Psych: No HI/SI, judgement and insight good, Euthymic mood. Full Affect.

## 2015-12-15 DIAGNOSIS — G5601 Carpal tunnel syndrome, right upper limb: Secondary | ICD-10-CM | POA: Diagnosis not present

## 2015-12-25 DIAGNOSIS — G5601 Carpal tunnel syndrome, right upper limb: Secondary | ICD-10-CM | POA: Diagnosis not present

## 2015-12-28 ENCOUNTER — Ambulatory Visit (INDEPENDENT_AMBULATORY_CARE_PROVIDER_SITE_OTHER): Payer: PPO | Admitting: Family Medicine

## 2015-12-28 ENCOUNTER — Encounter: Payer: Self-pay | Admitting: Family Medicine

## 2015-12-28 VITALS — BP 98/67 | HR 80 | Ht 65.25 in | Wt 211.1 lb

## 2015-12-28 DIAGNOSIS — R7303 Prediabetes: Secondary | ICD-10-CM

## 2015-12-28 DIAGNOSIS — Z7189 Other specified counseling: Secondary | ICD-10-CM

## 2015-12-28 DIAGNOSIS — Z0000 Encounter for general adult medical examination without abnormal findings: Secondary | ICD-10-CM

## 2015-12-28 DIAGNOSIS — N899 Noninflammatory disorder of vagina, unspecified: Secondary | ICD-10-CM

## 2015-12-28 DIAGNOSIS — Z23 Encounter for immunization: Secondary | ICD-10-CM | POA: Diagnosis not present

## 2015-12-28 LAB — POCT GLYCOSYLATED HEMOGLOBIN (HGB A1C): Hemoglobin A1C: 6

## 2015-12-28 LAB — POCT UA - MICROALBUMIN
Albumin/Creatinine Ratio, Urine, POC: <30
Creatinine, POC: 300 mg/dL
Microalbumin Ur, POC: 30 mg/L

## 2015-12-28 NOTE — Progress Notes (Signed)
Impression and Recommendations:    1. Encounter for wellness examination   2. Counseling on health promotion and disease prevention   3. Disorder of vagina- Malodor of Vagina   4. Morbid obesity ([place])   5. Prediabetes   6. Need for Tdap vaccination    Morbid obesity ([place]) Counseling done- advised wt loss.   Discussed with patient importance of weight loss to help achieve health goals and how increasing weight, correlates to increasing risk of disease. (or increasing risk of not controlling existing diseases.)  Prediabetes Extensive counseling on disease process and treatment plan of lifestyle modifications. Recheck A1c 4 months  Disorder of vagina- Malodor of Vagina reassured pt no rash or D/C of vaginal region.    Cleaning and care d/c pt extensively; handouts on PH neutral vagina wipes to use when she goes to the bathroom prn  Please see orders section below for further details of actions taken during this office visit.  Gross side effects, risk and benefits, and alternatives of medications discussed with patient.  Patient is aware that all medications have potential side effects and we are unable to predict every side effect or drug-drug interaction that may occur.  Expresses verbal understanding and consents to current therapy plan and treatment regiment.  1) Anticipatory Guidance: Discussed importance of wearing a seatbelt while driving, not texting while driving; sunscreen when outside along with yearly skin surveillance; eating a well balanced and modest diet; physical activity at least 25 minutes per day or 150 min/ week of moderate to intense activity.  2) Immunizations / Screenings / Labs:  All immunizations and screenings that patient agrees to, are up-to-date per recommendations or will be updated today.  Patient understands the needs for q 88mo dental and yearly vision screens which pt will schedule independently.   3) Weight:   Discussed goal of losing even 5-10%  of current body weight which would improve overall feelings of well being and improve objective health data significantly.   Improve nutrient density of diet through increasing intake of fruits and vegetables and decreasing saturated/trans fats, white flour products and refined sugar products.   F-up preventative CPE in 1 year. F/up sooner for chronic care management as discussed and/or prn.  Please see orders placed and AVS handed out to patient at the end of our visit for further patient instructions/ counseling done pertaining to today's office visit.     Subjective:    Chief Complaint  Patient presents with  . Annual Exam    HPI: Shelly Hickman is a 75 y.o. female who presents to [facility] today a yearly health maintenance exam.  Health Maintenance Summary Reviewed and updated, unless pt declines services.  Alcohol:        No concerns, no excessive use Exercise Habits:     rare STD concerns:     none Drug Use:     None Birth control method:    none Menses regular:      n/a Lumps or breast concerns:      no  C/o odorous vagina, no d/c but she can't see if there is a rash or anything. No itch, just smelly   Wt Readings from Last 3 Encounters:  12/28/15 211 lb 1.6 oz (95.8 kg)  12/12/15 209 lb 4.8 oz (94.9 kg)  11/17/15 212 lb 12.8 oz (96.5 kg)   BP Readings from Last 3 Encounters:  12/28/15 98/67  12/12/15 133/78  10/18/15 107/72  Pulse Readings from Last 3 Encounters:  12/28/15 80  12/12/15 91  10/18/15 91     Past Medical History:  Diagnosis Date  . Anemia   . Anxiety   . Depression   . H/O hiatal hernia   . Hypercholesterolemia   . Incontinence of urine       Past Surgical History:  Procedure Laterality Date  . 2 Synovial Cysts removed between L4-L5    . ABDOMINAL HYSTERECTOMY    . BACK SURGERY     back fusion after 2 back surgeries   . CARPAL TUNNEL RELEASE    . EYE SURGERY     tube inserted for excessive  tearing , tube is removed   . Right Knee Arthroscopy    . TONSILLECTOMY    . 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 Hypertension Mother   . Diabetes Mother   . Stroke Mother   . Alcoholism Father   . Alcohol abuse Father   . Depression Sister       History  Drug Use No  ,   History  Alcohol Use No  ,   History  Smoking Status  . Former Smoker  . Types: Cigarettes  . Quit date: 02/19/1995  Smokeless Tobacco  . Never Used  ,   History  Sexual Activity  . Sexual activity: Not Currently    Current Outpatient Prescriptions on File Prior to Visit  Medication Sig Dispense Refill  . amLODipine (NORVASC) 5 MG tablet Take 5 mg by mouth daily before breakfast.     . aspirin 81 MG chewable tablet Chew 81 mg by mouth daily.    . calcium carbonate (TUMS EX) 750 MG chewable tablet Chew 2 tablets by mouth daily as needed for heartburn.    . cholecalciferol (VITAMIN D) 1000 UNITS tablet Take 5,000 Units by mouth daily.    . DULoxetine (CYMBALTA) 60 MG capsule Take 2 capsules (120 mg total) by mouth daily before breakfast. 180 capsule 1  . fluticasone (FLONASE) 50 MCG/ACT nasal spray Place 2 sprays into the nose daily.    Marland Kitchen gabapentin (NEURONTIN) 300 MG capsule Take 1 capsule (300 mg total) by mouth 3 (three) times daily. 90 capsule 0  . ibuprofen (ADVIL,MOTRIN) 800 MG tablet Take 800 mg by mouth at bedtime as needed.    Marland Kitchen losartan-hydrochlorothiazide (HYZAAR) 100-25 MG per tablet Take 1 tablet by mouth daily before breakfast.     . mineral oil-hydrophilic petrolatum (AQUAPHOR) ointment Apply 1 application topically as needed for dry skin.    . montelukast (SINGULAIR) 10 MG tablet Take 10 mg by mouth daily before breakfast.     . niacin (NIASPAN) 500 MG CR tablet Take 1 tablet by mouth daily.    Marland Kitchen  omega-3 acid ethyl esters (LOVAZA) 1 g capsule Take 2 capsules nightly before bed 180 capsule 1  . oxybutynin (DITROPAN-XL) 10 MG 24 hr tablet Take 1 tablet by mouth daily.    . simvastatin (ZOCOR) 40 MG tablet Take 1 tablet (40 mg total) by mouth at bedtime. 90 tablet 3  . traZODone (DESYREL) 100 MG tablet Take 1 tablet (100 mg total) by mouth at bedtime. 90 tablet 0   No current facility-administered medications on file prior to visit.     Allergies: Avelox [moxifloxacin hcl  in nacl] and Cephalosporins  Review of Systems  Constitutional: Negative.  Negative for chills, diaphoresis, fever, malaise/fatigue and weight loss.  HENT: Negative.  Negative for congestion, sore throat and tinnitus.   Eyes: Negative.  Negative for blurred vision, double vision and photophobia.  Respiratory: Negative.  Negative for cough and wheezing.   Cardiovascular: Negative.  Negative for chest pain and palpitations.  Gastrointestinal: Negative.  Negative for blood in stool, diarrhea, nausea and vomiting.  Genitourinary: Negative.  Negative for dysuria, frequency and urgency.  Musculoskeletal: Negative.  Negative for joint pain and myalgias.  Skin: Negative for itching and rash.       Odor from skin in pelvic area  Neurological: Negative.  Negative for dizziness, focal weakness, weakness and headaches.  Endo/Heme/Allergies: Negative.  Negative for environmental allergies and polydipsia. Does not bruise/bleed easily.  Psychiatric/Behavioral: Negative.  Negative for depression and memory loss. The patient is not nervous/anxious and does not have insomnia.      Objective:    Blood pressure 98/67, pulse 80, height 5' 5.25" (1.657 m), weight 211 lb 1.6 oz (95.8 kg). Body mass index is 34.86 kg/m. General Appearance:    Alert, cooperative, no distress, appears stated age  Head:    Normocephalic, without obvious abnormality, atraumatic  Eyes:    PERRL, conjunctiva/corneas clear, EOM's intact, fundi    benign,  both eyes  Ears:    Normal TM's and external ear canals, both ears  Nose:   Nares normal, septum midline, mucosa normal, no drainage    or sinus tenderness  Throat:   Lips w/o lesion, mucosa moist, and tongue normal; teeth and   gums normal  Neck:   Supple, symmetrical, trachea midline, no adenopathy;    thyroid:  no enlargement/tenderness/nodules; no carotid   bruit or JVD  Back:     Symmetric, no curvature, ROM normal, no CVA tenderness  Lungs:     Clear to auscultation bilaterally, respirations unlabored, no       Wh/ R/ R  Chest Wall:    No tenderness or gross deformity; normal excursion   Heart:    Regular rate and rhythm, S1 and S2 normal, no murmur, rub   or gallop  Breast Exam:    No tenderness, masses, or nipple abnormality b/l; no d/c  Abdomen:     Soft, non-tender, bowel sounds active all four quadrants, NO   G/R/R, no masses, no organomegaly  Genitalia:    Ext genitalia: without lesion, no rash or discharge, No         tenderness; Adnexa:  No tenderness or palpable masses   Rectal:    Deferred- stool samples given  Extremities:   Extremities normal, atraumatic, no cyanosis or gross edema  Pulses:   2+ and symmetric all extremities  Skin:   Warm, dry, Skin color, texture, turgor normal, no obvious rashes or lesions Psych: No HI/SI, judgement and insight good, Euthymic mood. Full Affect.  Neurologic:   CNII-XII intact, normal strength, sensation and reflexes    Throughout

## 2015-12-28 NOTE — Patient Instructions (Addendum)
Lotrimin powder or Zeasorb antifungal powder.    wash it with a alcohol swab every couple of days to keep it clean and dry.   Preventive Care for Adults, Female A healthy lifestyle and preventive care can promote health and wellness. Preventive health guidelines for women include the following key practices.   A routine yearly physical is a good way to check with your health care provider about your health and preventive screening. It is a chance to share any concerns and updates on your health and to receive a thorough exam.   Visit your dentist for a routine exam and preventive care every 6 months. Brush your teeth twice a day and floss once a day. Good oral hygiene prevents tooth decay and gum disease.   The frequency of eye exams is based on your age, health, family medical history, use of contact lenses, and other factors. Follow your health care provider's recommendations for frequency of eye exams.   Eat a healthy diet. Foods like vegetables, fruits, whole grains, low-fat dairy products, and lean protein foods contain the nutrients you need without too many calories. Decrease your intake of foods high in solid fats, added sugars, and salt. Eat the right amount of calories for you.Get information about a proper diet from your health care provider, if necessary.   Regular physical exercise is one of the most important things you can do for your health. Most adults should get at least 150 minutes of moderate-intensity exercise (any activity that increases your heart rate and causes you to sweat) each week. In addition, most adults need muscle-strengthening exercises on 2 or more days a week.   Maintain a healthy weight. The body mass index (BMI) is a screening tool to identify possible weight problems. It provides an estimate of body fat based on height and weight. Your health care provider can find your BMI, and can help you achieve or maintain a healthy weight.For adults 20 years and  older:   - A BMI below 18.5 is considered underweight.   - A BMI of 18.5 to 24.9 is normal.   - A BMI of 25 to 29.9 is considered overweight.   - A BMI of 30 and above is considered obese.   Maintain normal blood lipids and cholesterol levels by exercising and minimizing your intake of trans and saturated fats.  Eat a balanced diet with plenty of fruit and vegetables. Blood tests for lipids and cholesterol should begin at age 38 and be repeated every 5 years minimum.  If your lipid or cholesterol levels are high, you are over 40, or you are at high risk for heart disease, you may need your cholesterol levels checked more frequently.Ongoing high lipid and cholesterol levels should be treated with medicines if diet and exercise are not working.   If you smoke, find out from your health care provider how to quit. If you do not use tobacco, do not start.   Lung cancer screening is recommended for adults aged 25-80 years who are at high risk for developing lung cancer because of a history of smoking. A yearly low-dose CT scan of the lungs is recommended for people who have at least a 30-pack-year history of smoking and are a current smoker or have quit within the past 15 years. A pack year of smoking is smoking an average of 1 pack of cigarettes a day for 1 year (for example: 1 pack a day for 30 years or 2 packs a day for 15  years). Yearly screening should continue until the smoker has stopped smoking for at least 15 years. Yearly screening should be stopped for people who develop a health problem that would prevent them from having lung cancer treatment.   If you are pregnant, do not drink alcohol. If you are breastfeeding, be very cautious about drinking alcohol. If you are not pregnant and choose to drink alcohol, do not have more than 1 drink per day. One drink is considered to be 12 ounces (355 mL) of beer, 5 ounces (148 mL) of wine, or 1.5 ounces (44 mL) of liquor.   Avoid use of street drugs.  Do not share needles with anyone. Ask for help if you need support or instructions about stopping the use of drugs.   High blood pressure causes heart disease and increases the risk of stroke. Your blood pressure should be checked at least yearly.  Ongoing high blood pressure should be treated with medicines if weight loss and exercise do not work.   If you are 22-70 years old, ask your health care provider if you should take aspirin to prevent strokes.   Diabetes screening involves taking a blood sample to check your fasting blood sugar level. This should be done once every 3 years, after age 52, if you are within normal weight and without risk factors for diabetes. Testing should be considered at a younger age or be carried out more frequently if you are overweight and have at least 1 risk factor for diabetes.   Breast cancer screening is essential preventive care for women. You should practice "breast self-awareness."  This means understanding the normal appearance and feel of your breasts and may include breast self-examination.  Any changes detected, no matter how small, should be reported to a health care provider.  Women in their 87s and 30s should have a clinical breast exam (CBE) by a health care provider as part of a regular health exam every 1 to 3 years.  After age 102, women should have a CBE every year.  Starting at age 15, women should consider having a mammogram (breast X-ray test) every year.  Women who have a family history of breast cancer should talk to their health care provider about genetic screening.  Women at a high risk of breast cancer should talk to their health care providers about having an MRI and a mammogram every year.   -Breast cancer gene (BRCA)-related cancer risk assessment is recommended for women who have family members with BRCA-related cancers. BRCA-related cancers include breast, ovarian, tubal, and peritoneal cancers. Having family members with these cancers  may be associated with an increased risk for harmful changes (mutations) in the breast cancer genes BRCA1 and BRCA2. Results of the assessment will determine the need for genetic counseling and BRCA1 and BRCA2 testing.   The Pap test is a screening test for cervical cancer. A Pap test can show cell changes on the cervix that might become cervical cancer if left untreated. A Pap test is a procedure in which cells are obtained and examined from the lower end of the uterus (cervix).   - Women should have a Pap test starting at age 21.   - Between ages 38 and 45, Pap tests should be repeated every 2 years.   - Beginning at age 67, you should have a Pap test every 3 years as long as the past 3 Pap tests have been normal.   - Some women have medical problems that increase the chance  of getting cervical cancer. Talk to your health care provider about these problems. It is especially important to talk to your health care provider if a new problem develops soon after your last Pap test. In these cases, your health care provider may recommend more frequent screening and Pap tests.   - The above recommendations are the same for women who have or have not gotten the vaccine for human papillomavirus (HPV).   - If you had a hysterectomy for a problem that was not cancer or a condition that could lead to cancer, then you no longer need Pap tests. Even if you no longer need a Pap test, a regular exam is a good idea to make sure no other problems are starting.   - If you are between ages 71 and 46 years, and you have had normal Pap tests going back 10 years, you no longer need Pap tests. Even if you no longer need a Pap test, a regular exam is a good idea to make sure no other problems are starting.   - If you have had past treatment for cervical cancer or a condition that could lead to cancer, you need Pap tests and screening for cancer for at least 20 years after your treatment.   - If Pap tests have been  discontinued, risk factors (such as a new sexual partner) need to be reassessed to determine if screening should be resumed.   - The HPV test is an additional test that may be used for cervical cancer screening. The HPV test looks for the virus that can cause the cell changes on the cervix. The cells collected during the Pap test can be tested for HPV. The HPV test could be used to screen women aged 69 years and older, and should be used in women of any age who have unclear Pap test results. After the age of 27, women should have HPV testing at the same frequency as a Pap test.   Colorectal cancer can be detected and often prevented. Most routine colorectal cancer screening begins at the age of 43 years and continues through age 12 years. However, your health care provider may recommend screening at an earlier age if you have risk factors for colon cancer. On a yearly basis, your health care provider may provide home test kits to check for hidden blood in the stool.  Use of a small camera at the end of a tube, to directly examine the colon (sigmoidoscopy or colonoscopy), can detect the earliest forms of colorectal cancer. Talk to your health care provider about this at age 69, when routine screening begins. Direct exam of the colon should be repeated every 5 -10 years through age 13 years, unless early forms of pre-cancerous polyps or small growths are found.   People who are at an increased risk for hepatitis B should be screened for this virus. You are considered at high risk for hepatitis B if:  -You were born in a country where hepatitis B occurs often. Talk with your health care provider about which countries are considered high risk.  - Your parents were born in a high-risk country and you have not received a shot to protect against hepatitis B (hepatitis B vaccine).  - You have HIV or AIDS.  - You use needles to inject street drugs.  - You live with, or have sex with, someone who has Hepatitis  B.  - You get hemodialysis treatment.  - You take certain medicines for conditions  like cancer, organ transplantation, and autoimmune conditions.   Hepatitis C blood testing is recommended for all people born from [birth year] through 1965 and any individual with known risks for hepatitis C.   Practice safe sex. Use condoms and avoid high-risk sexual practices to reduce the spread of sexually transmitted infections (STIs). STIs include gonorrhea, chlamydia, syphilis, trichomonas, herpes, HPV, and human immunodeficiency virus (HIV). Herpes, HIV, and HPV are viral illnesses that have no cure. They can result in disability, cancer, and death. Sexually active women aged 33 years and younger should be checked for chlamydia. Older women with new or multiple partners should also be tested for chlamydia. Testing for other STIs is recommended if you are sexually active and at increased risk.   Osteoporosis is a disease in which the bones lose minerals and strength with aging. This can result in serious bone fractures or breaks. The risk of osteoporosis can be identified using a bone density scan. Women ages 23 years and over and women at risk for fractures or osteoporosis should discuss screening with their health care providers. Ask your health care provider whether you should take a calcium supplement or vitamin D to reduce the rate of osteoporosis.   Menopause can be associated with physical symptoms and risks. Hormone replacement therapy is available to decrease symptoms and risks. You should talk to your health care provider about whether hormone replacement therapy is right for you.   Use sunscreen. Apply sunscreen liberally and repeatedly throughout the day. You should seek shade when your shadow is shorter than you. Protect yourself by wearing long sleeves, pants, a wide-brimmed hat, and sunglasses year round, whenever you are outdoors.   Once a month, do a whole body skin exam, using a mirror to look at  the skin on your back. Tell your health care provider of new moles, moles that have irregular borders, moles that are larger than a pencil eraser, or moles that have changed in shape or color.   Stay current with required vaccines (immunizations).   Influenza vaccine. All adults should be immunized every year.   Tetanus, diphtheria, and acellular pertussis (Td, Tdap) vaccine. Pregnant women should receive 1 dose of Tdap vaccine during each pregnancy. The dose should be obtained regardless of the length of time since the last dose. Immunization is preferred during the 27th 36th week of gestation. An adult who has not previously received Tdap or who does not know her vaccine status should receive 1 dose of Tdap. This initial dose should be followed by tetanus and diphtheria toxoids (Td) booster doses every 10 years. Adults with an unknown or incomplete history of completing a 3-dose immunization series with Td-containing vaccines should begin or complete a primary immunization series including a Tdap dose. Adults should receive a Td booster every 10 years.   Varicella vaccine. An adult without evidence of immunity to varicella should receive 2 doses or a second dose if she has previously received 1 dose. Pregnant females who do not have evidence of immunity should receive the first dose after pregnancy. This first dose should be obtained before leaving the health care facility. The second dose should be obtained 4 8 weeks after the first dose.   Human papillomavirus (HPV) vaccine. Females aged 61 26 years who have not received the vaccine previously should obtain the 3-dose series. The vaccine is not recommended for use in pregnant females. However, pregnancy testing is not needed before receiving a dose. If a female is found to be pregnant after  receiving a dose, no treatment is needed. In that case, the remaining doses should be delayed until after the pregnancy. Immunization is recommended for any  person with an immunocompromised condition through the age of 73 years if she did not get any or all doses earlier. During the 3-dose series, the second dose should be obtained 4 8 weeks after the first dose. The third dose should be obtained 24 weeks after the first dose and 16 weeks after the second dose.   Zoster vaccine. One dose is recommended for adults aged 55 years or older unless certain conditions are present.   Measles, mumps, and rubella (MMR) vaccine. Adults born before [birth year] generally are considered immune to measles and mumps. Adults born in [birth year] or later should have 1 or more doses of MMR vaccine unless there is a contraindication to the vaccine or there is laboratory evidence of immunity to each of the three diseases. A routine second dose of MMR vaccine should be obtained at least 28 days after the first dose for students attending postsecondary schools, health care workers, or international travelers. People who received inactivated measles vaccine or an unknown type of measles vaccine during 1963 1967 should receive 2 doses of MMR vaccine. People who received inactivated mumps vaccine or an unknown type of mumps vaccine before 1979 and are at high risk for mumps infection should consider immunization with 2 doses of MMR vaccine. For females of childbearing age, rubella immunity should be determined. If there is no evidence of immunity, females who are not pregnant should be vaccinated. If there is no evidence of immunity, females who are pregnant should delay immunization until after pregnancy. Unvaccinated health care workers born before [birth year] who lack laboratory evidence of measles, mumps, or rubella immunity or laboratory confirmation of disease should consider measles and mumps immunization with 2 doses of MMR vaccine or rubella immunization with 1 dose of MMR vaccine.   Pneumococcal 13-valent conjugate (PCV13) vaccine. When indicated, a person who is uncertain of her immunization  history and has no record of immunization should receive the PCV13 vaccine. An adult aged 53 years or older who has certain medical conditions and has not been previously immunized should receive 1 dose of PCV13 vaccine. This PCV13 should be followed with a dose of pneumococcal polysaccharide (PPSV23) vaccine. The PPSV23 vaccine dose should be obtained at least 8 weeks after the dose of PCV13 vaccine. An adult aged 1 years or older who has certain medical conditions and previously received 1 or more doses of PPSV23 vaccine should receive 1 dose of PCV13. The PCV13 vaccine dose should be obtained 1 or more years after the last PPSV23 vaccine dose.   Pneumococcal polysaccharide (PPSV23) vaccine. When PCV13 is also indicated, PCV13 should be obtained first. All adults aged 51 years and older should be immunized. An adult younger than age 6 years who has certain medical conditions should be immunized. Any person who resides in a nursing home or long-term care facility should be immunized. An adult smoker should be immunized. People with an immunocompromised condition and certain other conditions should receive both PCV13 and PPSV23 vaccines. People with human immunodeficiency virus (HIV) infection should be immunized as soon as possible after diagnosis. Immunization during chemotherapy or radiation therapy should be avoided. Routine use of PPSV23 vaccine is not recommended for American Indians, [place] Natives, or people younger than 65 years unless there are medical conditions that require PPSV23 vaccine. When indicated, people who have unknown immunization and have no record  of immunization should receive PPSV23 vaccine. One-time revaccination 5 years after the first dose of PPSV23 is recommended for people aged 6 64 years who have chronic kidney failure, nephrotic syndrome, asplenia, or immunocompromised conditions. People who received 1 2 doses of PPSV23 before age 77 years should receive another dose of PPSV23  vaccine at age 22 years or later if at least 5 years have passed since the previous dose. Doses of PPSV23 are not needed for people immunized with PPSV23 at or after age 79 years.   Meningococcal vaccine. Adults with asplenia or persistent complement component deficiencies should receive 2 doses of quadrivalent meningococcal conjugate (MenACWY-D) vaccine. The doses should be obtained at least 2 months apart. Microbiologists working with certain meningococcal bacteria, [employer] recruits, people at risk during an outbreak, and people who travel to or live in countries with a high rate of meningitis should be immunized. A first-year college student up through age 32 years who is living in a residence hall should receive a dose if she did not receive a dose on or after her 16th birthday. Adults who have certain high-risk conditions should receive one or more doses of vaccine.   Hepatitis A vaccine. Adults who wish to be protected from this disease, have certain high-risk conditions, work with hepatitis A-infected animals, work in hepatitis A research labs, or travel to or work in countries with a high rate of hepatitis A should be immunized. Adults who were previously unvaccinated and who anticipate close contact with an international adoptee during the first 60 days after arrival in the [place] from a country with a high rate of hepatitis A should be immunized.   Hepatitis B vaccine. Adults who wish to be protected from this disease, have certain high-risk conditions, may be exposed to blood or other infectious body fluids, are household contacts or sex partners of hepatitis B positive people, are clients or workers in certain care facilities, or travel to or work in countries with a high rate of hepatitis B should be immunized.   Haemophilus influenzae type b (Hib) vaccine. A previously unvaccinated person with asplenia or sickle cell disease or having a scheduled splenectomy should receive 1 dose of  Hib vaccine. Regardless of previous immunization, a recipient of a hematopoietic stem cell transplant should receive a 3-dose series 6 12 months after her successful transplant. Hib vaccine is not recommended for adults with HIV infection.  Preventive Services / Frequency Ages 71 to 39years  Blood pressure check.** / Every 1 to 2 years.  Lipid and cholesterol check.** / Every 5 years beginning at age 69.  Clinical breast exam.** / Every 3 years for women in their 52s and 33s.  BRCA-related cancer risk assessment.** / For women who have family members with a BRCA-related cancer (breast, ovarian, tubal, or peritoneal cancers).  Pap test.** / Every 2 years from ages 34 through 58. Every 3 years starting at age 65 through age 4 or 12 with a history of 3 consecutive normal Pap tests.  HPV screening.** / Every 3 years from ages 76 through ages 40 to 21 with a history of 3 consecutive normal Pap tests.  Hepatitis C blood test.** / For any individual with known risks for hepatitis C.  Skin self-exam. / Monthly.  Influenza vaccine. / Every year.  Tetanus, diphtheria, and acellular pertussis (Tdap, Td) vaccine.** / Consult your health care provider. Pregnant women should receive 1 dose of Tdap vaccine during each pregnancy. 1 dose of Td every 10 years.  Varicella vaccine.** / Consult your health care provider. Pregnant females who do not have evidence of immunity should receive the first dose after pregnancy.  HPV vaccine. / 3 doses over 6 months, if 31 and younger. The vaccine is not recommended for use in pregnant females. However, pregnancy testing is not needed before receiving a dose.  Measles, mumps, rubella (MMR) vaccine.** / You need at least 1 dose of MMR if you were born in 1957 or later. You may also need a 2nd dose. For females of childbearing age, rubella immunity should be determined. If there is no evidence of immunity, females who are not pregnant should be vaccinated. If there is  no evidence of immunity, females who are pregnant should delay immunization until after pregnancy.  Pneumococcal 13-valent conjugate (PCV13) vaccine.** / Consult your health care provider.  Pneumococcal polysaccharide (PPSV23) vaccine.** / 1 to 2 doses if you smoke cigarettes or if you have certain conditions.  Meningococcal vaccine.** / 1 dose if you are age 75 to 61 years and a Market researcher living in a residence hall, or have one of several medical conditions, you need to get vaccinated against meningococcal disease. You may also need additional booster doses.  Hepatitis A vaccine.** / Consult your health care provider.  Hepatitis B vaccine.** / Consult your health care provider.  Haemophilus influenzae type b (Hib) vaccine.** / Consult your health care provider.  Ages 37 to 64years  Blood pressure check.** / Every 1 to 2 years.  Lipid and cholesterol check.** / Every 5 years beginning at age 34 years.  Lung cancer screening. / Every year if you are aged 72 80 years and have a 30-pack-year history of smoking and currently smoke or have quit within the past 15 years. Yearly screening is stopped once you have quit smoking for at least 15 years or develop a health problem that would prevent you from having lung cancer treatment.  Clinical breast exam.** / Every year after age 43 years.  BRCA-related cancer risk assessment.** / For women who have family members with a BRCA-related cancer (breast, ovarian, tubal, or peritoneal cancers).  Mammogram.** / Every year beginning at age 20 years and continuing for as long as you are in good health. Consult with your health care provider.  Pap test.** / Every 3 years starting at age 76 years through age 15 or 39 years with a history of 3 consecutive normal Pap tests.  HPV screening.** / Every 3 years from ages 47 years through ages 38 to 36 years with a history of 3 consecutive normal Pap tests.  Fecal occult blood test (FOBT) of  stool. / Every year beginning at age 19 years and continuing until age 42 years. You may not need to do this test if you get a colonoscopy every 10 years.  Flexible sigmoidoscopy or colonoscopy.** / Every 5 years for a flexible sigmoidoscopy or every 10 years for a colonoscopy beginning at age 43 years and continuing until age 62 years.  Hepatitis C blood test.** / For all people born from [birth year] through 1963 and any individual with known risks for hepatitis C.  Skin self-exam. / Monthly.  Influenza vaccine. / Every year.  Tetanus, diphtheria, and acellular pertussis (Tdap/Td) vaccine.** / Consult your health care provider. Pregnant women should receive 1 dose of Tdap vaccine during each pregnancy. 1 dose of Td every 10 years.  Varicella vaccine.** / Consult your health care provider. Pregnant females who do not have evidence of immunity should receive  the first dose after pregnancy.  Zoster vaccine.** / 1 dose for adults aged 40 years or older.  Measles, mumps, rubella (MMR) vaccine.** / You need at least 1 dose of MMR if you were born in 1957 or later. You may also need a 2nd dose. For females of childbearing age, rubella immunity should be determined. If there is no evidence of immunity, females who are not pregnant should be vaccinated. If there is no evidence of immunity, females who are pregnant should delay immunization until after pregnancy.  Pneumococcal 13-valent conjugate (PCV13) vaccine.** / Consult your health care provider.  Pneumococcal polysaccharide (PPSV23) vaccine.** / 1 to 2 doses if you smoke cigarettes or if you have certain conditions.  Meningococcal vaccine.** / Consult your health care provider.  Hepatitis A vaccine.** / Consult your health care provider.  Hepatitis B vaccine.** / Consult your health care provider.  Haemophilus influenzae type b (Hib) vaccine.** / Consult your health care provider.  Ages 73 years and over  Blood pressure check.** / Every 1 to 2  years.  Lipid and cholesterol check.** / Every 5 years beginning at age 75 years.  Lung cancer screening. / Every year if you are aged 85 80 years and have a 30-pack-year history of smoking and currently smoke or have quit within the past 15 years. Yearly screening is stopped once you have quit smoking for at least 15 years or develop a health problem that would prevent you from having lung cancer treatment.  Clinical breast exam.** / Every year after age 24 years.  BRCA-related cancer risk assessment.** / For women who have family members with a BRCA-related cancer (breast, ovarian, tubal, or peritoneal cancers).  Mammogram.** / Every year beginning at age 58 years and continuing for as long as you are in good health. Consult with your health care provider.  Pap test.** / Every 3 years starting at age 58 years through age 69 or 13 years with 3 consecutive normal Pap tests. Testing can be stopped between 65 and 70 years with 3 consecutive normal Pap tests and no abnormal Pap or HPV tests in the past 10 years.  HPV screening.** / Every 3 years from ages 48 years through ages 60 or 11 years with a history of 3 consecutive normal Pap tests. Testing can be stopped between 65 and 70 years with 3 consecutive normal Pap tests and no abnormal Pap or HPV tests in the past 10 years.  Fecal occult blood test (FOBT) of stool. / Every year beginning at age 71 years and continuing until age 14 years. You may not need to do this test if you get a colonoscopy every 10 years.  Flexible sigmoidoscopy or colonoscopy.** / Every 5 years for a flexible sigmoidoscopy or every 10 years for a colonoscopy beginning at age 56 years and continuing until age 35 years.  Hepatitis C blood test.** / For all people born from [birth year] through 1963 and any individual with known risks for hepatitis C.  Osteoporosis screening.** / A one-time screening for women ages 69 years and over and women at risk for fractures or  osteoporosis.  Skin self-exam. / Monthly.  Influenza vaccine. / Every year.  Tetanus, diphtheria, and acellular pertussis (Tdap/Td) vaccine.** / 1 dose of Td every 10 years.  Varicella vaccine.** / Consult your health care provider.  Zoster vaccine.** / 1 dose for adults aged 75 years or older.  Pneumococcal 13-valent conjugate (PCV13) vaccine.** / Consult your health care provider.  Pneumococcal polysaccharide (PPSV23) vaccine.** /  1 dose for all adults aged 54 years and older.  Meningococcal vaccine.** / Consult your health care provider.  Hepatitis A vaccine.** / Consult your health care provider.  Hepatitis B vaccine.** / Consult your health care provider.  Haemophilus influenzae type b (Hib) vaccine.** / Consult your health care provider.     ** Family history and personal history of risk and conditions may change your health care provider's recommendations. Document Released: 04/02/2001 Document Revised: 11/25/2012  [facility] Patient Information 2014 [place], [place].   EXERCISE AND DIET:  We recommended that you start or continue a regular exercise program for good health. Regular exercise means any activity that makes your heart beat faster and makes you sweat.  We recommend exercising at least 30 minutes per day at least 3 days a week, preferably 5.  We also recommend a diet low in fat and sugar / carbohydrates.  Inactivity, poor dietary choices and obesity can cause diabetes, heart attack, stroke, and kidney damage, among others.     ALCOHOL AND SMOKING:  Women should limit their alcohol intake to no more than 7 drinks/beers/glasses of wine (combined, not each!) per week. Moderation of alcohol intake to this level decreases your risk of breast cancer and liver damage.  ( And of course, no recreational drugs are part of a healthy lifestyle.)  Also, you should not be smoking at all or even being exposed to second hand smoke. Most people know smoking can cause cancer, and  various heart and lung diseases, but did you know it also contributes to weakening of your bones?  Aging of your skin?  Yellowing of your teeth and nails?   CALCIUM AND VITAMIN D:  Adequate intake of calcium and Vitamin D are recommended.  The recommendations for exact amounts of these supplements seem to change often, but generally speaking 600 mg of calcium (either carbonate or citrate) and 800 units of Vitamin D per day seems prudent. Certain women may benefit from higher intake of Vitamin D.  If you are among these women, your doctor will have told you during your visit.     PAP SMEARS:  Pap smears, to check for cervical cancer or precancers,  have traditionally been done yearly, although recent scientific advances have shown that most women can have pap smears less often.  However, every woman still should have a physical exam from her gynecologist or primary care physician every year. It will include a breast check, inspection of the vulva and vagina to check for abnormal growths or skin changes, a visual exam of the cervix, and then an exam to evaluate the size and shape of the uterus and ovaries.  And after 73 years of age, a rectal exam is indicated to check for rectal cancers. We will also provide age appropriate advice regarding health maintenance, like when you should have certain vaccines, screening for sexually transmitted diseases, bone density testing, colonoscopy, mammograms, etc.    MAMMOGRAMS:  All women over 78 years old should have a yearly mammogram. Many facilities now offer a "3D" mammogram, which may cost around $50 extra out of pocket. If possible,  we recommend you accept the option to have the 3D mammogram performed.  It both reduces the number of women who will be called back for extra views which then turn out to be normal, and it is better than the routine mammogram at detecting truly abnormal areas.     COLONOSCOPY:  Colonoscopy to screen for colon cancer is  recommended for all  women at age 45.  We know, you hate the idea of the prep.  We agree, BUT, having colon cancer and not knowing it is worse!!  Colon cancer so often starts as a polyp that can be seen and removed at colonscopy, which can quite literally save your life!  And if your first colonoscopy is normal and you have no family history of colon cancer, most women don't have to have it again for 10 years.  Once every ten years, you can do something that may end up saving your life, right?  We will be happy to help you get it scheduled when you are ready.  Be sure to check your insurance coverage so you understand how much it will cost.  It may be covered as a preventative service at no cost, but you should check your particular policy.

## 2015-12-31 DIAGNOSIS — N899 Noninflammatory disorder of vagina, unspecified: Secondary | ICD-10-CM | POA: Insufficient documentation

## 2015-12-31 DIAGNOSIS — Z0000 Encounter for general adult medical examination without abnormal findings: Secondary | ICD-10-CM | POA: Insufficient documentation

## 2015-12-31 NOTE — Assessment & Plan Note (Signed)
Extensive counseling on disease process and treatment plan of lifestyle modifications. Recheck A1c 4 months

## 2015-12-31 NOTE — Assessment & Plan Note (Signed)
Counseling done- advised wt loss.  Discussed with patient importance of weight loss to help achieve health goals and how increasing weight, correlates to increasing risk of disease. (or increasing risk of not controlling existing diseases.) 

## 2015-12-31 NOTE — Assessment & Plan Note (Signed)
reassured pt no rash or D/C of vaginal region.    Cleaning and care d/c pt extensively; handouts on PH neutral vagina wipes to use when she goes to the bathroom prn

## 2016-01-15 ENCOUNTER — Other Ambulatory Visit: Payer: Self-pay

## 2016-01-15 MED ORDER — GABAPENTIN 300 MG PO CAPS: 900.0000 mg | ORAL_CAPSULE | Freq: Three times a day (TID) | ORAL | 2 refills | Status: DC

## 2016-01-15 MED ORDER — LOSARTAN POTASSIUM-HCTZ 100-25 MG PO TABS: 1.0000 | ORAL_TABLET | Freq: Every day | ORAL | 0 refills | Status: DC

## 2016-01-22 DIAGNOSIS — M170 Bilateral primary osteoarthritis of knee: Secondary | ICD-10-CM | POA: Diagnosis not present

## 2016-02-02 ENCOUNTER — Other Ambulatory Visit: Payer: Self-pay

## 2016-02-02 MED ORDER — OXYBUTYNIN CHLORIDE ER 10 MG PO TB24: 10.0000 mg | ORAL_TABLET | Freq: Every day | ORAL | 1 refills | Status: DC

## 2016-02-02 NOTE — Telephone Encounter (Signed)
We have not filled this medication for the patient previously.  Please approve refill if appropriate.  Thanks!  Charyl Bigger, CMA

## 2016-02-04 ENCOUNTER — Encounter (HOSPITAL_COMMUNITY): Payer: Self-pay | Admitting: Emergency Medicine

## 2016-02-04 ENCOUNTER — Ambulatory Visit (HOSPITAL_COMMUNITY)
Admission: EM | Admit: 2016-02-04 | Discharge: 2016-02-04 | Disposition: A | Discharge status: Home / Self Care | Payer: PPO | Attending: Emergency Medicine | Admitting: Emergency Medicine

## 2016-02-04 DIAGNOSIS — R0982 Postnasal drip: Secondary | ICD-10-CM | POA: Diagnosis not present

## 2016-02-04 DIAGNOSIS — J069 Acute upper respiratory infection, unspecified: Secondary | ICD-10-CM

## 2016-02-04 MED ORDER — PHENYLEPHRINE-CHLORPHEN-DM 10-4-12.5 MG/5ML PO LIQD: 5.0000 mL | ORAL | 0 refills | Status: DC | PRN

## 2016-02-04 NOTE — ED Provider Notes (Signed)
CSN: [id number]     Arrival date & time 02/04/16  1207 History   First MD Initiated Contact with Patient 02/04/16 1333     Chief Complaint  Patient presents with  . URI   (Consider location/radiation/quality/duration/timing/severity/associated sxs/prior Treatment) Pleasant 75 year old female complaining of cough, minor headache is getting better, sore throat and wheeze for about 4 days. She has no documented fever. Does not have a history of smoking or asthma. She is concerned about pneumonia or bronchitis. Her cough is nonproductive.      Past Medical History:  Diagnosis Date  . Anemia   . Anxiety   . Depression   . H/O hiatal hernia   . Hypercholesterolemia   . Incontinence of urine    Past Surgical History:  Procedure Laterality Date  . 2 Synovial Cysts removed between L4-L5    . ABDOMINAL HYSTERECTOMY    . BACK SURGERY     back fusion after 2 back surgeries   . CARPAL TUNNEL RELEASE    . EYE SURGERY     tube inserted for excessive tearing , tube is removed   . Right Knee Arthroscopy    . TONSILLECTOMY    . 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 Hypertension Mother   . Diabetes Mother   . Stroke Mother   . Alcoholism Father   . Alcohol abuse Father   . Depression Sister    Social History  Substance Use Topics  . Smoking status: Former Smoker    Types: Cigarettes    Quit date: 02/19/1995  . Smokeless tobacco: Never Used  . Alcohol use No   OB History    No data available     Review of Systems  Constitutional: Positive for activity change. Negative for appetite change, chills, fatigue and fever.  HENT: Positive for congestion, postnasal drip, rhinorrhea and sore throat. Negative for facial swelling.   Eyes: Negative.   Respiratory: Positive for cough. Negative for shortness of  breath.   Cardiovascular: Negative.   Gastrointestinal: Negative.   Musculoskeletal: Negative for neck pain and neck stiffness.  Skin: Negative for pallor and rash.  Neurological: Negative.     Allergies  Avelox [moxifloxacin hcl in nacl] and Cephalosporins  Home Medications   Prior to Admission medications   Medication Sig Start Date End Date Taking? Authorizing Provider  amLODipine (NORVASC) 5 MG tablet Take 5 mg by mouth daily before breakfast.     Historical Provider, MD  aspirin 81 MG chewable tablet Chew 81 mg by mouth daily.    Historical Provider, MD  calcium carbonate (TUMS EX) 750 MG chewable tablet Chew 2 tablets by mouth daily as needed for heartburn.    Historical Provider, MD  cholecalciferol (VITAMIN D) 1000 UNITS tablet Take 5,000 Units by mouth daily.    Historical Provider, MD  DULoxetine (CYMBALTA) 60 MG capsule Take 2 capsules (120 mg total) by mouth daily before breakfast. 11/20/15   Mellody Dance, DO  fluticasone (FLONASE) 50 MCG/ACT nasal spray Place 2 sprays into the nose daily.    Historical Provider, MD  gabapentin (NEURONTIN) 300 MG capsule Take 3 capsules (900 mg total) by mouth 3 (three) times daily. 01/15/16   Mellody Dance, DO  ibuprofen (ADVIL,MOTRIN) 800 MG tablet Take 800 mg by mouth at bedtime as needed.  Historical Provider, MD  losartan-hydrochlorothiazide (HYZAAR) 100-25 MG tablet Take 1 tablet by mouth daily before breakfast. 01/15/16   Mellody Dance, DO  mineral oil-hydrophilic petrolatum (AQUAPHOR) ointment Apply 1 application topically as needed for dry skin.    Historical Provider, MD  montelukast (SINGULAIR) 10 MG tablet Take 10 mg by mouth daily before breakfast.     Historical Provider, MD  niacin (NIASPAN) 500 MG CR tablet Take 1 tablet by mouth daily. 09/06/15   Historical Provider, MD  omega-3 acid ethyl esters (LOVAZA) 1 g capsule Take 2 capsules nightly before bed 11/17/15   Mellody Dance, DO  oxybutynin (DITROPAN-XL) 10 MG 24 hr  tablet Take 1 tablet (10 mg total) by mouth daily. 02/02/16   Mellody Dance, DO  Phenylephrine-Chlorphen-DM 11-22-10.5 MG/5ML LIQD Take 5 mLs by mouth every 4 (four) hours as needed. 02/04/16   Janne Napoleon, NP  simvastatin (ZOCOR) 40 MG tablet Take 1 tablet (40 mg total) by mouth at bedtime. 09/06/15   Mellody Dance, DO  traZODone (DESYREL) 100 MG tablet Take 1 tablet (100 mg total) by mouth at bedtime. 11/17/15   Mellody Dance, DO   Meds Ordered and Administered this Visit  Medications - No data to display  BP 124/70 (BP Location: Left Arm) Comment (BP Location): large cuff  Pulse 87   Temp 99.4 F (37.4 C) (Oral)   Resp 20   SpO2 98%  No data found.   Physical Exam  Constitutional: She is oriented to person, place, and time. She appears well-developed and well-nourished. No distress.  HENT:  Head: Normocephalic and atraumatic.  Right Ear: External ear normal.  Left Ear: External ear normal.  Bilateral TMs are normal. Oropharynx with minor streaky erythema and clear PND. No exudates or swelling. Airway widely patent.  Eyes: Conjunctivae and EOM are normal. Pupils are equal, round, and reactive to light.  Neck: Normal range of motion. Neck supple.  Cardiovascular: Normal rate, regular rhythm, normal heart sounds and intact distal pulses.   Pulmonary/Chest: Effort normal and breath sounds normal. No respiratory distress. She has no wheezes. She has no rales.  Lungs are perfectly clear. No adventitious sounds. Excellent chest expansion and air movement. Forced expiration and cough reveals no adventitious sounds or wheezing. No crackles or suspicion of pneumonia or bronchitis based on the clinical exam.  Musculoskeletal: Normal range of motion. She exhibits no edema.  Lymphadenopathy:    She has no cervical adenopathy.  Neurological: She is alert and oriented to person, place, and time.  Skin: Skin is warm and dry. No rash noted.  Psychiatric: She has a normal mood and affect.   Nursing note and vitals reviewed.   Urgent Care Course   Clinical Course     Procedures (including critical care time)  Labs Review Labs Reviewed - No data to display  Imaging Review No results found.   Visual Acuity Review  Right Eye Distance:   Left Eye Distance:   Bilateral Distance:    Right Eye Near:   Left Eye Near:    Bilateral Near:         MDM   1. Acute upper respiratory infection   2. PND (post-nasal drip)    Your lungs are perfectly clear today do not suspect that you have bronchitis or pneumonia. Primarily the sinus drainage is causing most of your symptoms. You are given a liquid that contains medicine to help with cough and drainage and congestion. He will probably want to take this at least one  hour before your flight on Wednesday if he continued to have symptoms. Drink plenty of cool liquids and stay well-hydrated. You may use Cepacol lozenges to help with sore throat pain. Take 400 mg of ibuprofen every 6 hours as needed for discomfort. He may also take Tylenol with or without the ibuprofen it is generally safe to do that. Meds ordered this encounter  Medications  . Phenylephrine-Chlorphen-DM 11-22-10.5 MG/5ML LIQD    Sig: Take 5 mLs by mouth every 4 (four) hours as needed.    Dispense:  120 mL    Refill:  0    Order Specific Question:   Supervising Provider    Answer:   Melony Overly [id number]       Janne Napoleon, NP 02/04/16 1359

## 2016-02-04 NOTE — Discharge Instructions (Signed)
Your lungs are perfectly clear today do not suspect that you have bronchitis or pneumonia. Primarily the sinus drainage is causing most of your symptoms. You are given a liquid that contains medicine to help with cough and drainage and congestion. He will probably want to take this at least one hour before your flight on Wednesday if he continued to have symptoms. Drink plenty of cool liquids and stay well-hydrated. You may use Cepacol lozenges to help with sore throat pain. Take 400 mg of ibuprofen every 6 hours as needed for discomfort. He may also take Tylenol with or without the ibuprofen it is generally safe to do that.

## 2016-02-04 NOTE — ED Triage Notes (Signed)
Started coughing on Thursday night.  Patient has been wheezing and coughing up phlegm-reports phlegm as yellow.  Patient has a history of pneumonia.  Last episode was approx a year ago.  Husband being treated for bronchitis and patient has been caring for sick spouse for several days not able to tend to her own symptoms, now is very weak.

## 2016-02-20 ENCOUNTER — Encounter: Payer: Self-pay | Admitting: Family Medicine

## 2016-02-20 ENCOUNTER — Ambulatory Visit (INDEPENDENT_AMBULATORY_CARE_PROVIDER_SITE_OTHER): Payer: PPO | Admitting: Family Medicine

## 2016-02-20 VITALS — BP 132/81 | HR 76 | Ht 65.25 in | Wt 213.1 lb

## 2016-02-20 DIAGNOSIS — R7303 Prediabetes: Secondary | ICD-10-CM | POA: Diagnosis not present

## 2016-02-20 DIAGNOSIS — E559 Vitamin D deficiency, unspecified: Secondary | ICD-10-CM | POA: Diagnosis not present

## 2016-02-20 DIAGNOSIS — I10 Essential (primary) hypertension: Secondary | ICD-10-CM

## 2016-02-20 DIAGNOSIS — M179 Osteoarthritis of knee, unspecified: Secondary | ICD-10-CM

## 2016-02-20 DIAGNOSIS — Z01818 Encounter for other preprocedural examination: Secondary | ICD-10-CM | POA: Diagnosis not present

## 2016-02-20 DIAGNOSIS — M1710 Unilateral primary osteoarthritis, unspecified knee: Secondary | ICD-10-CM

## 2016-02-20 NOTE — Patient Instructions (Addendum)
Reck pulse and BP - and document please.    Pt will check BP at home and try to work on low salt diet and walk if possible.   - CBC, CMP, Vit D today.  To ensure WNL's-    Also EKG today is WNL's at around 60-65 bpm  -  please try to fill your tank up--> drink more water-    For dry skin--> drink more water, humidifier at night.  And use eucerin on skin. Also f/up per your derm recommendations     Guidelines for a Low Sodium Diet   Low Sodium Diet A main source of sodium is table salt. The average American eats five or more teaspoons of salt each day. This is about 20 times as much as the body needs. In fact, your body needs only 1/4 teaspoon of salt every day. Sodium is found naturally in foods, but a lot of it is added during processing and preparation. Many foods that do not taste salty may still be high in sodium. Large amounts of sodium can be hidden in canned, processed and convenience foods. And sodium can be found in many foods that are served at [employer].  Sodium controls fluid balance in our bodies and maintains blood volume and blood pressure. Eating too much sodium may raise blood pressure and cause fluid retention, which could lead to swelling of the legs and feet or other health issues.  When limiting sodium in your diet, a common target is to eat less than 2,000 milligrams of sodium per day.   General Guidelines for Cutting Down on Salt Eliminate salty foods from your diet and reduce the amount of salt used in cooking. Sea salt is no better than regular salt.  Choose low sodium foods. Many salt-free or reduced salt products are available. When reading food labels, low sodium is defined as 140 mg of sodium per serving.  Salt substitutes are sometimes made from potassium, so read the label. If you are on a low potassium diet, then check with your doctor before using those salt substitutes.  Be creative and season your foods with spices, herbs, lemon, garlic,  ginger, vinegar and pepper. Remove the salt shaker from the table.  Read ingredient labels to identify foods high in sodium. Items with 400 mg or more of sodium are high in sodium. High sodium food additives include salt, brine, or other items that say sodium, such as monosodium glutamate.  Eat more home-cooked meals. Foods cooked from scratch are naturally lower in sodium than most instant and boxed mixes.  Don't use softened water for cooking and drinking since it contains added salt.  Avoid medications which contain sodium such as [employer].  For more information; food composition books are available which tell how much sodium is in food. Online sources such as www.calorieking.com also list amounts.     Meats, Poultry, Fish, Legumes, Eggs and Nuts  High-Sodium Foods: Smoked, cured, salted or canned meat, fish or poultry including bacon, cold cuts, ham, frankfurters, sausage, sardines, caviar and anchovies Frozen breaded meats and dinners, such as burritos and pizza Canned entrees, such as ravioli, spam and chili Salted nuts Beans canned with salt added  Low-Sodium Alternatives: Any fresh or frozen beef, lamb, pork, poultry and fish Eggs and egg substitutes Low-sodium peanut butter Dry peas and beans (not canned) Low-sodium canned fish Drained, water or oil packed canned fish or poultry   Dairy Products  High-Sodium Foods: Buttermilk Regular and  processed cheese, cheese spreads and sauces Cottage cheese  Low-Sodium Alternatives: Milk, yogurt, ice cream and ice milk Low-sodium cheeses, cream cheese, ricotta cheese and mozzarella   Breads, Grains and Cereals  High-Sodium Foods: Bread and rolls with salted tops Quick breads, self-rising flour, biscuit, pancake and waffle mixes Pizza, croutons and salted crackers Prepackaged, processed mixes for potatoes, rice, pasta and stuffing  Low-Sodium Alternatives: Breads, bagels and rolls without salted  tops Muffins and most ready-to-eat cereals All rice and pasta, but do not to add salt when cooking Low-sodium corn and flour tortillas and noodles Low-sodium crackers and breadsticks Unsalted popcorn, chips and pretzels      Vegetables and Fruits  High-Sodium Foods: Regular canned vegetables and vegetable juices Olives, pickles, sauerkraut and other pickled vegetables Vegetables made with ham, bacon or salted pork Packaged mixes, such as scalloped or au gratin potatoes, frozen hash browns and Tater Tots Commercially prepared pasta and tomato sauces and salsa  Low-Sodium Alternatives: Fresh and frozen vegetables without sauces Low-sodium canned vegetables, sauces and juices Fresh potatoes, frozen [place] fries and instant mashed potatoes Low-salt tomato or V-8 juice. Most fresh, frozen and canned fruit Dried fruits   Soups  High-Sodium Foods: Regular canned and dehydrated soup, broth and bouillon Cup of noodles and seasoned ramen mixes  Low-Sodium Alternatives: Low-sodium canned and dehydrated soups, broth and bouillon Homemade soups without added salt   Fats, Desserts and Sweets  High-Sodium Foods: Soy sauce, seasoning salt, other sauces and marinades Bottled salad dressings, regular salad dressing with bacon bits Salted butter or margarine Instant pudding and cake Large portions of ketchup, mustard  Low-Sodium Alternatives: Vinegar, unsalted butter or margarine Vegetable oils and low sodium sauces and salad dressings Mayonnaise All desserts made without salt SAL

## 2016-02-20 NOTE — Progress Notes (Signed)
--->  Seeing pt at the kind request of DR Noemi Chapel for pre-op clearance for R TKR near future.      Impression and Recommendations:    1. Pre-op evaluation   2. Essential hypertension, benign   3. Morbid obesity ([place])   4. Osteoarthritis of knee, unspecified laterality, unspecified osteoarthritis type   5. Prediabetes   6. Vitamin D deficiency     Pre-op evaluation EKG done in office today. -- > NSR; bpm 63 w/o ST or T-wave ABN appreciated.  CBC, CMP, in prep for SX; obtain vit D today as well since needed for chronic dx mgt  METS- good tolerance and appropriate for age, no need ESE etc   Essential hypertension, benign 02/20/16: BP historically has been very well controlled.  up today b/c didn't take meds this am.   No change.   Pt to monitor at home to ensure remains well controlled of less than 140/90 as her usual.  - take meds in am prior to sx unless told otherwise by surgeon/ anesthesia  Prediabetes Stable   - Pt will check BP at home and try to work on low salt diet and walk if possible.   - CBC, CMP, Vit D today.  To ensure WNL's-    - Also EKG today is WNL's at around 60-65 bpm  -  please try to fill your tank up--> drink more water-     For occ dry skin--> drink more water, humidifier at night.  And use eucerin on skin. Also f/up per your derm recommendations    Orders Placed This Encounter  Procedures  . CBC with Differential/Platelet  . VITAMIN D 25 Hydroxy (Vit-D Deficiency, Fractures)  . Comprehensive metabolic panel     New Prescriptions   ACETAMINOPHEN (TYLENOL) 325 MG TABLET    Take 2 tablets (650 mg total) by mouth every 6 (six) hours as needed for mild pain (or Fever >/= 101).   ASPIRIN EC 325 MG EC TABLET    1 tab a day for the next 30 days to prevent blood clots   CLINDAMYCIN (CLEOCIN) 300 MG CAPSULE    Take 1 capsule (300 mg total) by mouth every 6 (six) hours.   DOCUSATE SODIUM (COLACE) 100 MG CAPSULE    1 tab 2 times a day while on  narcotics.  STOOL SOFTENER   OXYCODONE (OXY IR/ROXICODONE) 5 MG IMMEDIATE RELEASE TABLET    1-2 tablets every 4-6 hrs as needed for pain   POLYETHYLENE GLYCOL (MIRALAX / GLYCOLAX) PACKET    17grams in 6 oz of water twice a day until bowel movement.  LAXITIVE.  Restart if two days since last bowel movement    Modified Medications   Modified Medication Previous Medication   TRAZODONE (DESYREL) 100 MG TABLET traZODone (DESYREL) 100 MG tablet      Take 1 tablet (100 mg total) by mouth at bedtime.    Take 1 tablet (100 mg total) by mouth at bedtime.    Discontinued Medications   ASPIRIN 81 MG CHEWABLE TABLET    Chew 81 mg by mouth daily.   PHENYLEPHRINE-CHLORPHEN-DM 11-22-10.5 MG/5ML LIQD    Take 5 mLs by mouth every 4 (four) hours as needed.    The patient was counseled, risk factors were discussed, anticipatory guidance given.  Gross side effects, risk and benefits, and alternatives of medications and treatment plan in general discussed with patient.  Patient is aware that all medications have potential side effects and we are unable  to predict every side effect or drug-drug interaction that may occur.   Patient will call with any questions prior to using medication if they have concerns.  Expresses verbal understanding and consents to current therapy and treatment regimen.  No barriers to understanding were identified.  Red flag symptoms and signs discussed in detail.  Patient expressed understanding regarding what to do in case of emergency\urgent symptoms  Return for F/UP AS PLANNED FOR CHRONIC DX MGT.  Please see AVS handed out to patient at the end of our visit for further patient instructions/ counseling done pertaining to today's office visit.    Note: This document was prepared using Dragon voice recognition software and may include unintentional dictation  errors.   --------------------------------------------------------------------------------------------------------------------------------------------------------------------------------------------------------------------------------------------    Subjective:    CC:  Chief Complaint  Patient presents with  . Pre-op Exam    HPI: Shelly Hickman is a 76 y.o. female who presents to [place] at [facility] today for issues as discussed below.   Having TKA of R knee on Jan 15th.  Here with paperwork from Dr Noemi Chapel office asking me to fill out for her upcoming sx and asking for my recs in preparation for sx.    - Tolerated anesthesia in past w/o diff- no s-e or events.   - mets-  Can go up and down stairs, can carry in bags of groceries w/o diff. No CP, DOE, no swelling in legs, no palp- see ROS.   No h/o adverse effect post-op.  No recent F/C- got over her ilness she had a christmas- a viral illness- now w/o Sx.    - rushed out w/o taking BP meds this am- usually under very good control.   Not checking Bp at home but has machine.    Wt Readings from Last 3 Encounters:  03/04/16 215 lb (97.5 kg)  02/22/16 215 lb 4.8 oz (97.7 kg)  02/20/16 213 lb 1.6 oz (96.7 kg)   BP Readings from Last 3 Encounters:  03/07/16 (!) 104/48  02/22/16 (!) 132/99  02/20/16 132/81   Pulse Readings from Last 3 Encounters:  03/07/16 70  02/22/16 89  02/20/16 76   BMI Readings from Last 3 Encounters:  03/04/16 34.70 kg/m  02/22/16 34.75 kg/m  02/20/16 35.19 kg/m     Patient Care Team    Relationship Specialty Notifications Start End  Mellody Dance, DO PCP - General Family Medicine  08/30/15   Almedia Balls, MD Consulting Physician Orthopedic Surgery  10/18/15   Kristeen Miss, MD Consulting Physician Neurosurgery  10/18/15    Comment: spine sx  Allyn Kenner, MD Consulting Physician Dermatology  10/18/15    Comment: skin screening   Elsie Saas, MD Consulting Physician  Orthopedic Surgery  02/20/16     Patient Active Problem List   Diagnosis Date Noted  . Morbid obesity ([place]) 09/09/2015    Priority: High  . Prediabetes 09/06/2015    Priority: High  . Hypertriglyceridemia 09/06/2015    Priority: High  . HLD (hyperlipidemia) 08/30/2015    Priority: High  . Essential hypertension, benign 07/14/2013    Priority: High  . Anxiety 07/14/2013    Priority: High  . Insomnia 07/14/2013    Priority: High  . Clinical depression 03/26/2011    Priority: High  . Low HDL (under 40) 09/06/2015    Priority: Medium  . Incontinence of urine 09/05/2013    Priority: Medium  . GERD (gastroesophageal reflux disease) 12/10/2012    Priority: Medium  .  Pre-op evaluation 05/30/2016  . 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 Encounter for wellness examination 12/31/2015  . Primary localized osteoarthritis of right knee 12/12/2015  . Spinal stenosis in cervical region 10/18/2015  . Spinal stenosis of lumbar region 10/18/2015  . Memory impairment 08/30/2015  . Vitamin D deficiency 08/30/2015  . Status post left hip replacement 07/14/2013  . Constipation 07/14/2013  . Allergic rhinitis 07/14/2013  . Osteopenia 06/27/2011  . 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 Degenerative joint disease involving multiple joints 04/06/2010    Past Medical history, Surgical history, Family history, Social history, Allergies and Medications have been entered into the medical record, reviewed and changed as needed.   Allergies:  Allergies  Allergen Reactions  . Avelox [Moxifloxacin Hcl In Nacl] Other (See Comments)    CHEST PAIN OF UNSPECIFIED ORIGIN  . Cephalosporins Hives and Itching  . Other Itching and Swelling    UNSPECIFIED INSECT STINGS    Review of Systems  Constitutional: Negative for diaphoresis and weight loss.  HENT: Negative for nosebleeds.   Eyes: Negative for blurred vision and double vision.  Respiratory: Negative for cough, shortness of  breath and wheezing.   Cardiovascular: Negative for chest pain, palpitations, orthopnea, claudication, leg swelling and PND.  Gastrointestinal: Negative for diarrhea, nausea and vomiting.  Musculoskeletal: Negative for falls and myalgias.  Skin: Negative for rash.  Neurological: Negative for dizziness and focal weakness.  Endo/Heme/Allergies: Negative for polydipsia.  Psychiatric/Behavioral: Negative for memory loss.     Objective:   Blood pressure 132/81, pulse 76, height 5' 5.25" (1.657 m), weight 213 lb 1.6 oz (96.7 kg). Body mass index is 35.19 kg/m. General: Well Developed, well nourished, appropriate for stated age.  Neuro: Alert and oriented x3, extra-ocular muscles intact, sensation grossly intact.  HEENT: Normocephalic, atraumatic, neck supple, no carotid bruits appreciated  Skin: no gross rash. Cardiac: RRR, S1 S2 Respiratory: ECTA B/L, Not using accessory muscles, speaking in full sentences-unlabored. Vascular:  Ext warm, dry, pink; cap RF less 2 sec. Psych: No HI/SI, judgement and insight good, Euthymic mood. Full Affect.

## 2016-02-21 DIAGNOSIS — M1711 Unilateral primary osteoarthritis, right knee: Secondary | ICD-10-CM | POA: Diagnosis not present

## 2016-02-21 LAB — CBC WITH DIFFERENTIAL/PLATELET
Basophils Absolute: 0 10*3/uL (ref 0.0–0.2)
Basos: 0 %
EOS (ABSOLUTE): 0.5 10*3/uL — ABNORMAL HIGH (ref 0.0–0.4)
Eos: 5 %
Hematocrit: 40.2 % (ref 34.0–46.6)
Hemoglobin: 13.2 g/dL (ref 11.1–15.9)
Immature Grans (Abs): 0 10*3/uL (ref 0.0–0.1)
Immature Granulocytes: 0 %
Lymphocytes Absolute: 2.3 10*3/uL (ref 0.7–3.1)
Lymphs: 23 %
MCH: 28.8 pg (ref 26.6–33.0)
MCHC: 32.8 g/dL (ref 31.5–35.7)
MCV: 88 fL (ref 79–97)
Monocytes Absolute: 0.9 10*3/uL (ref 0.1–0.9)
Monocytes: 9 %
Neutrophils Absolute: 6.4 10*3/uL (ref 1.4–7.0)
Neutrophils: 63 %
Platelets: 433 10*3/uL — ABNORMAL HIGH (ref 150–379)
RBC: 4.59 x10E6/uL (ref 3.77–5.28)
RDW: 14.5 % (ref 12.3–15.4)
WBC: 10.2 10*3/uL (ref 3.4–10.8)

## 2016-02-21 LAB — COMPREHENSIVE METABOLIC PANEL
ALT: 16 IU/L (ref 0–32)
AST: 14 IU/L (ref 0–40)
Albumin/Globulin Ratio: 1.5 (ref 1.2–2.2)
Albumin: 4.2 g/dL (ref 3.5–4.8)
Alkaline Phosphatase: 72 IU/L (ref 39–117)
BUN/Creatinine Ratio: 23 (ref 12–28)
BUN: 17 mg/dL (ref 8–27)
Bilirubin Total: 0.4 mg/dL (ref 0.0–1.2)
CO2: 28 mmol/L (ref 18–29)
Calcium: 10 mg/dL (ref 8.7–10.3)
Chloride: 99 mmol/L (ref 96–106)
Creatinine, Ser: 0.74 mg/dL (ref 0.57–1.00)
GFR calc Af Amer: 93 mL/min/{1.73_m2} (ref >59)
GFR calc non Af Amer: 81 mL/min/{1.73_m2} (ref >59)
Globulin, Total: 2.8 g/dL (ref 1.5–4.5)
Glucose: 89 mg/dL (ref 65–99)
Potassium: 4.7 mmol/L (ref 3.5–5.2)
Sodium: 142 mmol/L (ref 134–144)
Total Protein: 7 g/dL (ref 6.0–8.5)

## 2016-02-21 LAB — VITAMIN D 25 HYDROXY (VIT D DEFICIENCY, FRACTURES): Vit D, 25-Hydroxy: 45.8 ng/mL (ref 30.0–100.0)

## 2016-02-21 NOTE — H&P (Signed)
TOTAL KNEE ADMISSION H&P  Patient is being admitted for right total knee arthroplasty.  Subjective:  Chief Complaint:right knee pain.  HPI: Shelly Hickman, 76 y.o. female, has a history of pain and functional disability in the right knee due to arthritis and has failed non-surgical conservative treatments for greater than 12 weeks to includeNSAID's and/or analgesics, corticosteriod injections, viscosupplementation injections, flexibility and strengthening excercises, supervised PT with diminished ADL's post treatment and activity modification.  Onset of symptoms was gradual, starting 10 years ago with gradually worsening course since that time. The patient noted prior procedures on the knee to include  arthroscopy and menisectomy on the right knee(s).  Patient currently rates pain in the right knee(s) at 10 out of 10 with activity. Patient has night pain, worsening of pain with activity and weight bearing, pain that interferes with activities of daily living, crepitus and joint swelling.  Patient has evidence of subchondral sclerosis, periarticular osteophytes and joint space narrowing by imaging studies.There is no active infection.  Patient Active Problem List   Diagnosis Date Noted  . 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 Encounter for wellness examination 12/31/2015  . Primary localized osteoarthritis of right knee 12/12/2015  . Spinal stenosis in cervical region 10/18/2015  . Spinal stenosis of lumbar region 10/18/2015  . Morbid obesity ([place]) 09/09/2015  . Prediabetes 09/06/2015  . Low HDL (under 40) 09/06/2015  . Hypertriglyceridemia 09/06/2015  . HLD (hyperlipidemia) 08/30/2015  . Memory impairment 08/30/2015  . Vitamin D deficiency 08/30/2015  . Incontinence of urine 09/05/2013  . Status post left hip replacement 07/14/2013  . Essential hypertension, benign 07/14/2013  . Constipation 07/14/2013  . Allergic rhinitis 07/14/2013  . Anxiety 07/14/2013  . Insomnia  07/14/2013  . GERD (gastroesophageal reflux disease) 12/10/2012  . Osteopenia 06/27/2011  . Clinical depression 03/26/2011  . 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 Degenerative joint disease involving multiple joints 04/06/2010   Past Medical History:  Diagnosis Date  . Anemia   . Anxiety   . Depression   . H/O hiatal hernia   . Hypercholesterolemia   . Incontinence of urine     Past Surgical History:  Procedure Laterality Date  . 2 Synovial Cysts removed between L4-L5    . ABDOMINAL HYSTERECTOMY    . BACK SURGERY     back fusion after 2 back surgeries   . CARPAL TUNNEL RELEASE    . EYE SURGERY     tube inserted for excessive tearing , tube is removed   . Right Knee Arthroscopy    . SALPINGOOPHORECTOMY  1993  . TONSILLECTOMY    . 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   Procedure: TOTAL HIP ARTHROPLASTY;  Surgeon: Kerin Salen, MD;  Location: [place];  Service: Orthopedics;  Laterality: Left;    No current facility-administered medications for this encounter.   Current Outpatient Prescriptions:  .  amLODipine (NORVASC) 5 MG tablet, Take 5 mg by mouth daily before breakfast. , Disp: , Rfl:  .  aspirin 81 MG chewable tablet, Chew 81 mg by mouth daily., Disp: , Rfl:  .  Calcium Carb-Cholecalciferol (CALCIUM 600-D PO), Take 1 tablet by mouth daily., Disp: , Rfl:  .  calcium carbonate (TUMS EX) 750 MG chewable tablet, Chew 2 tablets by mouth daily as needed for heartburn., Disp: , Rfl:  .  Cholecalciferol (VITAMIN D-3) 5000 units TABS, Take 5,000 Units by mouth daily., Disp: , Rfl:  .  DULoxetine (CYMBALTA) 60 MG capsule, Take 2 capsules (120  mg total) by mouth daily before breakfast., Disp: 180 capsule, Rfl: 1 .  fluticasone (CUTIVATE) 0.05 % cream, Apply 1 application topically 2 (two) times daily as needed (irritation). , Disp: , Rfl:  .  fluticasone (FLONASE) 50 MCG/ACT nasal spray, Place 2 sprays into the nose daily., Disp: , Rfl:  .  gabapentin (NEURONTIN) 300 MG  capsule, Take 3 capsules (900 mg total) by mouth 3 (three) times daily., Disp: 270 capsule, Rfl: 2 .  ketoconazole (NIZORAL) 2 % cream, Apply 1 application topically 2 (two) times daily as needed for irritation., Disp: , Rfl:  .  losartan-hydrochlorothiazide (HYZAAR) 100-25 MG tablet, Take 1 tablet by mouth daily before breakfast., Disp: 90 tablet, Rfl: 0 .  mineral oil-hydrophilic petrolatum (AQUAPHOR) ointment, Apply 1 application topically as needed for dry skin., Disp: , Rfl:  .  montelukast (SINGULAIR) 10 MG tablet, Take 10 mg by mouth daily before breakfast. , Disp: , Rfl:  .  niacin (NIASPAN) 500 MG CR tablet, Take 1 tablet by mouth at bedtime. , Disp: , Rfl:  .  omega-3 acid ethyl esters (LOVAZA) 1 g capsule, Take 2 capsules nightly before bed, Disp: 180 capsule, Rfl: 1 .  oxybutynin (DITROPAN-XL) 10 MG 24 hr tablet, Take 1 tablet (10 mg total) by mouth daily., Disp: 90 tablet, Rfl: 1 .  simvastatin (ZOCOR) 40 MG tablet, Take 1 tablet (40 mg total) by mouth at bedtime., Disp: 90 tablet, Rfl: 3 .  traZODone (DESYREL) 100 MG tablet, Take 1 tablet (100 mg total) by mouth at bedtime., Disp: 90 tablet, Rfl: 0    Allergies  Allergen Reactions  . Avelox [Moxifloxacin Hcl In Nacl] Other (See Comments)    Chest pain  . Cephalosporins Hives and Itching  . Other Itching and Swelling    Insect stings    Social History  Substance Use Topics  . Smoking status: Former Smoker    Types: Cigarettes    Quit date: 02/19/1995  . Smokeless tobacco: Never Used  . Alcohol use No    Family History  Problem Relation Age of Onset  . Hypertension Mother   . Diabetes Mother   . Stroke Mother   . Alcoholism Father   . Alcohol abuse Father   . Depression Sister      Review of Systems  Constitutional: Negative.   HENT: Negative.   Eyes: Negative.   Respiratory: Negative.   Cardiovascular: Negative.   Gastrointestinal: Negative.   Genitourinary: Negative.   Musculoskeletal: Positive for back pain  and joint pain.  Skin: Negative.   Neurological: Negative.   Endo/Heme/Allergies: Negative.   Psychiatric/Behavioral: Negative.     Objective:  Physical Exam  Constitutional: She is oriented to person, place, and time. She appears well-developed and well-nourished.  HENT:  Head: Normocephalic and atraumatic.  Mouth/Throat: Oropharynx is clear and moist.  Eyes: Conjunctivae are normal. Pupils are equal, round, and reactive to light.  Neck: Normal range of motion. Neck supple.  Cardiovascular: Regular rhythm.   Respiratory: Effort normal.  GI: Soft.  Genitourinary:  Genitourinary Comments: Not pertinent to current symptomatology therefore not examined.  Musculoskeletal:  Examination of her right knee reveals pain medially and laterally.  1+ crepitation.  1+ synovitis.  Full range of motion.  Knee is stable with normal patella tracking.  Examination of her left knee reveals pain medially and laterally.  1+ crepitation.  1+ synovitis.  Full range of motion.  Knee is stable with normal patella tracking.    Neurological: She is alert  and oriented to person, place, and time.  Skin: Skin is warm and dry.  Psychiatric: She has a normal mood and affect. Her behavior is normal.    Vital signs in last 24 hours:    Labs:   Estimated body mass index is 35.19 kg/m as calculated from the following:   Height as of 02/20/16: 5' 5.25" (1.657 m).   Weight as of 02/20/16: 96.7 kg (213 lb 1.6 oz).   Imaging Review Plain radiographs demonstrate severe degenerative joint disease of the right knee(s). The overall alignment issignificant valgus. The bone quality appears to be good for age and reported activity level.  Assessment/Plan:  End stage arthritis, right knee  Principal Problem:   Primary localized osteoarthritis of right knee Active Problems:   Status post left hip replacement   Essential hypertension, benign   Constipation   Anxiety   Insomnia   Clinical depression   Degenerative  joint disease involving multiple joints   Osteopenia   Memory impairment   Vitamin D deficiency   Morbid obesity ([place])   Spinal stenosis in cervical region   Spinal stenosis of lumbar region   The patient history, physical examination, clinical judgment of the provider and imaging studies are consistent with end stage degenerative joint disease of the right knee(s) and total knee arthroplasty is deemed medically necessary. The treatment options including medical management, injection therapy arthroscopy and arthroplasty were discussed at length. The risks and benefits of total knee arthroplasty were presented and reviewed. The risks due to aseptic loosening, infection, stiffness, patella tracking problems, thromboembolic complications and other imponderables were discussed. The patient acknowledged the explanation, agreed to proceed with the plan and consent was signed. Patient is being admitted for inpatient treatment for surgery, pain control, PT, OT, prophylactic antibiotics, VTE prophylaxis, progressive ambulation and ADL's and discharge planning. The patient is planning to be discharged home with home health services but may go to [facility] if she does not progress.

## 2016-02-22 ENCOUNTER — Encounter (HOSPITAL_COMMUNITY)
Admission: RE | Admit: 2016-02-22 | Discharge: 2016-02-22 | Disposition: A | Discharge status: Home / Self Care | Payer: PPO | Source: Ambulatory Visit | Attending: Orthopedic Surgery | Admitting: Orthopedic Surgery

## 2016-02-22 ENCOUNTER — Encounter (HOSPITAL_COMMUNITY): Payer: Self-pay

## 2016-02-22 DIAGNOSIS — M1712 Unilateral primary osteoarthritis, left knee: Secondary | ICD-10-CM | POA: Insufficient documentation

## 2016-02-22 HISTORY — DX: Unspecified osteoarthritis, unspecified site: M19.90

## 2016-02-22 HISTORY — DX: Myoneural disorder, unspecified: G70.9

## 2016-02-22 LAB — PROTIME-INR
INR: 0.87
Prothrombin Time: 11.8 seconds (ref 11.4–15.2)

## 2016-02-22 LAB — TYPE AND SCREEN
ABO/RH(D): B POS
Antibody Screen: NEGATIVE

## 2016-02-22 LAB — SURGICAL PCR SCREEN
MRSA, PCR: NEGATIVE
Staphylococcus aureus: NEGATIVE

## 2016-02-22 LAB — APTT: aPTT: 34 seconds (ref 24–36)

## 2016-02-22 NOTE — Pre-Procedure Instructions (Signed)
Shelly Hickman  02/22/2016      [facility] - [place] - [address] [place] [address] Phone: [phone] Fax: [phone]    Your procedure is scheduled on January 15  Report to [facility] at 0700 A.M.  Call this number if you have problems the morning of surgery:  [phone]   Remember:  Do not eat food or drink liquids after midnight.   Take these medicines the morning of surgery with A SIP OF WATER amLODipine (NORVASC), DULoxetine (CYMBALTA), fluticasone (FLONASE), gabapentin (NEURONTIN), montelukast (SINGULAIR), oxybutynin (DITROPAN-XL)  7 days prior to surgery STOP taking any Aspirin, Aleve, Naproxen, Ibuprofen, Motrin, Advil, Goody's, BC's, all herbal medications, fish oil, and all vitamins    Do not wear jewelry, make-up or nail polish.  Do not wear lotions, powders, or perfumes, or deoderant.  Do not shave 48 hours prior to surgery.    Do not bring valuables to the hospital.  [facility] is not responsible for any belongings or valuables.  Contacts, dentures or bridgework may not be worn into surgery.  Leave your suitcase in the car.  After surgery it may be brought to your room.  For patients admitted to the hospital, discharge time will be determined by your treatment team.  Patients discharged the day of surgery will not be allowed to drive home.    Special instructions:   [place]- Preparing For Surgery  Before surgery, you can play an important role. Because skin is not sterile, your skin needs to be as free of germs as possible. You can reduce the number of germs on your skin by washing with CHG (chlorahexidine gluconate) Soap before surgery.  CHG is an antiseptic cleaner which kills germs and bonds with the skin to continue killing germs even after washing.  Please do not use if you have an allergy to CHG or antibacterial soaps. If your skin becomes  reddened/irritated stop using the CHG.  Do not shave (including legs and underarms) for at least 48 hours prior to first CHG shower. It is OK to shave your face.  Please follow these instructions carefully.   1. Shower the NIGHT BEFORE SURGERY and the MORNING OF SURGERY with CHG.   2. If you chose to wash your hair, wash your hair first as usual with your normal shampoo.  3. After you shampoo, rinse your hair and body thoroughly to remove the shampoo.  4. Use CHG as you would any other liquid soap. You can apply CHG directly to the skin and wash gently with a scrungie or a clean washcloth.   5. Apply the CHG Soap to your body ONLY FROM THE NECK DOWN.  Do not use on open wounds or open sores. Avoid contact with your eyes, ears, mouth and genitals (private parts). Wash genitals (private parts) with your normal soap.  6. Wash thoroughly, paying special attention to the area where your surgery will be performed.  7. Thoroughly rinse your body with warm water from the neck down.  8. DO NOT shower/wash with your normal soap after using and rinsing off the CHG Soap.  9. Pat yourself dry with a CLEAN TOWEL.   10. Wear CLEAN PAJAMAS   11. Place CLEAN SHEETS on your bed the night of your first shower and DO NOT SLEEP WITH PETS.    Day of Surgery: Do not apply any deodorants/lotions. Please wear clean clothes to the  hospital/surgery center.      Please read over the following fact sheets that you were given.

## 2016-02-22 NOTE — Progress Notes (Signed)
PCP - Mellody Dance Cardiologist - denies  Chest x-ray - not needed EKG - 02/20/15 Stress Test -> 10 years ago could not remember where  ECHO - denies Cardiac Cath - denies      Patient denies shortness of breath, fever, cough and chest pain at PAT appointment

## 2016-02-23 LAB — URINE CULTURE

## 2016-02-29 DIAGNOSIS — M1711 Unilateral primary osteoarthritis, right knee: Secondary | ICD-10-CM | POA: Diagnosis not present

## 2016-03-03 ENCOUNTER — Encounter (HOSPITAL_COMMUNITY): Payer: Self-pay | Admitting: Anesthesiology

## 2016-03-03 MED ORDER — VANCOMYCIN HCL 10 G IV SOLR
1500.0000 mg | INTRAVENOUS | Status: AC
  Administered 2016-03-04 (×2): 1500 mg via INTRAVENOUS
  Filled 2016-03-03 (×2): qty 1500

## 2016-03-03 NOTE — Anesthesia Preprocedure Evaluation (Addendum)
Anesthesia Evaluation  Patient identified by MRN, date of birth, ID band Patient awake    Reviewed: Allergy & Precautions, H&P , NPO status , Patient's Chart, lab work & pertinent test results  Airway Mallampati: II  TM Distance: >3 FB Neck ROM: full    Dental no notable dental hx.    Pulmonary former smoker,    Pulmonary exam normal        Cardiovascular Exercise Tolerance: Good hypertension, Pt. on medications Normal cardiovascular exam     Neuro/Psych    GI/Hepatic Neg liver ROS,   Endo/Other  negative endocrine ROS  Renal/GU negative Renal ROS  negative genitourinary   Musculoskeletal   Abdominal (+) + obese,   Peds  Hematology   Anesthesia Other Findings   Reproductive/Obstetrics negative OB ROS                           Anesthesia Physical Anesthesia Plan  ASA: II  Anesthesia Plan: Spinal   Post-op Pain Management:  Regional for Post-op pain   Induction:   Airway Management Planned:   Additional Equipment:   Intra-op Plan:   Post-operative Plan:   Informed Consent: I have reviewed the patients History and Physical, chart, labs and discussed the procedure including the risks, benefits and alternatives for the proposed anesthesia with the patient or authorized representative who has indicated his/her understanding and acceptance.     Plan Discussed with: CRNA and Surgeon  Anesthesia Plan Comments:        Anesthesia Quick Evaluation

## 2016-03-04 ENCOUNTER — Inpatient Hospital Stay (HOSPITAL_COMMUNITY)
Admission: RE | Admit: 2016-03-04 | Discharge: 2016-03-07 | DRG: 470 | Disposition: A | Discharge status: Home Health | Payer: PPO | Source: Ambulatory Visit | Attending: Orthopedic Surgery | Admitting: Orthopedic Surgery

## 2016-03-04 ENCOUNTER — Inpatient Hospital Stay (HOSPITAL_COMMUNITY): Payer: PPO | Admitting: Certified Registered Nurse Anesthetist

## 2016-03-04 ENCOUNTER — Encounter (HOSPITAL_COMMUNITY): Payer: Self-pay | Admitting: *Deleted

## 2016-03-04 ENCOUNTER — Encounter (HOSPITAL_COMMUNITY)
Admission: RE | Disposition: A | Discharge status: Home Health | Payer: Self-pay | Source: Ambulatory Visit | Attending: Orthopedic Surgery

## 2016-03-04 DIAGNOSIS — Z91038 Other insect allergy status: Secondary | ICD-10-CM

## 2016-03-04 DIAGNOSIS — Z818 Family history of other mental and behavioral disorders: Secondary | ICD-10-CM

## 2016-03-04 DIAGNOSIS — Z811 Family history of alcohol abuse and dependence: Secondary | ICD-10-CM

## 2016-03-04 DIAGNOSIS — Z96642 Presence of left artificial hip joint: Secondary | ICD-10-CM | POA: Diagnosis not present

## 2016-03-04 DIAGNOSIS — Z6834 Body mass index (BMI) 34.0-34.9, adult: Secondary | ICD-10-CM

## 2016-03-04 DIAGNOSIS — G4700 Insomnia, unspecified: Secondary | ICD-10-CM | POA: Diagnosis present

## 2016-03-04 DIAGNOSIS — Z87891 Personal history of nicotine dependence: Secondary | ICD-10-CM | POA: Diagnosis not present

## 2016-03-04 DIAGNOSIS — K219 Gastro-esophageal reflux disease without esophagitis: Secondary | ICD-10-CM | POA: Diagnosis present

## 2016-03-04 DIAGNOSIS — Z888 Allergy status to other drugs, medicaments and biological substances status: Secondary | ICD-10-CM | POA: Diagnosis not present

## 2016-03-04 DIAGNOSIS — Z8701 Personal history of pneumonia (recurrent): Secondary | ICD-10-CM | POA: Diagnosis not present

## 2016-03-04 DIAGNOSIS — Z7982 Long term (current) use of aspirin: Secondary | ICD-10-CM

## 2016-03-04 DIAGNOSIS — F329 Major depressive disorder, single episode, unspecified: Secondary | ICD-10-CM | POA: Diagnosis present

## 2016-03-04 DIAGNOSIS — M8580 Other specified disorders of bone density and structure, unspecified site: Secondary | ICD-10-CM | POA: Diagnosis present

## 2016-03-04 DIAGNOSIS — I10 Essential (primary) hypertension: Secondary | ICD-10-CM | POA: Diagnosis not present

## 2016-03-04 DIAGNOSIS — E785 Hyperlipidemia, unspecified: Secondary | ICD-10-CM | POA: Diagnosis not present

## 2016-03-04 DIAGNOSIS — Z823 Family history of stroke: Secondary | ICD-10-CM

## 2016-03-04 DIAGNOSIS — E781 Pure hyperglyceridemia: Secondary | ICD-10-CM | POA: Diagnosis not present

## 2016-03-04 DIAGNOSIS — E7800 Pure hypercholesterolemia, unspecified: Secondary | ICD-10-CM | POA: Diagnosis not present

## 2016-03-04 DIAGNOSIS — R062 Wheezing: Secondary | ICD-10-CM

## 2016-03-04 DIAGNOSIS — Z7951 Long term (current) use of inhaled steroids: Secondary | ICD-10-CM | POA: Diagnosis not present

## 2016-03-04 DIAGNOSIS — Z881 Allergy status to other antibiotic agents status: Secondary | ICD-10-CM | POA: Diagnosis not present

## 2016-03-04 DIAGNOSIS — M4802 Spinal stenosis, cervical region: Secondary | ICD-10-CM | POA: Diagnosis not present

## 2016-03-04 DIAGNOSIS — M159 Polyosteoarthritis, unspecified: Secondary | ICD-10-CM | POA: Diagnosis present

## 2016-03-04 DIAGNOSIS — Z96651 Presence of right artificial knee joint: Secondary | ICD-10-CM | POA: Diagnosis not present

## 2016-03-04 DIAGNOSIS — F339 Major depressive disorder, recurrent, unspecified: Secondary | ICD-10-CM | POA: Diagnosis present

## 2016-03-04 DIAGNOSIS — G8918 Other acute postprocedural pain: Secondary | ICD-10-CM

## 2016-03-04 DIAGNOSIS — K5900 Constipation, unspecified: Secondary | ICD-10-CM | POA: Diagnosis present

## 2016-03-04 DIAGNOSIS — M25561 Pain in right knee: Secondary | ICD-10-CM | POA: Diagnosis not present

## 2016-03-04 DIAGNOSIS — M1711 Unilateral primary osteoarthritis, right knee: Secondary | ICD-10-CM | POA: Diagnosis not present

## 2016-03-04 DIAGNOSIS — E559 Vitamin D deficiency, unspecified: Secondary | ICD-10-CM | POA: Diagnosis present

## 2016-03-04 DIAGNOSIS — F419 Anxiety disorder, unspecified: Secondary | ICD-10-CM | POA: Diagnosis present

## 2016-03-04 DIAGNOSIS — Z833 Family history of diabetes mellitus: Secondary | ICD-10-CM

## 2016-03-04 DIAGNOSIS — R262 Difficulty in walking, not elsewhere classified: Secondary | ICD-10-CM

## 2016-03-04 DIAGNOSIS — R413 Other amnesia: Secondary | ICD-10-CM | POA: Diagnosis present

## 2016-03-04 DIAGNOSIS — M48061 Spinal stenosis, lumbar region without neurogenic claudication: Secondary | ICD-10-CM | POA: Diagnosis present

## 2016-03-04 DIAGNOSIS — Z8249 Family history of ischemic heart disease and other diseases of the circulatory system: Secondary | ICD-10-CM

## 2016-03-04 DIAGNOSIS — K582 Mixed irritable bowel syndrome: Secondary | ICD-10-CM | POA: Diagnosis present

## 2016-03-04 DIAGNOSIS — R7303 Prediabetes: Secondary | ICD-10-CM | POA: Diagnosis not present

## 2016-03-04 DIAGNOSIS — F32A Depression, unspecified: Secondary | ICD-10-CM | POA: Diagnosis present

## 2016-03-04 HISTORY — PX: TOTAL KNEE ARTHROPLASTY: SHX125

## 2016-03-04 SURGERY — ARTHROPLASTY, KNEE, TOTAL
Anesthesia: Spinal | Laterality: Right

## 2016-03-04 MED ORDER — PROPOFOL 500 MG/50ML IV EMUL
INTRAVENOUS | Status: DC | PRN
  Administered 2016-03-04: 75 ug/kg/min via INTRAVENOUS
  Administered 2016-03-04: 11:00:00 via INTRAVENOUS

## 2016-03-04 MED ORDER — PROMETHAZINE HCL 25 MG/ML IJ SOLN: 6.2500 mg | INTRAMUSCULAR | Status: DC | PRN

## 2016-03-04 MED ORDER — ONDANSETRON HCL 4 MG/2ML IJ SOLN: 4.0000 mg | Freq: Four times a day (QID) | INTRAMUSCULAR | Status: DC | PRN

## 2016-03-04 MED ORDER — PHENOL 1.4 % MT LIQD: 1.0000 | OROMUCOSAL | Status: DC | PRN

## 2016-03-04 MED ORDER — ACETAMINOPHEN 650 MG RE SUPP: 650.0000 mg | Freq: Four times a day (QID) | RECTAL | Status: DC | PRN

## 2016-03-04 MED ORDER — METOCLOPRAMIDE HCL 5 MG/ML IJ SOLN: 5.0000 mg | Freq: Three times a day (TID) | INTRAMUSCULAR | Status: DC | PRN

## 2016-03-04 MED ORDER — 0.9 % SODIUM CHLORIDE (POUR BTL) OPTIME
TOPICAL | Status: DC | PRN
  Administered 2016-03-04: 1000 mL

## 2016-03-04 MED ORDER — HYDROMORPHONE HCL 2 MG/ML IJ SOLN
0.5000 mg | INTRAMUSCULAR | Status: DC | PRN
  Administered 2016-03-04: 1 mg via INTRAVENOUS
  Filled 2016-03-04: qty 1

## 2016-03-04 MED ORDER — LACTATED RINGERS IV SOLN: INTRAVENOUS | Status: DC

## 2016-03-04 MED ORDER — FENTANYL CITRATE (PF) 100 MCG/2ML IJ SOLN
INTRAMUSCULAR | Status: AC
  Filled 2016-03-04: qty 2

## 2016-03-04 MED ORDER — POVIDONE-IODINE 7.5 % EX SOLN
Freq: Once | CUTANEOUS | Status: DC
  Filled 2016-03-04: qty 118

## 2016-03-04 MED ORDER — HYDROMORPHONE HCL 1 MG/ML IJ SOLN: 0.2500 mg | INTRAMUSCULAR | Status: DC | PRN

## 2016-03-04 MED ORDER — DOCUSATE SODIUM 100 MG PO CAPS
100.0000 mg | ORAL_CAPSULE | Freq: Two times a day (BID) | ORAL | Status: DC
  Administered 2016-03-04 – 2016-03-07 (×7): 100 mg via ORAL
  Filled 2016-03-04 (×7): qty 1

## 2016-03-04 MED ORDER — CALCIUM CARBONATE ANTACID 500 MG PO CHEW: 2.0000 | CHEWABLE_TABLET | Freq: Every day | ORAL | Status: DC | PRN

## 2016-03-04 MED ORDER — LACTATED RINGERS IV SOLN
INTRAVENOUS | Status: DC
  Administered 2016-03-04 (×2): via INTRAVENOUS

## 2016-03-04 MED ORDER — BUPIVACAINE-EPINEPHRINE (PF) 0.5% -1:200000 IJ SOLN
INTRAMUSCULAR | Status: DC | PRN
  Administered 2016-03-04: 30 mL via PERINEURAL

## 2016-03-04 MED ORDER — KETOROLAC TROMETHAMINE 30 MG/ML IJ SOLN: 30.0000 mg | Freq: Once | INTRAMUSCULAR | Status: DC

## 2016-03-04 MED ORDER — LOSARTAN POTASSIUM 50 MG PO TABS
100.0000 mg | ORAL_TABLET | Freq: Every day | ORAL | Status: DC
  Administered 2016-03-05 – 2016-03-07 (×3): 100 mg via ORAL
  Filled 2016-03-04 (×3): qty 2

## 2016-03-04 MED ORDER — PROPOFOL 10 MG/ML IV BOLUS
INTRAVENOUS | Status: AC
  Filled 2016-03-04: qty 20

## 2016-03-04 MED ORDER — ACETAMINOPHEN 500 MG PO TABS
1000.0000 mg | ORAL_TABLET | Freq: Four times a day (QID) | ORAL | Status: AC
  Administered 2016-03-04 – 2016-03-05 (×4): 1000 mg via ORAL
  Filled 2016-03-04 (×4): qty 2

## 2016-03-04 MED ORDER — METOCLOPRAMIDE HCL 5 MG PO TABS: 5.0000 mg | ORAL_TABLET | Freq: Three times a day (TID) | ORAL | Status: DC | PRN

## 2016-03-04 MED ORDER — TRAZODONE HCL 100 MG PO TABS
100.0000 mg | ORAL_TABLET | Freq: Every day | ORAL | Status: DC
  Administered 2016-03-04 – 2016-03-06 (×3): 100 mg via ORAL
  Filled 2016-03-04 (×3): qty 1

## 2016-03-04 MED ORDER — MEPERIDINE HCL 25 MG/ML IJ SOLN: 6.2500 mg | INTRAMUSCULAR | Status: DC | PRN

## 2016-03-04 MED ORDER — ACETAMINOPHEN 325 MG PO TABS
650.0000 mg | ORAL_TABLET | Freq: Four times a day (QID) | ORAL | Status: DC | PRN
  Administered 2016-03-05 – 2016-03-06 (×2): 650 mg via ORAL
  Filled 2016-03-04 (×2): qty 2

## 2016-03-04 MED ORDER — MENTHOL 3 MG MT LOZG: 1.0000 | LOZENGE | OROMUCOSAL | Status: DC | PRN

## 2016-03-04 MED ORDER — AMLODIPINE BESYLATE 5 MG PO TABS
5.0000 mg | ORAL_TABLET | Freq: Every day | ORAL | Status: DC
  Administered 2016-03-06 – 2016-03-07 (×2): 5 mg via ORAL
  Filled 2016-03-04 (×2): qty 1

## 2016-03-04 MED ORDER — HYDROCHLOROTHIAZIDE 25 MG PO TABS
25.0000 mg | ORAL_TABLET | Freq: Every day | ORAL | Status: DC
  Administered 2016-03-05 – 2016-03-07 (×3): 25 mg via ORAL
  Filled 2016-03-04 (×3): qty 1

## 2016-03-04 MED ORDER — GABAPENTIN 300 MG PO CAPS
900.0000 mg | ORAL_CAPSULE | Freq: Three times a day (TID) | ORAL | Status: DC
  Administered 2016-03-04 – 2016-03-07 (×9): 900 mg via ORAL
  Filled 2016-03-04 (×9): qty 3

## 2016-03-04 MED ORDER — CHLORHEXIDINE GLUCONATE 4 % EX LIQD: 60.0000 mL | Freq: Once | CUTANEOUS | Status: DC

## 2016-03-04 MED ORDER — BUPIVACAINE HCL (PF) 0.25 % IJ SOLN
INTRAMUSCULAR | Status: AC
  Filled 2016-03-04: qty 30

## 2016-03-04 MED ORDER — SIMVASTATIN 40 MG PO TABS
40.0000 mg | ORAL_TABLET | Freq: Every day | ORAL | Status: DC
  Administered 2016-03-04 – 2016-03-06 (×3): 40 mg via ORAL
  Filled 2016-03-04 (×3): qty 1

## 2016-03-04 MED ORDER — FENTANYL CITRATE (PF) 100 MCG/2ML IJ SOLN
50.0000 ug | Freq: Once | INTRAMUSCULAR | Status: AC
  Administered 2016-03-04: 50 ug via INTRAVENOUS

## 2016-03-04 MED ORDER — EPINEPHRINE PF 1 MG/ML IJ SOLN
INTRAMUSCULAR | Status: AC
  Filled 2016-03-04: qty 1

## 2016-03-04 MED ORDER — MIDAZOLAM HCL 2 MG/2ML IJ SOLN
1.0000 mg | Freq: Once | INTRAMUSCULAR | Status: AC
  Administered 2016-03-04: 1 mg via INTRAVENOUS

## 2016-03-04 MED ORDER — POTASSIUM CHLORIDE IN NACL 20-0.9 MEQ/L-% IV SOLN
INTRAVENOUS | Status: DC
  Administered 2016-03-04 – 2016-03-05 (×2): via INTRAVENOUS
  Filled 2016-03-04 (×2): qty 1000

## 2016-03-04 MED ORDER — DEXAMETHASONE SODIUM PHOSPHATE 10 MG/ML IJ SOLN
10.0000 mg | Freq: Three times a day (TID) | INTRAMUSCULAR | Status: AC
  Administered 2016-03-04 – 2016-03-05 (×4): 10 mg via INTRAVENOUS
  Filled 2016-03-04 (×4): qty 1

## 2016-03-04 MED ORDER — POLYETHYLENE GLYCOL 3350 17 G PO PACK
17.0000 g | PACK | Freq: Two times a day (BID) | ORAL | Status: DC
  Administered 2016-03-04 – 2016-03-07 (×6): 17 g via ORAL
  Filled 2016-03-04 (×6): qty 1

## 2016-03-04 MED ORDER — BUPIVACAINE IN DEXTROSE 0.75-8.25 % IT SOLN
INTRATHECAL | Status: DC | PRN
  Administered 2016-03-04: 1.8 mL via INTRATHECAL

## 2016-03-04 MED ORDER — DULOXETINE HCL 60 MG PO CPEP
120.0000 mg | ORAL_CAPSULE | Freq: Every day | ORAL | Status: DC
  Administered 2016-03-05 – 2016-03-07 (×3): 120 mg via ORAL
  Filled 2016-03-04 (×3): qty 2

## 2016-03-04 MED ORDER — BUPIVACAINE-EPINEPHRINE 0.25% -1:200000 IJ SOLN
INTRAMUSCULAR | Status: DC | PRN
  Administered 2016-03-04: 30 mL

## 2016-03-04 MED ORDER — DEXAMETHASONE SODIUM PHOSPHATE 4 MG/ML IJ SOLN
INTRAMUSCULAR | Status: DC | PRN
  Administered 2016-03-04: 10 mg via INTRAVENOUS

## 2016-03-04 MED ORDER — OXYBUTYNIN CHLORIDE ER 10 MG PO TB24
10.0000 mg | ORAL_TABLET | Freq: Every day | ORAL | Status: DC
  Administered 2016-03-05 – 2016-03-07 (×3): 10 mg via ORAL
  Filled 2016-03-04 (×3): qty 1

## 2016-03-04 MED ORDER — SODIUM CHLORIDE 0.9 % IR SOLN
Status: DC | PRN
  Administered 2016-03-04: 3000 mL

## 2016-03-04 MED ORDER — CALCIUM CARBONATE-VITAMIN D3 600-400 MG-UNIT PO TABS: 1.0000 | ORAL_TABLET | Freq: Every day | ORAL | Status: DC

## 2016-03-04 MED ORDER — MONTELUKAST SODIUM 10 MG PO TABS
10.0000 mg | ORAL_TABLET | Freq: Every day | ORAL | Status: DC
  Administered 2016-03-05 – 2016-03-07 (×3): 10 mg via ORAL
  Filled 2016-03-04 (×3): qty 1

## 2016-03-04 MED ORDER — PROPOFOL 1000 MG/100ML IV EMUL
INTRAVENOUS | Status: AC
  Filled 2016-03-04: qty 200

## 2016-03-04 MED ORDER — OXYCODONE HCL 5 MG PO TABS
5.0000 mg | ORAL_TABLET | ORAL | Status: DC | PRN
  Administered 2016-03-04 (×2): 10 mg via ORAL
  Administered 2016-03-04: 5 mg via ORAL
  Administered 2016-03-05 – 2016-03-07 (×13): 10 mg via ORAL
  Filled 2016-03-04 (×16): qty 2

## 2016-03-04 MED ORDER — CALCIUM CARBONATE-VITAMIN D 500-200 MG-UNIT PO TABS
1.0000 | ORAL_TABLET | Freq: Every day | ORAL | Status: DC
  Administered 2016-03-05 – 2016-03-07 (×3): 1 via ORAL
  Filled 2016-03-04 (×3): qty 1

## 2016-03-04 MED ORDER — LOSARTAN POTASSIUM-HCTZ 100-25 MG PO TABS: 1.0000 | ORAL_TABLET | Freq: Every day | ORAL | Status: DC

## 2016-03-04 MED ORDER — DIPHENHYDRAMINE HCL 12.5 MG/5ML PO ELIX: 12.5000 mg | ORAL_SOLUTION | ORAL | Status: DC | PRN

## 2016-03-04 MED ORDER — PHENYLEPHRINE HCL 10 MG/ML IJ SOLN
INTRAMUSCULAR | Status: DC | PRN
  Administered 2016-03-04: 80 ug via INTRAVENOUS

## 2016-03-04 MED ORDER — FLUTICASONE PROPIONATE 50 MCG/ACT NA SUSP: 2.0000 | Freq: Every day | NASAL | Status: DC

## 2016-03-04 MED ORDER — ASPIRIN EC 325 MG PO TBEC
325.0000 mg | DELAYED_RELEASE_TABLET | Freq: Every day | ORAL | Status: DC
  Administered 2016-03-05 – 2016-03-07 (×3): 325 mg via ORAL
  Filled 2016-03-04 (×3): qty 1

## 2016-03-04 MED ORDER — PHENYLEPHRINE HCL 10 MG/ML IJ SOLN
INTRAVENOUS | Status: DC | PRN
  Administered 2016-03-04: 20 ug/min via INTRAVENOUS

## 2016-03-04 MED ORDER — ONDANSETRON HCL 4 MG PO TABS: 4.0000 mg | ORAL_TABLET | Freq: Four times a day (QID) | ORAL | Status: DC | PRN

## 2016-03-04 MED ORDER — VITAMIN D 1000 UNITS PO TABS
5000.0000 [IU] | ORAL_TABLET | Freq: Every day | ORAL | Status: DC
Start: 2016-03-05 — End: 2016-03-07
  Administered 2016-03-05 – 2016-03-07 (×3): 5000 [IU] via ORAL
  Filled 2016-03-04 (×3): qty 5

## 2016-03-04 MED ORDER — MIDAZOLAM HCL 2 MG/2ML IJ SOLN
INTRAMUSCULAR | Status: AC
  Filled 2016-03-04: qty 2

## 2016-03-04 MED ORDER — ALUM & MAG HYDROXIDE-SIMETH 200-200-20 MG/5ML PO SUSP: 30.0000 mL | ORAL | Status: DC | PRN

## 2016-03-04 MED ORDER — VANCOMYCIN HCL IN DEXTROSE 1-5 GM/200ML-% IV SOLN
1000.0000 mg | Freq: Two times a day (BID) | INTRAVENOUS | Status: AC
  Administered 2016-03-04: 1000 mg via INTRAVENOUS
  Filled 2016-03-04: qty 200

## 2016-03-04 SURGICAL SUPPLY — 77 items
APL SKNCLS STERI-STRIP NONHPOA (GAUZE/BANDAGES/DRESSINGS) ×1
BANDAGE ELASTIC 6 VELCRO ST LF (GAUZE/BANDAGES/DRESSINGS) ×2 IMPLANT
BANDAGE ESMARK 6X9 LF (GAUZE/BANDAGES/DRESSINGS) ×1 IMPLANT
BENZOIN TINCTURE PRP APPL 2/3 (GAUZE/BANDAGES/DRESSINGS) ×3 IMPLANT
BLADE SAGITTAL 25.0X1.19X90 (BLADE) ×2 IMPLANT
BLADE SAGITTAL 25.0X1.19X90MM (BLADE) ×1
BLADE SAW SGTL 13X75X1.27 (BLADE) ×3 IMPLANT
BLADE SURG 10 STRL SS (BLADE) ×6 IMPLANT
BNDG CMPR 9X6 STRL LF SNTH (GAUZE/BANDAGES/DRESSINGS) ×1
BNDG CMPR MED 15X6 ELC VLCR LF (GAUZE/BANDAGES/DRESSINGS) ×1
BNDG ELASTIC 6X15 VLCR STRL LF (GAUZE/BANDAGES/DRESSINGS) ×3 IMPLANT
BNDG ESMARK 6X9 LF (GAUZE/BANDAGES/DRESSINGS) ×3
BOWL SMART MIX CTS (DISPOSABLE) ×3 IMPLANT
CAPT KNEE TOTAL 3 ATTUNE ×2 IMPLANT
CEMENT HV SMART SET (Cement) ×6 IMPLANT
CLOSURE STERI-STRIP 1/2X4 (GAUZE/BANDAGES/DRESSINGS) ×1
CLOSURE WOUND 1/2 X4 (GAUZE/BANDAGES/DRESSINGS) ×1
CLSR STERI-STRIP ANTIMIC 1/2X4 (GAUZE/BANDAGES/DRESSINGS) ×1 IMPLANT
COVER SURGICAL LIGHT HANDLE (MISCELLANEOUS) ×5 IMPLANT
CUFF TOURNIQUET SINGLE 34IN LL (TOURNIQUET CUFF) ×3 IMPLANT
CUFF TOURNIQUET SINGLE 44IN (TOURNIQUET CUFF) IMPLANT
DECANTER SPIKE VIAL GLASS SM (MISCELLANEOUS) ×3 IMPLANT
DRAPE EXTREMITY T 121X128X90 (DRAPE) ×3 IMPLANT
DRAPE HALF SHEET 40X57 (DRAPES) ×3 IMPLANT
DRAPE INCISE IOBAN 66X45 STRL (DRAPES) IMPLANT
DRAPE PROXIMA HALF (DRAPES) ×3 IMPLANT
DRAPE U-SHAPE 47X51 STRL (DRAPES) ×3 IMPLANT
DRSG AQUACEL AG ADV 3.5X10 (GAUZE/BANDAGES/DRESSINGS) ×2 IMPLANT
DRSG AQUACEL AG ADV 3.5X14 (GAUZE/BANDAGES/DRESSINGS) ×3 IMPLANT
DURAPREP 26ML APPLICATOR (WOUND CARE) ×6 IMPLANT
ELECT CAUTERY BLADE 6.4 (BLADE) ×3 IMPLANT
ELECT REM PT RETURN 9FT ADLT (ELECTROSURGICAL) ×3
ELECTRODE REM PT RTRN 9FT ADLT (ELECTROSURGICAL) ×1 IMPLANT
FACESHIELD WRAPAROUND (MASK) ×3 IMPLANT
FACESHIELD WRAPAROUND OR TEAM (MASK) ×1 IMPLANT
GLOVE BIO SURGEON STRL SZ7 (GLOVE) ×3 IMPLANT
GLOVE BIOGEL PI IND STRL 7.0 (GLOVE) ×1 IMPLANT
GLOVE BIOGEL PI IND STRL 7.5 (GLOVE) ×1 IMPLANT
GLOVE BIOGEL PI INDICATOR 7.0 (GLOVE) ×2
GLOVE BIOGEL PI INDICATOR 7.5 (GLOVE) ×2
GLOVE SS BIOGEL STRL SZ 7.5 (GLOVE) ×1 IMPLANT
GLOVE SUPERSENSE BIOGEL SZ 7.5 (GLOVE) ×2
GOWN STRL REUS W/ TWL LRG LVL3 (GOWN DISPOSABLE) ×1 IMPLANT
GOWN STRL REUS W/ TWL XL LVL3 (GOWN DISPOSABLE) ×2 IMPLANT
GOWN STRL REUS W/TWL LRG LVL3 (GOWN DISPOSABLE) ×3
GOWN STRL REUS W/TWL XL LVL3 (GOWN DISPOSABLE) ×6
HANDPIECE INTERPULSE COAX TIP (DISPOSABLE) ×3
HOOD PEEL AWAY FACE SHEILD DIS (HOOD) ×8 IMPLANT
IMMOBILIZER KNEE 22 UNIV (SOFTGOODS) ×3 IMPLANT
KIT BASIN OR (CUSTOM PROCEDURE TRAY) ×3 IMPLANT
KIT ROOM TURNOVER OR (KITS) ×3 IMPLANT
MANIFOLD NEPTUNE II (INSTRUMENTS) ×3 IMPLANT
MARKER SKIN DUAL TIP RULER LAB (MISCELLANEOUS) ×5 IMPLANT
NDL 18GX1X1/2 (RX/OR ONLY) (NEEDLE) ×1 IMPLANT
NEEDLE 18GX1X1/2 (RX/OR ONLY) (NEEDLE) ×3 IMPLANT
NS IRRIG 1000ML POUR BTL (IV SOLUTION) ×3 IMPLANT
PACK TOTAL JOINT (CUSTOM PROCEDURE TRAY) ×3 IMPLANT
PAD ARMBOARD 7.5X6 YLW CONV (MISCELLANEOUS) ×4 IMPLANT
SET HNDPC FAN SPRY TIP SCT (DISPOSABLE) ×1 IMPLANT
STRIP CLOSURE SKIN 1/2X4 (GAUZE/BANDAGES/DRESSINGS) ×2 IMPLANT
SUCTION FRAZIER HANDLE 10FR (MISCELLANEOUS) ×2
SUCTION TUBE FRAZIER 10FR DISP (MISCELLANEOUS) ×1 IMPLANT
SUT MNCRL AB 3-0 PS2 18 (SUTURE) ×3 IMPLANT
SUT VIC AB 0 CT1 27 (SUTURE) ×6
SUT VIC AB 0 CT1 27XBRD ANBCTR (SUTURE) ×2 IMPLANT
SUT VIC AB 1 CT1 27 (SUTURE) ×3
SUT VIC AB 1 CT1 27XBRD ANBCTR (SUTURE) ×1 IMPLANT
SUT VIC AB 2-0 CT1 27 (SUTURE) ×6
SUT VIC AB 2-0 CT1 TAPERPNT 27 (SUTURE) ×2 IMPLANT
SYR 30ML LL (SYRINGE) ×3 IMPLANT
TOWEL OR 17X24 6PK STRL BLUE (TOWEL DISPOSABLE) ×3 IMPLANT
TOWEL OR 17X26 10 PK STRL BLUE (TOWEL DISPOSABLE) ×3 IMPLANT
TRAY CATH 16FR W/PLASTIC CATH (SET/KITS/TRAYS/PACK) IMPLANT
TRAY FOLEY CATH 16FR SILVER (SET/KITS/TRAYS/PACK) ×3 IMPLANT
TUBE CONNECTING 12'X1/4 (SUCTIONS) ×1
TUBE CONNECTING 12X1/4 (SUCTIONS) ×2 IMPLANT
YANKAUER SUCT BULB TIP NO VENT (SUCTIONS) ×3 IMPLANT

## 2016-03-04 NOTE — Anesthesia Procedure Notes (Signed)
Procedure Name: MAC Date/Time: 03/04/2016 9:37 AM Performed by: Oletta Lamas Pre-anesthesia Checklist: Emergency Drugs available, Patient being monitored, Suction available and Patient identified Patient Re-evaluated:Patient Re-evaluated prior to inductionOxygen Delivery Method: Nasal cannula

## 2016-03-04 NOTE — Progress Notes (Signed)
Report given to CRNA [facility]

## 2016-03-04 NOTE — Transfer of Care (Signed)
Immediate Anesthesia Transfer of Care Note  Patient: Shelly Hickman  Procedure(s) Performed: Procedure(s): TOTAL KNEE ARTHROPLASTY (Right)  Patient Location: PACU  Anesthesia Type:MAC, Spinal and MAC combined with regional for post-op pain  Level of Consciousness: awake, alert , oriented and patient cooperative  Airway & Oxygen Therapy: Patient Spontanous Breathing and Patient connected to nasal cannula oxygen  Post-op Assessment: Report given to RN and Post -op Vital signs reviewed and stable  Post vital signs: Reviewed and stable  Last Vitals:  Vitals:   03/04/16 0910 03/04/16 0915  BP: (!) 120/49   Pulse: 72 74  Resp: 16 (!) 26    Last Pain:  Vitals:   03/04/16 0759  TempSrc: Oral  PainSc:       Patients Stated Pain Goal: 3 (00/51/10 2111)  Complications: No apparent anesthesia complications

## 2016-03-04 NOTE — Anesthesia Procedure Notes (Signed)
Spinal  Patient location during procedure: OR Start time: 03/04/2016 9:43 AM End time: 03/04/2016 9:46 AM Staffing Anesthesiologist: Lyn Hollingshead Performed: anesthesiologist  Preanesthetic Checklist Completed: patient identified, surgical consent, pre-op evaluation, timeout performed, IV checked, risks and benefits discussed and monitors and equipment checked Spinal Block Patient position: sitting Prep: site prepped and draped and DuraPrep Patient monitoring: heart rate, cardiac monitor, continuous pulse ox and blood pressure Approach: midline Location: L3-4 Injection technique: single-shot Needle Needle type: Sprotte  Needle gauge: 24 G Needle length: 9 cm Needle insertion depth: 5 cm Assessment Sensory level: T8

## 2016-03-04 NOTE — Progress Notes (Signed)
Orthopedic Tech Progress Note Patient Details:  Shelly Hickman 01-25-1941 [id number]  CPM Right Knee CPM Right Knee: On Right Knee Flexion (Degrees): 90 Right Knee Extension (Degrees): 0 Additional Comments: Applied CPM at 0-90 On Right leg/knee. Provided Zero degree bone foam as requested. Nurse at bed side Eliezer Lofts).  Pt Tolerated Well.    Kristopher Oppenheim 03/04/2016, 12:47 PM

## 2016-03-04 NOTE — Anesthesia Procedure Notes (Addendum)
Anesthesia Regional Block:  Adductor canal block  Pre-Anesthetic Checklist: ,, timeout performed, Correct Patient, Correct Site, Correct Laterality, Correct Procedure, Correct Position, site marked, Risks and benefits discussed,  Surgical consent,  Pre-op evaluation,  At surgeon's request and post-op pain management  Laterality: Right  Prep: chloraprep       Needles:   Needle Type: Echogenic Stimulator Needle     Needle Length: 9cm 9 cm Needle Gauge: 21 and 21 G    Additional Needles:  Procedures: ultrasound guided (picture in chart) Adductor canal block Narrative:  Start time: 03/04/2016 9:05 AM End time: 03/04/2016 9:12 AM Injection made incrementally with aspirations every 5 mL. Anesthesiologist: Lyn Hollingshead

## 2016-03-04 NOTE — Op Note (Signed)
MRN:     [id number] DOB/AGE:    19-Feb-1940 / 76 y.o.       OPERATIVE REPORT    DATE OF PROCEDURE:  03/04/2016       PREOPERATIVE DIAGNOSIS:   PRIMARY LOCALIZED OA RIGHT KNEE      Estimated body mass index is 34.7 kg/m as calculated from the following:   Height as of this encounter: 5\' 6"  (1.676 m).   Weight as of this encounter: 215 lb (97.5 kg).                                                        POSTOPERATIVE DIAGNOSIS:   SAME                                                                     PROCEDURE:  Procedure(s): TOTAL KNEE ARTHROPLASTY Using Depuy Attune RP implants #5 Femur, #4Tibia, 40mm  RP bearing, 20 Patella     SURGEON: Nica Friske A    ASSISTANT:  Kirstin Shepperson PA-C   (Present and scrubbed throughout the case, critical for assistance with exposure, retraction, instrumentation, and closure.)         ANESTHESIA: Spinal with Adductor Nerve Block     TOURNIQUET TIME: 0000000   COMPLICATIONS:  None     SPECIMENS: None   INDICATIONS FOR PROCEDURE: The patient has  DJD RIGHT KNEE, varus deformities, XR shows bone on bone arthritis. Patient has failed all conservative measures including anti-inflammatory medicines, narcotics, attempts at  exercise and weight loss, cortisone injections and viscosupplementation.  Risks and benefits of surgery have been discussed, questions answered.   DESCRIPTION OF PROCEDURE: The patient identified by armband, received  right femoral nerve block and IV antibiotics, in the holding area at [facility]. Patient taken to the operating room, appropriate anesthetic  monitors were attached Spinal anesthesia induced with  the patient in supine position, Foley catheter was inserted. Tourniquet  applied high to the operative thigh. Lateral post and foot positioner  applied to the table, the lower extremity was then prepped and draped  in usual sterile fashion from the ankle to the tourniquet. Time-out procedure was performed. The limb  was wrapped with an Esmarch bandage and the tourniquet inflated to 365 mmHg. We began the operation by making the anterior midline incision starting at handbreadth above the patella going over the patella 1 cm medial to and  4 cm distal to the tibial tubercle. Small bleeders in the skin and the  subcutaneous tissue identified and cauterized. Transverse retinaculum was incised and reflected medially and a medial parapatellar arthrotomy was accomplished. the patella was everted and theprepatellar fat pad resected. The superficial medial collateral  ligament was then elevated from anterior to posterior along the proximal  flare of the tibia and anterior half of the menisci resected. The knee was hyperflexed exposing bone on bone arthritis. Peripheral and notch osteophytes as well as the cruciate ligaments were then resected. We continued to  work our way around posteriorly along the proximal tibia, and externally  rotated the tibia subluxing it out from  underneath the femur. A McHale  retractor was placed through the notch and a lateral Hohmann retractor  placed, and we then drilled through the proximal tibia in line with the  axis of the tibia followed by an intramedullary guide rod and 2-degree  posterior slope cutting guide. The tibial cutting guide was pinned into place  allowing resection of 6 mm of bone medially and about 4 mm of bone  laterally because of her valgus deformity. Satisfied with the tibial resection, we then  entered the distal femur 2 mm anterior to the PCL origin with the  intramedullary guide rod and applied the distal femoral cutting guide  set at 44mm, with 5 degrees of valgus. This was pinned along the  epicondylar axis. At this point, the distal femoral cut was accomplished without difficulty. We then sized for a #5 femoral component and pinned the guide in 3 degrees of external rotation.The chamfer cutting guide was pinned into place. The anterior, posterior, and chamfer cuts  were accomplished without difficulty followed by  the  RP box cutting guide and the box cut. We also removed posterior osteophytes from the posterior femoral condyles. At this  time, the knee was brought into full extension. We checked our  extension and flexion gaps and found them symmetric at 42mm.  The patella thickness measured at 21 mm. We set the cutting guide at 13 and removed the posterior 8 mm  of the patella sized for 29 button and drilled the lollipop. The knee  was then once again hyperflexed exposing the proximal tibia. We sized for a #4 tibial base plate, applied the smokestack and the conical reamer followed by the the Delta fin keel punch. We then hammered into place the  RP trial femoral component, inserted a 1 trial bearing, trial patellar button, and took the knee through range of motion from 0-130 degrees. No thumb pressure was required for patellar  tracking. At this point, all trial components were removed, a double batch of DePuy HV cement  was mixed and applied to all bony metallic mating surfaces except for the posterior condyles of the femur itself. In order, we  hammered into place the tibial tray and removed excess cement, the femoral component and removed excess cement, a 31mm  RP bearing  was inserted, and the knee brought to full extension with compression.  The patellar button was clamped into place, and excess cement  removed. While the cement cured the wound was irrigated out with normal saline solution pulse lavage.. Ligament stability and patellar tracking were checked and found to be excellent.. The parapatellar arthrotomy was closed with  #1 Vicryl suture. The subcutaneous tissue with 0 and 2-0 undyed  Vicryl suture, and 4-0 Monocryl.. A dressing of Aquaseal,  4 x 4, dressing sponges, Webril, and Ace wrap applied. Needle and sponge count were correct times 2.The patient awakened, extubated, and taken to recovery room without difficulty. Vascular status was normal,  pulses 2+ and symmetric.   Shelly Hickman A 03/04/2016, 11:21 AM

## 2016-03-04 NOTE — Anesthesia Postprocedure Evaluation (Addendum)
Anesthesia Post Note  Patient: Shelly Hickman  Procedure(s) Performed: Procedure(s) (LRB): TOTAL KNEE ARTHROPLASTY (Right)  Patient location during evaluation: PACU Anesthesia Type: Spinal Level of consciousness: awake Pain management: pain level controlled Vital Signs Assessment: post-procedure vital signs reviewed and stable Respiratory status: spontaneous breathing Cardiovascular status: stable Postop Assessment: no headache, no backache, spinal receding, patient able to bend at knees and no signs of nausea or vomiting Anesthetic complications: no        Last Vitals:  Vitals:   03/04/16 1300 03/04/16 1305  BP:    Pulse: 72 74  Resp: (!) 26 (!) 25  Temp:  36.4 C    Last Pain:  Vitals:   03/04/16 1230  TempSrc:   PainSc: 0-No pain   Pain Goal: Patients Stated Pain Goal: 3 (03/04/16 0749)               Kiki Bivens JR,JOHN Mateo Flow

## 2016-03-04 NOTE — Evaluation (Signed)
Physical Therapy Evaluation Patient Details Name: Shelly Hickman MRN: [id number] DOB: 1940-09-07 Today's Date: 03/04/2016   History of Present Illness  76 y.o. female admitted to [facility] on 03/04/16 for elective R TKA.  Pt with significant PMHx of L THA, neuromuscular d/o, urinary incontinence, HTN, anemia, multiple back surgeries, carpal tunnel release.   Clinical Impression  Pt is POD #0 and is mod assist to get to standing from elevated bed. She reported this was difficult for her at baseline.  Once up she is min assist for short distance gait in room with RW.  She will likely progress well enough to d/c home with HHPT f/u at discharge.   PT to follow acutely for deficits listed below.       Follow Up Recommendations Home health PT;Supervision for mobility/OOB    Equipment Recommendations  None recommended by PT    Recommendations for Other Services   NA    Precautions / Restrictions Precautions Precautions: Knee Precaution Booklet Issued: Yes (comment) Precaution Comments: knee exercise handout given and reciewed.  Restrictions Weight Bearing Restrictions: Yes RLE Weight Bearing: Weight bearing as tolerated      Mobility  Bed Mobility Overal bed mobility: Modified Independent             General bed mobility comments: Pt able to get EOB with HOB elevated and use of the railing.  She is able to progress her right leg to EOB unassisted.   Transfers Overall transfer level: Needs assistance Equipment used: Rolling walker (2 wheeled) Transfers: Sit to/from [facility] Sit to Stand: From elevated surface;Mod assist Stand pivot transfers: From elevated surface;Mod assist       General transfer comment: Mod assist to support trunk during transition to stand.  Multiple attempts needed to get all the way up and from elevated height of bed.  Pt reports getting up was difficult for her at baseline.   Ambulation/Gait Ambulation/Gait assistance: Min  assist Ambulation Distance (Feet): 5 Feet Assistive device: Rolling walker (2 wheeled) Gait Pattern/deviations: Step-to pattern;Antalgic     General Gait Details: Verbal cues for safe RW use, upright posture, correcte LE sequencing.          Balance Overall balance assessment: Needs assistance Sitting-balance support: Feet supported;No upper extremity supported Sitting balance-Leahy Scale: Good     Standing balance support: Bilateral upper extremity supported Standing balance-Leahy Scale: Poor                               Pertinent Vitals/Pain Pain Assessment: 0-10 Pain Score: 5  Pain Location: right knee Pain Descriptors / Indicators: Aching;Burning Pain Intervention(s): Limited activity within patient's tolerance;Monitored during session;Repositioned    Home Living Family/patient expects to be discharged to:: Private residence Living Arrangements: Spouse/significant other Available Help at Discharge: Family;Available 24 hours/day;Other (Comment) (but can only provide supervision) Type of Home: House Home Access: Ramped entrance     Home Layout: One level Home Equipment: Walker - 2 wheels;Cane - single point;Toilet riser;Grab bars - tub/shower;Hand held shower head      Prior Function Level of Independence: Independent                  Extremity/Trunk Assessment   Upper Extremity Assessment Upper Extremity Assessment: Defer to OT evaluation    Lower Extremity Assessment Lower Extremity Assessment: RLE deficits/detail RLE Deficits / Details: right leg with normal post op pain and weakness.  Ankle at least 3/5, knee  2/5, hip flexion 3/5    Cervical / Trunk Assessment Cervical / Trunk Assessment: Other exceptions Cervical / Trunk Exceptions: h/o multiple low back surgeries.   Communication   Communication: No difficulties  Cognition Arousal/Alertness: Awake/alert Behavior During Therapy: WFL for tasks assessed/performed Overall  Cognitive Status: Within Functional Limits for tasks assessed                         Exercises Total Joint Exercises Ankle Circles/Pumps: AROM;Both;20 reps Quad Sets:  (pt reports doing 30 quad sets while in bone foam earlier)   Assessment/Plan    PT Assessment Patient needs continued PT services  PT Problem List Decreased strength;Decreased range of motion;Decreased activity tolerance;Decreased balance;Decreased mobility;Decreased knowledge of use of DME;Pain;Decreased knowledge of precautions          PT Treatment Interventions DME instruction;Gait training;Stair training;Functional mobility training;Therapeutic activities;Therapeutic exercise;Balance training;Patient/family education;Manual techniques;Modalities    PT Goals (Current goals can be found in the Care Plan section)  Acute Rehab PT Goals Patient Stated Goal: to go home and do therapy at home PT Goal Formulation: With patient Time For Goal Achievement: 03/11/16 Potential to Achieve Goals: Good    Frequency 7X/week   Barriers to discharge Decreased caregiver support husband can only provide supervision       End of Session Equipment Utilized During Treatment: Gait belt;Right knee immobilizer Activity Tolerance: Patient limited by pain;Patient limited by fatigue Patient left: in chair;with call bell/phone within reach Nurse Communication: Mobility status         Time: 1737-1759 PT Time Calculation (min) (ACUTE ONLY): 22 min   Charges:   PT Evaluation $PT Eval Moderate Complexity: 1 Procedure          Yuma Blucher B. Oelrichs, [place], DPT [phone]   03/04/2016, 6:05 PM

## 2016-03-04 NOTE — Anesthesia Procedure Notes (Signed)
Performed by: Alyannah Sanks B       

## 2016-03-05 ENCOUNTER — Encounter (HOSPITAL_COMMUNITY): Payer: Self-pay | Admitting: Orthopedic Surgery

## 2016-03-05 LAB — URINE CULTURE

## 2016-03-05 LAB — BASIC METABOLIC PANEL
Anion gap: 7 (ref 5–15)
BUN: 16 mg/dL (ref 6–20)
CO2: 25 mmol/L (ref 22–32)
Calcium: 8.3 mg/dL — ABNORMAL LOW (ref 8.9–10.3)
Chloride: 102 mmol/L (ref 101–111)
Creatinine, Ser: 0.87 mg/dL (ref 0.44–1.00)
GFR calc Af Amer: >60 mL/min (ref >60)
GFR calc non Af Amer: >60 mL/min (ref >60)
Glucose, Bld: 194 mg/dL — ABNORMAL HIGH (ref 65–99)
Potassium: 4.3 mmol/L (ref 3.5–5.1)
Sodium: 134 mmol/L — ABNORMAL LOW (ref 135–145)

## 2016-03-05 LAB — CBC
HCT: 34.3 % — ABNORMAL LOW (ref 36.0–46.0)
Hemoglobin: 11.1 g/dL — ABNORMAL LOW (ref 12.0–15.0)
MCH: 28.7 pg (ref 26.0–34.0)
MCHC: 32.4 g/dL (ref 30.0–36.0)
MCV: 88.6 fL (ref 78.0–100.0)
Platelets: 262 10*3/uL (ref 150–400)
RBC: 3.87 MIL/uL (ref 3.87–5.11)
RDW: 14.9 % (ref 11.5–15.5)
WBC: 18.8 10*3/uL — ABNORMAL HIGH (ref 4.0–10.5)

## 2016-03-05 LAB — GLUCOSE, CAPILLARY
Glucose-Capillary: 223 mg/dL — ABNORMAL HIGH (ref 65–99)
Glucose-Capillary: 217 mg/dL — ABNORMAL HIGH (ref 65–99)

## 2016-03-05 MED ORDER — INSULIN ASPART 100 UNIT/ML ~~LOC~~ SOLN
0.0000 [IU] | Freq: Three times a day (TID) | SUBCUTANEOUS | Status: DC
  Administered 2016-03-05: 5 [IU] via SUBCUTANEOUS
  Administered 2016-03-06: 3 [IU] via SUBCUTANEOUS
  Administered 2016-03-06: 2 [IU] via SUBCUTANEOUS
  Administered 2016-03-06: 3 [IU] via SUBCUTANEOUS

## 2016-03-05 MED ORDER — INSULIN ASPART 100 UNIT/ML ~~LOC~~ SOLN: 0.0000 [IU] | Freq: Every day | SUBCUTANEOUS | Status: DC

## 2016-03-05 NOTE — Progress Notes (Signed)
Orthopedic Tech Progress Note Patient Details:  Shelly Hickman June 07, 1940 [id number]  Patient ID: [name], female   DOB: Jul 08, 1940, 76 y.o.   MRN: [id number]   Hildred Priest 03/05/2016, 1:55 PM Placed pt's rle on cpm @1400 ; RN notified @0 -70 degrees

## 2016-03-05 NOTE — Consult Note (Signed)
[facility] CM Primary Care Navigator  03/05/2016  Shelly Hickman [birth year] [id number]  Met with patient at the bedside to identify possible discharge needs. Patient reports having history of pain and functional disability in the right knee that failed non-surgical conservative treatments which had led to this admission/surgery.  Patient endorses Dr. Mellody Dance with [facility] as the primary care provider.    Patient shared using [facility] pharmacy to obtain medications without any problem.   Patient states that she manages her own medications at home using "pill box" system weekly.   She is able to drive prior to admission and mostly independent with self care . Husband Konrad Dolores) will provide transportation to her doctors' appointments after discharge.  Her husband will be the primary caregiver at home as stated.  Discharge plan is home with home health services as recommended, according to patient..  Patient voiced understanding to call primary care provider's office when she gets back home, for a post discharge follow-up appointment within a week or sooner if needs arise. Patient letter provided for her reminder.  Patient denies any other needs or concerns at this time.  For additional questions please contact:  Edwena Felty A. Nike Southers, BSN, RN-BC [facility] PRIMARY CARE Navigator Cell: [phone]

## 2016-03-05 NOTE — Progress Notes (Signed)
Subjective: 1 Day Post-Op Procedure(s) (LRB): TOTAL KNEE ARTHROPLASTY (Right) Patient reports pain as 6 on 0-10 scale.    Objective: Vital signs in last 24 hours: Temp:  [97.5 F (36.4 C)-98.1 F (36.7 C)] 97.9 F (36.6 C) (01/16 0557) Pulse Rate:  [72-87] 72 (01/16 0557) Resp:  [13-26] 18 (01/16 0557) BP: (109-149)/(48-84) 137/64 (01/16 0557) SpO2:  [92 %-96 %] 93 % (01/16 0557)  Intake/Output from previous day: 01/15 0701 - 01/16 0700 In: 2123.3 [P.O.:720; I.V.:1403.3] Out: 2100 [Urine:2000; Blood:100] Intake/Output this shift: No intake/output data recorded.   Recent Labs  03/05/16 0334  HGB 11.1*    Recent Labs  03/05/16 0334  WBC 18.8*  RBC 3.87  HCT 34.3*  PLT 262    Recent Labs  03/05/16 0334  NA 134*  K 4.3  CL 102  CO2 25  BUN 16  CREATININE 0.87  GLUCOSE 194*  CALCIUM 8.3*   No results for input(s): LABPT, INR in the last 72 hours.  ABD soft Neurovascular intact Sensation intact distally Intact pulses distally Dorsiflexion/Plantar flexion intact Incision: no drainage  Assessment/Plan: 1 Day Post-Op Procedure(s) (LRB): TOTAL KNEE ARTHROPLASTY (Right)  Principal Problem:   Primary localized osteoarthritis of right knee Active Problems:   Status post left hip replacement   Essential hypertension, benign   Constipation   Anxiety   Insomnia   Clinical depression   Degenerative joint disease involving multiple joints   Osteopenia   Memory impairment   Vitamin D deficiency   Prediabetes   Morbid obesity ([place])   Spinal stenosis in cervical region   Spinal stenosis of lumbar region  Advance diet Up with therapy D/C IV fluids Discharge home with home health on Thursday.  Patient has prediabetes.  I will add a sliding scale insulin.  Patient did OK with walking but is very weak getting out of bed and lacks the endurance to go home safely today.  Everard Interrante J 03/05/2016, 9:07 AM

## 2016-03-05 NOTE — Progress Notes (Signed)
Physical Therapy Treatment Patient Details Name: Shelly Hickman MRN: [id number] DOB: 21-Aug-1940 Today's Date: 03/05/2016    History of Present Illness 76 y.o. female admitted to [facility] on 03/04/16 for elective R TKA.  Pt with significant PMHx of L THA, neuromuscular d/o, urinary incontinence, HTN, anemia, multiple back surgeries, carpal tunnel release.     PT Comments    Patient continues to do well with mobility and tolerated increased gait distance this session. Current plan remains appropriate.   Follow Up Recommendations  Home health PT;Supervision for mobility/OOB     Equipment Recommendations  None recommended by PT    Recommendations for Other Services       Precautions / Restrictions Precautions Precautions: Knee Precaution Comments: reviewed nothing under knee, use of zero degree foam, and HEP Restrictions Weight Bearing Restrictions: Yes RLE Weight Bearing: Weight bearing as tolerated    Mobility  Bed Mobility Overal bed mobility: Modified Independent Bed Mobility: Sit to Supine       Sit to supine: Supervision   General bed mobility comments: increased time and effort  Transfers Overall transfer level: Needs assistance Equipment used: Rolling walker (2 wheeled) Transfers: Sit to/from Stand Sit to Stand: Min assist         General transfer comment: assist to power up into standing; cues for hand placement; pt uses momentum to stand but needs assist to lift hips from EOB  Ambulation/Gait Ambulation/Gait assistance: Min guard Ambulation Distance (Feet): 185 Feet Assistive device: Rolling walker (2 wheeled) Gait Pattern/deviations: Step-through pattern;Decreased stride length;Decreased weight shift to right;Antalgic Gait velocity: decreased   General Gait Details: cues for posture and relaxing shoulders as well as increased R knee flexion during swing phase; steady gait and mildly antalgic   Stairs            Wheelchair Mobility     Modified Rankin (Stroke Patients Only)       Balance Overall balance assessment: Needs assistance Sitting-balance support: Feet supported;No upper extremity supported Sitting balance-Leahy Scale: Good     Standing balance support: Single extremity supported Standing balance-Leahy Scale: Poor                      Cognition Arousal/Alertness: Awake/alert Behavior During Therapy: WFL for tasks assessed/performed Overall Cognitive Status: Within Functional Limits for tasks assessed                      Exercises      General Comments General comments (skin integrity, edema, etc.): husband present in room      Pertinent Vitals/Pain Pain Assessment: Faces Faces Pain Scale: Hurts little more Pain Location: right knee with flexion Pain Descriptors / Indicators: Grimacing;Sore Pain Intervention(s): Limited activity within patient's tolerance;Monitored during session;Premedicated before session;Repositioned    Home Living                      Prior Function            PT Goals (current goals can now be found in the care plan section) Acute Rehab PT Goals Patient Stated Goal: go home Progress towards PT goals: Progressing toward goals    Frequency    7X/week      PT Plan Current plan remains appropriate    Co-evaluation             End of Session Equipment Utilized During Treatment: Gait belt Activity Tolerance: Patient tolerated treatment well Patient left: with call bell/phone within  reach;in bed;in CPM     Time: [id number] PT Time Calculation (min) (ACUTE ONLY): 32 min  Charges:  $Gait Training: 8-22 mins $Therapeutic Activity: 8-22 mins                    G Codes:      Salina April, PTA Pager: [phone]   03/05/2016, 3:18 PM

## 2016-03-05 NOTE — Evaluation (Signed)
Occupational Therapy Evaluation Patient Details Name: Shelly Hickman MRN: [id number] DOB: Dec 22, 1940 Today's Date: 03/05/2016    History of Present Illness 76 y.o. female admitted to [facility] on 03/04/16 for elective R TKA.  Pt with significant PMHx of L THA, neuromuscular d/o, urinary incontinence, HTN, anemia, multiple back surgeries, carpal tunnel release.    Clinical Impression   Pt currently min to mod assist for LB selfcare secondary to not being able to reach down to the right foot for selfcare tasks at this time.  Had AE at home for short term use and husband available to assist initially until she regains her flexibility.  Have educated pt and spouse on safe completion of shower transfers, toileting and need for walker bag for carrying items as needed around the house safely.  No further OT needs at this time.      Follow Up Recommendations  No OT follow up;Supervision - Intermittent    Equipment Recommendations  None recommended by OT    Recommendations for Other Services       Precautions / Restrictions Precautions Precautions: Knee Precaution Comments: reviewed nothing under knee, use of zero degree foam, and HEP Restrictions Weight Bearing Restrictions: No RLE Weight Bearing: Weight bearing as tolerated      Mobility Bed Mobility Overal bed mobility: Modified Independent Bed Mobility: Supine to Sit;Sit to Supine     Supine to sit: Supervision Sit to supine: Supervision   General bed mobility comments: increased time and effort  Transfers Overall transfer level: Needs assistance Equipment used: Rolling walker (2 wheeled) Transfers: Sit to/from Stand Sit to Stand: Min assist Stand pivot transfers: Min assist       General transfer comment: Pt needed assistance for initial stand from the EOB but could complete with min guard assist from elevated toilet with rail.     Balance Overall balance assessment: Needs assistance Sitting-balance support: Feet  supported;No upper extremity supported Sitting balance-Leahy Scale: Good     Standing balance support: Single extremity supported Standing balance-Leahy Scale: Fair Standing balance comment: Needs use of RW for support with mobility but can stand statically without UE support during completion of grooming activities.                            ADL Overall ADL's : Needs assistance/impaired Eating/Feeding: Independent   Grooming: Wash/dry hands;Wash/dry face;Standing;Supervision/safety   Upper Body Bathing: Set up;Sitting   Lower Body Bathing: Minimal assistance;Sit to/from stand   Upper Body Dressing : Supervision/safety;Sitting   Lower Body Dressing: Moderate assistance;Sit to/from stand   Toilet Transfer: Minimal assistance;Ambulation;RW;Grab bars;Comfort height toilet   Toileting- Clothing Manipulation and Hygiene: Minimal assistance;Sit to/from stand   Tub/ Shower Transfer: Walk-in shower;Min guard;Ambulation;Anterior/posterior   Functional mobility during ADLs: Min guard;Rolling walker General ADL Comments: Pt needs assist with dressing the RLE secondary to pain and decreased flexibility.  She has assist from husband as needed and reports having AE from surgery on the left hip in the past.  No further OT needs at this time.  Pt has all needed DME as well and 24 hour supervision initially.     Vision Vision Assessment?: No apparent visual deficits   Perception     Praxis Praxis Praxis tested?: Within functional limits    Pertinent Vitals/Pain Pain Assessment: 0-10 Pain Score: 6  Faces Pain Scale: Hurts little more Pain Location: right knee  Pain Descriptors / Indicators: Grimacing Pain Intervention(s): Limited activity within patient's tolerance  Hand Dominance Right   Extremity/Trunk Assessment Upper Extremity Assessment Upper Extremity Assessment: RUE deficits/detail RUE Deficits / Details: Pt with recent carpal tunnel surgery.  Strength 4/5  in all areas with numbness reported in the thumb, first and second digits.    Lower Extremity Assessment Lower Extremity Assessment: Defer to PT evaluation   Cervical / Trunk Assessment Cervical / Trunk Assessment: Normal   Communication Communication Communication: No difficulties   Cognition Arousal/Alertness: Awake/alert Behavior During Therapy: WFL for tasks assessed/performed Overall Cognitive Status: Within Functional Limits for tasks assessed                                Home Living     Available Help at Discharge: Family;Available 24 hours/day;Other (Comment) Type of Home: House Home Access: Ramped entrance     Home Layout: One level     Bathroom Shower/Tub: Occupational psychologist: Standard Bathroom Accessibility: Yes   Home Equipment: Environmental consultant - 2 wheels;Cane - single point;Toilet riser;Grab bars - tub/shower;Hand held shower head      Lives With: Spouse    Prior Functioning/Environment Level of Independence: Independent                       OT Goals(Current goals can be found in the care plan section) Acute Rehab OT Goals Patient Stated Goal: go home  OT Frequency:                End of Session Equipment Utilized During Treatment: Rolling walker CPM Right Knee CPM Right Knee: On Right Knee Flexion (Degrees): 90 Nurse Communication: Mobility status  Activity Tolerance: Patient tolerated treatment well Patient left: in bed;in CPM   Time: [id number] OT Time Calculation (min): 32 min Charges:  OT General Charges $OT Visit: 1 Procedure OT Evaluation $OT Eval Moderate Complexity: 1 Procedure OT Treatments $Self Care/Home Management : 8-22 mins  Gean Laursen OTR/L 03/05/2016, 4:23 PM

## 2016-03-05 NOTE — Progress Notes (Signed)
Physical Therapy Treatment Patient Details Name: Shelly Hickman MRN: [id number] DOB: 09/11/1940 Today's Date: 03/05/2016    History of Present Illness 76 y.o. female admitted to [facility] on 03/04/16 for elective R TKA.  Pt with significant PMHx of L THA, neuromuscular d/o, urinary incontinence, HTN, anemia, multiple back surgeries, carpal tunnel release.     PT Comments    Patient is progressing well toward mobility goals and tolerated increased gait distance and therex this session. SpO2 95% or > on RA throughout session. Continue to progress as tolerated with anticipated d/c home with HHPT.   Follow Up Recommendations  Home health PT;Supervision for mobility/OOB     Equipment Recommendations  None recommended by PT    Recommendations for Other Services       Precautions / Restrictions Precautions Precautions: Knee Precaution Comments: reviewed nothing under knee, use of zero degree foam, and HEP Restrictions Weight Bearing Restrictions: Yes RLE Weight Bearing: Weight bearing as tolerated    Mobility  Bed Mobility Overal bed mobility: Needs Assistance Bed Mobility: Sit to Supine       Sit to supine: Supervision   General bed mobility comments: cues for technique and sequencing; HOB flat and no use of rails; pt able to assist R LE elevation with L LE   Transfers Overall transfer level: Needs assistance Equipment used: Rolling walker (2 wheeled) Transfers: Sit to/from Stand Sit to Stand: Min assist         General transfer comment: assist to power up into standing; cues for hand placement  Ambulation/Gait Ambulation/Gait assistance: Min guard Ambulation Distance (Feet): 120 Feet Assistive device: Rolling walker (2 wheeled) Gait Pattern/deviations: Antalgic;Step-through pattern;Decreased stride length;Decreased weight shift to right Gait velocity: decreased   General Gait Details: cues for posture and sequencing; pt with mildly antalgic gait however with good  step length symmetry   Stairs            Wheelchair Mobility    Modified Rankin (Stroke Patients Only)       Balance Overall balance assessment: Needs assistance Sitting-balance support: Feet supported;No upper extremity supported Sitting balance-Leahy Scale: Good     Standing balance support: Bilateral upper extremity supported Standing balance-Leahy Scale: Poor                      Cognition Arousal/Alertness: Awake/alert Behavior During Therapy: WFL for tasks assessed/performed Overall Cognitive Status: Within Functional Limits for tasks assessed                      Exercises Total Joint Exercises Quad Sets: AROM;Right;10 reps Heel Slides: AROM;Right;10 reps Straight Leg Raises: AROM;Right;5 reps Goniometric ROM: 85 degrees in sitting    General Comments        Pertinent Vitals/Pain Pain Assessment: Faces Faces Pain Scale: Hurts even more Pain Location: right knee with flexion Pain Descriptors / Indicators: Grimacing;Guarding;Sore Pain Intervention(s): Limited activity within patient's tolerance;Monitored during session;Premedicated before session;Repositioned    Home Living                      Prior Function            PT Goals (current goals can now be found in the care plan section) Acute Rehab PT Goals Patient Stated Goal: go home Progress towards PT goals: Progressing toward goals    Frequency    7X/week      PT Plan Current plan remains appropriate    Co-evaluation  End of Session Equipment Utilized During Treatment: Gait belt Activity Tolerance: Patient tolerated treatment well Patient left: with call bell/phone within reach;in bed;Other (comment) (R LE in zero degree foam)     Time: [id number] PT Time Calculation (min) (ACUTE ONLY): 35 min  Charges:  $Gait Training: 8-22 mins $Therapeutic Exercise: 8-22 mins                    G Codes:      Salina April,  PTA Pager: [phone]   03/05/2016, 11:13 AM

## 2016-03-05 NOTE — Progress Notes (Signed)
Orthopedic Tech Progress Note Patient Details:  Shelly Hickman 1940-09-22 [id number] Put on cpm at [place] Patient ID: [name], female   DOB: Dec 17, 1940, 76 y.o.   MRN: [id number]   Braulio Bosch 03/05/2016, 6:19 PM

## 2016-03-06 ENCOUNTER — Inpatient Hospital Stay (HOSPITAL_COMMUNITY): Payer: PPO

## 2016-03-06 LAB — GLUCOSE, CAPILLARY
Glucose-Capillary: 144 mg/dL — ABNORMAL HIGH (ref 65–99)
Glucose-Capillary: 149 mg/dL — ABNORMAL HIGH (ref 65–99)
Glucose-Capillary: 153 mg/dL — ABNORMAL HIGH (ref 65–99)
Glucose-Capillary: 182 mg/dL — ABNORMAL HIGH (ref 65–99)
Glucose-Capillary: 185 mg/dL — ABNORMAL HIGH (ref 65–99)

## 2016-03-06 LAB — BASIC METABOLIC PANEL
Anion gap: 11 (ref 5–15)
BUN: 16 mg/dL (ref 6–20)
CO2: 26 mmol/L (ref 22–32)
Calcium: 8.6 mg/dL — ABNORMAL LOW (ref 8.9–10.3)
Chloride: 96 mmol/L — ABNORMAL LOW (ref 101–111)
Creatinine, Ser: 0.74 mg/dL (ref 0.44–1.00)
GFR calc Af Amer: >60 mL/min (ref >60)
GFR calc non Af Amer: >60 mL/min (ref >60)
Glucose, Bld: 192 mg/dL — ABNORMAL HIGH (ref 65–99)
Potassium: 3.7 mmol/L (ref 3.5–5.1)
Sodium: 133 mmol/L — ABNORMAL LOW (ref 135–145)

## 2016-03-06 LAB — CBC
HCT: 30.7 % — ABNORMAL LOW (ref 36.0–46.0)
Hemoglobin: 10.1 g/dL — ABNORMAL LOW (ref 12.0–15.0)
MCH: 29.2 pg (ref 26.0–34.0)
MCHC: 32.9 g/dL (ref 30.0–36.0)
MCV: 88.7 fL (ref 78.0–100.0)
Platelets: 288 10*3/uL (ref 150–400)
RBC: 3.46 MIL/uL — ABNORMAL LOW (ref 3.87–5.11)
RDW: 15.2 % (ref 11.5–15.5)
WBC: 22.9 10*3/uL — ABNORMAL HIGH (ref 4.0–10.5)

## 2016-03-06 LAB — HEMOGLOBIN A1C
Hgb A1c MFr Bld: 5.9 % — ABNORMAL HIGH (ref 4.8–5.6)
Mean Plasma Glucose: 123 mg/dL

## 2016-03-06 MED ORDER — CLINDAMYCIN HCL 300 MG PO CAPS
300.0000 mg | ORAL_CAPSULE | Freq: Four times a day (QID) | ORAL | Status: DC
  Administered 2016-03-06 – 2016-03-07 (×5): 300 mg via ORAL
  Filled 2016-03-06 (×5): qty 1

## 2016-03-06 NOTE — Progress Notes (Signed)
Physical Therapy Treatment Patient Details Name: Shelly Hickman MRN: [id number] DOB: 1940/09/18 Today's Date: 03/06/2016    History of Present Illness 76 y.o. female admitted to [facility] on 03/04/16 for elective R TKA.  Pt with significant PMHx of L THA, neuromuscular d/o, urinary incontinence, HTN, anemia, multiple back surgeries, carpal tunnel release.     PT Comments    Patient continues to do well with mobility and is motivated to participate in therapy. Pt continues to have the most difficulty with sit to stands and requires min A. Continue to progress as tolerated with anticipated d/c home with HHPT.   Follow Up Recommendations  Home health PT;Supervision for mobility/OOB     Equipment Recommendations  None recommended by PT    Recommendations for Other Services       Precautions / Restrictions Precautions Precautions: Knee Precaution Comments: reviewed nothing under knee, use of zero degree foam, and HEP Restrictions Weight Bearing Restrictions: Yes RLE Weight Bearing: Weight bearing as tolerated    Mobility  Bed Mobility Overal bed mobility: Modified Independent Bed Mobility: Supine to Sit           General bed mobility comments: increased time and effort  Transfers Overall transfer level: Needs assistance Equipment used: Rolling walker (2 wheeled) Transfers: Sit to/from Stand Sit to Stand: Min assist         General transfer comment: assist to power up into standing; cues for hand placement  Ambulation/Gait Ambulation/Gait assistance: Min guard Ambulation Distance (Feet): 180 Feet Assistive device: Rolling walker (2 wheeled) Gait Pattern/deviations: Step-through pattern;Decreased stride length;Decreased weight shift to right;Antalgic Gait velocity: decreased   General Gait Details: cues for posture and R heel strike   Stairs            Wheelchair Mobility    Modified Rankin (Stroke Patients Only)       Balance Overall balance  assessment: Needs assistance Sitting-balance support: Feet supported;No upper extremity supported Sitting balance-Leahy Scale: Good     Standing balance support: Single extremity supported Standing balance-Leahy Scale: Poor                      Cognition Arousal/Alertness: Awake/alert Behavior During Therapy: WFL for tasks assessed/performed Overall Cognitive Status: Within Functional Limits for tasks assessed                      Exercises Total Joint Exercises Quad Sets: AROM;Right;10 reps Heel Slides: AROM;Right;10 reps Hip ABduction/ADduction: AROM;Right;10 reps Straight Leg Raises: AROM;Right;10 reps Long Arc Quad: AROM;Right;10 reps Knee Flexion: AROM;Right;5 reps;Seated;Other (comment) (10 sec holds) Goniometric ROM: 3-90    General Comments        Pertinent Vitals/Pain Pain Assessment: 0-10 Pain Score: 8  Pain Location: right knee with flexion Pain Descriptors / Indicators: Grimacing;Sore Pain Intervention(s): Limited activity within patient's tolerance;Monitored during session;Premedicated before session;Repositioned;Ice applied    Home Living                      Prior Function            PT Goals (current goals can now be found in the care plan section) Acute Rehab PT Goals Patient Stated Goal: go home Progress towards PT goals: Progressing toward goals    Frequency    7X/week      PT Plan Current plan remains appropriate    Co-evaluation             End of Session  Equipment Utilized During Treatment: Gait belt Activity Tolerance: Patient tolerated treatment well Patient left: in chair;with call bell/phone within reach;Other (comment) (in zero degree foam)     Time: 1000-1031 PT Time Calculation (min) (ACUTE ONLY): 31 min  Charges:  $Gait Training: 8-22 mins $Therapeutic Exercise: 8-22 mins                    G Codes:      Salina April, PTA Pager: [phone]   03/06/2016, 2:29  PM

## 2016-03-06 NOTE — Care Management Note (Signed)
Case Management Note  Patient Details  Name: Shelly Hickman MRN: [id number] Date of Birth: 04/30/1940  Subjective/Objective:  76 yr old female s/p right total knee arthroplasty.             Action/Plan: Case manager spoke with patient concerning [place] and DME needs. Choice was offered for [place]. Referral was called to Stevie Kern, [place] Liaison. CM will continue to monitor.   Expected Discharge Date:     03/07/16             Expected Discharge Plan:  [place]  In-House Referral:  NA  Discharge planning Services  CM Consult  Post Acute Care Choice:  Home Health Choice offered to:  Patient  DME Arranged:    DME Agency:     HH Arranged:  PT [place]:  [place]  Status of Service:  In process, will continue to follow  If discussed at Long Length of Stay Meetings, dates discussed:    Additional Comments:  Ninfa Meeker, RN 03/06/2016, 2:25 PM

## 2016-03-06 NOTE — Progress Notes (Signed)
Subjective: 2 Days Post-Op Procedure(s) (LRB): TOTAL KNEE ARTHROPLASTY (Right) Patient reports pain as 6 on 0-10 scale.    Objective: Vital signs in last 24 hours: Temp:  [98 F (36.7 C)-98.1 F (36.7 C)] 98.1 F (36.7 C) (01/17 0547) Pulse Rate:  [69-76] 69 (01/17 0547) Resp:  [18] 18 (01/17 0547) BP: (114-158)/(44-60) 158/60 (01/17 0547) SpO2:  [93 %-97 %] 94 % (01/17 0547)  Intake/Output from previous day: 01/16 0701 - 01/17 0700 In: 540 [P.O.:540] Out: -  Intake/Output this shift: No intake/output data recorded.   Recent Labs  03/05/16 0334 03/06/16 0342  HGB 11.1* 10.1*    Recent Labs  03/05/16 0334 03/06/16 0342  WBC 18.8* 22.9*  RBC 3.87 3.46*  HCT 34.3* 30.7*  PLT 262 288    Recent Labs  03/05/16 0334 03/06/16 0342  NA 134* 133*  K 4.3 3.7  CL 102 96*  CO2 25 26  BUN 16 16  CREATININE 0.87 0.74  GLUCOSE 194* 192*  CALCIUM 8.3* 8.6*   No results for input(s): LABPT, INR in the last 72 hours.  ABD soft Neurovascular intact Sensation intact distally Incision: scant drainage Wheezing and low O2 sat  Assessment/Plan: 2 Days Post-Op Procedure(s) (LRB): TOTAL KNEE ARTHROPLASTY (Right)  Principal Problem:   Primary localized osteoarthritis of right knee Active Problems:   Status post left hip replacement   Essential hypertension, benign   Constipation   Anxiety   Insomnia   Clinical depression   Degenerative joint disease involving multiple joints   Osteopenia   Memory impairment   Vitamin D deficiency   Prediabetes   Morbid obesity ([place])   Spinal stenosis in cervical region   Spinal stenosis of lumbar region  Advance diet Up with therapy Plan for discharge tomorrow Discharge home with home health  Will start Clindamycin for respiratory issues in the setting of leukocytosis and history of being hospitalized of pneumonia last year.  Shyheim Tanney J 03/06/2016, 10:54 AM

## 2016-03-06 NOTE — Progress Notes (Signed)
Orthopedic Tech Progress Note Patient Details:  Shelly Hickman 1940-09-15 [id number]  Patient ID: [name], female   DOB: 1940-08-10, 76 y.o.   MRN: [id number]   Hildred Priest 03/06/2016, 1:40 PM Placed pt's rle on cpm@1340  @0 -60 degrees; RN notified

## 2016-03-06 NOTE — Progress Notes (Signed)
Physical Therapy Treatment Patient Details Name: Shelly Hickman MRN: [id number] DOB: 14-Sep-1940 Today's Date: 03/06/2016    History of Present Illness 76 y.o. female admitted to [facility] on 03/04/16 for elective R TKA.  Pt with significant PMHx of L THA, neuromuscular d/o, urinary incontinence, HTN, anemia, multiple back surgeries, carpal tunnel release.     PT Comments    Patient tolerated stair training this session. Current plan remains appropriate.   Follow Up Recommendations  Home health PT;Supervision for mobility/OOB     Equipment Recommendations  None recommended by PT    Recommendations for Other Services       Precautions / Restrictions Precautions Precautions: Knee Precaution Comments: reviewed nothing under knee, use of zero degree foam, and HEP Restrictions Weight Bearing Restrictions: Yes RLE Weight Bearing: Weight bearing as tolerated    Mobility  Bed Mobility Overal bed mobility: Modified Independent Bed Mobility: Supine to Sit           General bed mobility comments: increased time and effort  Transfers Overall transfer level: Needs assistance Equipment used: Rolling walker (2 wheeled) Transfers: Sit to/from Stand Sit to Stand: Min assist         General transfer comment: X 3; from EOB and BSC; assist to power up into standing; cues for hand placement and technique; pt with LOB when attempting to pull up pants after using bathroom and required assistance to recover  Ambulation/Gait Ambulation/Gait assistance: Min guard Ambulation Distance (Feet): 150 Feet Assistive device: Rolling walker (2 wheeled) Gait Pattern/deviations: Step-through pattern;Decreased stride length;Decreased weight shift to right;Antalgic Gait velocity: decreased   General Gait Details: cues for posture and R heel strike   Stairs Stairs: Yes   Stair Management: One rail Right;Sideways Number of Stairs: 3 General stair comments: cues for sequencing and  technique  Wheelchair Mobility    Modified Rankin (Stroke Patients Only)       Balance Overall balance assessment: Needs assistance Sitting-balance support: Feet supported;No upper extremity supported Sitting balance-Leahy Scale: Good     Standing balance support: Single extremity supported Standing balance-Leahy Scale: Poor                      Cognition Arousal/Alertness: Awake/alert Behavior During Therapy: WFL for tasks assessed/performed Overall Cognitive Status: Within Functional Limits for tasks assessed                      Exercises Total Joint Exercises Quad Sets: AROM;Right;10 reps Heel Slides: AROM;Right;10 reps Hip ABduction/ADduction: AROM;Right;10 reps Straight Leg Raises: AROM;Right;10 reps Long Arc Quad: AROM;Right;10 reps Knee Flexion: AROM;Right;5 reps;Seated;Other (comment) (10 sec holds) Goniometric ROM: 3-90    General Comments        Pertinent Vitals/Pain Pain Assessment: Faces Pain Score: 8  Faces Pain Scale: Hurts little more Pain Location: right knee with flexion Pain Descriptors / Indicators: Grimacing;Sore Pain Intervention(s): Limited activity within patient's tolerance;Monitored during session;Premedicated before session;Repositioned    Home Living                      Prior Function            PT Goals (current goals can now be found in the care plan section) Acute Rehab PT Goals Patient Stated Goal: go home Progress towards PT goals: Progressing toward goals    Frequency    7X/week      PT Plan Current plan remains appropriate    Co-evaluation  End of Session Equipment Utilized During Treatment: Gait belt Activity Tolerance: Patient tolerated treatment well Patient left: in chair;with call bell/phone within reach     Time: [id number] PT Time Calculation (min) (ACUTE ONLY): 31 min  Charges:  $Gait Training: 23-37 mins                     G Codes:      Salina April, PTA Pager: [phone]   03/06/2016, 4:12 PM

## 2016-03-07 LAB — BASIC METABOLIC PANEL
Anion gap: 7 (ref 5–15)
BUN: 20 mg/dL (ref 6–20)
CO2: 30 mmol/L (ref 22–32)
Calcium: 8.3 mg/dL — ABNORMAL LOW (ref 8.9–10.3)
Chloride: 95 mmol/L — ABNORMAL LOW (ref 101–111)
Creatinine, Ser: 0.79 mg/dL (ref 0.44–1.00)
GFR calc Af Amer: >60 mL/min (ref >60)
GFR calc non Af Amer: >60 mL/min (ref >60)
Glucose, Bld: 101 mg/dL — ABNORMAL HIGH (ref 65–99)
Potassium: 4 mmol/L (ref 3.5–5.1)
Sodium: 132 mmol/L — ABNORMAL LOW (ref 135–145)

## 2016-03-07 LAB — GLUCOSE, CAPILLARY
Glucose-Capillary: 100 mg/dL — ABNORMAL HIGH (ref 65–99)
Glucose-Capillary: 109 mg/dL — ABNORMAL HIGH (ref 65–99)

## 2016-03-07 LAB — CBC
HCT: 31.8 % — ABNORMAL LOW (ref 36.0–46.0)
Hemoglobin: 10.2 g/dL — ABNORMAL LOW (ref 12.0–15.0)
MCH: 28.8 pg (ref 26.0–34.0)
MCHC: 32.1 g/dL (ref 30.0–36.0)
MCV: 89.8 fL (ref 78.0–100.0)
Platelets: 262 10*3/uL (ref 150–400)
RBC: 3.54 MIL/uL — ABNORMAL LOW (ref 3.87–5.11)
RDW: 15.2 % (ref 11.5–15.5)
WBC: 17.8 10*3/uL — ABNORMAL HIGH (ref 4.0–10.5)

## 2016-03-07 MED ORDER — OXYCODONE HCL 5 MG PO TABS: ORAL_TABLET | ORAL | 0 refills | Status: DC

## 2016-03-07 MED ORDER — ACETAMINOPHEN 325 MG PO TABS: 650.0000 mg | ORAL_TABLET | Freq: Four times a day (QID) | ORAL | Status: AC | PRN

## 2016-03-07 MED ORDER — ASPIRIN 325 MG PO TBEC: DELAYED_RELEASE_TABLET | ORAL | 0 refills | Status: DC

## 2016-03-07 MED ORDER — POLYETHYLENE GLYCOL 3350 17 G PO PACK: PACK | ORAL | 0 refills | Status: DC

## 2016-03-07 MED ORDER — DOCUSATE SODIUM 100 MG PO CAPS: ORAL_CAPSULE | ORAL | 0 refills | Status: DC

## 2016-03-07 MED ORDER — CLINDAMYCIN HCL 300 MG PO CAPS: 300.0000 mg | ORAL_CAPSULE | Freq: Four times a day (QID) | ORAL | 0 refills | Status: DC

## 2016-03-07 NOTE — Discharge Summary (Signed)
Patient ID: Shelly Hickman MRN: [id number] DOB/AGE: 25-Aug-1940 76 y.o.  Admit date: 03/04/2016 Discharge date: 03/07/2016  Admission Diagnoses:  Principal Problem:   Primary localized osteoarthritis of right knee Active Problems:   Status post left hip replacement   Essential hypertension, benign   Constipation   Anxiety   Insomnia   Clinical depression   Degenerative joint disease involving multiple joints   Osteopenia   Memory impairment   Vitamin D deficiency   Prediabetes   Morbid obesity ([place])   Spinal stenosis in cervical region   Spinal stenosis of lumbar region   Discharge Diagnoses:  Same  Past Medical History:  Diagnosis Date  . Anemia   . Anxiety   . Arthritis   . Depression   . H/O hiatal hernia   . Hypercholesterolemia   . Hypertension   . Incontinence of urine   . Neuromuscular disorder ([place])   . Osteoarthritis     Surgeries: Procedure(s): TOTAL KNEE ARTHROPLASTY on 03/04/2016   Consultants:   Discharged Condition: Improved  Hospital Course: Shelly Hickman is an 76 y.o. female who was admitted 03/04/2016 for operative treatment ofPrimary localized osteoarthritis of right knee. Patient has severe unremitting pain that affects sleep, daily activities, and work/hobbies. After pre-op clearance the patient was taken to the operating room on 03/04/2016 and underwent  Procedure(s): TOTAL KNEE ARTHROPLASTY.    Patient was given perioperative antibiotics: Anti-infectives    Start     Dose/Rate Route Frequency Ordered Stop   03/07/16 0000  clindamycin (CLEOCIN) 300 MG capsule     300 mg Oral Every 6 hours 03/07/16 1048     03/06/16 1200  clindamycin (CLEOCIN) capsule 300 mg     300 mg Oral Every 6 hours 03/06/16 1015     03/04/16 2000  vancomycin (VANCOCIN) IVPB 1000 mg/200 mL premix     1,000 mg 200 mL/hr over 60 Minutes Intravenous Every 12 hours 03/04/16 1321 03/04/16 2112   03/04/16 0845  vancomycin (VANCOCIN) 1,500 mg in sodium chloride 0.9 %  500 mL IVPB     1,500 mg 250 mL/hr over 120 Minutes Intravenous On call to O.R. 03/03/16 1428 03/04/16 1011       Patient was given sequential compression devices, early ambulation, and chemoprophylaxis to prevent DVT.  Patient benefited maximally from hospital stay and there were no complications.    Recent vital signs: Patient Vitals for the past 24 hrs:  BP Temp Temp src Pulse Resp SpO2  03/07/16 0459 (!) 104/48 98 F (36.7 C) Oral 70 18 94 %  03/06/16 2130 (!) 169/69 98.1 F (36.7 C) Oral 73 18 98 %     Recent laboratory studies:  Recent Labs  03/06/16 0342 03/07/16 0437  WBC 22.9* 17.8*  HGB 10.1* 10.2*  HCT 30.7* 31.8*  PLT 288 262  NA 133* 132*  K 3.7 4.0  CL 96* 95*  CO2 26 30  BUN 16 20  CREATININE 0.74 0.79  GLUCOSE 192* 101*  CALCIUM 8.6* 8.3*     Discharge Medications:   Allergies as of 03/07/2016      Reactions   Avelox [moxifloxacin Hcl In Nacl] Other (See Comments)   CHEST PAIN OF UNSPECIFIED ORIGIN   Cephalosporins Hives, Itching   Other Itching, Swelling   UNSPECIFIED INSECT STINGS      Medication List    STOP taking these medications   aspirin 81 MG chewable tablet Replaced by:  aspirin 325 MG EC tablet     TAKE  these medications   acetaminophen 325 MG tablet Commonly known as:  TYLENOL Take 2 tablets (650 mg total) by mouth every 6 (six) hours as needed for mild pain (or Fever >/= 101).   amLODipine 5 MG tablet Commonly known as:  NORVASC Take 5 mg by mouth daily before breakfast.   aspirin 325 MG EC tablet 1 tab a day for the next 30 days to prevent blood clots Replaces:  aspirin 81 MG chewable tablet   CALCIUM 600-D PO Take 1 tablet by mouth daily.   calcium carbonate 750 MG chewable tablet Commonly known as:  TUMS EX Chew 2 tablets by mouth daily as needed for heartburn.   clindamycin 300 MG capsule Commonly known as:  CLEOCIN Take 1 capsule (300 mg total) by mouth every 6 (six) hours.   docusate sodium 100 MG  capsule Commonly known as:  COLACE 1 tab 2 times a day while on narcotics.  STOOL SOFTENER   DULoxetine 60 MG capsule Commonly known as:  CYMBALTA Take 2 capsules (120 mg total) by mouth daily before breakfast.   fluticasone 0.05 % cream Commonly known as:  CUTIVATE Apply 1 application topically 2 (two) times daily as needed (irritation).   fluticasone 50 MCG/ACT nasal spray Commonly known as:  FLONASE Place 2 sprays into the nose daily.   gabapentin 300 MG capsule Commonly known as:  NEURONTIN Take 3 capsules (900 mg total) by mouth 3 (three) times daily.   ketoconazole 2 % cream Commonly known as:  NIZORAL Apply 1 application topically 2 (two) times daily as needed for irritation.   losartan-hydrochlorothiazide 100-25 MG tablet Commonly known as:  HYZAAR Take 1 tablet by mouth daily before breakfast.   mineral oil-hydrophilic petrolatum ointment Apply 1 application topically as needed for dry skin.   montelukast 10 MG tablet Commonly known as:  SINGULAIR Take 10 mg by mouth daily before breakfast.   niacin 500 MG CR tablet Commonly known as:  NIASPAN Take 1 tablet by mouth at bedtime.   omega-3 acid ethyl esters 1 g capsule Commonly known as:  LOVAZA Take 2 capsules nightly before bed   oxybutynin 10 MG 24 hr tablet Commonly known as:  DITROPAN-XL Take 1 tablet (10 mg total) by mouth daily.   oxyCODONE 5 MG immediate release tablet Commonly known as:  Oxy IR/ROXICODONE 1-2 tablets every 4-6 hrs as needed for pain   polyethylene glycol packet Commonly known as:  MIRALAX / GLYCOLAX 17grams in 6 oz of water twice a day until bowel movement.  LAXITIVE.  Restart if two days since last bowel movement   simvastatin 40 MG tablet Commonly known as:  ZOCOR Take 1 tablet (40 mg total) by mouth at bedtime.   traZODone 100 MG tablet Commonly known as:  DESYREL Take 1 tablet (100 mg total) by mouth at bedtime.   Vitamin D-3 5000 units Tabs Take 5,000 Units by mouth  daily.       Diagnostic Studies: Dg Chest 2 View  Result Date: 03/06/2016 CLINICAL DATA:  Pt c/o wheezing x 1 day. Hx of HTN AND PNA. Pt is a former smoker. EXAM: CHEST  2 VIEW COMPARISON:  07/01/2013 FINDINGS: Lateral view degraded by patient arm position. Midline trachea. Moderate cardiomegaly. Atherosclerosis in the transverse aorta. No pleural effusion or pneumothorax. No congestive failure. Mild scarring and volume loss involves the superior aspect of the right middle lobe. IMPRESSION: No acute cardiopulmonary disease. Cardiomegaly without congestive failure. Aortic atherosclerosis. Electronically Signed   By: Marylyn Ishihara  Jobe Igo M.D.   On: 03/06/2016 13:18    Disposition: 01-Home or Self Care  Discharge Instructions    CPM    Complete by:  As directed    Continuous passive motion machine (CPM):      Use the CPM from 0 to 90 for 6 hours per day.       You may break it up into 2 or 3 sessions per day.      Use CPM for 2 weeks or until you are told to stop.   Call MD / Call 911    Complete by:  As directed    If you experience chest pain or shortness of breath, CALL 911 and be transported to the hospital emergency room.  If you develope a fever above 101 F, pus (white drainage) or increased drainage or redness at the wound, or calf pain, call your surgeon's office.   Change dressing    Complete by:  As directed    DO NOT REMOVE BANDAGE OVER SURGICAL INCISION.  [place] WHOLE LEG INCLUDING OVER THE WATERPROOF BANDAGE WITH SOAP AND WATER EVERY DAY.   Constipation Prevention    Complete by:  As directed    Drink plenty of fluids.  Prune juice may be helpful.  You may use a stool softener, such as Colace (over the counter) 100 mg twice a day.  Use MiraLax (over the counter) for constipation as needed.   Diet - low sodium heart healthy    Complete by:  As directed    Discharge instructions    Complete by:  As directed    INSTRUCTIONS AFTER JOINT REPLACEMENT   Remove items at home which could  result in a fall. This includes throw rugs or furniture in walking pathways ICE to the affected joint every three hours while awake for 30 minutes at a time, for at least the first 3-5 days, and then as needed for pain and swelling.  Continue to use ice for pain and swelling. You may notice swelling that will progress down to the foot and ankle.  This is normal after surgery.  Elevate your leg when you are not up walking on it.   Continue to use the breathing machine you got in the hospital (incentive spirometer) which will help keep your temperature down.  It is common for your temperature to cycle up and down following surgery, especially at night when you are not up moving around and exerting yourself.  The breathing machine keeps your lungs expanded and your temperature down.   DIET:  As you were doing prior to hospitalization, we recommend a well-balanced diet.  DRESSING / WOUND CARE / SHOWERING  Keep the surgical dressing until follow up.  The dressing is water proof, so you can shower without any extra covering.  IF THE DRESSING FALLS OFF or the wound gets wet inside, change the dressing with sterile gauze.  Please use good hand washing techniques before changing the dressing.  Do not use any lotions or creams on the incision until instructed by your surgeon.    ACTIVITY  Increase activity slowly as tolerated, but follow the weight bearing instructions below.   No driving for 6 weeks or until further direction given by your physician.  You cannot drive while taking narcotics.  No lifting or carrying greater than 10 lbs. until further directed by your surgeon. Avoid periods of inactivity such as sitting longer than an hour when not asleep. This helps prevent blood clots.  You may  return to work once you are authorized by your doctor.     WEIGHT BEARING   Weight bearing as tolerated with assist device (walker, cane, etc) as directed, use it as long as suggested by your surgeon or  therapist, typically at least 2-3 weeks.   EXERCISES  Results after joint replacement surgery are often greatly improved when you follow the exercise, range of motion and muscle strengthening exercises prescribed by your doctor. Safety measures are also important to protect the joint from further injury. Any time any of these exercises cause you to have increased pain or swelling, decrease what you are doing until you are comfortable again and then slowly increase them. If you have problems or questions, call your caregiver or physical therapist for advice.   Rehabilitation is important following a joint replacement. After just a few days of immobilization, the muscles of the leg can become weakened and shrink (atrophy).  These exercises are designed to build up the tone and strength of the thigh and leg muscles and to improve motion. Often times heat used for twenty to thirty minutes before working out will loosen up your tissues and help with improving the range of motion but do not use heat for the first two weeks following surgery (sometimes heat can increase post-operative swelling).   These exercises can be done on a training (exercise) mat, on the floor, on a table or on a bed. Use whatever works the best and is most comfortable for you.    Use music or television while you are exercising so that the exercises are a pleasant break in your day. This will make your life better with the exercises acting as a break in your routine that you can look forward to.   Perform all exercises about fifteen times, three times per day or as directed.  You should exercise both the operative leg and the other leg as well.   Exercises include:  Quad Sets - Tighten up the muscle on the front of the thigh (Quad) and hold for 5-10 seconds.   Straight Leg Raises - With your knee straight (if you were given a brace, keep it on), lift the leg to 60 degrees, hold for 3 seconds, and slowly lower the leg.  Perform this  exercise against resistance later as your leg gets stronger.  Leg Slides: Lying on your back, slowly slide your foot toward your buttocks, bending your knee up off the floor (only go as far as is comfortable). Then slowly slide your foot back down until your leg is flat on the floor again.  Angel Wings: Lying on your back spread your legs to the side as far apart as you can without causing discomfort.  Hamstring Strength:  Lying on your back, push your heel against the floor with your leg straight by tightening up the muscles of your buttocks.  Repeat, but this time bend your knee to a comfortable angle, and push your heel against the floor.  You may put a pillow under the heel to make it more comfortable if necessary.   A rehabilitation program following joint replacement surgery can speed recovery and prevent re-injury in the future due to weakened muscles. Contact your doctor or a physical therapist for more information on knee rehabilitation.    CONSTIPATION  Constipation is defined medically as fewer than three stools per week and severe constipation as less than one stool per week.  Even if you have a regular bowel pattern at home, your  normal regimen is likely to be disrupted due to multiple reasons following surgery.  Combination of anesthesia, postoperative narcotics, change in appetite and fluid intake all can affect your bowels.   YOU MUST use at least one of the following options; they are listed in order of increasing strength to get the job done.  They are all available over the counter, and you may need to use some, POSSIBLY even all of these options:    Drink plenty of fluids (prune juice may be helpful) and high fiber foods Colace 100 mg by mouth twice a day  Senokot for constipation as directed and as needed Dulcolax (bisacodyl), take with full glass of water  Miralax (polyethylene glycol) once or twice a day as needed.  If you have tried all these things and are unable to have a  bowel movement in the first 3-4 days after surgery call either your surgeon or your primary doctor.    If you experience loose stools or diarrhea, hold the medications until you stool forms back up.  If your symptoms do not get better within 1 week or if they get worse, check with your doctor.  If you experience "the worst abdominal pain ever" or develop nausea or vomiting, please contact the office immediately for further recommendations for treatment.   ITCHING:  If you experience itching with your medications, try taking only a single pain pill, or even half a pain pill at a time.  You can also use Benadryl over the counter for itching or also to help with sleep.   TED HOSE STOCKINGS:  Use stockings on both legs until for at least 2 weeks or as directed by physician office. They may be removed at night for sleeping.  MEDICATIONS:  See your medication summary on the "After Visit Summary" that nursing will review with you.  You may have some home medications which will be placed on hold until you complete the course of blood thinner medication.  It is important for you to complete the blood thinner medication as prescribed.  PRECAUTIONS:  If you experience chest pain or shortness of breath - call 911 immediately for transfer to the hospital emergency department.   If you develop a fever greater that 101 F, purulent drainage from wound, increased redness or drainage from wound, foul odor from the wound/dressing, or calf pain - CONTACT YOUR SURGEON.                                                   FOLLOW-UP APPOINTMENTS:  If you do not already have a post-op appointment, please call the office for an appointment to be seen by your surgeon.  Guidelines for how soon to be seen are listed in your "After Visit Summary", but are typically between 1-4 weeks after surgery.  OTHER INSTRUCTIONS:   Knee Replacement:  Do not place pillow under knee, focus on keeping the knee straight while resting. CPM  instructions: 0-90 degrees, 2 hours in the morning, 2 hours in the afternoon, and 2 hours in the evening. Place foam block, curve side up under heel at all times except when in CPM or when walking.  DO NOT modify, tear, cut, or change the foam block in any way.  MAKE SURE YOU:  Understand these instructions.  Get help right away if you are not doing well  or get worse.    Thank you for letting us be a part of your medical care team.  It is a privilege we respect greatly.  We hope these instructions will help you stay on track for a fast and full recovery!   Do not put a pillow under the knee. Place it under the heel.    Complete by:  As directed    Place gray foam block, curve side up under heel at all times except when in CPM or when walking.  DO NOT modify, tear, cut, or change in any way the gray foam block.   Increase activity slowly as tolerated    Complete by:  As directed    Patient may shower    Complete by:  As directed    Aquacel dressing is water proof    Wash over it and the whole leg with soap and water at the end of your shower   TED hose    Complete by:  As directed    Use stockings (TED hose) for 2 weeks on both leg(s).  You may remove them at night for sleeping.      Follow-up Information    Lorn Junes, MD Follow up on 03/18/2016.   Specialty:  Orthopedic Surgery Why:  appt time 3 pm Contact information: [place] [address] [phone]        [facility] Follow up.   Why:  Someone from [facility] will contact you to arrange start date and time for therapy. Contact information: [address] [place] [address] [phone]            Signed: Linda Hedges 03/07/2016, 10:48 AM

## 2016-03-07 NOTE — Progress Notes (Signed)
Physical Therapy Treatment Patient Details Name: Shelly Hickman MRN: [id number] DOB: 04/19/1940 Today's Date: 03/07/2016    History of Present Illness 76 y.o. female admitted to [facility] on 03/04/16 for elective R TKA.  Pt with significant PMHx of L THA, neuromuscular d/o, urinary incontinence, HTN, anemia, multiple back surgeries, carpal tunnel release.     PT Comments    Patient is making good progress with PT.  From a mobility standpoint anticipate patient will be ready for DC home when medically ready.     Follow Up Recommendations  Home health PT;Supervision for mobility/OOB     Equipment Recommendations  None recommended by PT    Recommendations for Other Services       Precautions / Restrictions Precautions Precautions: Knee Precaution Comments: reviewed nothing under knee, use of zero degree foam, and HEP Restrictions Weight Bearing Restrictions: Yes RLE Weight Bearing: Weight bearing as tolerated    Mobility  Bed Mobility Overal bed mobility: Modified Independent Bed Mobility: Supine to Sit           General bed mobility comments: increased time and effort  Transfers Overall transfer level: Needs assistance Equipment used: Rolling walker (2 wheeled) Transfers: Sit to/from Stand Sit to Stand: Min guard         General transfer comment: cues for safe hand placement; increased time and effort this pm due to increased pain  Ambulation/Gait Ambulation/Gait assistance: Supervision Ambulation Distance (Feet): 175 Feet Assistive device: Rolling walker (2 wheeled) Gait Pattern/deviations: Step-through pattern;Decreased stride length;Decreased weight shift to right;Antalgic;Decreased stance time - right;Decreased step length - left Gait velocity: decreased   General Gait Details: cues for proximity of RW, R knee extension during stance phase, increased R knee flexion during swing phase, and posture   Stairs            Wheelchair Mobility    Modified  [place] (Stroke Patients Only)       Balance Overall balance assessment: Needs assistance Sitting-balance support: Feet supported;No upper extremity supported Sitting balance-Leahy Scale: Good     Standing balance support: Single extremity supported Standing balance-Leahy Scale: Fair                      Cognition Arousal/Alertness: Awake/alert Behavior During Therapy: WFL for tasks assessed/performed Overall Cognitive Status: Within Functional Limits for tasks assessed                      Exercises Total Joint Exercises Long Arc Quad: AROM;Right;15 reps Knee Flexion: AROM;Right;15 reps    General Comments        Pertinent Vitals/Pain Pain Assessment: Faces Faces Pain Scale: Hurts even more Pain Location: right knee with flexion Pain Descriptors / Indicators: Grimacing;Sore;Guarding Pain Intervention(s): Limited activity within patient's tolerance;Monitored during session;Premedicated before session;Repositioned;Ice applied    Home Living                      Prior Function            PT Goals (current goals can now be found in the care plan section) Acute Rehab PT Goals Patient Stated Goal: go home Progress towards PT goals: Progressing toward goals    Frequency    7X/week      PT Plan Current plan remains appropriate    Co-evaluation             End of Session Equipment Utilized During Treatment: Gait belt Activity Tolerance: Patient tolerated treatment well  Patient left: in chair;with call bell/phone within reach     Time: 1450-1509 PT Time Calculation (min) (ACUTE ONLY): 19 min  Charges:  $Gait Training: 8-22 mins                    G Codes:      Salina April, PTA Pager: [phone]   03/07/2016, 3:17 PM

## 2016-03-07 NOTE — Progress Notes (Signed)
Orthopedic Tech Progress Note Patient Details:  Shelly Hickman 01-26-1941 [id number]  Patient ID: Shelly Hickman, female   DOB: 26-Sep-1940, 76 y.o.   MRN: [id number] Applied cpm 0-60  Karolee Stamps 03/07/2016, 6:22 AM

## 2016-03-07 NOTE — Progress Notes (Signed)
Physical Therapy Treatment Patient Details Name: Shelly Hickman MRN: [id number] DOB: 01-14-1941 Today's Date: 03/07/2016    History of Present Illness 76 y.o. female admitted to [facility] on 03/04/16 for elective R TKA.  Pt with significant PMHx of L THA, neuromuscular d/o, urinary incontinence, HTN, anemia, multiple back surgeries, carpal tunnel release.     PT Comments    Patient continues to do well with mobility and tolerated increased therex repetitions this session. Less assist required for sit to stand transfer as well. Current plan remains appropriate.   Follow Up Recommendations  Home health PT;Supervision for mobility/OOB     Equipment Recommendations  None recommended by PT    Recommendations for Other Services       Precautions / Restrictions Precautions Precautions: Knee Precaution Comments: reviewed nothing under knee, use of zero degree foam, and HEP Restrictions Weight Bearing Restrictions: Yes RLE Weight Bearing: Weight bearing as tolerated    Mobility  Bed Mobility Overal bed mobility: Modified Independent Bed Mobility: Supine to Sit           General bed mobility comments: increased time and effort  Transfers Overall transfer level: Needs assistance Equipment used: Rolling walker (2 wheeled) Transfers: Sit to/from Stand Sit to Stand: Min guard         General transfer comment: from EOB; cues for hand placement; less assist required to stand this morning  Ambulation/Gait Ambulation/Gait assistance: Supervision Ambulation Distance (Feet): 200 Feet Assistive device: Rolling walker (2 wheeled) Gait Pattern/deviations: Step-through pattern;Decreased stride length;Decreased weight shift to right;Antalgic;Decreased stance time - right;Decreased step length - left Gait velocity: decreased   General Gait Details: cues for posture, increased R knee extension during stance phase, and R heel strike   Stairs            Wheelchair Mobility     Modified Rankin (Stroke Patients Only)       Balance Overall balance assessment: Needs assistance Sitting-balance support: Feet supported;No upper extremity supported Sitting balance-Leahy Scale: Good     Standing balance support: Single extremity supported Standing balance-Leahy Scale: Fair                      Cognition Arousal/Alertness: Awake/alert Behavior During Therapy: WFL for tasks assessed/performed Overall Cognitive Status: Within Functional Limits for tasks assessed                      Exercises Total Joint Exercises Quad Sets: AROM;Right;15 reps Heel Slides: AROM;Right;15 reps Hip ABduction/ADduction: AROM;Right;15 reps Straight Leg Raises: AROM;Right;15 reps Long Arc Quad: AROM;Right;15 reps Goniometric ROM: 90 degrees flexion    General Comments        Pertinent Vitals/Pain Pain Assessment: Faces Faces Pain Scale: Hurts little more Pain Location: right knee with flexion Pain Descriptors / Indicators: Grimacing;Sore Pain Intervention(s): Limited activity within patient's tolerance;Monitored during session;Premedicated before session;Repositioned    Home Living                      Prior Function            PT Goals (current goals can now be found in the care plan section) Acute Rehab PT Goals Patient Stated Goal: go home Progress towards PT goals: Progressing toward goals    Frequency    7X/week      PT Plan Current plan remains appropriate    Co-evaluation             End of Session  Equipment Utilized During Treatment: Gait belt Activity Tolerance: Patient tolerated treatment well Patient left: in chair;with call bell/phone within reach     Time: 0835-0906 PT Time Calculation (min) (ACUTE ONLY): 31 min  Charges:  $Gait Training: 8-22 mins $Therapeutic Exercise: 8-22 mins                    G Codes:      Salina April, PTA Pager: [phone]   03/07/2016, 10:07  AM

## 2016-03-07 NOTE — Progress Notes (Signed)
Discharge instructions, follow up appts, and RX's explained and provided to patient verbalized understanding. Left floor via wheelchair accompanied by staff no c/o pain at d/c.  Lakara Weiland, Tivis Ringer, RN

## 2016-03-08 DIAGNOSIS — Z471 Aftercare following joint replacement surgery: Secondary | ICD-10-CM | POA: Diagnosis not present

## 2016-03-08 DIAGNOSIS — E559 Vitamin D deficiency, unspecified: Secondary | ICD-10-CM | POA: Diagnosis not present

## 2016-03-08 DIAGNOSIS — R7303 Prediabetes: Secondary | ICD-10-CM | POA: Diagnosis not present

## 2016-03-08 DIAGNOSIS — F329 Major depressive disorder, single episode, unspecified: Secondary | ICD-10-CM | POA: Diagnosis not present

## 2016-03-08 DIAGNOSIS — I10 Essential (primary) hypertension: Secondary | ICD-10-CM | POA: Diagnosis not present

## 2016-03-08 DIAGNOSIS — Z96651 Presence of right artificial knee joint: Secondary | ICD-10-CM | POA: Diagnosis not present

## 2016-03-08 DIAGNOSIS — Z7982 Long term (current) use of aspirin: Secondary | ICD-10-CM | POA: Diagnosis not present

## 2016-03-13 DIAGNOSIS — M1711 Unilateral primary osteoarthritis, right knee: Secondary | ICD-10-CM | POA: Diagnosis not present

## 2016-03-14 ENCOUNTER — Other Ambulatory Visit: Payer: Self-pay

## 2016-03-14 ENCOUNTER — Inpatient Hospital Stay: Payer: PPO | Admitting: Family Medicine

## 2016-03-14 MED ORDER — TRAZODONE HCL 100 MG PO TABS: 100.0000 mg | ORAL_TABLET | Freq: Every day | ORAL | 0 refills | Status: DC

## 2016-03-15 ENCOUNTER — Encounter: Payer: Self-pay | Admitting: Family Medicine

## 2016-03-18 DIAGNOSIS — M1711 Unilateral primary osteoarthritis, right knee: Secondary | ICD-10-CM | POA: Diagnosis not present

## 2016-03-19 DIAGNOSIS — R262 Difficulty in walking, not elsewhere classified: Secondary | ICD-10-CM | POA: Diagnosis not present

## 2016-03-19 DIAGNOSIS — M25561 Pain in right knee: Secondary | ICD-10-CM | POA: Diagnosis not present

## 2016-03-19 DIAGNOSIS — M25661 Stiffness of right knee, not elsewhere classified: Secondary | ICD-10-CM | POA: Diagnosis not present

## 2016-03-19 DIAGNOSIS — M6281 Muscle weakness (generalized): Secondary | ICD-10-CM | POA: Diagnosis not present

## 2016-03-21 DIAGNOSIS — M25561 Pain in right knee: Secondary | ICD-10-CM | POA: Diagnosis not present

## 2016-03-21 DIAGNOSIS — M545 Low back pain: Secondary | ICD-10-CM | POA: Diagnosis not present

## 2016-03-21 DIAGNOSIS — M25661 Stiffness of right knee, not elsewhere classified: Secondary | ICD-10-CM | POA: Diagnosis not present

## 2016-03-21 DIAGNOSIS — M1711 Unilateral primary osteoarthritis, right knee: Secondary | ICD-10-CM | POA: Diagnosis not present

## 2016-03-25 DIAGNOSIS — M25661 Stiffness of right knee, not elsewhere classified: Secondary | ICD-10-CM | POA: Diagnosis not present

## 2016-03-25 DIAGNOSIS — M25561 Pain in right knee: Secondary | ICD-10-CM | POA: Diagnosis not present

## 2016-03-25 DIAGNOSIS — R262 Difficulty in walking, not elsewhere classified: Secondary | ICD-10-CM | POA: Diagnosis not present

## 2016-03-25 DIAGNOSIS — M6281 Muscle weakness (generalized): Secondary | ICD-10-CM | POA: Diagnosis not present

## 2016-03-27 DIAGNOSIS — M25661 Stiffness of right knee, not elsewhere classified: Secondary | ICD-10-CM | POA: Diagnosis not present

## 2016-03-27 DIAGNOSIS — M6281 Muscle weakness (generalized): Secondary | ICD-10-CM | POA: Diagnosis not present

## 2016-03-27 DIAGNOSIS — M545 Low back pain: Secondary | ICD-10-CM | POA: Diagnosis not present

## 2016-03-27 DIAGNOSIS — M25561 Pain in right knee: Secondary | ICD-10-CM | POA: Diagnosis not present

## 2016-03-29 DIAGNOSIS — M25661 Stiffness of right knee, not elsewhere classified: Secondary | ICD-10-CM | POA: Diagnosis not present

## 2016-03-29 DIAGNOSIS — M6281 Muscle weakness (generalized): Secondary | ICD-10-CM | POA: Diagnosis not present

## 2016-03-29 DIAGNOSIS — M25561 Pain in right knee: Secondary | ICD-10-CM | POA: Diagnosis not present

## 2016-03-29 DIAGNOSIS — R262 Difficulty in walking, not elsewhere classified: Secondary | ICD-10-CM | POA: Diagnosis not present

## 2016-04-01 DIAGNOSIS — M25661 Stiffness of right knee, not elsewhere classified: Secondary | ICD-10-CM | POA: Diagnosis not present

## 2016-04-01 DIAGNOSIS — M25561 Pain in right knee: Secondary | ICD-10-CM | POA: Diagnosis not present

## 2016-04-01 DIAGNOSIS — R262 Difficulty in walking, not elsewhere classified: Secondary | ICD-10-CM | POA: Diagnosis not present

## 2016-04-01 DIAGNOSIS — M6281 Muscle weakness (generalized): Secondary | ICD-10-CM | POA: Diagnosis not present

## 2016-04-03 DIAGNOSIS — M6281 Muscle weakness (generalized): Secondary | ICD-10-CM | POA: Diagnosis not present

## 2016-04-03 DIAGNOSIS — M25561 Pain in right knee: Secondary | ICD-10-CM | POA: Diagnosis not present

## 2016-04-03 DIAGNOSIS — M25661 Stiffness of right knee, not elsewhere classified: Secondary | ICD-10-CM | POA: Diagnosis not present

## 2016-04-03 DIAGNOSIS — R262 Difficulty in walking, not elsewhere classified: Secondary | ICD-10-CM | POA: Diagnosis not present

## 2016-04-05 DIAGNOSIS — M6281 Muscle weakness (generalized): Secondary | ICD-10-CM | POA: Diagnosis not present

## 2016-04-05 DIAGNOSIS — M25661 Stiffness of right knee, not elsewhere classified: Secondary | ICD-10-CM | POA: Diagnosis not present

## 2016-04-05 DIAGNOSIS — R262 Difficulty in walking, not elsewhere classified: Secondary | ICD-10-CM | POA: Diagnosis not present

## 2016-04-05 DIAGNOSIS — M25561 Pain in right knee: Secondary | ICD-10-CM | POA: Diagnosis not present

## 2016-04-10 DIAGNOSIS — R262 Difficulty in walking, not elsewhere classified: Secondary | ICD-10-CM | POA: Diagnosis not present

## 2016-04-10 DIAGNOSIS — M6281 Muscle weakness (generalized): Secondary | ICD-10-CM | POA: Diagnosis not present

## 2016-04-10 DIAGNOSIS — M25661 Stiffness of right knee, not elsewhere classified: Secondary | ICD-10-CM | POA: Diagnosis not present

## 2016-04-10 DIAGNOSIS — M25561 Pain in right knee: Secondary | ICD-10-CM | POA: Diagnosis not present

## 2016-04-11 DIAGNOSIS — G5601 Carpal tunnel syndrome, right upper limb: Secondary | ICD-10-CM | POA: Diagnosis not present

## 2016-04-12 DIAGNOSIS — M25661 Stiffness of right knee, not elsewhere classified: Secondary | ICD-10-CM | POA: Diagnosis not present

## 2016-04-12 DIAGNOSIS — M6281 Muscle weakness (generalized): Secondary | ICD-10-CM | POA: Diagnosis not present

## 2016-04-12 DIAGNOSIS — M25561 Pain in right knee: Secondary | ICD-10-CM | POA: Diagnosis not present

## 2016-04-12 DIAGNOSIS — R262 Difficulty in walking, not elsewhere classified: Secondary | ICD-10-CM | POA: Diagnosis not present

## 2016-04-15 DIAGNOSIS — M25561 Pain in right knee: Secondary | ICD-10-CM | POA: Diagnosis not present

## 2016-04-16 DIAGNOSIS — M25661 Stiffness of right knee, not elsewhere classified: Secondary | ICD-10-CM | POA: Diagnosis not present

## 2016-04-16 DIAGNOSIS — M25561 Pain in right knee: Secondary | ICD-10-CM | POA: Diagnosis not present

## 2016-04-16 DIAGNOSIS — R262 Difficulty in walking, not elsewhere classified: Secondary | ICD-10-CM | POA: Diagnosis not present

## 2016-04-16 DIAGNOSIS — M6281 Muscle weakness (generalized): Secondary | ICD-10-CM | POA: Diagnosis not present

## 2016-04-18 DIAGNOSIS — M545 Low back pain: Secondary | ICD-10-CM | POA: Diagnosis not present

## 2016-04-18 DIAGNOSIS — M25661 Stiffness of right knee, not elsewhere classified: Secondary | ICD-10-CM | POA: Diagnosis not present

## 2016-04-18 DIAGNOSIS — M6281 Muscle weakness (generalized): Secondary | ICD-10-CM | POA: Diagnosis not present

## 2016-04-18 DIAGNOSIS — M25561 Pain in right knee: Secondary | ICD-10-CM | POA: Diagnosis not present

## 2016-04-23 DIAGNOSIS — M25561 Pain in right knee: Secondary | ICD-10-CM | POA: Diagnosis not present

## 2016-04-23 DIAGNOSIS — M25661 Stiffness of right knee, not elsewhere classified: Secondary | ICD-10-CM | POA: Diagnosis not present

## 2016-04-23 DIAGNOSIS — M6281 Muscle weakness (generalized): Secondary | ICD-10-CM | POA: Diagnosis not present

## 2016-04-23 DIAGNOSIS — R262 Difficulty in walking, not elsewhere classified: Secondary | ICD-10-CM | POA: Diagnosis not present

## 2016-04-25 ENCOUNTER — Other Ambulatory Visit: Payer: Self-pay | Admitting: Family Medicine

## 2016-04-25 DIAGNOSIS — M6281 Muscle weakness (generalized): Secondary | ICD-10-CM | POA: Diagnosis not present

## 2016-04-25 DIAGNOSIS — M25661 Stiffness of right knee, not elsewhere classified: Secondary | ICD-10-CM | POA: Diagnosis not present

## 2016-04-25 DIAGNOSIS — M25561 Pain in right knee: Secondary | ICD-10-CM | POA: Diagnosis not present

## 2016-04-25 DIAGNOSIS — R262 Difficulty in walking, not elsewhere classified: Secondary | ICD-10-CM | POA: Diagnosis not present

## 2016-05-22 DIAGNOSIS — M6281 Muscle weakness (generalized): Secondary | ICD-10-CM | POA: Diagnosis not present

## 2016-05-22 DIAGNOSIS — M25661 Stiffness of right knee, not elsewhere classified: Secondary | ICD-10-CM | POA: Diagnosis not present

## 2016-05-22 DIAGNOSIS — R262 Difficulty in walking, not elsewhere classified: Secondary | ICD-10-CM | POA: Diagnosis not present

## 2016-05-22 DIAGNOSIS — M25561 Pain in right knee: Secondary | ICD-10-CM | POA: Diagnosis not present

## 2016-05-24 DIAGNOSIS — M6281 Muscle weakness (generalized): Secondary | ICD-10-CM | POA: Diagnosis not present

## 2016-05-24 DIAGNOSIS — M545 Low back pain: Secondary | ICD-10-CM | POA: Diagnosis not present

## 2016-05-24 DIAGNOSIS — M25661 Stiffness of right knee, not elsewhere classified: Secondary | ICD-10-CM | POA: Diagnosis not present

## 2016-05-24 DIAGNOSIS — M25561 Pain in right knee: Secondary | ICD-10-CM | POA: Diagnosis not present

## 2016-05-27 DIAGNOSIS — M25561 Pain in right knee: Secondary | ICD-10-CM | POA: Diagnosis not present

## 2016-05-30 DIAGNOSIS — Z01818 Encounter for other preprocedural examination: Secondary | ICD-10-CM | POA: Insufficient documentation

## 2016-05-30 NOTE — Assessment & Plan Note (Signed)
02/20/16: BP historically has been very well controlled.  up today b/c didn't take meds this am.   No change.   Pt to monitor at home to ensure remains well controlled of less than 140/90 as her usual.  - take meds in am prior to sx unless told otherwise by surgeon/ anesthesia

## 2016-05-30 NOTE — Assessment & Plan Note (Signed)
Stable

## 2016-05-30 NOTE — Assessment & Plan Note (Addendum)
EKG done in office today. -- > NSR; bpm 63 w/o ST or T-wave ABN appreciated.  CBC, CMP, in prep for SX; obtain vit D today as well since needed for chronic dx mgt  METS- good tolerance and appropriate for age, no need ESE etc

## 2016-06-03 ENCOUNTER — Other Ambulatory Visit: Payer: Self-pay | Admitting: Family Medicine

## 2016-06-03 DIAGNOSIS — M6281 Muscle weakness (generalized): Secondary | ICD-10-CM | POA: Diagnosis not present

## 2016-06-03 DIAGNOSIS — R262 Difficulty in walking, not elsewhere classified: Secondary | ICD-10-CM | POA: Diagnosis not present

## 2016-06-03 DIAGNOSIS — M25561 Pain in right knee: Secondary | ICD-10-CM | POA: Diagnosis not present

## 2016-06-03 DIAGNOSIS — M25661 Stiffness of right knee, not elsewhere classified: Secondary | ICD-10-CM | POA: Diagnosis not present

## 2016-06-03 MED ORDER — AMLODIPINE BESYLATE 5 MG PO TABS: 5.0000 mg | ORAL_TABLET | Freq: Every day | ORAL | 1 refills | Status: DC

## 2016-06-03 NOTE — Telephone Encounter (Signed)
Afternoon [name], She received last Rx 1/25//18 90 day supply So next refill isn't due until 06/12/16- which is 9 days from now. Dr. Raliegh Scarlet can refill it at that time. Thanks! Valetta Fuller

## 2016-06-03 NOTE — Telephone Encounter (Signed)
Patient is requesting a refill for her amlodipine 5mg , oxybutynin 10 mg, trazodone 100 mg.

## 2016-06-03 NOTE — Telephone Encounter (Signed)
We have not prescribed this medication for the patient previously.  Please review and refill if appropriate.  T. Quame Spratlin, CMA  

## 2016-06-03 NOTE — Addendum Note (Signed)
Addended by: Fonnie Mu on: 06/03/2016 10:21 AM   Modules accepted: Orders

## 2016-06-03 NOTE — Telephone Encounter (Signed)
Please review refill request and refill if appropriate.  Charyl Bigger, CMA

## 2016-06-06 DIAGNOSIS — M6281 Muscle weakness (generalized): Secondary | ICD-10-CM | POA: Diagnosis not present

## 2016-06-06 DIAGNOSIS — R262 Difficulty in walking, not elsewhere classified: Secondary | ICD-10-CM | POA: Diagnosis not present

## 2016-06-06 DIAGNOSIS — M25561 Pain in right knee: Secondary | ICD-10-CM | POA: Diagnosis not present

## 2016-06-06 DIAGNOSIS — M25661 Stiffness of right knee, not elsewhere classified: Secondary | ICD-10-CM | POA: Diagnosis not present

## 2016-06-10 ENCOUNTER — Other Ambulatory Visit: Payer: Self-pay | Admitting: Family Medicine

## 2016-07-18 ENCOUNTER — Telehealth: Payer: Self-pay | Admitting: Family Medicine

## 2016-07-18 MED ORDER — TRAZODONE HCL 100 MG PO TABS: ORAL_TABLET | ORAL | 0 refills | Status: DC

## 2016-07-18 MED ORDER — OXYBUTYNIN CHLORIDE ER 10 MG PO TB24: 10.0000 mg | ORAL_TABLET | Freq: Every day | ORAL | 0 refills | Status: DC

## 2016-07-18 NOTE — Addendum Note (Signed)
Addended by: Amado Coe on: 07/18/2016 11:14 AM   Modules accepted: Orders

## 2016-07-18 NOTE — Telephone Encounter (Signed)
Patient is in [place] for an extended period of time (she is helping her daughter who's husband is in Hospice care). She is out of the meds that she brought with her and her husband forgot to bring her bottles when he came a week or so ago. She has no way to get these meds to [place] and is needing a refill sent to a pharmacy in [place]. She needs a refill of the trazodone and oxybutynin. The [facility] and their phone number is [phone]

## 2016-07-18 NOTE — Telephone Encounter (Signed)
Oxybutynin and trazodone sent to [facility] [place].  Patient aware follow up appointment is needed.

## 2016-07-18 NOTE — Addendum Note (Signed)
Addended by: Amado Coe on: 07/18/2016 01:10 PM   Modules accepted: Orders

## 2016-07-26 NOTE — Addendum Note (Signed)
Addendum  created 07/26/16 0919 by Lyn Hollingshead, MD   Sign clinical note

## 2016-08-13 ENCOUNTER — Other Ambulatory Visit: Payer: Self-pay | Admitting: Family Medicine

## 2016-08-13 MED ORDER — AMLODIPINE BESYLATE 5 MG PO TABS: 5.0000 mg | ORAL_TABLET | Freq: Every day | ORAL | 1 refills | Status: DC

## 2016-08-13 NOTE — Telephone Encounter (Signed)
Patient called stating she is still in [place] helping out her daughter (son in law passed away a short time ago). She says she is almost out of her amlodipine and is requesting a refill [facility] to [facility]. Store number is [phone] and their phone number is [phone].

## 2016-08-13 NOTE — Addendum Note (Signed)
Addended by: Amado Coe on: 08/13/2016 12:14 PM   Modules accepted: Orders

## 2016-08-13 NOTE — Telephone Encounter (Signed)
[facility] to [facility].  Patient aware.

## 2016-09-03 ENCOUNTER — Encounter: Payer: Self-pay | Admitting: Family Medicine

## 2016-09-03 ENCOUNTER — Ambulatory Visit (INDEPENDENT_AMBULATORY_CARE_PROVIDER_SITE_OTHER): Payer: PPO | Admitting: Family Medicine

## 2016-09-03 VITALS — BP 129/83 | HR 86 | Ht 65.25 in | Wt 217.0 lb

## 2016-09-03 DIAGNOSIS — E559 Vitamin D deficiency, unspecified: Secondary | ICD-10-CM

## 2016-09-03 DIAGNOSIS — F32A Depression, unspecified: Secondary | ICD-10-CM

## 2016-09-03 DIAGNOSIS — R7303 Prediabetes: Secondary | ICD-10-CM

## 2016-09-03 DIAGNOSIS — I10 Essential (primary) hypertension: Secondary | ICD-10-CM

## 2016-09-03 DIAGNOSIS — F5102 Adjustment insomnia: Secondary | ICD-10-CM

## 2016-09-03 DIAGNOSIS — N3946 Mixed incontinence: Secondary | ICD-10-CM

## 2016-09-03 DIAGNOSIS — F329 Major depressive disorder, single episode, unspecified: Secondary | ICD-10-CM

## 2016-09-03 LAB — POCT GLYCOSYLATED HEMOGLOBIN (HGB A1C): Hemoglobin A1C: 5.9

## 2016-09-03 MED ORDER — AMLODIPINE BESYLATE 5 MG PO TABS: 5.0000 mg | ORAL_TABLET | Freq: Every day | ORAL | 1 refills | Status: DC

## 2016-09-03 MED ORDER — LOSARTAN POTASSIUM-HCTZ 100-25 MG PO TABS: 1.0000 | ORAL_TABLET | Freq: Every day | ORAL | 1 refills | Status: DC

## 2016-09-03 NOTE — Progress Notes (Signed)
Impression and Recommendations:    1. Essential hypertension, benign   2. Mixed stress and urge urinary incontinence   3. Morbid obesity ([place])   4. Prediabetes   5. Vitamin D deficiency   6. Adjustment insomnia   7. Depression, unspecified depression type    The patient was counseled, risk factors were discussed, anticipatory guidance given.  -A1c 5.9 today.  We will monitor in 6 months   - see AVS for further details of care, As well as orders placed during this office visit  Modified Medications   Modified Medication Previous Medication   AMLODIPINE (NORVASC) 5 MG TABLET amLODipine (NORVASC) 5 MG tablet      Take 1 tablet (5 mg total) by mouth daily before breakfast.    Take 1 tablet (5 mg total) by mouth daily before breakfast.   LOSARTAN-HYDROCHLOROTHIAZIDE (HYZAAR) 100-25 MG TABLET losartan-hydrochlorothiazide (HYZAAR) 100-25 MG tablet      Take 1 tablet by mouth daily before breakfast.    Take 1 tablet by mouth daily before breakfast.    Orders Placed This Encounter  Procedures  . Ambulatory referral to Urology    Gross side effects, risk and benefits, and alternatives of medications and treatment plan in general discussed with patient.  Patient is aware that all medications have potential side effects and we are unable to predict every side effect or drug-drug interaction that may occur.   Patient will call with any questions prior to using medication if they have concerns.  Expresses verbal understanding and consents to current therapy and treatment regimen.  No barriers to understanding were identified.  Red flag symptoms and signs discussed in detail.  Patient expressed understanding regarding what to do in case of emergency\urgent symptoms  Please see AVS handed out to patient at the end of our visit for further patient instructions/ counseling done pertaining to today's office visit.   Return for Follow-up 4 weeks-mood, walking program, sleep.     Note:  This document was prepared using Dragon voice recognition software and may include unintentional dictation errors.  Shelly Hickman 11:28 AM --------------------------------------------------------------------------------------------------------------------------------------------------------------------------------------------------------------------------------------------    Subjective:    CC:  Chief Complaint  Patient presents with  . Follow-up    HPI: Shelly Hickman is a 76 y.o. female who presents to [facility] today for issues as discussed below.  Son-in-law died in [place] in 08/31/22- recently.  Daughter doing ok.    She has been depressed since she got home.  On cymblata 120mg  qd.    Prediabetes: Patient last A1c was done on 1\16\18 and it was found to be 5.9;   6 months ago. Pt has been doing W.  Urinating all night long- peeing 2-3 per nite.  Has been as bad  As 5-6 times per night.  She's been on the Ditropan XL for many years at least 3 and would like the consultation of a urologist to see what else can be done  Sleep: Taking trazadone 150mg  nitely to help her sleep.   Problem  Depression     Wt Readings from Last 3 Encounters:  09/03/16 217 lb (98.4 kg)  03/04/16 215 lb (97.5 kg)  02/22/16 215 lb 4.8 oz (97.7 kg)   BP Readings from Last 3 Encounters:  09/03/16 129/83  03/07/16 (!) 104/48  02/22/16 (!) 132/99   Pulse Readings from Last 3 Encounters:  09/03/16 86  03/07/16 70  02/22/16 89   BMI Readings from  Last 3 Encounters:  09/03/16 35.83 kg/m  03/04/16 34.70 kg/m  02/22/16 34.75 kg/m     Patient Care Team    Relationship Specialty Notifications Start End  Mellody Dance, DO PCP - General Family Medicine  08/30/15   Almedia Balls, MD Consulting Physician Orthopedic Surgery  10/18/15   Kristeen Miss, MD Consulting Physician Neurosurgery  10/18/15    Comment: spine sx  Allyn Kenner, MD Consulting Physician  Dermatology  10/18/15    Comment: skin screening   Elsie Saas, MD Consulting Physician Orthopedic Surgery  02/20/16      Patient Active Problem List   Diagnosis Date Noted  . Morbid obesity ([place]) 09/09/2015    Priority: High  . Prediabetes 09/06/2015    Priority: High  . Hypertriglyceridemia 09/06/2015    Priority: High  . HLD (hyperlipidemia) 08/30/2015    Priority: High  . Essential hypertension, benign 07/14/2013    Priority: High  . Anxiety 07/14/2013    Priority: High  . Insomnia 07/14/2013    Priority: High  . Depression 03/26/2011    Priority: High  . Low HDL (under 40) 09/06/2015    Priority: Medium  . Incontinence of urine 09/05/2013    Priority: Medium  . GERD (gastroesophageal reflux disease) 12/10/2012    Priority: Medium  . Pre-op evaluation 05/30/2016  . 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 Encounter for wellness examination 12/31/2015  . Primary localized osteoarthritis of right knee 12/12/2015  . Spinal stenosis in cervical region 10/18/2015  . Spinal stenosis of lumbar region 10/18/2015  . Memory impairment 08/30/2015  . Vitamin D deficiency 08/30/2015  . Status post left hip replacement 07/14/2013  . Constipation 07/14/2013  . Allergic rhinitis 07/14/2013  . Osteopenia 06/27/2011  . Lacrimal canalicular stenosis 46/50/3546  . Degenerative joint disease involving multiple joints 04/06/2010    Past Medical history, Surgical history, Family history, Social history, Allergies and Medications have been entered into the medical record, reviewed and changed as needed.    Current Meds  Medication Sig  . acetaminophen (TYLENOL) 325 MG tablet Take 2 tablets (650 mg total) by mouth every 6 (six) hours as needed for mild pain (or Fever >/= 101).  Marland Kitchen amLODipine (NORVASC) 5 MG tablet Take 1 tablet (5 mg total) by mouth daily before breakfast.  . aspirin EC 325 MG EC tablet 1 tab a day for the next 30 days to prevent blood clots  . Calcium  Carb-Cholecalciferol (CALCIUM 600-D PO) Take 1 tablet by mouth daily.  . calcium carbonate (TUMS EX) 750 MG chewable tablet Chew 2 tablets by mouth daily as needed for heartburn.  . Cholecalciferol (VITAMIN D-3) 5000 units TABS Take 5,000 Units by mouth daily.  . clindamycin (CLEOCIN) 300 MG capsule Take 1 capsule (300 mg total) by mouth every 6 (six) hours.  . docusate sodium (COLACE) 100 MG capsule 1 tab 2 times a day while on narcotics.  STOOL SOFTENER  . DULoxetine (CYMBALTA) 60 MG capsule TAKE 2 CAPSULES BY MOUTH DAILY BEFORE BREAKFAST  . fluticasone (CUTIVATE) 0.05 % cream Apply 1 application topically 2 (two) times daily as needed (irritation).   . fluticasone (FLONASE) 50 MCG/ACT nasal spray Place 2 sprays into the nose daily.  Marland Kitchen gabapentin (NEURONTIN) 300 MG capsule TAKE 3 CAPSULES BY MOUTH 3 TIMES DAILY  . ketoconazole (NIZORAL) 2 % cream Apply 1 application topically 2 (two) times daily as needed for irritation.  Marland Kitchen losartan-hydrochlorothiazide (HYZAAR) 100-25 MG tablet Take 1 tablet by mouth daily before  breakfast.  . mineral oil-hydrophilic petrolatum (AQUAPHOR) ointment Apply 1 application topically as needed for dry skin.  . montelukast (SINGULAIR) 10 MG tablet Take 10 mg by mouth daily before breakfast.   . niacin (NIASPAN) 500 MG CR tablet Take 1 tablet by mouth at bedtime.   Marland Kitchen omega-3 acid ethyl esters (LOVAZA) 1 g capsule Take 2 capsules nightly before bed  . oxybutynin (DITROPAN-XL) 10 MG 24 hr tablet Take 1 tablet (10 mg total) by mouth daily.  Marland Kitchen oxyCODONE (OXY IR/ROXICODONE) 5 MG immediate release tablet 1-2 tablets every 4-6 hrs as needed for pain  . polyethylene glycol (MIRALAX / GLYCOLAX) packet 17grams in 6 oz of water twice a day until bowel movement.  LAXITIVE.  Restart if two days since last bowel movement  . simvastatin (ZOCOR) 40 MG tablet Take 1 tablet (40 mg total) by mouth at bedtime.  . traZODone (DESYREL) 100 MG tablet TAKE 1 TABLET BY MOUTH EACH NIGHT AT BEDTIME   . [DISCONTINUED] amLODipine (NORVASC) 5 MG tablet Take 1 tablet (5 mg total) by mouth daily before breakfast.  . [DISCONTINUED] losartan-hydrochlorothiazide (HYZAAR) 100-25 MG tablet Take 1 tablet by mouth daily before breakfast.    Allergies:  Allergies  Allergen Reactions  . Avelox [Moxifloxacin Hcl In Nacl] Other (See Comments)    CHEST PAIN OF UNSPECIFIED ORIGIN  . Cephalosporins Hives and Itching  . Other Itching and Swelling    UNSPECIFIED INSECT STINGS     Review of Systems: General:   Denies fever, chills, unexplained weight loss.  Optho/Auditory:   Denies visual changes, blurred vision/LOV Respiratory:   Denies wheeze, DOE more than baseline levels.  Cardiovascular:   Denies chest pain, palpitations, new onset peripheral edema  Gastrointestinal:   Denies nausea, vomiting, diarrhea, abd pain.  Genitourinary: Denies dysuria, freq/ urgency, flank pain or discharge from genitals.  Endocrine:     Denies hot or cold intolerance, polyuria, polydipsia. Musculoskeletal:   Denies unexplained myalgias, joint swelling, unexplained arthralgias, gait problems.  Skin:  Denies new onset rash, suspicious lesions Neurological:     Denies dizziness, unexplained weakness, numbness  Psychiatric/Behavioral:   Denies mood changes, suicidal or homicidal ideations, hallucinations    Objective:   Blood pressure 129/83, pulse 86, height 5' 5.25" (1.657 m), weight 217 lb (98.4 kg). Body mass index is 35.83 kg/m. General:  Well Developed, well nourished, appropriate for stated age.  Neuro:  Alert and oriented,  extra-ocular muscles intact  HEENT:  Normocephalic, atraumatic, neck supple, no carotid bruits appreciated  Skin:  no gross rash, warm, pink. Cardiac:  RRR, S1 S2 Respiratory:  ECTA B/L and A/P, Not using accessory muscles, speaking in full sentences- unlabored. Vascular:  Ext warm, no cyanosis apprec.; cap RF less 2 sec. Psych:  No HI/SI, judgement and insight good, Euthymic mood.  Full Affect.

## 2016-09-03 NOTE — Patient Instructions (Addendum)
Please look into hospice for free grieving counseling.  They have group therapies and you may qualify to be a part of them.  Goal is to walk 10 minutes twice daily    Behavioral Health/Psychiatry Referrals   Wilber Oliphant, MSW [address] [place]   [facility] Apolonio Schneiders, PhD [address], [facility] [phone]  [place] [facility] [address] [place], [place] [phone]  Lanelle Bal Professional Counselor Counseling and [employer] [place] abuse [facility] Manager [address], [place] [phone] [place] [address] [place] Delmer Islam, PhD Oneida Arenas, PhD Leitha Bleak, LCSW Jillene Bucks, PhD-child, adolescent and adults  [facility] [address], [place] [phone] Merilyn Baba, MS-child, adolescent and adults Lennart Pall, PhD-adolescent and adults  [facility], [facility] [place], [place] [address], [employer] [address] [place]  [place], [place]  [facility] [address] [place] [phone]  [facility] [place] [address], [place] [phone]  [facility] [address], [place]  [facility] [address], [place]   What is Chronic Stress Syndrome, Symptoms & Ways to Deal With it   What is Chronic Stress Syndrome?  Chronic Stress Syndrome is something which  can now be called as a medical condition due to the amount of stress an individual is going through these days. Chronic Stress Syndrome causes the body and mind to shutdown and the person has no control over himself or herself. Due to the demands of modern day life and the hardship throughout day and night takes its toll over a period of time and the body and brain starts demanding rest and a break. This leads to certain symptoms where your performance level starts to dip at work, you become irritable both at work and at home, you may stop enjoying activities you previously liked, you may become depressed, you may get angry for even small things. Chronic Stress Syndrome can significantly impact your quality life. Thus it is important understand the symptoms of Chronic Stress Syndrome and react accordingly in order to cope up with it.  It is important to note here that a balanced work-home equation should be drawn to cut down symptoms of Chronic Stress Syndrome. Minor stressors can be overcome by the body's inbuilt stress response but when there is unending stress for a long period of time then an external help is required to ease the stress.  Chronic Stress Syndrome can physically and psychologically drain you over a period of time. For such cases stress management is the best way to cope up with Chronic Stress Syndrome. If Chronic Stress Syndrome is not treated then it may result in many health hazards like anxiety, muscle pain, insomnia, and high blood pressure along with a compromised immune system leading to frequent infections and missed days from work.    What are the Symptoms of Chronic Stress Syndrome?   The symptoms of Chronic Stress Syndrome are variable and range from generalized symptoms to emotional symptoms along with behavioral and cognitive symptoms. Some of these symptoms have been delineated below:  Generalized Symptoms of Chronic Stress Syndrome are: Anxiety Depression Social  isolation Headache Abdominal pain Lack of sleep Back pain Difficulty in concentrating Hypertension Hemorrhoids Varicose veins Panic attacks/ Panic disorder Cardiovascular diseases.   Some of the Emotional Symptoms of Chronic Stress Syndrome are: To become easily agitated, moody and frustrated Feeling overwhelmed which makes you feel like you are losing control. Having difficulty relaxing and have a peaceful mind Having low self esteem Feeling lonely Feeling worthless Feeling depressed Avoiding social environment.   Some of the Physical Symptoms of Chronic Stress Syndrome are: Headaches Lethargy Alternating diarrhea and constipation Nausea Muscles aches and pains Insomnia Rapid heartbeat and chest pain Infections and frequent colds Decreased libido Nervousness and shaking Tinnitus Sweaty palms Dry mouth Clenched jaw.  Some of the Cognitive Symptoms of Chronic Stress Syndrome are: Constant worrying Racing thoughts Disorganization and forgetfulness Inability to focus Poor judgment Abundance of negativity.  Some of the Behavioral Symptoms of Chronic Stress Syndrome are: Changes in appetite with less desire to eat Avoiding responsibilities Indulgence in alcohol or recreational drug use Increased nail biting and being fidgety Ways to Deal With Chronic Stress Syndrome    Chronic Stress Syndrome is not something which cannot be addressed. A bit of effort from your side in the form of lifestyle modifications, a little bit of exercise, a balanced work life equation can do wonders and help you get rid of Chronic Stress Syndrome.  Get Proper Sleep: It has been proved that Chronic Stress Syndrome causes loss of sleep where an individual may not even be able to sleep for days unending. This may result in the individual feeling lethargic and unable to focus at work the following morning. This may lead to decreased performance at work. Thus, it is important to have a good  sleep-wake cycle. For this, try and not drink any caffeinated beverage about four hours prior to going to sleep, as caffeine pumps up the adrenaline and causes you to stay awake resulting ultimately in Chronic Stress Syndrome.  Avoid Alcohol and Drugs: Another way to get rid of Chronic Stress Syndrome is lifestyle modifications. Stay away from alcohol and other recreational drugs. Take Short Frequent Breaks at Work: Try to take frequent breaks from work and do not work continuously. Try and manage your work in such a way that you even meet your deadline and come home on time for a happy dinner with family. A good time spent with family and kids does wonders in not only dealing with Chronic Stress Syndrome but also preventing it.  Become Physically Active: Another step towards getting rid of Chronic Stress Syndrome is physical activity. If you do not have time to spend at the gym then at least try and go for daily walks for about half an hour a day which not only keeps the stress away but also is good for your overall health. Physical activity leads to production of endorphins which will make you feel relaxed and feel good.  Healthy Diet Can Help You Deal With Chronic Stress Syndrome: Have a balanced and healthy diet is another step towards a stress free life and keeping Chronic Stress Syndrome at [place]. If time is a constraint then you can try eating three small meals a day. Try and avoid fast foods and take foods which are healthy and rich in proteins, fiber, and carbohydrates to boost your energy system.  Music Can Soothe Your Mind: Light music is one of the best and most effective relaxation techniques that one can try to overcome stress. It has shown to calm down the  mind and take you away from all the stressors that you may be having. These days it is also being used as a therapy in some institutes for overcoming stress. It is important here to discuss the importance of a good social support system for  patients with Chronic Stress Syndrome, as a good social support framework can do wonders in taking the stress away from the patient and overcoming Chronic Stress Syndrome.  Meditation Can Help You Deal With Chronic Stress Syndrome Effectively: Meditation and yoga has also shown to be quite effective in relaxing the mind and coping up with Chronic Stress Syndrome   In cases where these measures are not helpful, then it is time for you to consult with a skilled psychologist or a psychiatrist for potential therapies or medications to control the stress response.   The psychologist can help you with a variety of steps for coping up with Chronic Stress Syndrome. Relaxation techniques and behavioral therapy are some of the methods employed by psychologists. In some cases, medications can also be given to help relax the patient.  Since Chronic Stress Syndrome is both emotionally and physically draining for the patient and it also adversely affects the family life of the patient hence it is important for the patient to recognize the condition and taking steps to cope up with it. Escaping measures like alcohol and drug use are of no help as they only aggravate the condition apart from their other health hazards. If this condition is ignored or left untreated it can lead to various medical conditions like anxiety and depression and various other medical conditions.  Last but not least, smile as often as you can as it is the best gift that you can give to someone. The best way to stay relaxed is to have a good smile, exercise daily, spend time with your family, meditation and if required consultation with a good psychologist so that you can live a stress free life and overcome the symptoms of Chronic Stress Syndrome.

## 2016-09-05 DIAGNOSIS — H02831 Dermatochalasis of right upper eyelid: Secondary | ICD-10-CM | POA: Diagnosis not present

## 2016-09-05 DIAGNOSIS — H02834 Dermatochalasis of left upper eyelid: Secondary | ICD-10-CM | POA: Diagnosis not present

## 2016-09-05 DIAGNOSIS — H2513 Age-related nuclear cataract, bilateral: Secondary | ICD-10-CM | POA: Diagnosis not present

## 2016-09-05 DIAGNOSIS — H04123 Dry eye syndrome of bilateral lacrimal glands: Secondary | ICD-10-CM | POA: Diagnosis not present

## 2016-09-05 DIAGNOSIS — D3132 Benign neoplasm of left choroid: Secondary | ICD-10-CM | POA: Diagnosis not present

## 2016-10-04 ENCOUNTER — Ambulatory Visit (INDEPENDENT_AMBULATORY_CARE_PROVIDER_SITE_OTHER): Payer: PPO | Admitting: Family Medicine

## 2016-10-04 ENCOUNTER — Encounter: Payer: Self-pay | Admitting: Family Medicine

## 2016-10-04 VITALS — BP 130/83 | HR 80 | Ht 65.25 in | Wt 214.9 lb

## 2016-10-04 DIAGNOSIS — E559 Vitamin D deficiency, unspecified: Secondary | ICD-10-CM

## 2016-10-04 DIAGNOSIS — F32A Depression, unspecified: Secondary | ICD-10-CM

## 2016-10-04 DIAGNOSIS — Z114 Encounter for screening for human immunodeficiency virus [HIV]: Secondary | ICD-10-CM

## 2016-10-04 DIAGNOSIS — E781 Pure hyperglyceridemia: Secondary | ICD-10-CM

## 2016-10-04 DIAGNOSIS — Z1159 Encounter for screening for other viral diseases: Secondary | ICD-10-CM

## 2016-10-04 DIAGNOSIS — R7303 Prediabetes: Secondary | ICD-10-CM | POA: Diagnosis not present

## 2016-10-04 DIAGNOSIS — E782 Mixed hyperlipidemia: Secondary | ICD-10-CM

## 2016-10-04 DIAGNOSIS — F5102 Adjustment insomnia: Secondary | ICD-10-CM

## 2016-10-04 DIAGNOSIS — F419 Anxiety disorder, unspecified: Secondary | ICD-10-CM

## 2016-10-04 DIAGNOSIS — I10 Essential (primary) hypertension: Secondary | ICD-10-CM | POA: Diagnosis not present

## 2016-10-04 DIAGNOSIS — F329 Major depressive disorder, single episode, unspecified: Secondary | ICD-10-CM

## 2016-10-04 NOTE — Progress Notes (Signed)
Impression and Recommendations:    1. Anxiety   2. Depression, unspecified depression type   3. Prediabetes   4. Essential hypertension, benign   5. Mixed hyperlipidemia   6. Hypertriglyceridemia   7. Adjustment insomnia   8. Morbid obesity ([place])   9. Screening for HIV without presence of risk factors   10. Need for hepatitis C screening test   11. Vitamin D deficiency      Prediabetes A1c a couple weeks ago was 5.9.  We will hold off on labs\recheck of that for at least 4-6 months.  Discussed with patient diet and lifestyle changes to help prevent onset diabetes   The patient was counseled, risk factors were discussed, anticipatory guidance given.   New Prescriptions   No medications on file     Discontinued Medications   ASPIRIN EC 325 MG EC TABLET    1 tab a day for the next 30 days to prevent blood clots   CLINDAMYCIN (CLEOCIN) 300 MG CAPSULE    Take 1 capsule (300 mg total) by mouth every 6 (six) hours.   DOCUSATE SODIUM (COLACE) 100 MG CAPSULE    1 tab 2 times a day while on narcotics.  STOOL SOFTENER   OXYBUTYNIN (DITROPAN-XL) 10 MG 24 HR TABLET    Take 1 tablet (10 mg total) by mouth daily.   OXYCODONE (OXY IR/ROXICODONE) 5 MG IMMEDIATE RELEASE TABLET    1-2 tablets every 4-6 hrs as needed for pain   POLYETHYLENE GLYCOL (MIRALAX / GLYCOLAX) PACKET    17grams in 6 oz of water twice a day until bowel movement.  LAXITIVE.  Restart if two days since last bowel movement     Modified Medications   No medications on file      Orders Placed This Encounter  Procedures  . CBC with Differential/Platelet  . Comprehensive metabolic panel  . Hepatitis C antibody  . HIV antibody  . Lipid panel  . VITAMIN D 25 Hydroxy (Vit-D Deficiency, Fractures)     Gross side effects, risk and benefits, and alternatives of medications and treatment plan in general discussed with patient.  Patient is aware that all medications have potential side effects and we are unable  to predict every side effect or drug-drug interaction that may occur.   Patient will call with any questions prior to using medication if they have concerns.  Expresses verbal understanding and consents to current therapy and treatment regimen.  No barriers to understanding were identified.  Red flag symptoms and signs discussed in detail.  Patient expressed understanding regarding what to do in case of emergency\urgent symptoms  Please see AVS handed out to patient at the end of our visit for further patient instructions/ counseling done pertaining to today's office visit.   Return for Medicare wellness visit & yrly screen near future- 12/28/16.     Note: This document was prepared using Dragon voice recognition software and may include unintentional dictation errors.  Shelly Hickman 11:22 AM --------------------------------------------------------------------------------------------------------------------------------------------------------------------------------------------------------------------------------------------    Subjective:    CC:  Chief Complaint  Patient presents with  . Follow-up    HPI: Shelly Hickman is a 76 y.o. female who presents to [place] [facility] today for issues as discussed below.  Pt was having an emotional difficult time with son-in-laws' recent death and helping d deal with it.  Pt sleeping well.  Using trazadone prn sleep and working well.  Occ doesn't even need it.  Not exercise  yet on regular basis- but plans to go.   Things at home with husband are status quo.    Upcoming appt Sept 11th- [facility]- Dr McDermont   Problem  Prediabetes  Depression   Feels like she is doing emotionally better.  Spoke with pastor- gettign counseling  Daughter doing well and focusing on her 69yo D who sshe eneds to be strong for.  Pt not walking yet much but plans to [facility] the [facility].         Wt Readings from Last 3 Encounters:   10/04/16 214 lb 14.4 oz (97.5 kg)  09/03/16 217 lb (98.4 kg)  03/04/16 215 lb (97.5 kg)   BP Readings from Last 3 Encounters:  10/04/16 130/83  09/03/16 129/83  03/07/16 (!) 104/48   Pulse Readings from Last 3 Encounters:  10/04/16 80  09/03/16 86  03/07/16 70   BMI Readings from Last 3 Encounters:  10/04/16 35.49 kg/m  09/03/16 35.83 kg/m  03/04/16 34.70 kg/m     Patient Care Team    Relationship Specialty Notifications Start End  Mellody Dance, DO PCP - General Family Medicine  08/30/15   Almedia Balls, MD Consulting Physician Orthopedic Surgery  10/18/15   Kristeen Miss, MD Consulting Physician Neurosurgery  10/18/15    Comment: spine sx  Allyn Kenner, MD Consulting Physician Dermatology  10/18/15    Comment: skin screening   Elsie Saas, MD Consulting Physician Orthopedic Surgery  02/20/16      Patient Active Problem List   Diagnosis Date Noted  . Morbid obesity ([place]) 09/09/2015    Priority: High  . Prediabetes 09/06/2015    Priority: High  . Hypertriglyceridemia 09/06/2015    Priority: High  . HLD (hyperlipidemia) 08/30/2015    Priority: High  . Essential hypertension, benign 07/14/2013    Priority: High  . Anxiety 07/14/2013    Priority: High  . Insomnia 07/14/2013    Priority: High  . Depression 03/26/2011    Priority: High  . Low HDL (under 40) 09/06/2015    Priority: Medium  . Incontinence of urine 09/05/2013    Priority: Medium  . GERD (gastroesophageal reflux disease) 12/10/2012    Priority: Medium  . Pre-op evaluation 05/30/2016  . 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 Encounter for wellness examination 12/31/2015  . Primary localized osteoarthritis of right knee 12/12/2015  . Spinal stenosis in cervical region 10/18/2015  . Spinal stenosis of lumbar region 10/18/2015  . Memory impairment 08/30/2015  . Vitamin D deficiency 08/30/2015  . Status post left hip replacement 07/14/2013  . Constipation 07/14/2013  . Allergic  rhinitis 07/14/2013  . Osteopenia 06/27/2011  . Lacrimal canalicular stenosis 62/94/7654  . Degenerative joint disease involving multiple joints 04/06/2010    Past Medical history, Surgical history, Family history, Social history, Allergies and Medications have been entered into the medical record, reviewed and changed as needed.    Current Meds  Medication Sig  . acetaminophen (TYLENOL) 325 MG tablet Take 2 tablets (650 mg total) by mouth every 6 (six) hours as needed for mild pain (or Fever >/= 101).  Marland Kitchen amLODipine (NORVASC) 5 MG tablet Take 1 tablet (5 mg total) by mouth daily before breakfast.  . aspirin EC 81 MG tablet Take 81 mg by mouth daily.  . Calcium Carb-Cholecalciferol (CALCIUM 600-D PO) Take 1 tablet by mouth daily.  . calcium carbonate (TUMS EX) 750 MG chewable tablet Chew 2 tablets by mouth daily as needed for heartburn.  . Cholecalciferol (  VITAMIN D-3) 5000 units TABS Take 5,000 Units by mouth daily.  . DULoxetine (CYMBALTA) 60 MG capsule TAKE 2 CAPSULES BY MOUTH DAILY BEFORE BREAKFAST  . fluticasone (CUTIVATE) 0.05 % cream Apply 1 application topically 2 (two) times daily as needed (irritation).   . fluticasone (FLONASE) 50 MCG/ACT nasal spray Place 2 sprays into the nose daily.  Marland Kitchen gabapentin (NEURONTIN) 300 MG capsule TAKE 3 CAPSULES BY MOUTH 3 TIMES DAILY  . ketoconazole (NIZORAL) 2 % cream Apply 1 application topically 2 (two) times daily as needed for irritation.  Marland Kitchen losartan-hydrochlorothiazide (HYZAAR) 100-25 MG tablet Take 1 tablet by mouth daily before breakfast.  . mineral oil-hydrophilic petrolatum (AQUAPHOR) ointment Apply 1 application topically as needed for dry skin.  . montelukast (SINGULAIR) 10 MG tablet Take 10 mg by mouth daily before breakfast.   . niacin (NIASPAN) 500 MG CR tablet Take 1 tablet by mouth at bedtime.   Marland Kitchen omega-3 acid ethyl esters (LOVAZA) 1 g capsule Take 2 capsules nightly before bed  . simvastatin (ZOCOR) 40 MG tablet Take 1 tablet (40  mg total) by mouth at bedtime.  . traZODone (DESYREL) 100 MG tablet TAKE 1 TABLET BY MOUTH EACH NIGHT AT BEDTIME    Allergies:  Allergies  Allergen Reactions  . Avelox [Moxifloxacin Hcl In Nacl] Other (See Comments)    CHEST PAIN OF UNSPECIFIED ORIGIN  . Cephalosporins Hives and Itching  . Other Itching and Swelling    UNSPECIFIED INSECT STINGS     Review of Systems: General:   Denies fever, chills, unexplained weight loss.  Optho/Auditory:   Denies visual changes, blurred vision/LOV Respiratory:   Denies wheeze, DOE more than baseline levels.  Cardiovascular:   Denies chest pain, palpitations, new onset peripheral edema  Gastrointestinal:   Denies nausea, vomiting, diarrhea, abd pain.  Genitourinary: Denies dysuria, freq/ urgency, flank pain or discharge from genitals.  Endocrine:     Denies hot or cold intolerance, polyuria, polydipsia. Musculoskeletal:   Denies unexplained myalgias, joint swelling, unexplained arthralgias, gait problems.  Skin:  Denies new onset rash, suspicious lesions Neurological:     Denies dizziness, unexplained weakness, numbness  Psychiatric/Behavioral:   Denies mood changes, suicidal or homicidal ideations, hallucinations    Objective:   Blood pressure 130/83, pulse 80, height 5' 5.25" (1.657 m), weight 214 lb 14.4 oz (97.5 kg). Body mass index is 35.49 kg/m. General:  Well Developed, well nourished, appropriate for stated age.  Neuro:  Alert and oriented,  extra-ocular muscles intact  HEENT:  Normocephalic, atraumatic, neck supple, no carotid bruits appreciated  Skin:  no gross rash, warm, pink. Cardiac:  RRR, S1 S2 Respiratory:  ECTA B/L and A/P, Not using accessory muscles, speaking in full sentences- unlabored. Vascular:  Ext warm, no cyanosis apprec.; cap RF less 2 sec. Psych:  No HI/SI, judgement and insight good, Euthymic mood. Full Affect.

## 2016-10-04 NOTE — Assessment & Plan Note (Signed)
A1c a couple weeks ago was 5.9.  We will hold off on labs\recheck of that for at least 4-6 months.  Discussed with patient diet and lifestyle changes to help prevent onset diabetes

## 2016-10-04 NOTE — Patient Instructions (Addendum)
- Follow-up for Medicare wellness exam which we would update your immunizations, health maintenance etc. and do not deal with chronic conditions or problems so please make a separate appointment for this as well  So make your chronic f/up in 4 mon from now- A1c, mood, BP, pre-diabtes, wt mgt etc.     Risk factors for prediabetes and type 2 diabetes  Researchers don't fully understand why some people develop prediabetes and type 2 diabetes and others don't.  It's clear that certain factors increase the risk, however, including:  Weight. The more fatty tissue you have, the more resistant your cells become to insulin.  Inactivity. The less active you are, the greater your risk. Physical activity helps you control your weight, uses up glucose as energy and makes your cells more sensitive to insulin.  Family history. Your risk increases if a parent or sibling has type 2 diabetes.  Race. Although it's unclear why, people of certain races - including blacks, Hispanics, American Indians and Asian-Americans - are at higher risk.  Age. Your risk increases as you get older. This may be because you tend to exercise less, lose muscle mass and gain weight as you age. But type 2 diabetes is also increasing dramatically among children, adolescents and younger adults.  Gestational diabetes. If you developed gestational diabetes when you were pregnant, your risk of developing prediabetes and type 2 diabetes later increases. If you gave birth to a baby weighing more than 9 pounds (4 kilograms), you're also at risk of type 2 diabetes.  Polycystic ovary syndrome. For women, having polycystic ovary syndrome - a common condition characterized by irregular menstrual periods, excess hair growth and obesity - increases the risk of diabetes.  High blood pressure. Having blood pressure over 140/90 millimeters of mercury (mm Hg) is linked to an increased risk of type 2 diabetes.  Abnormal cholesterol and triglyceride levels.  If you have low levels of high-density lipoprotein (HDL), or "good," cholesterol, your risk of type 2 diabetes is higher. Triglycerides are another type of fat carried in the blood. People with high levels of triglycerides have an increased risk of type 2 diabetes. Your doctor can let you know what your cholesterol and triglyceride levels are.  A good guide to good carbs: The glycemic index ---If you have diabetes, or at risk for diabetes, you know all too well that when you eat carbohydrates, your blood sugar goes up. The total amount of carbs you consume at a meal or in a snack mostly determines what your blood sugar will do. But the food itself also plays a role. A serving of white rice has almost the same effect as eating pure table sugar - a quick, high spike in blood sugar. A serving of lentils has a slower, smaller effect.  ---Picking good sources of carbs can help you control your blood sugar and your weight. Even if you don't have diabetes, eating healthier carbohydrate-rich foods can help ward off a host of chronic conditions, from heart disease to various cancers to, well, diabetes.  ---One way to choose foods is with the glycemic index (GI). This tool measures how much a food boosts blood sugar.  The glycemic index rates the effect of a specific amount of a food on blood sugar compared with the same amount of pure glucose. A food with a glycemic index of 28 boosts blood sugar only 28% as much as pure glucose. One with a GI of 95 acts like pure glucose.    High  glycemic foods result in a quick spike in insulin and blood sugar (also known as blood glucose).  Low glycemic foods have a slower, smaller effect- these are healthier for you.   Using the glycemic index Using the glycemic index is easy: choose foods in the low GI category instead of those in the high GI category (see below), and go easy on those in between. Low glycemic index (GI of 55 or less): Most fruits and vegetables, beans,  minimally processed grains, pasta, low-fat dairy foods, and nuts.  Moderate glycemic index (GI 56 to 69): White and sweet potatoes, corn, white rice, couscous, breakfast cereals such as Cream of Wheat and Mini Wheats.  High glycemic index (GI of 70 or higher): White bread, rice cakes, most crackers, bagels, cakes, doughnuts, croissants, most packaged breakfast cereals. You can see the values for 100 commons foods and get links to more at www.health.CheapToothpicks.si.  Swaps for lowering glycemic index  Instead of this high-glycemic index food Eat this lower-glycemic index food  White rice Brown rice or converted rice  Instant oatmeal Steel-cut oats  Cornflakes Bran flakes  Baked potato Pasta, bulgur  White bread Whole-grain bread  Corn Peas or leafy greens       Prediabetes Eating Plan  Prediabetes--also called impaired glucose tolerance or impaired fasting glucose--is a condition that causes blood sugar (blood glucose) levels to be higher than normal. Following a healthy diet can help to keep prediabetes under control. It can also help to lower the risk of type 2 diabetes and heart disease, which are increased in people who have prediabetes. Along with regular exercise, a healthy diet:  Promotes weight loss.  Helps to control blood sugar levels.  Helps to improve the way that the body uses insulin.   WHAT DO I NEED TO KNOW ABOUT THIS EATING PLAN?   Use the glycemic index (GI) to plan your meals. The index tells you how quickly a food will raise your blood sugar. Choose low-GI foods. These foods take a longer time to raise blood sugar.  Pay close attention to the amount of carbohydrates in the food that you eat. Carbohydrates increase blood sugar levels.  Keep track of how many calories you take in. Eating the right amount of calories will help you to achieve a healthy weight. Losing about 7 percent of your starting weight can help to prevent type 2 diabetes.  You may want to  follow a Mediterranean diet. This diet includes a lot of vegetables, lean meats or fish, whole grains, fruits, and healthy oils and fats.   WHAT FOODS CAN I EAT?  Grains Whole grains, such as whole-wheat or whole-grain breads, crackers, cereals, and pasta. Unsweetened oatmeal. Bulgur. Barley. Quinoa. Brown rice. Corn or whole-wheat flour tortillas or taco shells. Vegetables Lettuce. Spinach. Peas. Beets. Cauliflower. Cabbage. Broccoli. Carrots. Tomatoes. Squash. Eggplant. Herbs. Peppers. Onions. Cucumbers. Brussels sprouts. Fruits Berries. Bananas. Apples. Oranges. Grapes. Papaya. Mango. Pomegranate. Kiwi. Grapefruit. Cherries. Meats and Other Protein Sources Seafood. Lean meats, such as chicken and [place] or lean cuts of pork and beef. Tofu. Eggs. Nuts. Beans. Dairy Low-fat or fat-free dairy products, such as yogurt, cottage cheese, and cheese. Beverages Water. Tea. Coffee. Sugar-free or diet soda. Seltzer water. Milk. Milk alternatives, such as soy or almond milk. Condiments Mustard. Relish. Low-fat, low-sugar ketchup. Low-fat, low-sugar barbecue sauce. Low-fat or fat-free mayonnaise. Sweets and Desserts Sugar-free or low-fat pudding. Sugar-free or low-fat ice cream and other frozen treats. Fats and Oils Avocado. Walnuts. Olive oil. The items  listed above may not be a complete list of recommended foods or beverages. Contact your dietitian for more options.    WHAT FOODS ARE NOT RECOMMENDED?  Grains Refined white flour and flour products, such as bread, pasta, snack foods, and cereals. Beverages Sweetened drinks, such as sweet iced tea and soda. Sweets and Desserts Baked goods, such as cake, cupcakes, pastries, cookies, and cheesecake. The items listed above may not be a complete list of foods and beverages to avoid. Contact your dietitian for more information.   This information is not intended to replace advice given to you by your health care provider. Make sure you discuss  any questions you have with your health care provider.   Document Released: 06/21/2014 Document Reviewed: 06/21/2014 Elsevier Interactive Patient Education [employer].

## 2016-10-05 LAB — LIPID PANEL
Chol/HDL Ratio: 4.3 ratio (ref 0.0–4.4)
Cholesterol, Total: 185 mg/dL (ref 100–199)
HDL: 43 mg/dL (ref >39)
LDL Calculated: 111 mg/dL — ABNORMAL HIGH (ref 0–99)
Triglycerides: 153 mg/dL — ABNORMAL HIGH (ref 0–149)
VLDL Cholesterol Cal: 31 mg/dL (ref 5–40)

## 2016-10-05 LAB — CBC WITH DIFFERENTIAL/PLATELET
Basophils Absolute: 0 10*3/uL (ref 0.0–0.2)
Basos: 0 %
EOS (ABSOLUTE): 0.3 10*3/uL (ref 0.0–0.4)
Eos: 3 %
Hematocrit: 42.4 % (ref 34.0–46.6)
Hemoglobin: 13.7 g/dL (ref 11.1–15.9)
Immature Grans (Abs): 0 10*3/uL (ref 0.0–0.1)
Immature Granulocytes: 0 %
Lymphocytes Absolute: 2.1 10*3/uL (ref 0.7–3.1)
Lymphs: 21 %
MCH: 28.1 pg (ref 26.6–33.0)
MCHC: 32.3 g/dL (ref 31.5–35.7)
MCV: 87 fL (ref 79–97)
Monocytes Absolute: 0.6 10*3/uL (ref 0.1–0.9)
Monocytes: 6 %
Neutrophils Absolute: 7.1 10*3/uL — ABNORMAL HIGH (ref 1.4–7.0)
Neutrophils: 70 %
Platelets: 396 10*3/uL — ABNORMAL HIGH (ref 150–379)
RBC: 4.87 x10E6/uL (ref 3.77–5.28)
RDW: 14.6 % (ref 12.3–15.4)
WBC: 10.2 10*3/uL (ref 3.4–10.8)

## 2016-10-05 LAB — COMPREHENSIVE METABOLIC PANEL
ALT: 17 IU/L (ref 0–32)
AST: 20 IU/L (ref 0–40)
Albumin/Globulin Ratio: 1.4 (ref 1.2–2.2)
Albumin: 3.9 g/dL (ref 3.5–4.8)
Alkaline Phosphatase: 85 IU/L (ref 39–117)
BUN/Creatinine Ratio: 24 (ref 12–28)
BUN: 20 mg/dL (ref 8–27)
Bilirubin Total: 0.2 mg/dL (ref 0.0–1.2)
CO2: 25 mmol/L (ref 20–29)
Calcium: 9.6 mg/dL (ref 8.7–10.3)
Chloride: 97 mmol/L (ref 96–106)
Creatinine, Ser: 0.85 mg/dL (ref 0.57–1.00)
GFR calc Af Amer: 78 mL/min/{1.73_m2} (ref >59)
GFR calc non Af Amer: 68 mL/min/{1.73_m2} (ref >59)
Globulin, Total: 2.8 g/dL (ref 1.5–4.5)
Glucose: 105 mg/dL — ABNORMAL HIGH (ref 65–99)
Potassium: 4.3 mmol/L (ref 3.5–5.2)
Sodium: 137 mmol/L (ref 134–144)
Total Protein: 6.7 g/dL (ref 6.0–8.5)

## 2016-10-05 LAB — VITAMIN D 25 HYDROXY (VIT D DEFICIENCY, FRACTURES): Vit D, 25-Hydroxy: 34.2 ng/mL (ref 30.0–100.0)

## 2016-10-05 LAB — HIV ANTIBODY (ROUTINE TESTING W REFLEX): HIV Screen 4th Generation wRfx: NONREACTIVE

## 2016-10-05 LAB — HEPATITIS C ANTIBODY: Hep C Virus Ab: <0.1 s/co ratio (ref 0.0–0.9)

## 2016-10-07 DIAGNOSIS — Z96651 Presence of right artificial knee joint: Secondary | ICD-10-CM | POA: Diagnosis not present

## 2016-10-08 ENCOUNTER — Other Ambulatory Visit: Payer: Self-pay | Admitting: Family Medicine

## 2016-10-08 ENCOUNTER — Telehealth: Payer: Self-pay | Admitting: Family Medicine

## 2016-10-08 NOTE — Telephone Encounter (Signed)
Patient called back after missing phone call from our office to discuss lab work, she said call back at earliest convenience

## 2016-10-08 NOTE — Telephone Encounter (Signed)
Called the patient she states that she does take the Oxybutynin 1 tablet daily at bedtime as needed.  Patient states that she must have gotten the medication confused with another on during her office visit.  She also states that she has about 15 pills left.  MPulliam, CMA/RT(R)

## 2016-10-08 NOTE — Telephone Encounter (Signed)
This medication appears to have been marked as pt not taking at last OV on 10/04/16.  However OV note is not completed so I am unsure whether to refill this medication.  Please review and authorize refill if appropriate.  Charyl Bigger, CMA

## 2016-10-08 NOTE — Telephone Encounter (Signed)
Called patient left message to call the office back. MPulliam, CMA/RT(R)  

## 2016-10-08 NOTE — Telephone Encounter (Signed)
If the patient told us last office visit on 8\17\18 that she was not taking the medicine, please do not refill it.  If you would like, please call and confirm this with the patient- that she is not taking it.

## 2016-10-24 DIAGNOSIS — M25551 Pain in right hip: Secondary | ICD-10-CM | POA: Diagnosis not present

## 2016-10-24 DIAGNOSIS — M5136 Other intervertebral disc degeneration, lumbar region: Secondary | ICD-10-CM | POA: Diagnosis not present

## 2016-10-28 ENCOUNTER — Other Ambulatory Visit: Payer: Self-pay

## 2016-10-28 DIAGNOSIS — M5136 Other intervertebral disc degeneration, lumbar region: Secondary | ICD-10-CM | POA: Diagnosis not present

## 2016-10-28 DIAGNOSIS — M545 Low back pain: Secondary | ICD-10-CM | POA: Diagnosis not present

## 2016-10-28 DIAGNOSIS — I10 Essential (primary) hypertension: Secondary | ICD-10-CM

## 2016-10-28 MED ORDER — TRAZODONE HCL 100 MG PO TABS: ORAL_TABLET | ORAL | 0 refills | Status: DC

## 2016-10-28 NOTE — Telephone Encounter (Signed)
Received Epic message alerting that pt had not read MyChart message regarding lab results.  Discussed results with patient verbally.  Pt expressed understanding and is agreeable  Pt requested refill of trazodone.  RX sent to pharmacy.  Charyl Bigger, CMA

## 2016-10-29 DIAGNOSIS — R350 Frequency of micturition: Secondary | ICD-10-CM | POA: Diagnosis not present

## 2016-10-29 DIAGNOSIS — R351 Nocturia: Secondary | ICD-10-CM | POA: Diagnosis not present

## 2016-10-29 DIAGNOSIS — N3946 Mixed incontinence: Secondary | ICD-10-CM | POA: Diagnosis not present

## 2016-11-01 DIAGNOSIS — M5136 Other intervertebral disc degeneration, lumbar region: Secondary | ICD-10-CM | POA: Diagnosis not present

## 2016-11-01 DIAGNOSIS — M545 Low back pain: Secondary | ICD-10-CM | POA: Diagnosis not present

## 2016-11-05 DIAGNOSIS — M545 Low back pain: Secondary | ICD-10-CM | POA: Diagnosis not present

## 2016-11-05 DIAGNOSIS — M5136 Other intervertebral disc degeneration, lumbar region: Secondary | ICD-10-CM | POA: Diagnosis not present

## 2016-11-07 DIAGNOSIS — M5136 Other intervertebral disc degeneration, lumbar region: Secondary | ICD-10-CM | POA: Diagnosis not present

## 2016-11-07 DIAGNOSIS — M545 Low back pain: Secondary | ICD-10-CM | POA: Diagnosis not present

## 2016-11-12 ENCOUNTER — Ambulatory Visit (INDEPENDENT_AMBULATORY_CARE_PROVIDER_SITE_OTHER): Payer: Commercial Managed Care - HMO | Admitting: Orthopedic Surgery

## 2016-11-12 DIAGNOSIS — M545 Low back pain: Secondary | ICD-10-CM | POA: Diagnosis not present

## 2016-11-12 DIAGNOSIS — M5136 Other intervertebral disc degeneration, lumbar region: Secondary | ICD-10-CM | POA: Diagnosis not present

## 2016-11-19 DIAGNOSIS — R350 Frequency of micturition: Secondary | ICD-10-CM | POA: Diagnosis not present

## 2016-11-19 DIAGNOSIS — R351 Nocturia: Secondary | ICD-10-CM | POA: Diagnosis not present

## 2016-11-19 DIAGNOSIS — M5136 Other intervertebral disc degeneration, lumbar region: Secondary | ICD-10-CM | POA: Diagnosis not present

## 2016-11-19 DIAGNOSIS — M545 Low back pain: Secondary | ICD-10-CM | POA: Diagnosis not present

## 2016-11-19 DIAGNOSIS — N3946 Mixed incontinence: Secondary | ICD-10-CM | POA: Diagnosis not present

## 2016-11-21 DIAGNOSIS — M5136 Other intervertebral disc degeneration, lumbar region: Secondary | ICD-10-CM | POA: Diagnosis not present

## 2016-11-21 DIAGNOSIS — M25572 Pain in left ankle and joints of left foot: Secondary | ICD-10-CM | POA: Diagnosis not present

## 2016-11-21 DIAGNOSIS — M25571 Pain in right ankle and joints of right foot: Secondary | ICD-10-CM | POA: Diagnosis not present

## 2016-11-22 ENCOUNTER — Ambulatory Visit (INDEPENDENT_AMBULATORY_CARE_PROVIDER_SITE_OTHER): Payer: PPO

## 2016-11-22 ENCOUNTER — Other Ambulatory Visit: Payer: Self-pay

## 2016-11-22 VITALS — BP 141/72 | HR 85 | Temp 98.5°F

## 2016-11-22 DIAGNOSIS — Z23 Encounter for immunization: Secondary | ICD-10-CM

## 2016-11-22 NOTE — Progress Notes (Signed)
Pt here for influenza vaccine.  Screening questionnaire reviewed, VIS provided to patient, and any/all patient questions answered.  T. Nelson, CMA  

## 2016-12-09 DIAGNOSIS — N3946 Mixed incontinence: Secondary | ICD-10-CM | POA: Diagnosis not present

## 2016-12-09 DIAGNOSIS — R350 Frequency of micturition: Secondary | ICD-10-CM | POA: Diagnosis not present

## 2016-12-10 ENCOUNTER — Ambulatory Visit (INDEPENDENT_AMBULATORY_CARE_PROVIDER_SITE_OTHER): Payer: PPO | Admitting: Family Medicine

## 2016-12-10 ENCOUNTER — Encounter: Payer: Self-pay | Admitting: Family Medicine

## 2016-12-10 VITALS — BP 123/80 | HR 76 | Ht 65.25 in | Wt 224.9 lb

## 2016-12-10 DIAGNOSIS — K1120 Sialoadenitis, unspecified: Secondary | ICD-10-CM | POA: Insufficient documentation

## 2016-12-10 MED ORDER — AMOXICILLIN-POT CLAVULANATE 875-125 MG PO TABS: 1.0000 | ORAL_TABLET | Freq: Two times a day (BID) | ORAL | 0 refills | Status: DC

## 2016-12-10 NOTE — Patient Instructions (Signed)
Increase water intake, suck on sour patch kids or lemon drops to increase saliva production, E no if after taking antibiotics, if you continued to get worse I need to know.  Sometimes we do need to send you to your nose and throat for further management

## 2016-12-10 NOTE — Progress Notes (Signed)
Pt here for an acute care OV today   Impression and Recommendations:    1. Sialoadenitis- submandibular gland     1. Sialoadenitis- submandibular gland Hand outs from internet given to pt about condition, treatment, prognosis etc. Increase water intake, suck on sour patch kids or lemon drops to increase saliva production, E no if after taking antibiotics, if you continued to get worse I need to know.  Sometimes we do need to send you to your nose and throat for further management  - amoxicillin-clavulanate (AUGMENTIN) 875-125 MG tablet; Take 1 tablet by mouth 2 (two) times daily.  Dispense: 20 tablet; Refill: 0   No problem-specific Assessment & Plan notes found for this encounter.   The patient was counseled, risk factors were discussed, anticipatory guidance given.  New Prescriptions   AMOXICILLIN-CLAVULANATE (AUGMENTIN) 875-125 MG TABLET    Take 1 tablet by mouth 2 (two) times daily.    Discontinued Medications   FLUTICASONE (FLONASE) 50 MCG/ACT NASAL SPRAY    Place 2 sprays into the nose daily.   MINERAL OIL-HYDROPHILIC PETROLATUM (AQUAPHOR) OINTMENT    Apply 1 application topically as needed for dry skin.    Modified Medications   No medications on file     Meds ordered this encounter  Medications  . amoxicillin-clavulanate (AUGMENTIN) 875-125 MG tablet    Sig: Take 1 tablet by mouth 2 (two) times daily.    Dispense:  20 tablet    Refill:  0    No orders of the defined types were placed in this encounter.    Gross side effects, risk and benefits, and alternatives of medications and treatment plan in general discussed with patient.  Patient is aware that all medications have potential side effects and we are unable to predict every side effect or drug-drug interaction that may occur.   Patient will call with any questions prior to using medication if they have concerns.  Expresses verbal understanding and consents to current therapy and treatment regimen.  No  barriers to understanding were identified.  Red flag symptoms and signs discussed in detail.  Patient expressed understanding regarding what to do in case of emergency\urgent symptoms  Please see AVS handed out to patient at the end of our visit for further patient instructions/ counseling done pertaining to today's office visit.   Return if symptoms worsen or fail to improve.     Note: This document was prepared using Dragon voice recognition software and may include unintentional dictation errors.  Mellody Dance 5:19 PM --------------------------------------------------------------------------------------------------------------------------------------------------------------------------------------------------------------------------------------------    Subjective:    CC:  Chief Complaint  Patient presents with  . Cyst    left side of neck     HPI: Shelly Hickman is a 76 y.o. female who presents to [facility] today for issues as discussed below.   Problem  Sialoadenitis- submandibular gland   Yesterday patient was on the phone and leaned onto her left elbow and where the phone hit her face, she felt the pain under her left ear.  She noticed a swelling and painful lump there.  She presented to the clinic today for acute care office visit.  She denies any ear pain, difficulty swallowing.  She denies any headache, fever chills, or one-sided face pain.  She denies any discharge of foul tasting material in her mouth.  Of note she did go to the dentist approximately one month to 1-1/2 months ago.      Wt Readings from  Last 3 Encounters:  12/10/16 224 lb 14.4 oz (102 kg)  10/04/16 214 lb 14.4 oz (97.5 kg)  09/03/16 217 lb (98.4 kg)   BP Readings from Last 3 Encounters:  12/10/16 123/80  11/22/16 (!) 141/72  10/04/16 130/83   BMI Readings from Last 3 Encounters:  12/10/16 37.14 kg/m  10/04/16 35.49 kg/m  09/03/16 35.83 kg/m      Patient Care Team    Relationship Specialty Notifications Start End  Mellody Dance, DO PCP - General Family Medicine  08/30/15   Almedia Balls, MD Consulting Physician Orthopedic Surgery  10/18/15   Kristeen Miss, MD Consulting Physician Neurosurgery  10/18/15    Comment: spine sx  Allyn Kenner, MD Consulting Physician Dermatology  10/18/15    Comment: skin screening   Elsie Saas, MD Consulting Physician Orthopedic Surgery  02/20/16   [facility] [place], MD Attending Physician   10/17/16      Patient Active Problem List   Diagnosis Date Noted  . Morbid obesity ([place]) 09/09/2015    Priority: High  . Prediabetes 09/06/2015    Priority: High  . Hypertriglyceridemia 09/06/2015    Priority: High  . HLD (hyperlipidemia) 08/30/2015    Priority: High  . Essential hypertension, benign 07/14/2013    Priority: High  . Anxiety 07/14/2013    Priority: High  . Insomnia 07/14/2013    Priority: High  . Depression 03/26/2011    Priority: High  . Low HDL (under 40) 09/06/2015    Priority: Medium  . Incontinence of urine 09/05/2013    Priority: Medium  . GERD (gastroesophageal reflux disease) 12/10/2012    Priority: Medium  . Sialoadenitis- submandibular gland 12/10/2016  . Pre-op evaluation 05/30/2016  . 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 Encounter for wellness examination 12/31/2015  . Primary localized osteoarthritis of right knee 12/12/2015  . Spinal stenosis in cervical region 10/18/2015  . Spinal stenosis of lumbar region 10/18/2015  . Memory impairment 08/30/2015  . Vitamin D deficiency 08/30/2015  . Status post left hip replacement 07/14/2013  . Constipation 07/14/2013  . Allergic rhinitis 07/14/2013  . Osteopenia 06/27/2011  . 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 Degenerative joint disease involving multiple joints 04/06/2010    Past Medical history, Surgical history, Family history, Social history, Allergies and Medications have been  entered into the medical record, reviewed and changed as needed.    No outpatient prescriptions have been marked as taking for the 12/10/16 encounter (Office Visit) with Mellody Dance, DO.    Allergies:  Allergies  Allergen Reactions  . Avelox [Moxifloxacin Hcl In Nacl] Other (See Comments)    CHEST PAIN OF UNSPECIFIED ORIGIN  . Cephalosporins Hives and Itching  . Other Itching and Swelling    UNSPECIFIED INSECT STINGS     Review of Systems: General:   Denies fever, chills, unexplained weight loss.  Optho/Auditory:   Denies visual changes, blurred vision/LOV Respiratory:   Denies wheeze, DOE more than baseline levels.  Cardiovascular:   Denies chest pain, palpitations, new onset peripheral edema  Gastrointestinal:   Denies nausea, vomiting, diarrhea, abd pain.  Genitourinary: Denies dysuria, freq/ urgency, flank pain or discharge from genitals.  Endocrine:     Denies hot or cold intolerance, polyuria, polydipsia. Musculoskeletal:   Denies unexplained myalgias, joint swelling, unexplained arthralgias, gait problems.  Skin:  Denies new onset rash, ++ suspicious lesion Neurological:     Denies dizziness, unexplained weakness, numbness  Psychiatric/Behavioral:   Denies mood changes, suicidal or homicidal ideations, hallucinations  Objective:   Blood pressure 123/80, pulse 76, height 5' 5.25" (1.657 m), weight 224 lb 14.4 oz (102 kg). Body mass index is 37.14 kg/m. General:  Well Developed, well nourished, appropriate for stated age.  Neuro:  Alert and oriented,  extra-ocular muscles intact  HEENT:  Normocephalic, atraumatic, neck supple- Approximately 3-4 centimeter tender nodule under submandibular angle on the left.  TM and external auditory canal within normal limits, no TMJ or popping or cracking of the joint.  No bony tenderness.  Skin:  no gross rash, warm, pink.,  Respiratory:   Not using accessory muscles, speaking in full sentences- unlabored. Vascular:  Ext warm,  no cyanosis apprec.; cap RF less 2 sec. Psych:  No HI/SI, judgement and insight good, Euthymic mood. Full Affect.

## 2016-12-11 DIAGNOSIS — Z1231 Encounter for screening mammogram for malignant neoplasm of breast: Secondary | ICD-10-CM | POA: Diagnosis not present

## 2016-12-16 ENCOUNTER — Other Ambulatory Visit: Payer: Self-pay | Admitting: Family Medicine

## 2016-12-27 ENCOUNTER — Other Ambulatory Visit: Payer: Self-pay | Admitting: Family Medicine

## 2016-12-27 NOTE — Telephone Encounter (Signed)
Reviewed chart had questions for the patient. Called left message for patient to call the office. MPulliam, CMA/RT(R)

## 2016-12-30 ENCOUNTER — Ambulatory Visit: Payer: PPO | Admitting: Family Medicine

## 2016-12-30 ENCOUNTER — Encounter: Payer: Self-pay | Admitting: Family Medicine

## 2016-12-30 VITALS — BP 111/77 | HR 86 | Ht 65.25 in | Wt 225.0 lb

## 2016-12-30 DIAGNOSIS — Z0000 Encounter for general adult medical examination without abnormal findings: Secondary | ICD-10-CM

## 2016-12-30 NOTE — Patient Instructions (Addendum)
Instead of increasing her Cymbalta I really want you to do these things below:  I want you to do [employer] and square breathing 4-5 times per day.  10 square breathing each time.    -Also we need to transition your brain into thinking more positively.  These tasks below are some things I want you to do every day 1)  write 3 new things that you are grateful for every day for 21 days  2)  exercise daily- walk for 15 minutes twice a day every day 3)  you are going to journal every day about one positive experience that you had 4)  meditate every day.  You can go on YouTube and look for 15-minute relaxation meditation or what ever.  But we need to make sure that you are in the moment and relaxing and deep breathing every day 5)  Write 1 positive email every day to praise someone in your life     - If you have insomnia or difficulty sleeping, this information is for you:  - Avoid caffeinated beverages after lunch,  no alcoholic beverages,  no eating within 2-3 hours of lying down,  avoid exposure to blue light before bed,  avoid daytime naps, and  needs to maintain a regular sleep schedule- go to sleep and wake up around the same time every night.   - Resolve concerns or worries before entering bedroom:  Discussed relaxation techniques with patient and to keep a journal to write down fears\ worries.  I suggested seeing a counselor for CBT.   - Recommend patient meditate or do deep breathing exercises to help relax.   Incorporate the use of white noise machines or listen to "sleep meditation music", or recordings of guided meditations for sleep from YouTube which are free, such as  "guided meditation for detachment from over thinking"  by Mayford Knife.          Preventive Care for Adults, Female  A healthy lifestyle and preventive care can promote health and wellness. Preventive health guidelines for women include the following key practices.   A routine yearly physical is a  good way to check with your health care provider about your health and preventive screening. It is a chance to share any concerns and updates on your health and to receive a thorough exam.   Visit your dentist for a routine exam and preventive care every 6 months. Brush your teeth twice a day and floss once a day. Good oral hygiene prevents tooth decay and gum disease.   The frequency of eye exams is based on your age, health, family medical history, use of contact lenses, and other factors. Follow your health care provider's recommendations for frequency of eye exams.   Eat a healthy diet. Foods like vegetables, fruits, whole grains, low-fat dairy products, and lean protein foods contain the nutrients you need without too many calories. Decrease your intake of foods high in solid fats, added sugars, and salt. Eat the right amount of calories for you.Get information about a proper diet from your health care provider, if necessary.   Regular physical exercise is one of the most important things you can do for your health. Most adults should get at least 150 minutes of moderate-intensity exercise (any activity that increases your heart rate and causes you to sweat) each week. In addition, most adults need muscle-strengthening exercises on 2 or more days a week.   Maintain a healthy weight. The body mass index (BMI) is  a screening tool to identify possible weight problems. It provides an estimate of body fat based on height and weight. Your health care provider can find your BMI, and can help you achieve or maintain a healthy weight.For adults 20 years and older:   - A BMI below 18.5 is considered underweight.   - A BMI of 18.5 to 24.9 is normal.   - A BMI of 25 to 29.9 is considered overweight.   - A BMI of 30 and above is considered obese.   Maintain normal blood lipids and cholesterol levels by exercising and minimizing your intake of trans and saturated fats.  Eat a balanced diet with plenty  of fruit and vegetables. Blood tests for lipids and cholesterol should begin at age 36 and be repeated every 5 years minimum.  If your lipid or cholesterol levels are high, you are over 40, or you are at high risk for heart disease, you may need your cholesterol levels checked more frequently.Ongoing high lipid and cholesterol levels should be treated with medicines if diet and exercise are not working.   If you smoke, find out from your health care provider how to quit. If you do not use tobacco, do not start.   Lung cancer screening is recommended for adults aged 39-80 years who are at high risk for developing lung cancer because of a history of smoking. A yearly low-dose CT scan of the lungs is recommended for people who have at least a 30-pack-year history of smoking and are a current smoker or have quit within the past 15 years. A pack year of smoking is smoking an average of 1 pack of cigarettes a day for 1 year (for example: 1 pack a day for 30 years or 2 packs a day for 15 years). Yearly screening should continue until the smoker has stopped smoking for at least 15 years. Yearly screening should be stopped for people who develop a health problem that would prevent them from having lung cancer treatment.   If you are pregnant, do not drink alcohol. If you are breastfeeding, be very cautious about drinking alcohol. If you are not pregnant and choose to drink alcohol, do not have more than 1 drink per day. One drink is considered to be 12 ounces (355 mL) of beer, 5 ounces (148 mL) of wine, or 1.5 ounces (44 mL) of liquor.   Avoid use of street drugs. Do not share needles with anyone. Ask for help if you need support or instructions about stopping the use of drugs.   High blood pressure causes heart disease and increases the risk of stroke. Your blood pressure should be checked at least yearly.  Ongoing high blood pressure should be treated with medicines if weight loss and exercise do not  work.   If you are 22-21 years old, ask your health care provider if you should take aspirin to prevent strokes.   Diabetes screening involves taking a blood sample to check your fasting blood sugar level. This should be done once every 3 years, after age 109, if you are within normal weight and without risk factors for diabetes. Testing should be considered at a younger age or be carried out more frequently if you are overweight and have at least 1 risk factor for diabetes.   Breast cancer screening is essential preventive care for women. You should practice "breast self-awareness."  This means understanding the normal appearance and feel of your breasts and may include breast self-examination.  Any changes  detected, no matter how small, should be reported to a health care provider.  Women in their 89s and 30s should have a clinical breast exam (CBE) by a health care provider as part of a regular health exam every 1 to 3 years.  After age 28, women should have a CBE every year.  Starting at age 12, women should consider having a mammogram (breast X-ray test) every year.  Women who have a family history of breast cancer should talk to their health care provider about genetic screening.  Women at a high risk of breast cancer should talk to their health care providers about having an MRI and a mammogram every year.   -Breast cancer gene (BRCA)-related cancer risk assessment is recommended for women who have family members with BRCA-related cancers. BRCA-related cancers include breast, ovarian, tubal, and peritoneal cancers. Having family members with these cancers may be associated with an increased risk for harmful changes (mutations) in the breast cancer genes BRCA1 and BRCA2. Results of the assessment will determine the need for genetic counseling and BRCA1 and BRCA2 testing.   The Pap test is a screening test for cervical cancer. A Pap test can show cell changes on the cervix that might become  cervical cancer if left untreated. A Pap test is a procedure in which cells are obtained and examined from the lower end of the uterus (cervix).   - Women should have a Pap test starting at age 63.   - Between ages 50 and 70, Pap tests should be repeated every 2 years.   - Beginning at age 69, you should have a Pap test every 3 years as long as the past 3 Pap tests have been normal.   - Some women have medical problems that increase the chance of getting cervical cancer. Talk to your health care provider about these problems. It is especially important to talk to your health care provider if a new problem develops soon after your last Pap test. In these cases, your health care provider may recommend more frequent screening and Pap tests.   - The above recommendations are the same for women who have or have not gotten the vaccine for human papillomavirus (HPV).   - If you had a hysterectomy for a problem that was not cancer or a condition that could lead to cancer, then you no longer need Pap tests. Even if you no longer need a Pap test, a regular exam is a good idea to make sure no other problems are starting.   - If you are between ages 35 and 16 years, and you have had normal Pap tests going back 10 years, you no longer need Pap tests. Even if you no longer need a Pap test, a regular exam is a good idea to make sure no other problems are starting.   - If you have had past treatment for cervical cancer or a condition that could lead to cancer, you need Pap tests and screening for cancer for at least 20 years after your treatment.   - If Pap tests have been discontinued, risk factors (such as a new sexual partner) need to be reassessed to determine if screening should be resumed.   - The HPV test is an additional test that may be used for cervical cancer screening. The HPV test looks for the virus that can cause the cell changes on the cervix. The cells collected during the Pap test can be tested  for HPV. The HPV test  could be used to screen women aged 54 years and older, and should be used in women of any age who have unclear Pap test results. After the age of 18, women should have HPV testing at the same frequency as a Pap test.   Colorectal cancer can be detected and often prevented. Most routine colorectal cancer screening begins at the age of 85 years and continues through age 65 years. However, your health care provider may recommend screening at an earlier age if you have risk factors for colon cancer. On a yearly basis, your health care provider may provide home test kits to check for hidden blood in the stool.  Use of a small camera at the end of a tube, to directly examine the colon (sigmoidoscopy or colonoscopy), can detect the earliest forms of colorectal cancer. Talk to your health care provider about this at age 41, when routine screening begins. Direct exam of the colon should be repeated every 5 -10 years through age 46 years, unless early forms of pre-cancerous polyps or small growths are found.   People who are at an increased risk for hepatitis B should be screened for this virus. You are considered at high risk for hepatitis B if:  -You were born in a country where hepatitis B occurs often. Talk with your health care provider about which countries are considered high risk.  - Your parents were born in a high-risk country and you have not received a shot to protect against hepatitis B (hepatitis B vaccine).  - You have HIV or AIDS.  - You use needles to inject street drugs.  - You live with, or have sex with, someone who has Hepatitis B.  - You get hemodialysis treatment.  - You take certain medicines for conditions like cancer, organ transplantation, and autoimmune conditions.   Hepatitis C blood testing is recommended for all people born from [birth year] through 1963 and any individual with known risks for hepatitis C.   Practice safe sex. Use condoms and avoid high-risk  sexual practices to reduce the spread of sexually transmitted infections (STIs). STIs include gonorrhea, chlamydia, syphilis, trichomonas, herpes, HPV, and human immunodeficiency virus (HIV). Herpes, HIV, and HPV are viral illnesses that have no cure. They can result in disability, cancer, and death. Sexually active women aged 26 years and younger should be checked for chlamydia. Older women with new or multiple partners should also be tested for chlamydia. Testing for other STIs is recommended if you are sexually active and at increased risk.   Osteoporosis is a disease in which the bones lose minerals and strength with aging. This can result in serious bone fractures or breaks. The risk of osteoporosis can be identified using a bone density scan. Women ages 55 years and over and women at risk for fractures or osteoporosis should discuss screening with their health care providers. Ask your health care provider whether you should take a calcium supplement or vitamin D to There are also several preventive steps women can take to avoid osteoporosis and resulting fractures or to keep osteoporosis from worsening. -->Recommendations include:  Eat a balanced diet high in fruits, vegetables, calcium, and vitamins.  Get enough calcium. The recommended total intake of is 1,200 mg daily; for best absorption, if taking supplements, divide doses into 250-500 mg doses throughout the day. Of the two types of calcium, calcium carbonate is best absorbed when taken with food but calcium citrate can be taken on an empty stomach.  Get enough vitamin D.  NAMS and the [place] recommend at least 1,000 IU per day for women age 64 and over who are at risk of vitamin D deficiency. Vitamin D deficiency can be caused by inadequate sun exposure (for example, those who live in [place]).  Avoid alcohol and smoking. Heavy alcohol intake (more than 7 drinks per week) increases the risk of falls and  hip fracture and women smokers tend to lose bone more rapidly and have lower bone mass than nonsmokers. Stopping smoking is one of the most important changes women can make to improve their health and decrease risk for disease.  Be physically active every day. Weight-bearing exercise (for example, fast walking, hiking, jogging, and weight training) may strengthen bones or slow the rate of bone loss that comes with aging. Balancing and muscle-strengthening exercises can reduce the risk of falling and fracture.  Consider therapeutic medications. Currently, several types of effective drugs are available. Healthcare providers can recommend the type most appropriate for each woman.  Eliminate environmental factors that may contribute to accidents. Falls cause nearly 90% of all osteoporotic fractures, so reducing this risk is an important bone-health strategy. Measures include ample lighting, removing obstructions to walking, using nonskid rugs on floors, and placing mats and/or grab bars in showers.  Be aware of medication side effects. Some common medicines make bones weaker. These include a type of steroid drug called glucocorticoids used for arthritis and asthma, some antiseizure drugs, certain sleeping pills, treatments for endometriosis, and some cancer drugs. An overactive thyroid gland or using too much thyroid hormone for an underactive thyroid can also be a problem. If you are taking these medicines, talk to your doctor about what you can do to help protect your bones.reduce the rate of osteoporosis.    Menopause can be associated with physical symptoms and risks. Hormone replacement therapy is available to decrease symptoms and risks. You should talk to your health care provider about whether hormone replacement therapy is right for you.   Use sunscreen. Apply sunscreen liberally and repeatedly throughout the day. You should seek shade when your shadow is shorter than you. Protect yourself by  wearing long sleeves, pants, a wide-brimmed hat, and sunglasses year round, whenever you are outdoors.   Once a month, do a whole body skin exam, using a mirror to look at the skin on your back. Tell your health care provider of new moles, moles that have irregular borders, moles that are larger than a pencil eraser, or moles that have changed in shape or color.   -Stay current with required vaccines (immunizations).   Influenza vaccine. All adults should be immunized every year.  Tetanus, diphtheria, and acellular pertussis (Td, Tdap) vaccine. Pregnant women should receive 1 dose of Tdap vaccine during each pregnancy. The dose should be obtained regardless of the length of time since the last dose. Immunization is preferred during the 27th 36th week of gestation. An adult who has not previously received Tdap or who does not know her vaccine status should receive 1 dose of Tdap. This initial dose should be followed by tetanus and diphtheria toxoids (Td) booster doses every 10 years. Adults with an unknown or incomplete history of completing a 3-dose immunization series with Td-containing vaccines should begin or complete a primary immunization series including a Tdap dose. Adults should receive a Td booster every 10 years.  Varicella vaccine. An adult without evidence of immunity to varicella should receive 2 doses or a second dose if she has previously received 1  dose. Pregnant females who do not have evidence of immunity should receive the first dose after pregnancy. This first dose should be obtained before leaving the health care facility. The second dose should be obtained 4 8 weeks after the first dose.  Human papillomavirus (HPV) vaccine. Females aged 4 26 years who have not received the vaccine previously should obtain the 3-dose series. The vaccine is not recommended for use in pregnant females. However, pregnancy testing is not needed before receiving a dose. If a female is found to be  pregnant after receiving a dose, no treatment is needed. In that case, the remaining doses should be delayed until after the pregnancy. Immunization is recommended for any person with an immunocompromised condition through the age of 43 years if she did not get any or all doses earlier. During the 3-dose series, the second dose should be obtained 4 8 weeks after the first dose. The third dose should be obtained 24 weeks after the first dose and 16 weeks after the second dose.  Zoster vaccine. One dose is recommended for adults aged 15 years or older unless certain conditions are present.  Measles, mumps, and rubella (MMR) vaccine. Adults born before 21 generally are considered immune to measles and mumps. Adults born in [birth year] or later should have 1 or more doses of MMR vaccine unless there is a contraindication to the vaccine or there is laboratory evidence of immunity to each of the three diseases. A routine second dose of MMR vaccine should be obtained at least 28 days after the first dose for students attending postsecondary schools, health care workers, or international travelers. People who received inactivated measles vaccine or an unknown type of measles vaccine during 1963 1967 should receive 2 doses of MMR vaccine. People who received inactivated mumps vaccine or an unknown type of mumps vaccine before 1979 and are at high risk for mumps infection should consider immunization with 2 doses of MMR vaccine. For females of childbearing age, rubella immunity should be determined. If there is no evidence of immunity, females who are not pregnant should be vaccinated. If there is no evidence of immunity, females who are pregnant should delay immunization until after pregnancy. Unvaccinated health care workers born before [birth year] who lack laboratory evidence of measles, mumps, or rubella immunity or laboratory confirmation of disease should consider measles and mumps immunization with 2 doses of MMR vaccine or  rubella immunization with 1 dose of MMR vaccine.  Pneumococcal 13-valent conjugate (PCV13) vaccine. When indicated, a person who is uncertain of her immunization history and has no record of immunization should receive the PCV13 vaccine. An adult aged 63 years or older who has certain medical conditions and has not been previously immunized should receive 1 dose of PCV13 vaccine. This PCV13 should be followed with a dose of pneumococcal polysaccharide (PPSV23) vaccine. The PPSV23 vaccine dose should be obtained at least 8 weeks after the dose of PCV13 vaccine. An adult aged 88 years or older who has certain medical conditions and previously received 1 or more doses of PPSV23 vaccine should receive 1 dose of PCV13. The PCV13 vaccine dose should be obtained 1 or more years after the last PPSV23 vaccine dose.  Pneumococcal polysaccharide (PPSV23) vaccine. When PCV13 is also indicated, PCV13 should be obtained first. All adults aged 76 years and older should be immunized. An adult younger than age 37 years who has certain medical conditions should be immunized. Any person who resides in a nursing home or long-term care facility  should be immunized. An adult smoker should be immunized. People with an immunocompromised condition and certain other conditions should receive both PCV13 and PPSV23 vaccines. People with human immunodeficiency virus (HIV) infection should be immunized as soon as possible after diagnosis. Immunization during chemotherapy or radiation therapy should be avoided. Routine use of PPSV23 vaccine is not recommended for American Indians, [place] Natives, or people younger than 65 years unless there are medical conditions that require PPSV23 vaccine. When indicated, people who have unknown immunization and have no record of immunization should receive PPSV23 vaccine. One-time revaccination 5 years after the first dose of PPSV23 is recommended for people aged 69 64 years who have chronic kidney  failure, nephrotic syndrome, asplenia, or immunocompromised conditions. People who received 1 2 doses of PPSV23 before age 49 years should receive another dose of PPSV23 vaccine at age 46 years or later if at least 5 years have passed since the previous dose. Doses of PPSV23 are not needed for people immunized with PPSV23 at or after age 68 years.  Meningococcal vaccine. Adults with asplenia or persistent complement component deficiencies should receive 2 doses of quadrivalent meningococcal conjugate (MenACWY-D) vaccine. The doses should be obtained at least 2 months apart. Microbiologists working with certain meningococcal bacteria, [employer] [place] recruits, people at risk during an outbreak, and people who travel to or live in countries with a high rate of meningitis should be immunized. A first-year college student up through age 40 years who is living in a residence hall should receive a dose if she did not receive a dose on or after her 16th birthday. Adults who have certain high-risk conditions should receive one or more doses of vaccine.  Hepatitis A vaccine. Adults who wish to be protected from this disease, have certain high-risk conditions, work with hepatitis A-infected animals, work in hepatitis A research labs, or travel to or work in countries with a high rate of hepatitis A should be immunized. Adults who were previously unvaccinated and who anticipate close contact with an international adoptee during the first 60 days after arrival in the [place] from a country with a high rate of hepatitis A should be immunized.  Hepatitis B vaccine.  Adults who wish to be protected from this disease, have certain high-risk conditions, may be exposed to blood or other infectious body fluids, are household contacts or sex partners of hepatitis B positive people, are clients or workers in certain care facilities, or travel to or work in countries with a high rate of hepatitis B should be  immunized.  Haemophilus influenzae type b (Hib) vaccine. A previously unvaccinated person with asplenia or sickle cell disease or having a scheduled splenectomy should receive 1 dose of Hib vaccine. Regardless of previous immunization, a recipient of a hematopoietic stem cell transplant should receive a 3-dose series 6 12 months after her successful transplant. Hib vaccine is not recommended for adults with HIV infection.  Preventive Services / Frequency Ages 52 to 39years  Blood pressure check.** / Every 1 to 2 years.  Lipid and cholesterol check.** / Every 5 years beginning at age 8.  Clinical breast exam.** / Every 3 years for women in their 70s and 58s.  BRCA-related cancer risk assessment.** / For women who have family members with a BRCA-related cancer (breast, ovarian, tubal, or peritoneal cancers).  Pap test.** / Every 2 years from ages 20 through 85. Every 3 years starting at age 47 through age 87 or 64 with a history of 3 consecutive normal Pap  tests.  HPV screening.** / Every 3 years from ages 54 through ages 36 to 17 with a history of 3 consecutive normal Pap tests.  Hepatitis C blood test.** / For any individual with known risks for hepatitis C.  Skin self-exam. / Monthly.  Influenza vaccine. / Every year.  Tetanus, diphtheria, and acellular pertussis (Tdap, Td) vaccine.** / Consult your health care provider. Pregnant women should receive 1 dose of Tdap vaccine during each pregnancy. 1 dose of Td every 10 years.  Varicella vaccine.** / Consult your health care provider. Pregnant females who do not have evidence of immunity should receive the first dose after pregnancy.  HPV vaccine. / 3 doses over 6 months, if 27 and younger. The vaccine is not recommended for use in pregnant females. However, pregnancy testing is not needed before receiving a dose.  Measles, mumps, rubella (MMR) vaccine.** / You need at least 1 dose of MMR if you were born in 1957 or later. You may also  need a 2nd dose. For females of childbearing age, rubella immunity should be determined. If there is no evidence of immunity, females who are not pregnant should be vaccinated. If there is no evidence of immunity, females who are pregnant should delay immunization until after pregnancy.  Pneumococcal 13-valent conjugate (PCV13) vaccine.** / Consult your health care provider.  Pneumococcal polysaccharide (PPSV23) vaccine.** / 1 to 2 doses if you smoke cigarettes or if you have certain conditions.  Meningococcal vaccine.** / 1 dose if you are age 9 to 52 years and a Market researcher living in a residence hall, or have one of several medical conditions, you need to get vaccinated against meningococcal disease. You may also need additional booster doses.  Hepatitis A vaccine.** / Consult your health care provider.  Hepatitis B vaccine.** / Consult your health care provider.  Haemophilus influenzae type b (Hib) vaccine.** / Consult your health care provider.  Ages 53 to 64years  Blood pressure check.** / Every 1 to 2 years.  Lipid and cholesterol check.** / Every 5 years beginning at age 42 years.  Lung cancer screening. / Every year if you are aged 42 80 years and have a 30-pack-year history of smoking and currently smoke or have quit within the past 15 years. Yearly screening is stopped once you have quit smoking for at least 15 years or develop a health problem that would prevent you from having lung cancer treatment.  Clinical breast exam.** / Every year after age 13 years.  BRCA-related cancer risk assessment.** / For women who have family members with a BRCA-related cancer (breast, ovarian, tubal, or peritoneal cancers).  Mammogram.** / Every year beginning at age 72 years and continuing for as long as you are in good health. Consult with your health care provider.  Pap test.** / Every 3 years starting at age 73 years through age 89 or 38 years with a history of 3 consecutive  normal Pap tests.  HPV screening.** / Every 3 years from ages 16 years through ages 35 to 75 years with a history of 3 consecutive normal Pap tests.  Fecal occult blood test (FOBT) of stool. / Every year beginning at age 77 years and continuing until age 4 years. You may not need to do this test if you get a colonoscopy every 10 years.  Flexible sigmoidoscopy or colonoscopy.** / Every 5 years for a flexible sigmoidoscopy or every 10 years for a colonoscopy beginning at age 52 years and continuing until age 76 years.  Hepatitis C blood test.** / For all people born from [birth year] through 1963 and any individual with known risks for hepatitis C.  Skin self-exam. / Monthly.  Influenza vaccine. / Every year.  Tetanus, diphtheria, and acellular pertussis (Tdap/Td) vaccine.** / Consult your health care provider. Pregnant women should receive 1 dose of Tdap vaccine during each pregnancy. 1 dose of Td every 10 years.  Varicella vaccine.** / Consult your health care provider. Pregnant females who do not have evidence of immunity should receive the first dose after pregnancy.  Zoster vaccine.** / 1 dose for adults aged 46 years or older.  Measles, mumps, rubella (MMR) vaccine.** / You need at least 1 dose of MMR if you were born in 1957 or later. You may also need a 2nd dose. For females of childbearing age, rubella immunity should be determined. If there is no evidence of immunity, females who are not pregnant should be vaccinated. If there is no evidence of immunity, females who are pregnant should delay immunization until after pregnancy.  Pneumococcal 13-valent conjugate (PCV13) vaccine.** / Consult your health care provider.  Pneumococcal polysaccharide (PPSV23) vaccine.** / 1 to 2 doses if you smoke cigarettes or if you have certain conditions.  Meningococcal vaccine.** / Consult your health care provider.  Hepatitis A vaccine.** / Consult your health care provider.  Hepatitis B vaccine.** /  Consult your health care provider.  Haemophilus influenzae type b (Hib) vaccine.** / Consult your health care provider.  Ages 45 years and over  Blood pressure check.** / Every 1 to 2 years.  Lipid and cholesterol check.** / Every 5 years beginning at age 26 years.  Lung cancer screening. / Every year if you are aged 70 80 years and have a 30-pack-year history of smoking and currently smoke or have quit within the past 15 years. Yearly screening is stopped once you have quit smoking for at least 15 years or develop a health problem that would prevent you from having lung cancer treatment.  Clinical breast exam.** / Every year after age 63 years.  BRCA-related cancer risk assessment.** / For women who have family members with a BRCA-related cancer (breast, ovarian, tubal, or peritoneal cancers).  Mammogram.** / Every year beginning at age 70 years and continuing for as long as you are in good health. Consult with your health care provider.  Pap test.** / Every 3 years starting at age 51 years through age 27 or 45 years with 3 consecutive normal Pap tests. Testing can be stopped between 65 and 70 years with 3 consecutive normal Pap tests and no abnormal Pap or HPV tests in the past 10 years.  HPV screening.** / Every 3 years from ages 60 years through ages 80 or 52 years with a history of 3 consecutive normal Pap tests. Testing can be stopped between 65 and 70 years with 3 consecutive normal Pap tests and no abnormal Pap or HPV tests in the past 10 years.  Fecal occult blood test (FOBT) of stool. / Every year beginning at age 73 years and continuing until age 83 years. You may not need to do this test if you get a colonoscopy every 10 years.  Flexible sigmoidoscopy or colonoscopy.** / Every 5 years for a flexible sigmoidoscopy or every 10 years for a colonoscopy beginning at age 35 years and continuing until age 65 years.  Hepatitis C blood test.** / For all people born from [birth year] through  1965 and any individual with known risks for hepatitis C.  Osteoporosis  screening.** / A one-time screening for women ages 47 years and over and women at risk for fractures or osteoporosis.  Skin self-exam. / Monthly.  Influenza vaccine. / Every year.  Tetanus, diphtheria, and acellular pertussis (Tdap/Td) vaccine.** / 1 dose of Td every 10 years.  Varicella vaccine.** / Consult your health care provider.  Zoster vaccine.** / 1 dose for adults aged 37 years or older.  Pneumococcal 13-valent conjugate (PCV13) vaccine.** / Consult your health care provider.  Pneumococcal polysaccharide (PPSV23) vaccine.** / 1 dose for all adults aged 53 years and older.  Meningococcal vaccine.** / Consult your health care provider.  Hepatitis A vaccine.** / Consult your health care provider.  Hepatitis B vaccine.** / Consult your health care provider.  Haemophilus influenzae type b (Hib) vaccine.** / Consult your health care provider. ** Family history and personal history of risk and conditions may change your health care provider's recommendations. Document Released: 04/02/2001 Document Revised: 11/25/2012  [facility] Patient Information 2014 [place], [place].   EXERCISE AND DIET:  We recommended that you start or continue a regular exercise program for good health. Regular exercise means any activity that makes your heart beat faster and makes you sweat.  We recommend exercising at least 30 minutes per day at least 3 days a week, preferably 5.  We also recommend a diet low in fat and sugar / carbohydrates.  Inactivity, poor dietary choices and obesity can cause diabetes, heart attack, stroke, and kidney damage, among others.     ALCOHOL AND SMOKING:  Women should limit their alcohol intake to no more than 7 drinks/beers/glasses of wine (combined, not each!) per week. Moderation of alcohol intake to this level decreases your risk of breast cancer and liver damage.  ( And of course, no recreational  drugs are part of a healthy lifestyle.)  Also, you should not be smoking at all or even being exposed to second hand smoke. Most people know smoking can cause cancer, and various heart and lung diseases, but did you know it also contributes to weakening of your bones?  Aging of your skin?  Yellowing of your teeth and nails?   CALCIUM AND VITAMIN D:  Adequate intake of calcium and Vitamin D are recommended.  The recommendations for exact amounts of these supplements seem to change often, but generally speaking 600 mg of calcium (either carbonate or citrate) and 800 units of Vitamin D per day seems prudent. Certain women may benefit from higher intake of Vitamin D.  If you are among these women, your doctor will have told you during your visit.     PAP SMEARS:  Pap smears, to check for cervical cancer or precancers,  have traditionally been done yearly, although recent scientific advances have shown that most women can have pap smears less often.  However, every woman still should have a physical exam from her gynecologist or primary care physician every year. It will include a breast check, inspection of the vulva and vagina to check for abnormal growths or skin changes, a visual exam of the cervix, and then an exam to evaluate the size and shape of the uterus and ovaries.  And after 74 years of age, a rectal exam is indicated to check for rectal cancers. We will also provide age appropriate advice regarding health maintenance, like when you should have certain vaccines, screening for sexually transmitted diseases, bone density testing, colonoscopy, mammograms, etc.    MAMMOGRAMS:  All women over 61 years old should have a yearly mammogram. Many  facilities now offer a "3D" mammogram, which may cost around $50 extra out of pocket. If possible,  we recommend you accept the option to have the 3D mammogram performed.  It both reduces the number of women who will be called back for extra views which then turn  out to be normal, and it is better than the routine mammogram at detecting truly abnormal areas.     COLONOSCOPY:  Colonoscopy to screen for colon cancer is recommended for all women at age 7.  We know, you hate the idea of the prep.  We agree, BUT, having colon cancer and not knowing it is worse!!  Colon cancer so often starts as a polyp that can be seen and removed at colonscopy, which can quite literally save your life!  And if your first colonoscopy is normal and you have no family history of colon cancer, most women don't have to have it again for 10 years.  Once every ten years, you can do something that may end up saving your life, right?  We will be happy to help you get it scheduled when you are ready.  Be sure to check your insurance coverage so you understand how much it will cost.  It may be covered as a preventative service at no cost, but you should check your particular policy.

## 2016-12-30 NOTE — Progress Notes (Signed)
Subjective:   Shelly Hickman is a 76 y.o. female who presents for Medicare Annual (Subsequent) preventive examination.  Functional Status Survey: Is the patient deaf or have difficulty hearing?: Yes Does the patient have difficulty seeing, even when wearing glasses/contacts?: No Does the patient have difficulty concentrating, remembering, or making decisions?: No Does the patient have difficulty walking or climbing stairs?: No Does the patient have difficulty dressing or bathing?: No Does the patient have difficulty doing errands alone such as visiting a doctor's office or shopping?: No   Activities of Daily Living In your present state of health, do you have difficulty performing the following activities?  1- Driving - no 2- Managing money - no 3- Feeding yourself - no 4- Getting from the bed to the chair - no 5- Climbing a flight of stairs - no 6- Preparing food and eating - no 7- Bathing or showering - no 8- Getting dressed - no 9- Getting to the toilet - no 10- Using the toilet - no 11- Moving around from place to place - no   Fall Risk  12/30/2016 12/10/2016 10/04/2016 11/17/2015  Falls in the past year? No No No No    Depression screen [facility] 2/9 12/30/2016 12/10/2016 10/04/2016  Decreased Interest 2 0 1  Down, Depressed, Hopeless 2 0 1  PHQ - 2 Score 4 0 2  Altered sleeping 2 - 1  Tired, decreased energy 3 - 1  Change in appetite 3 - 1  Feeling bad or failure about yourself  3 - 1  Trouble concentrating 0 - 0  Moving slowly or fidgety/restless 0 - 0  Suicidal thoughts 0 - 0  PHQ-9 Score 15 - 6  Difficult doing work/chores Somewhat difficult - Somewhat difficult   6CIT Screen 12/30/2016  What Year? 0 points  What month? 0 points  What time? 0 points  Count back from 20 0 points  Months in reverse 0 points  Repeat phrase 0 points  Total Score 0     Review of Systems:  Review of Systems: General:   Denies fever, chills, unexplained weight loss.   Optho/Auditory:   Denies visual changes, blurred vision/LOV Respiratory:   Denies SOB, DOE more than baseline levels.  Cardiovascular:   Denies chest pain, palpitations, new onset peripheral edema  Gastrointestinal:   Denies nausea, vomiting, diarrhea.  Genitourinary: Denies dysuria, freq/ urgency, flank pain or discharge from genitals.  Endocrine:     Denies hot or cold intolerance, polyuria, polydipsia. Musculoskeletal:   Denies unexplained myalgias, joint swelling, unexplained arthralgias, gait problems.  Skin:  Denies rash, suspicious lesions Neurological:     Denies dizziness, unexplained weakness, numbness  Psychiatric/Behavioral:   Denies mood changes, suicidal or homicidal ideations, hallucinations          Objective:     Vitals: BP 111/77   Pulse 86   Wt 225 lb (102.1 kg)   BMI 37.16 kg/m   Body mass index is 37.16 kg/m.   Tobacco Social History   Tobacco Use  Smoking Status Former Smoker  . Types: Cigarettes  . Last attempt to quit: 02/18/1965  . Years since quitting: 51.8  Smokeless Tobacco Never Used     Counseling given: Not Answered   Past Medical History:  Diagnosis Date  . Anemia   . Anxiety   . Arthritis   . Depression   . H/O hiatal hernia   . Hypercholesterolemia   . Hypertension   . Incontinence of urine   . Neuromuscular  disorder ([place])   . Osteoarthritis    Past Surgical History:  Procedure Laterality Date  . 2 Synovial Cysts removed between L4-L5    . ABDOMINAL HYSTERECTOMY    . BACK SURGERY     back fusion after 2 back surgeries   . CARPAL TUNNEL RELEASE    . COLONOSCOPY    . EYE SURGERY     tube inserted for excessive tearing , tube is removed   . Right Knee Arthroscopy    . SALPINGOOPHORECTOMY  1993  . TONSILLECTOMY    . WISDOM TOOTH EXTRACTION     Family History  Problem Relation Age of Onset  . Hypertension Mother   . Diabetes Mother   . Stroke Mother   . Alcoholism Father   . Alcohol abuse Father   . Depression  Sister    Social History   Substance and Sexual Activity  Sexual Activity Not Currently    Outpatient Encounter Medications as of 12/30/2016  Medication Sig  . acetaminophen (TYLENOL) 325 MG tablet Take 2 tablets (650 mg total) by mouth every 6 (six) hours as needed for mild pain (or Fever >/= 101).  Marland Kitchen amLODipine (NORVASC) 5 MG tablet Take 1 tablet (5 mg total) by mouth daily before breakfast.  . amoxicillin-clavulanate (AUGMENTIN) 875-125 MG tablet Take 1 tablet by mouth 2 (two) times daily.  Marland Kitchen aspirin EC 81 MG tablet Take 81 mg by mouth daily.  . Calcium Carb-Cholecalciferol (CALCIUM 600-D PO) Take 1 tablet by mouth daily.  . calcium carbonate (TUMS EX) 750 MG chewable tablet Chew 2 tablets by mouth daily as needed for heartburn.  . Cholecalciferol (VITAMIN D-3) 5000 units TABS Take 5,000 Units by mouth daily.  . DULoxetine (CYMBALTA) 60 MG capsule TAKE 2 CAPSULES BY MOUTH DAILY BEFORE BREAKFAST  . fluticasone (CUTIVATE) 0.05 % cream Apply 1 application topically 2 (two) times daily as needed (irritation).   . gabapentin (NEURONTIN) 300 MG capsule TAKE 3 CAPSULES BY MOUTH 3 TIMES DAILY  . gabapentin (NEURONTIN) 300 MG capsule TAKE 3 CAPSULES BY MOUTH 3 TIMES DAILY  . ketoconazole (NIZORAL) 2 % cream Apply 1 application topically 2 (two) times daily as needed for irritation.  Marland Kitchen losartan-hydrochlorothiazide (HYZAAR) 100-25 MG tablet Take 1 tablet by mouth daily before breakfast.  . montelukast (SINGULAIR) 10 MG tablet Take 10 mg by mouth daily before breakfast.   . niacin (NIASPAN) 500 MG CR tablet Take 1 tablet by mouth at bedtime.   Marland Kitchen omega-3 acid ethyl esters (LOVAZA) 1 g capsule Take 2 capsules nightly before bed  . oxybutynin (DITROPAN-XL) 10 MG 24 hr tablet Take 1 tablet (10 mg total) by mouth at bedtime.  . simvastatin (ZOCOR) 10 MG tablet TAKE ONE (1) TABLET BY MOUTH AT BEDTIME  . simvastatin (ZOCOR) 40 MG tablet Take 1 tablet (40 mg total) by mouth at bedtime.  . traZODone  (DESYREL) 100 MG tablet TAKE 1 TABLET BY MOUTH EACH NIGHT AT BEDTIME   No facility-administered encounter medications on file as of 12/30/2016.     Activities of Daily Living In your present state of health, do you have any difficulty performing the following activities: 12/30/2016 02/22/2016  Hearing? Tempie Donning  Vision? N N  Difficulty concentrating or making decisions? N N  Walking or climbing stairs? N N  Dressing or bathing? N N  Doing errands, shopping? N N  Some recent data might be hidden    Patient Care Team: Mellody Dance, DO as PCP - General (Family Medicine) Lynann Bologna,  Vicente Males, MD as Consulting Physician (Orthopedic Surgery) Kristeen Miss, MD as Consulting Physician (Neurosurgery) Allyn Kenner, MD as Consulting Physician (Dermatology) Elsie Saas, MD as Consulting Physician (Orthopedic Surgery) [facility], [place], MD as Attending Physician    Assessment:    *** Exercise Activities and Dietary recommendations    Goals    None     Fall Risk Fall Risk  12/30/2016 12/10/2016 10/04/2016 11/17/2015  Falls in the past year? No No No No   Depression Screen PHQ 2/9 Scores 12/30/2016 12/10/2016 10/04/2016 11/17/2015  PHQ - 2 Score 4 0 2 0  PHQ- 9 Score 15 - 6 -     Cognitive Function     6CIT Screen 12/30/2016  What Year? 0 points  What month? 0 points  What time? 0 points  Count back from 20 0 points  Months in reverse 0 points  Repeat phrase 0 points  Total Score 0    Immunization History  Administered Date(s) Administered  . IPV 01/20/2010  . Influenza Split 01/23/2010  . Influenza, High Dose Seasonal PF 11/17/2015, 11/22/2016  . Pneumococcal Conjugate-13 03/25/2008, 11/16/2013  . Pneumococcal Polysaccharide-23 03/25/2008, 11/16/2012  . Tdap 02/18/2010, 12/28/2015  . Zoster 02/19/2007, 11/16/2013   Screening Tests Health Maintenance  Topic Date Due  . MAMMOGRAM  12/07/2017  . COLONOSCOPY  01/22/2024  . TETANUS/TDAP  12/27/2025  . INFLUENZA  VACCINE  Completed  . DEXA SCAN  Completed  . PNA vac Low Risk Adult  Completed      Plan:   ***   I have personally reviewed and noted the following in the patient's chart:   . Medical and social history . Use of alcohol, tobacco or illicit drugs  . Current medications and supplements . Functional ability and status . Nutritional status . Physical activity . Advanced directives . List of other physicians . Hospitalizations, surgeries, and ER visits in previous 12 months . Vitals . Screenings to include cognitive, depression, and falls . Referrals and appointments  In addition, I have reviewed and discussed with patient certain preventive protocols, quality metrics, and best practice recommendations. A written personalized care plan for preventive services as well as general preventive health recommendations were provided to patient.     Jerilee Field, [place]  12/30/2016

## 2017-01-01 ENCOUNTER — Ambulatory Visit (INDEPENDENT_AMBULATORY_CARE_PROVIDER_SITE_OTHER): Payer: PPO | Admitting: Family Medicine

## 2017-01-01 ENCOUNTER — Encounter: Payer: Self-pay | Admitting: Family Medicine

## 2017-01-01 VITALS — BP 108/71 | HR 93 | Ht 62.25 in | Wt 224.0 lb

## 2017-01-01 DIAGNOSIS — N898 Other specified noninflammatory disorders of vagina: Secondary | ICD-10-CM

## 2017-01-01 DIAGNOSIS — T3695XA Adverse effect of unspecified systemic antibiotic, initial encounter: Secondary | ICD-10-CM | POA: Diagnosis not present

## 2017-01-01 DIAGNOSIS — N949 Unspecified condition associated with female genital organs and menstrual cycle: Secondary | ICD-10-CM | POA: Diagnosis not present

## 2017-01-01 MED ORDER — FLUCONAZOLE 150 MG PO TABS: 150.0000 mg | ORAL_TABLET | Freq: Once | ORAL | 1 refills | Status: AC

## 2017-01-01 NOTE — Patient Instructions (Signed)

## 2017-01-01 NOTE — Progress Notes (Signed)
Pt here for an acute care OV today   Impression and Recommendations:    1. Vaginal burning   2. Itching in the vaginal area   3. Antibiotic causing adverse effect    -Use Diflucan 1 time right now then repeat in 1 week.  1 refill given. -If symptoms continue follow-up with [place]. -We will send for yeast and BV culture. There are no diagnoses linked to this encounter.  There are no diagnoses linked to this encounter.  No problem-specific Assessment & Plan notes found for this encounter.    Education and routine counseling performed. Handouts provided.  No orders of the defined types were placed in this encounter.   Gross side effects, risk and benefits, and alternatives of medications and treatment plan in general discussed with patient.  Patient is aware that all medications have potential side effects and we are unable to predict every side effect or drug-drug interaction that may occur.   Patient will call with any questions prior to using medication if they have concerns.  Expresses verbal understanding and consents to current therapy and treatment regimen.  No barriers to understanding were identified.  Red flag symptoms and signs discussed in detail.  Patient expressed understanding regarding what to do in case of emergency\urgent symptoms  Please see AVS handed out to patient at the end of our visit for further patient instructions/ counseling done pertaining to today's office visit.   No Follow-up on file.     Note: This document was prepared using Dragon voice recognition software and may include unintentional dictation errors.  Mellody Dance 2:40 PM --------------------------------------------------------------------------------------------------------------------------------------------------------------------------------------------------------------------------------------------    Subjective:    CC:  Chief Complaint  Patient presents with  . Vaginal  Itching    HPI: Shelly Hickman is a 76 y.o. female who presents to [place] [facility] today for issues as discussed below.  Vaginal burning and itching on and off for a couple weeks especially since taking the antibiotics back on 12/10/2016 for submandibular gland infection and she was treated with Augmentin.  She has tried over-the-counter Monistat cream miconazole 2%.  Which she started just yesterday last night and applied it 1-2 times.    Wt Readings from Last 3 Encounters:  01/01/17 224 lb (101.6 kg)  12/30/16 225 lb (102.1 kg)  12/10/16 224 lb 14.4 oz (102 kg)   BP Readings from Last 3 Encounters:  01/01/17 108/71  12/30/16 111/77  12/10/16 123/80   BMI Readings from Last 3 Encounters:  01/01/17 40.64 kg/m  12/30/16 37.16 kg/m  12/10/16 37.14 kg/m     Patient Care Team    Relationship Specialty Notifications Start End  Mellody Dance, DO PCP - General Family Medicine  08/30/15   Almedia Balls, MD Consulting Physician Orthopedic Surgery  10/18/15   Kristeen Miss, MD Consulting Physician Neurosurgery  10/18/15    Comment: spine sx  Allyn Kenner, MD Consulting Physician Dermatology  10/18/15    Comment: skin screening   Elsie Saas, MD Consulting Physician Orthopedic Surgery  02/20/16   [facility], MD Attending Physician   10/17/16      Patient Active Problem List   Diagnosis Date Noted  . Morbid obesity ([place]) 09/09/2015    Priority: High  . Prediabetes 09/06/2015    Priority: High  . Hypertriglyceridemia 09/06/2015    Priority: High  . HLD (hyperlipidemia) 08/30/2015    Priority: High  . Essential hypertension, benign 07/14/2013    Priority: High  .  Anxiety 07/14/2013    Priority: High  . Insomnia 07/14/2013    Priority: High  . Depression 03/26/2011    Priority: High  . Low HDL (under 40) 09/06/2015    Priority: Medium  . Incontinence of urine 09/05/2013    Priority: Medium  . GERD (gastroesophageal reflux  disease) 12/10/2012    Priority: Medium  . Sialoadenitis- submandibular gland 12/10/2016  . Pre-op evaluation 05/30/2016  . 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 Encounter for wellness examination 12/31/2015  . Primary localized osteoarthritis of right knee 12/12/2015  . Spinal stenosis in cervical region 10/18/2015  . Spinal stenosis of lumbar region 10/18/2015  . Memory impairment 08/30/2015  . Vitamin D deficiency 08/30/2015  . Status post left hip replacement 07/14/2013  . Constipation 07/14/2013  . Allergic rhinitis 07/14/2013  . Osteopenia 06/27/2011  . Lacrimal canalicular stenosis 71/07/2692  . Degenerative joint disease involving multiple joints 04/06/2010    Past Medical history, Surgical history, Family history, Social history, Allergies and Medications have been entered into the medical record, reviewed and changed as needed.    Current Meds  Medication Sig  . acetaminophen (TYLENOL) 325 MG tablet Take 2 tablets (650 mg total) by mouth every 6 (six) hours as needed for mild pain (or Fever >/= 101).  Marland Kitchen amLODipine (NORVASC) 5 MG tablet Take 1 tablet (5 mg total) by mouth daily before breakfast.  . aspirin EC 81 MG tablet Take 81 mg by mouth daily.  . Calcium Carb-Cholecalciferol (CALCIUM 600-D PO) Take 1 tablet by mouth daily.  . calcium carbonate (TUMS EX) 750 MG chewable tablet Chew 2 tablets by mouth daily as needed for heartburn.  . Cholecalciferol (VITAMIN D-3) 5000 units TABS Take 5,000 Units by mouth daily.  . DULoxetine (CYMBALTA) 60 MG capsule TAKE 2 CAPSULES BY MOUTH DAILY BEFORE BREAKFAST  . fluticasone (CUTIVATE) 0.05 % cream Apply 1 application topically 2 (two) times daily as needed (irritation).   . gabapentin (NEURONTIN) 300 MG capsule TAKE 3 CAPSULES BY MOUTH 3 TIMES DAILY  . gabapentin (NEURONTIN) 300 MG capsule TAKE 3 CAPSULES BY MOUTH 3 TIMES DAILY  . ketoconazole (NIZORAL) 2 % cream Apply 1 application topically 2 (two) times daily as  needed for irritation.  Marland Kitchen losartan-hydrochlorothiazide (HYZAAR) 100-25 MG tablet Take 1 tablet by mouth daily before breakfast.  . montelukast (SINGULAIR) 10 MG tablet Take 10 mg by mouth daily before breakfast.   . niacin (NIASPAN) 500 MG CR tablet Take 1 tablet by mouth at bedtime.   Marland Kitchen omega-3 acid ethyl esters (LOVAZA) 1 g capsule Take 2 capsules nightly before bed  . oxybutynin (DITROPAN-XL) 10 MG 24 hr tablet Take 1 tablet (10 mg total) by mouth at bedtime.  . simvastatin (ZOCOR) 10 MG tablet TAKE ONE (1) TABLET BY MOUTH AT BEDTIME  . simvastatin (ZOCOR) 40 MG tablet Take 1 tablet (40 mg total) by mouth at bedtime.  . traZODone (DESYREL) 100 MG tablet TAKE 1 TABLET BY MOUTH EACH NIGHT AT BEDTIME  . [DISCONTINUED] amoxicillin-clavulanate (AUGMENTIN) 875-125 MG tablet Take 1 tablet by mouth 2 (two) times daily.    Allergies:  Allergies  Allergen Reactions  . Avelox [Moxifloxacin Hcl In Nacl] Other (See Comments)    CHEST PAIN OF UNSPECIFIED ORIGIN  . Cephalosporins Hives and Itching  . Other Itching and Swelling    UNSPECIFIED INSECT STINGS     Review of Systems: General:   Denies fever, chills, unexplained weight loss.  Optho/Auditory:   Denies visual changes,  blurred vision/LOV Respiratory:   Denies wheeze, DOE more than baseline levels.  Cardiovascular:   Denies chest pain, palpitations, new onset peripheral edema  Gastrointestinal:   Denies nausea, vomiting, diarrhea, abd pain.  Genitourinary: Denies dysuria, freq/ urgency, flank pain or discharge from genitals.  Endocrine:     Denies hot or cold intolerance, polyuria, polydipsia. Musculoskeletal:   Denies unexplained myalgias, joint swelling, unexplained arthralgias, gait problems.  Skin:  Denies new onset rash, suspicious lesions Neurological:     Denies dizziness, unexplained weakness, numbness  Psychiatric/Behavioral:   Denies mood changes, suicidal or homicidal ideations, hallucinations    Objective:   Blood  pressure 108/71, pulse 93, height 5' 2.25" (1.581 m), weight 224 lb (101.6 kg). Body mass index is 40.64 kg/m. General:  Well Developed, well nourished, appropriate for stated age.  Neuro:  Alert and oriented,  extra-ocular muscles intact  HEENT:  Normocephalic, atraumatic, neck supple Skin:  no gross rash, warm, pink. Cardiac:  RRR, S1 S2 Respiratory:  ECTA B/L and A/P, Not using accessory muscles, speaking in full sentences- unlabored. GU; external vaginal canal erythematous and raw appearing.  Thin whitish discharge in vaginal vault.  Mild to medium discomfort patient with examination and placement.  Sample obtained. Pt unable to tolerate bi-manual Vascular:  Ext warm, no cyanosis apprec.; cap RF less 2 sec. Psych:  No HI/SI, judgement and insight good, Euthymic mood. Full Affect.

## 2017-01-04 LAB — NUSWAB VAGINITIS PLUS (VG+)
Candida albicans, NAA: POSITIVE — AB
Candida glabrata, NAA: NEGATIVE
Chlamydia trachomatis, NAA: NEGATIVE
Neisseria gonorrhoeae, NAA: NEGATIVE
Trich vag by NAA: NEGATIVE

## 2017-01-20 ENCOUNTER — Ambulatory Visit (INDEPENDENT_AMBULATORY_CARE_PROVIDER_SITE_OTHER): Payer: PPO

## 2017-01-20 ENCOUNTER — Encounter (INDEPENDENT_AMBULATORY_CARE_PROVIDER_SITE_OTHER): Payer: Self-pay | Admitting: Orthopedic Surgery

## 2017-01-20 ENCOUNTER — Ambulatory Visit (INDEPENDENT_AMBULATORY_CARE_PROVIDER_SITE_OTHER): Payer: PPO | Admitting: Orthopedic Surgery

## 2017-01-20 DIAGNOSIS — M79672 Pain in left foot: Secondary | ICD-10-CM | POA: Diagnosis not present

## 2017-01-20 DIAGNOSIS — G5762 Lesion of plantar nerve, left lower limb: Secondary | ICD-10-CM

## 2017-01-20 MED ORDER — METHYLPREDNISOLONE ACETATE 80 MG/ML IJ SUSP
80.0000 mg | INTRAMUSCULAR | Status: AC | PRN
  Administered 2017-01-20: 80 mg via INTRA_ARTICULAR

## 2017-01-20 MED ORDER — LIDOCAINE HCL 1 % IJ SOLN
1.0000 mL | INTRAMUSCULAR | Status: AC | PRN
  Administered 2017-01-20: 1 mL

## 2017-01-20 NOTE — Progress Notes (Signed)
Office Visit Note   Patient: Shelly Hickman           Date of Birth: Oct 08, 1940           MRN: [id number] Visit Date: 01/20/2017              Requested by: Mellody Dance, DO [place], [place] [address] PCP: Mellody Dance, DO  Chief Complaint  Patient presents with  . Left Foot - Follow-up      HPI: Patient is 76 year old woman who is seen in follow-up for Morton's neuroma left foot third webspace.  Patient states she was here 2 years ago but did not want an injection at that time.  She states the pain has gotten worse and she is still symptomatic despite wide shoe wear.  Assessment & Plan: Visit Diagnoses:  1. Pain in left foot   2. Morton neuroma, left     Plan: Patient underwent a third webspace injection without complication she will continue with wide shoe wear.  Discussed that if she is still symptomatic we could repeat an injection.  If she fails injections and excision of the neuroma is an option.  Follow-Up Instructions: Return if symptoms worsen or fail to improve.   Ortho Exam  Patient is alert, oriented, no adenopathy, well-dressed, normal affect, normal respiratory effort. Examination patient has good pulses she has minimal tenderness to palpation over the metatarsal heads she is point tender to palpation in the third webspace.  Lateral compression of the metatarsal heads reproduces pain and a clunk.  Imaging: Xr Foot Complete Left  Result Date: 01/20/2017 Three-view radiographs of the left foot shows no bony abnormalities no fractures.  No images are attached to the encounter.  Labs: Lab Results  Component Value Date   HGBA1C 5.9 09/03/2016   HGBA1C 5.9 (H) 03/05/2016   HGBA1C 6.0 12/28/2015   REPTSTATUS 03/05/2016 FINAL 03/04/2016   GRAMSTAIN  12/11/2012    RARE WBC PRESENT, PREDOMINANTLY PMN NO SQUAMOUS EPITHELIAL CELLS SEEN NO ORGANISMS SEEN Performed at [facility], SUGGEST  RECOLLECTION (A) 03/04/2016    @LABSALLVALUES (HGBA1)@  There is no height or weight on file to calculate BMI.  Orders:  Orders Placed This Encounter  Procedures  . XR Foot Complete Left   No orders of the defined types were placed in this encounter.    Procedures: Small Joint Inj: L intertarsal on 01/20/2017 10:57 AM Indications: pain and diagnostic evaluation Details: 22 G needle, dorsal approach  Spinal Needle: No  Medications: 1 mL lidocaine 1 %; 80 mg methylPREDNISolone acetate 80 MG/ML Outcome: tolerated well, no immediate complications Procedure, treatment alternatives, risks and benefits explained, specific risks discussed. Consent was given by the patient. Immediately prior to procedure a time out was called to verify the correct patient, procedure, equipment, support staff and site/side marked as required. Patient was prepped and draped in the usual sterile fashion.      Clinical Data: No additional findings.  ROS:  All other systems negative, except as noted in the HPI. Review of Systems  Objective: Vital Signs: There were no vitals taken for this visit.  Specialty Comments:  No specialty comments available.  PMFS History: Patient Active Problem List   Diagnosis Date Noted  . Morton neuroma, left 01/20/2017  . Pain in left foot 01/20/2017  . Sialoadenitis- submandibular gland 12/10/2016  . Pre-op evaluation 05/30/2016  . 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 Encounter for wellness  examination 12/31/2015  . Primary localized osteoarthritis of right knee 12/12/2015  . Spinal stenosis in cervical region 10/18/2015  . Spinal stenosis of lumbar region 10/18/2015  . Morbid obesity ([place]) 09/09/2015  . Prediabetes 09/06/2015  . Low HDL (under 40) 09/06/2015  . Hypertriglyceridemia 09/06/2015  . HLD (hyperlipidemia) 08/30/2015  . Memory impairment 08/30/2015  . Vitamin D deficiency 08/30/2015  . Incontinence of urine 09/05/2013  . Status  post left hip replacement 07/14/2013  . Essential hypertension, benign 07/14/2013  . Constipation 07/14/2013  . Allergic rhinitis 07/14/2013  . Anxiety 07/14/2013  . Insomnia 07/14/2013  . GERD (gastroesophageal reflux disease) 12/10/2012  . Osteopenia 06/27/2011  . Depression 03/26/2011  . Lacrimal canalicular stenosis 88/50/2774  . Degenerative joint disease involving multiple joints 04/06/2010   Past Medical History:  Diagnosis Date  . Anemia   . Anxiety   . Arthritis   . Depression   . H/O hiatal hernia   . Hypercholesterolemia   . Hypertension   . Incontinence of urine   . Neuromuscular disorder ([place])   . Osteoarthritis     Family History  Problem Relation Age of Onset  . Hypertension Mother   . Diabetes Mother   . Stroke Mother   . Alcoholism Father   . Alcohol abuse Father   . Depression Sister     Past Surgical History:  Procedure Laterality Date  . 2 Synovial Cysts removed between L4-L5    . ABDOMINAL HYSTERECTOMY    . BACK SURGERY     back fusion after 2 back surgeries   . CARPAL TUNNEL RELEASE    . COLONOSCOPY    . EYE SURGERY     tube inserted for excessive tearing , tube is removed   . Right Knee Arthroscopy    . SALPINGOOPHORECTOMY  1993  . TONSILLECTOMY    . 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   Procedure: TOTAL HIP ARTHROPLASTY;  Surgeon: Kerin Salen, MD;  Location: [facility];  Service: Orthopedics;  Laterality: Left;  . TOTAL KNEE ARTHROPLASTY Right 03/04/2016   Procedure: TOTAL KNEE ARTHROPLASTY;  Surgeon: Elsie Saas, MD;  Location: [facility];  Service: Orthopedics;  Laterality: Right;  . WISDOM TOOTH EXTRACTION     Social History   Occupational History  . Not on file  Tobacco Use  . Smoking status: Former Smoker    Types: Cigarettes    Last attempt to quit: 02/18/1965    Years since quitting: 51.9  . Smokeless tobacco: Never Used  Substance and Sexual Activity  . Alcohol use: No  . Drug use: No  . Sexual activity: Not Currently

## 2017-01-22 DIAGNOSIS — R351 Nocturia: Secondary | ICD-10-CM | POA: Diagnosis not present

## 2017-01-22 DIAGNOSIS — N3946 Mixed incontinence: Secondary | ICD-10-CM | POA: Diagnosis not present

## 2017-01-25 ENCOUNTER — Other Ambulatory Visit: Payer: Self-pay | Admitting: Family Medicine

## 2017-03-04 ENCOUNTER — Ambulatory Visit: Payer: PPO | Admitting: Family Medicine

## 2017-03-15 ENCOUNTER — Other Ambulatory Visit: Payer: Self-pay | Admitting: Family Medicine

## 2017-03-15 DIAGNOSIS — I10 Essential (primary) hypertension: Secondary | ICD-10-CM

## 2017-03-20 DIAGNOSIS — N3946 Mixed incontinence: Secondary | ICD-10-CM | POA: Diagnosis not present

## 2017-03-20 DIAGNOSIS — R351 Nocturia: Secondary | ICD-10-CM | POA: Diagnosis not present

## 2017-03-25 ENCOUNTER — Other Ambulatory Visit: Payer: Self-pay | Admitting: Family Medicine

## 2017-04-07 ENCOUNTER — Ambulatory Visit (INDEPENDENT_AMBULATORY_CARE_PROVIDER_SITE_OTHER): Payer: PPO | Admitting: Orthopedic Surgery

## 2017-04-07 VITALS — Ht 62.25 in | Wt 224.0 lb

## 2017-04-07 DIAGNOSIS — G5762 Lesion of plantar nerve, left lower limb: Secondary | ICD-10-CM | POA: Diagnosis not present

## 2017-04-07 MED ORDER — METHYLPREDNISOLONE ACETATE 40 MG/ML IJ SUSP
40.0000 mg | INTRAMUSCULAR | Status: AC | PRN
  Administered 2017-04-07: 40 mg

## 2017-04-07 MED ORDER — LIDOCAINE HCL 1 % IJ SOLN
1.0000 mL | INTRAMUSCULAR | Status: AC | PRN
  Administered 2017-04-07: 1 mL

## 2017-04-07 NOTE — Progress Notes (Signed)
Office Visit Note   Patient: Shelly Hickman           Date of Birth: December 09, 1940           MRN: [id number] Visit Date: 04/07/2017              Requested by: Mellody Dance, DO [place], [place] [address] PCP: Mellody Dance, DO  Chief Complaint  Patient presents with  . Left Foot - Pain      HPI: Patient is 77 year old woman who is seen in follow-up for Morton's neuroma left foot third webspace.  Patient states she had about 2 weeks of relief from injection in December. Is interested in proceeding with surgical intervention.  She states the pain has returned and she is still symptomatic despite wide shoe wear.  Assessment & Plan: Visit Diagnoses:  1. Morton neuroma, left     Plan: Patient underwent a third webspace injection without complication today.  she will continue with wide shoe wear.  Discussed the option of excision of the neuroma. Will have Cheryl call her to set up.  Follow-Up Instructions: Return if symptoms worsen or fail to improve.   Ortho Exam  Patient is alert, oriented, no adenopathy, well-dressed, normal affect, normal respiratory effort. Examination patient has good pulses she has minimal tenderness to palpation over the metatarsal heads she is point tender to palpation in the third webspace.  Lateral compression of the metatarsal heads reproduces pain and a clunk.  Imaging: No results found. No images are attached to the encounter.  Labs: Lab Results  Component Value Date   HGBA1C 5.9 09/03/2016   HGBA1C 5.9 (H) 03/05/2016   HGBA1C 6.0 12/28/2015   REPTSTATUS 03/05/2016 FINAL 03/04/2016   GRAMSTAIN  12/11/2012    RARE WBC PRESENT, PREDOMINANTLY PMN NO SQUAMOUS EPITHELIAL CELLS SEEN NO ORGANISMS SEEN Performed at [facility], SUGGEST RECOLLECTION (A) 03/04/2016    @LABSALLVALUES (HGBA1)@  Body mass index is 40.64 kg/m.  Orders:  No orders of the defined types were placed in  this encounter.  No orders of the defined types were placed in this encounter.    Procedures: Foot Inj Date/Time: 04/07/2017 11:09 AM Performed by: Suzan Slick, NP Authorized by: Suzan Slick, NP   Consent Given by:  Patient Indications:  Neuroma Condition: Morton's Neuroma   Location:  L foot Prep: patient was prepped and draped in usual sterile fashion   Needle Size:  22 G Approach:  Dorsal Medications:  1 mL lidocaine 1 %; 40 mg methylPREDNISolone acetate 40 MG/ML Patient Tolerance:  Patient tolerated the procedure well with no immediate complications    Clinical Data: No additional findings.  ROS:  All other systems negative, except as noted in the HPI. Review of Systems  Constitutional: Negative for chills and fever.  Musculoskeletal: Positive for arthralgias.  Neurological: Negative for weakness.    Objective: Vital Signs: Ht 5' 2.25" (1.581 m)   Wt 224 lb (101.6 kg)   BMI 40.64 kg/m   Specialty Comments:  No specialty comments available.  PMFS History: Patient Active Problem List   Diagnosis Date Noted  . Morton neuroma, left 01/20/2017  . Pain in left foot 01/20/2017  . Sialoadenitis- submandibular gland 12/10/2016  . Pre-op evaluation 05/30/2016  . 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 Encounter for wellness examination 12/31/2015  . Primary localized osteoarthritis of right knee 12/12/2015  . Spinal stenosis in cervical region 10/18/2015  .  Spinal stenosis of lumbar region 10/18/2015  . Morbid obesity ([place]) 09/09/2015  . Prediabetes 09/06/2015  . Low HDL (under 40) 09/06/2015  . Hypertriglyceridemia 09/06/2015  . HLD (hyperlipidemia) 08/30/2015  . Memory impairment 08/30/2015  . Vitamin D deficiency 08/30/2015  . Incontinence of urine 09/05/2013  . Status post left hip replacement 07/14/2013  . Essential hypertension, benign 07/14/2013  . Constipation 07/14/2013  . Allergic rhinitis 07/14/2013  . Anxiety 07/14/2013  .  Insomnia 07/14/2013  . GERD (gastroesophageal reflux disease) 12/10/2012  . Osteopenia 06/27/2011  . Depression 03/26/2011  . Lacrimal canalicular stenosis 58/52/7782  . Degenerative joint disease involving multiple joints 04/06/2010   Past Medical History:  Diagnosis Date  . Anemia   . Anxiety   . Arthritis   . Depression   . H/O hiatal hernia   . Hypercholesterolemia   . Hypertension   . Incontinence of urine   . Neuromuscular disorder ([place])   . Osteoarthritis     Family History  Problem Relation Age of Onset  . Hypertension Mother   . Diabetes Mother   . Stroke Mother   . Alcoholism Father   . Alcohol abuse Father   . Depression Sister     Past Surgical History:  Procedure Laterality Date  . 2 Synovial Cysts removed between L4-L5    . ABDOMINAL HYSTERECTOMY    . BACK SURGERY     back fusion after 2 back surgeries   . CARPAL TUNNEL RELEASE    . COLONOSCOPY    . EYE SURGERY     tube inserted for excessive tearing , tube is removed   . Right Knee Arthroscopy    . SALPINGOOPHORECTOMY  1993  . TONSILLECTOMY    . 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   Procedure: TOTAL HIP ARTHROPLASTY;  Surgeon: Kerin Salen, MD;  Location: [facility];  Service: Orthopedics;  Laterality: Left;  . TOTAL KNEE ARTHROPLASTY Right 03/04/2016   Procedure: TOTAL KNEE ARTHROPLASTY;  Surgeon: Elsie Saas, MD;  Location: [facility];  Service: Orthopedics;  Laterality: Right;  . WISDOM TOOTH EXTRACTION     Social History   Occupational History  . Not on file  Tobacco Use  . Smoking status: Former Smoker    Types: Cigarettes    Last attempt to quit: 02/18/1965    Years since quitting: 52.1  . Smokeless tobacco: Never Used  Substance and Sexual Activity  . Alcohol use: No  . Drug use: No  . Sexual activity: Not Currently

## 2017-04-10 ENCOUNTER — Ambulatory Visit: Payer: PPO | Admitting: Family Medicine

## 2017-04-16 ENCOUNTER — Ambulatory Visit (INDEPENDENT_AMBULATORY_CARE_PROVIDER_SITE_OTHER): Payer: PPO | Admitting: Family Medicine

## 2017-04-16 ENCOUNTER — Other Ambulatory Visit: Payer: Self-pay | Admitting: Family Medicine

## 2017-04-16 ENCOUNTER — Encounter: Payer: Self-pay | Admitting: Family Medicine

## 2017-04-16 VITALS — BP 132/76 | HR 80 | Ht 62.25 in | Wt 221.0 lb

## 2017-04-16 DIAGNOSIS — F419 Anxiety disorder, unspecified: Secondary | ICD-10-CM

## 2017-04-16 DIAGNOSIS — F32A Depression, unspecified: Secondary | ICD-10-CM

## 2017-04-16 DIAGNOSIS — E781 Pure hyperglyceridemia: Secondary | ICD-10-CM

## 2017-04-16 DIAGNOSIS — E782 Mixed hyperlipidemia: Secondary | ICD-10-CM

## 2017-04-16 DIAGNOSIS — F329 Major depressive disorder, single episode, unspecified: Secondary | ICD-10-CM | POA: Diagnosis not present

## 2017-04-16 DIAGNOSIS — E786 Lipoprotein deficiency: Secondary | ICD-10-CM | POA: Diagnosis not present

## 2017-04-16 DIAGNOSIS — R7303 Prediabetes: Secondary | ICD-10-CM

## 2017-04-16 DIAGNOSIS — F5102 Adjustment insomnia: Secondary | ICD-10-CM | POA: Diagnosis not present

## 2017-04-16 DIAGNOSIS — I10 Essential (primary) hypertension: Secondary | ICD-10-CM

## 2017-04-16 LAB — POCT GLYCOSYLATED HEMOGLOBIN (HGB A1C): Hemoglobin A1C: 5.7

## 2017-04-16 MED ORDER — NIACIN ER (ANTIHYPERLIPIDEMIC) 500 MG PO TBCR: 500.0000 mg | EXTENDED_RELEASE_TABLET | Freq: Every day | ORAL | 3 refills | Status: DC

## 2017-04-16 MED ORDER — OMEGA-3-ACID ETHYL ESTERS 1 G PO CAPS: ORAL_CAPSULE | ORAL | 3 refills | Status: DC

## 2017-04-16 NOTE — Patient Instructions (Addendum)
PT Goals:  - pt wants to walk 1 mile 3 d/wk - Her other goal is to try to drink 3 24 ounce ounces of water per day- for a total of almost 75 oz/ day -Goal is to not gain any weight for next office visit in 4 months     Nine ways to increase your "good" HDL cholesterol  High-density lipoprotein (HDL) is often referred to as the "good" cholesterol. Having high HDL levels helps carry cholesterol from your arteries to your liver, where it can be used or excreted.  Having high levels of HDL also has antioxidant and anti-inflammatory effects, and is linked to a reduced risk of heart disease (1, 2).  Most health experts recommend minimum blood levels of 40 mg/dl in men and 50 mg/dl in women.  While genetics definitely play a role, there are several other factors that affect HDL levels.  Here are nine healthy ways to raise your "good" HDL cholesterol.  1. Consume olive oil  two pieces of salmon on a plate olive oil being poured into a small dish Extra virgin olive oil may be more healthful than processed olive oils. Olive oil is one of the healthiest fats around.  A large analysis of 42 studies with more than 800,000 participants found that olive oil was the only source of monounsaturated fat that seemed to reduce heart disease risk (3).  Research has shown that one of olive oil's heart-healthy effects is an increase in HDL cholesterol. This effect is thought to be caused by antioxidants it contains called polyphenols (4, 5, 6, 7).  Extra virgin olive oil has more polyphenols than more processed olive oils, although the amount can still vary among different types and brands.  One study gave 200 healthy young men about 2 tablespoons (25 ml) of different olive oils per day for three weeks.  The researchers found that participants' HDL levels increased significantly more after they consumed the olive oil with the highest polyphenol content (6).  In another study, when 18 older adults  consumed about 4 tablespoons (50 ml) of high-polyphenol extra virgin olive oil every day for six weeks, their HDL cholesterol increased by 6.5 mg/dl, on average (7).  In addition to raising HDL levels, olive oil has been found to boost HDL's anti-inflammatory and antioxidant function in studies of older people and individuals with high cholesterol levels ( 7, 8, 9).  Whenever possible, select high-quality, certified extra virgin olive oils, which tend to be highest in polyphenols.  Bottom line: Extra virgin olive oil with a high polyphenol content has been shown to increase HDL levels in healthy people, the elderly and individuals with high cholesterol.  2. Follow a low-carb or ketogenic diet  Low-carb and ketogenic diets provide a number of health benefits, including weight loss and reduced blood sugar levels.  They have also been shown to increase HDL cholesterol in people who tend to have lower levels.  This includes those who are obese, insulin resistant or diabetic (10, 11, 12, 13, 14, 15, 16, 17).  In one study, people with type 2 diabetes were split into two groups.  One followed a diet consuming less than 50 grams of carbs per day. The other followed a high-carb diet.  Although both groups lost weight, the low-carb group's HDL cholesterol increased almost twice as much as the high-carb group's did (14).  In another study, obese people who followed a low-carb diet experienced an increase in HDL cholesterol of 5 mg/dl overall.  Meanwhile,  in the same study, the participants who ate a low-fat, high-carb diet showed a decrease in HDL cholesterol (15).  This response may partially be due to the higher levels of fat people typically consume on low-carb diets.  One study in overweight women found that diets high in meat and cheese increased HDL levels by 5-8%, compared to a higher-carb diet (18).  What's more, in addition to raising HDL cholesterol, very-low-carb diets have been shown  to decrease triglycerides and improve several other risk factors for heart disease (13, 14, 16, 17).  Bottom line: Low-carb and ketogenic diets typically increase HDL cholesterol levels in people with diabetes, metabolic syndrome and obesity.  3. Exercise regularly  Being physically active is important for heart health.  Studies have shown that many different types of exercise are effective at raising HDL cholesterol, including strength training, high-intensity exercise and aerobic exercise (19, 20, 21, 22, 23, 24).  However, the biggest increases in HDL are typically seen with high-intensity exercise.  One small study followed women who were living with polycystic ovary syndrome (PCOS), which is linked to a higher risk of insulin resistance. The study required them to perform high-intensity exercise three times a week.  The exercise led to an increase in HDL cholesterol of 8 mg/dL after 10 weeks. The women also showed improvements in other health markers, including decreased insulin resistance and improved arterial function (23).  In a 12-week study, overweight men who performed high-intensity exercise experienced a 10% increase in HDL cholesterol.  In contrast, the low-intensity exercise group showed only a 2% increase and the endurance training group experienced no change (24).  However, even lower-intensity exercise seems to increase HDL's anti-inflammatory and antioxidant capacities, whether or not HDL levels change (20, 21, 25).  Overall, high-intensity exercise such as high-intensity interval training (HIIT) and high-intensity circuit training (HICT) may boost HDL cholesterol levels the most.  Bottom line: Exercising several times per week can help raise HDL cholesterol and enhance its anti-inflammatory and antioxidant effects. High-intensity forms of exercise may be especially effective.  4. Add coconut oil to your diet  Studies have shown that coconut oil may reduce appetite,  increase metabolic rate and help protect brain health, among other benefits.  Some people may be concerned about coconut oil's effects on heart health due to its high saturated fat content.  However, it appears that coconut oil is actually quite heart healthy.  Coconut oil tends to raise HDL cholesterol more than many other types of fat.  In addition, it may improve the ratio of low-density-lipoprotein (LDL) cholesterol, the "bad" cholesterol, to HDL cholesterol. Improving this ratio reduces heart disease risk (26, 27, 28, 29).  One study examined the health effects of coconut oil on 40 women with excess belly fat. The researchers found that participants who took coconut oil daily experienced increased HDL cholesterol and a lower LDL-to-HDL ratio.  In contrast, the group who took soybean oil daily had a decrease in HDL cholesterol and an increase in the LDL-to-HDL ratio (29).  Most studies have found these health benefits occur at a dosage of about 2 tablespoons (30 ml) of coconut oil per day. It's best to incorporate this into cooking rather than eating spoonfuls of coconut oil on their own.  Bottom line: Consuming 2 tablespoons (30 ml) of coconut oil per day may help increase HDL cholesterol levels.  5. Stop smoking  cigarette butt Quitting smoking can reduce the risk of heart disease and lung cancer. Smoking increases the risk of  many health problems, including heart disease and lung cancer (30).  One of its negative effects is a suppression of HDL cholesterol.  Some studies have found that quitting smoking can increase HDL levels. Indeed, one study found no significant differences in HDL levels between former smokers and people who had never smoked (31, 32, 33, 34, 35).  In a one-year study of more than 1,500 people, those who quit smoking had twice the increase in HDL as those who resumed smoking within the year. The number of large HDL particles also increased, which further reduced  heart disease risk (32).  One study followed smokers who switched from traditional cigarettes to electronic cigarettes for one year. They found that the switch was associated with an increase in HDL cholesterol of 5 mg/dl, on average (33).  When it comes to the effect of nicotine replacement patches on HDL levels, research results have been mixed.  One study found that nicotine replacement therapy led to higher HDL cholesterol. However, other research suggests that people who use nicotine patches likely won't see increases in HDL levels until after replacement therapy is completed (34, 36).  Even in studies where HDL cholesterol levels didn't increase after people quit smoking, HDL function improved, resulting in less inflammation and other beneficial effects on heart health (37).  Bottom line: Quitting smoking can increase HDL levels, improve HDL function and help protect heart health.  6. Lose weight  When overweight and obese people lose weight, their HDL cholesterol levels usually increase.  What's more, this benefit seems to occur whether weight loss is achieved by calorie counting, carb restriction, intermittent fasting, weight loss surgery or a combination of diet and exercise (16, 38, 39, 40, 41, 42).  One study examined HDL levels in more than 3,000 overweight and obese [place] adults who followed a lifestyle modification program for one year.  The researchers found that losing at least 6.6 lbs (3 kg) led to an increase in HDL cholesterol of 4 mg/dl, on average (41).  In another study, when obese people with type 2 diabetes consumed calorie-restricted diets that provided 20-30% of calories from protein, they experienced significant increases in HDL cholesterol levels (42).  The key to achieving and maintaining healthy HDL cholesterol levels is choosing the type of diet that makes it easiest for you to lose weight and keep it off.  Bottom Line: Several methods of weight loss have  been shown to increase HDL cholesterol levels in people who are overweight or obese.  7. Choose purple produce  Consuming purple-colored fruits and vegetables is a delicious way to potentially increase HDL cholesterol.  Purple produce contains antioxidants known as anthocyanins.  Studies using anthocyanin extracts have shown that they help fight inflammation, protect your cells from damaging free radicals and may also raise HDL cholesterol levels (43, 44, 45, 46).  In a 24-week study of 27 people with diabetes, those who took an anthocyanin supplement twice a day experienced a 19% increase in HDL cholesterol, on average, along with other improvements in heart health markers (45).  In another study, when people with cholesterol issues took anthocyanin extract for 12 weeks, their HDL cholesterol levels increased by 13.7% (46).  Although these studies used extracts instead of foods, there are several fruits and vegetables that are very high in anthocyanins. These include eggplant, purple corn, red cabbage, blueberries, blackberries and black raspberries.  Bottom line: Consuming fruits and vegetables rich in anthocyanins may help increase HDL cholesterol levels.  8. Eat fatty fish often  The omega-3 fats in fatty fish provide major benefits to heart health, including a reduction in inflammation and better functioning of the cells that line your arteries (47, 48).  There's some research showing that eating fatty fish or taking fish oil may also help raise low levels of HDL cholesterol (49, 50, 51, 52, 53).  In a study of 33 heart disease patients, participants that consumed fatty fish four times per week experienced an increase in HDL cholesterol levels. The particle size of their HDL also increased (52).  In another study, overweight men who consumed herring five days a week for six weeks had a 5% increase in HDL cholesterol, compared with their levels after eating lean pork and chicken five  days a week (53).  However, there are a few studies that found no increase in HDL cholesterol in response to increased fish or omega-3 supplement intake (54, 55).  In addition to herring, other types of fatty fish that may help raise HDL cholesterol include salmon, sardines, mackerel and anchovies.  Bottom line: Eating fatty fish several times per week may help increase HDL cholesterol levels and provide other benefits to heart health.  9. Avoid artificial trans fats  Artificial trans fats have many negative health effects due to their inflammatory properties (56, 57).  There are two types of trans fats. One kind occurs naturally in animal products, including full-fat dairy.  In contrast, the artificial trans fats found in margarines and processed foods are created by adding hydrogen to unsaturated vegetable and seed oils. These fats are also known as industrial trans fats or partially hydrogenated fats.  Research has shown that, in addition to increasing inflammation and contributing to several health problems, these artificial trans fats may lower HDL cholesterol levels.  In one study, researchers compared how people's HDL levels responded when they consumed different margarines.  The study found that participants' HDL cholesterol levels were 10% lower after consuming margarine containing partially hydrogenated soybean oil, compared to their levels after consuming palm oil (58).  Another controlled study followed 40 adults who had diets high in different types of trans fats.  They found that HDL cholesterol levels in women were significantly lower after they consumed the diet high in industrial trans fats, compared to the diet containing naturally occurring trans fats (59).  To protect heart health and keep HDL cholesterol in the healthy range, it's best to avoid artificial trans fats altogether.  Bottom line: Artificial trans fats have been shown to lower HDL levels and increase  inflammation, compared to other fats.  Take home message  Although your HDL cholesterol levels are partly determined by your genetics, there are many things you can do to naturally increase your own levels.  Fortunately, the practices that raise HDL cholesterol often provide other health benefits as well.

## 2017-04-16 NOTE — Progress Notes (Signed)
Impression and Recommendations:    1. Prediabetes   2. Morbid obesity ([place])   3. Adjustment insomnia   4. Hypertriglyceridemia   5. Mixed hyperlipidemia   6. Depression, unspecified depression type   7. Essential hypertension, benign   8. Low HDL (under 40)   9. Anxiety    1.  Prediabetes A1C today is 5.7.   - Advised the patient that she is still prediabetic and at high risk of becoming a diabetic. - Emphasized that she will always be sensitive to sugars, but she can maintain her A1C.  - Advised her that to lower her A1C, she's either lost weight, increased her activity levels and physical exercise more, or eating better.  - Continue healthy lifestyle changes as recommended.  - Advised patient to continue working toward exercising to improve health.    - Patient may begin with 15 minutes of activity daily.  Recommended that the patient eventually strive for at least 150 minutes of cardio per week according to guidelines established by the [facility].   - Healthy dietary habits encouraged, including low-carb, and high amounts of lean protein in diet.   - Patient should also consume adequate amounts of water - half of body weight in oz of water per day  Explained to patient what BMI refers to, and what it means medically.    Told patient to think about it as a "medical risk stratification measurement" and how increasing BMI is associated with increasing risk/ or worsening state of various diseases such as hypertension, hyperlipidemia, diabetes, premature OA, depression etc.  American Heart Association guidelines for healthy diet, basically Mediterranean diet, and exercise guidelines of 30 minutes 5 days per week or more discussed in detail.  Health counseling performed.  All questions answered.  2.  HTN 132/76 - re-checked in office by Dr. Raliegh Scarlet in the left arm.  - Continue on amlodipine, and combination hyzaar as prescribed. - Patient tolerates these meds well.  No  changes made.  - Patient should begin measuring her blood pressure regularly at home, especially if she's feeling strange.  - Patient knows that she should keep a BP log to bring in to next appointment.  3.  Mood - Continue on Cymbalta as prescribed. - Patient tolerates it well.  No changes made.  - Emphasized that patient may also continue managing her diet, exercise, sleep habits, and seek counseling to help improve her mood.  4.  Vitamin D - Resume taking her Vitamin D as prescribed.  5. HLD - She may continue taking both niacin and lovaza. - Patient tolerates the niacin well, taking one tablet before bedtime.  - Advised the patient that lovaza helps with mood, focus, memory, and many other benefits aside from cholesterol.  - Patient is still tolerating the zocor.  - Handout provided on how to improve good cholesterol, and education provided on cholesterol management in general.  6. Patient Goals  - Patient wants to walk one mile, 3 days per week. - Advised patient to drink at least 3 of her full 24 oz cups of water per day.  Total of almost 75 oz. per day. - Goal is to not gain any weight for next OV in 4 months - and if weight is lost, fantastic.  7. Follow-Up Prediabetes, HTN, HLD, Mood, Weight.   Education and routine counseling performed. Handouts provided.  Orders Placed This Encounter  Procedures  . POCT glycosylated hemoglobin (Hb A1C)    Meds ordered this encounter  Medications  . niacin (NIASPAN) 500 MG CR tablet    Sig: Take 1 tablet (500 mg total) by mouth at bedtime.    Dispense:  90 tablet    Refill:  3  . omega-3 acid ethyl esters (LOVAZA) 1 g capsule    Sig: Take 2 capsules nightly before bed    Dispense:  180 capsule    Refill:  3    The patient was counseled, risk factors were discussed, anticipatory guidance given.  Gross side effects, risk and benefits, and alternatives of medications discussed with patient.  Patient is aware that all  medications have potential side effects and we are unable to predict every side effect or drug-drug interaction that may occur.  Expresses verbal understanding and consents to current therapy plan and treatment regimen.  Return in about 4 months (around 08/14/2017) for f/up pre-DM, HTN, HLD, Mood, Wt check.  Please see AVS handed out to patient at the end of our visit for further patient instructions/ counseling done pertaining to today's office visit.    Note: This document was prepared using Dragon voice recognition software and may include unintentional dictation errors.   This document serves as a record of services personally performed by Mellody Dance, DO. It was created on her behalf by Toni Amend, a trained medical scribe. The creation of this record is based on the scribe's personal observations and the provider's statements to them.   I have reviewed the above medical documentation for accuracy and completeness and I concur.  Mellody Dance 04/26/17 4:31 PM    Subjective:    HPI: Shelly Hickman is a 77 y.o. female who presents to [place] at [facility] today for follow up for HTN.    Overall, she notes that her stress has been pretty good lately and "thinks she's doing pretty okay."   Continues taking Cymbalta and neurontin.  Knows that walking, exercising, dieting, and drinking more water can all help with mood improvement.  Ran out of Vitamin D and has not been taking it recently due to running out.  Still taking lovaza and niacin, though ran out of at least one prescription (niacin).  Still doing well on zocor.  Just called in her trazadone to prepare for her 9-night trip coming up. Had her blood pressure medications refilled recently as well.  Prediabetes: A1C today is 5.7.  Notes that she "hasn't been behaving herself," and is surprised that her A1C has gone down to 5.7 from 5.9 - especially over the holidays.  Patient remarks,  however, that she has cut back on bread and sugar, "for the most part." Notes that she has been exercising more as well.  [facility]  HTN:  -  Her blood pressure may or may not be controlled at home.  Has not been measuring her blood pressure at home.  132/76 - re-checked in office by Dr. Raliegh Scarlet in the left arm.  Notes that her blood pressure usually falls around normal, but she's been under stress lately.  Husband has Bell's Palsy and she's been worried about him, given his diabetes and weight. She is getting ready to go to [place] to see her granddaughter.  Lost her son-in-law recently.  - Patient reports good compliance with blood pressure medications  - Denies medication S-E   - Smoking Status noted   - She denies new onset of: chest pain, exercise intolerance, shortness of breath, dizziness, visual changes, headache,  lower extremity swelling or claudication.    Last 3 blood pressure readings in our office are as follows: BP Readings from Last 3 Encounters:  04/16/17 132/76  01/01/17 108/71  12/30/16 111/77    Pulse Readings from Last 3 Encounters:  04/16/17 80  01/01/17 93  12/30/16 86    Filed Weights   04/16/17 1443  Weight: 221 lb (100.2 kg)      Patient Care Team    Relationship Specialty Notifications Start End  Mellody Dance, DO PCP - General Family Medicine  08/30/15   Almedia Balls, MD Consulting Physician Orthopedic Surgery  10/18/15   Kristeen Miss, MD Consulting Physician Neurosurgery  10/18/15    Comment: spine sx  Allyn Kenner, MD Consulting Physician Dermatology  10/18/15    Comment: skin screening   Elsie Saas, MD Consulting Physician Orthopedic Surgery  02/20/16   [facility] [place], MD Attending Physician   10/17/16      Lab Results  Component Value Date   CREATININE 0.85 10/04/2016   BUN 20 10/04/2016   NA 137 10/04/2016   K 4.3 10/04/2016   CL 97 10/04/2016   CO2 25 10/04/2016    Lab Results    Component Value Date   CHOL 185 10/04/2016   CHOL 294 (H) 08/31/2015   CHOL 189 12/21/2014    Lab Results  Component Value Date   HDL 43 10/04/2016   HDL 35 (L) 08/31/2015   HDL 45 12/21/2014    Lab Results  Component Value Date   LDLCALC 111 (H) 10/04/2016   LDLCALC 224 (H) 08/31/2015   LDLCALC 117 12/21/2014    Lab Results  Component Value Date   TRIG 153 (H) 10/04/2016   TRIG 175 (H) 08/31/2015   TRIG 134 12/21/2014    Lab Results  Component Value Date   CHOLHDL 4.3 10/04/2016   CHOLHDL 8.4 (H) 08/31/2015    No results found for: LDLDIRECT ===================================================================   Patient Active Problem List   Diagnosis Date Noted  . Morbid obesity ([place]) 09/09/2015    Priority: High  . Prediabetes 09/06/2015    Priority: High  . Hypertriglyceridemia 09/06/2015    Priority: High  . HLD (hyperlipidemia) 08/30/2015    Priority: High  . Essential hypertension, benign 07/14/2013    Priority: High  . Anxiety 07/14/2013    Priority: High  . Insomnia 07/14/2013    Priority: High  . Depression 03/26/2011    Priority: High  . Low HDL (under 40) 09/06/2015    Priority: Medium  . Incontinence of urine 09/05/2013    Priority: Medium  . GERD (gastroesophageal reflux disease) 12/10/2012    Priority: Medium  . Morton neuroma, left 01/20/2017  . Pain in left foot 01/20/2017  . Sialoadenitis- submandibular gland 12/10/2016  . Pre-op evaluation 05/30/2016  . 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 Encounter for wellness examination 12/31/2015  . Primary localized osteoarthritis of right knee 12/12/2015  . Spinal stenosis in cervical region 10/18/2015  . Spinal stenosis of lumbar region 10/18/2015  . Memory impairment 08/30/2015  . Vitamin D deficiency 08/30/2015  . Status post left hip replacement 07/14/2013  . Constipation 07/14/2013  . Allergic rhinitis 07/14/2013  . Osteopenia 06/27/2011  . Lacrimal canalicular  stenosis 24/10/7351  . Degenerative joint disease involving multiple joints 04/06/2010     Past Medical History:  Diagnosis Date  . Anemia   . Anxiety   . Arthritis   . Depression   . H/O hiatal hernia   .  Hypercholesterolemia   . Hypertension   . Incontinence of urine   . Neuromuscular disorder ([place])   . Osteoarthritis      Past Surgical History:  Procedure Laterality Date  . 2 Synovial Cysts removed between L4-L5    . ABDOMINAL HYSTERECTOMY    . BACK SURGERY     back fusion after 2 back surgeries   . CARPAL TUNNEL RELEASE    . COLONOSCOPY    . EYE SURGERY     tube inserted for excessive tearing , tube is removed   . Right Knee Arthroscopy    . SALPINGOOPHORECTOMY  1993  . TONSILLECTOMY    . 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   Procedure: TOTAL HIP ARTHROPLASTY;  Surgeon: Kerin Salen, MD;  Location: [facility];  Service: Orthopedics;  Laterality: Left;  . TOTAL KNEE ARTHROPLASTY Right 03/04/2016   Procedure: TOTAL KNEE ARTHROPLASTY;  Surgeon: Elsie Saas, MD;  Location: [facility];  Service: Orthopedics;  Laterality: Right;  . WISDOM TOOTH EXTRACTION       Family History  Problem Relation Age of Onset  . Hypertension Mother   . Diabetes Mother   . Stroke Mother   . Alcoholism Father   . Alcohol abuse Father   . Depression Sister      Social History   Substance and Sexual Activity  Drug Use No  ,  Social History   Substance and Sexual Activity  Alcohol Use No  ,  Social History   Tobacco Use  Smoking Status Former Smoker  . Types: Cigarettes  . Last attempt to quit: 02/18/1965  . Years since quitting: 52.2  Smokeless Tobacco Never Used  ,    Current Outpatient Medications on File Prior to Visit  Medication Sig Dispense Refill  . acetaminophen (TYLENOL) 325 MG tablet Take 2 tablets (650 mg total) by mouth every 6 (six) hours as needed for mild pain (or Fever >/= 101).    Marland Kitchen amLODipine (NORVASC) 5 MG tablet TAKE 1 TABLET BY MOUTH DAILY BEFORE  BREAKFAST 90 tablet 1  . aspirin EC 81 MG tablet Take 81 mg by mouth daily.    . Calcium Carb-Cholecalciferol (CALCIUM 600-D PO) Take 1 tablet by mouth daily.    . calcium carbonate (TUMS EX) 750 MG chewable tablet Chew 2 tablets by mouth daily as needed for heartburn.    . Cholecalciferol (VITAMIN D-3) 5000 units TABS Take 5,000 Units by mouth daily.    . DULoxetine (CYMBALTA) 60 MG capsule TAKE 2 CAPSULES BY MOUTH DAILY BEFORE BREAKFAST 180 capsule 0  . gabapentin (NEURONTIN) 300 MG capsule TAKE 3 CAPSULES BY MOUTH 3 TIMES DAILY 270 capsule 2  . losartan-hydrochlorothiazide (HYZAAR) 100-25 MG tablet Take 1 tablet by mouth daily before breakfast. 90 tablet 1  . oxybutynin (DITROPAN-XL) 10 MG 24 hr tablet Take 1 tablet (10 mg total) by mouth at bedtime. 90 tablet 1  . simvastatin (ZOCOR) 10 MG tablet TAKE ONE (1) TABLET BY MOUTH AT BEDTIME 90 tablet 1   No current facility-administered medications on file prior to visit.      Allergies  Allergen Reactions  . Avelox [Moxifloxacin Hcl In Nacl] Other (See Comments)    CHEST PAIN OF UNSPECIFIED ORIGIN  . Cephalosporins Hives and Itching  . Other Itching and Swelling    UNSPECIFIED INSECT STINGS     Review of Systems:   General:  Denies fever, chills Optho/Auditory:   Denies visual changes, blurred vision Respiratory:   Denies SOB, cough, wheeze, DIB  Cardiovascular:   Denies chest pain, palpitations, painful respirations Gastrointestinal:   Denies nausea, vomiting, diarrhea.  Endocrine:     Denies new hot or cold intolerance Musculoskeletal:  Denies joint swelling, gait issues, or new unexplained myalgias/ arthralgias Skin:  Denies rash, suspicious lesions  Neurological:    Denies dizziness, unexplained weakness, numbness  Psychiatric/Behavioral:   Denies mood changes  Objective:    Blood pressure 132/76, pulse 80, height 5' 2.25" (1.581 m), weight 221 lb (100.2 kg), SpO2 98 %.  Body mass index is 40.1 kg/m.  General: Well  Developed, well nourished, and in no acute distress.  HEENT: Normocephalic, atraumatic, pupils equal round reactive to light, neck supple, No carotid bruits, no JVD Skin: Warm and dry, cap RF less 2 sec Cardiac: Regular rate and rhythm, S1, S2 WNL's, no murmurs rubs or gallops Respiratory: ECTA B/L, Not using accessory muscles, speaking in full sentences. NeuroM-Sk: Ambulates w/o assistance, moves ext * 4 w/o difficulty, sensation grossly intact.  Ext: scant edema b/l lower ext Psych: No HI/SI, judgement and insight good, Euthymic mood. Full Affect.

## 2017-04-21 ENCOUNTER — Telehealth (INDEPENDENT_AMBULATORY_CARE_PROVIDER_SITE_OTHER): Payer: Self-pay | Admitting: Orthopedic Surgery

## 2017-04-21 NOTE — Telephone Encounter (Signed)
Add weil osteotomy of 2nd and 3rd metatarsal left foot to the consent

## 2017-04-21 NOTE — Telephone Encounter (Signed)
Please see message below and advise. thanks

## 2017-04-21 NOTE — Telephone Encounter (Signed)
See note from Dr. Sharol Given. Thank you!

## 2017-04-21 NOTE — Telephone Encounter (Signed)
Patient is scheduled 05/13/17 @[facility]  for LEFT EXCISION MORTON'S NEUROMA. She is asking if Dr Sharol Given can take care of the hammer toes on the LEFT FOOT during the surgery. I will need to add the procedure code to the surgery sheet if the answer is yes.

## 2017-05-01 ENCOUNTER — Encounter: Payer: Self-pay | Admitting: Family Medicine

## 2017-05-01 ENCOUNTER — Telehealth: Payer: Self-pay | Admitting: Family Medicine

## 2017-05-01 ENCOUNTER — Ambulatory Visit (INDEPENDENT_AMBULATORY_CARE_PROVIDER_SITE_OTHER): Payer: PPO | Admitting: Family Medicine

## 2017-05-01 VITALS — BP 101/68 | HR 78 | Ht 66.0 in | Wt 220.0 lb

## 2017-05-01 DIAGNOSIS — F419 Anxiety disorder, unspecified: Secondary | ICD-10-CM | POA: Diagnosis not present

## 2017-05-01 DIAGNOSIS — F329 Major depressive disorder, single episode, unspecified: Secondary | ICD-10-CM

## 2017-05-01 DIAGNOSIS — R7303 Prediabetes: Secondary | ICD-10-CM | POA: Diagnosis not present

## 2017-05-01 DIAGNOSIS — B370 Candidal stomatitis: Secondary | ICD-10-CM | POA: Insufficient documentation

## 2017-05-01 DIAGNOSIS — F32A Depression, unspecified: Secondary | ICD-10-CM

## 2017-05-01 MED ORDER — FLUCONAZOLE 150 MG PO TABS: 150.0000 mg | ORAL_TABLET | Freq: Every day | ORAL | 0 refills | Status: AC

## 2017-05-01 MED ORDER — NYSTATIN 100000 UNIT/ML MT SUSP: 500000.0000 [IU] | Freq: Four times a day (QID) | OROMUCOSAL | 0 refills | Status: DC

## 2017-05-01 MED ORDER — NYSTATIN 100000 UNIT/ML MT SUSP: OROMUCOSAL | 0 refills | Status: DC

## 2017-05-01 NOTE — Progress Notes (Signed)
Pt here for an acute care OV today   Impression and Recommendations:    1. Thrush, oral   2. Morbid obesity ([place])   3. Prediabetes   4. Anxiety   5. Depression, unspecified depression type     1. Thrush, oral -start Nystatin swish and swallow.  -start diflucan, take one day, then another tomorrow.  May repeat with 1 tablet 1 week later -do salt water gargles 2-3 times a day.  2. Explained increased risk for thrush due to BMI; glucose intolerance etc. preventive strategies discussed with patient.  3. Anxiety and depression improving since her daughter in [place] is doing much better since the recent death of her husband, patient's son-in-law this past fall.  Patient stable on meds, declines need for adjustment  Meds ordered this encounter  Medications  . DISCONTD: nystatin (MYCOSTATIN) 100000 UNIT/ML suspension    Sig: As directed 4 times daily.    Dispense:  180 mL    Refill:  0  . fluconazole (DIFLUCAN) 150 MG tablet    Sig: Take 1 tablet (150 mg total) by mouth daily for 3 days.    Dispense:  3 tablet    Refill:  0  . nystatin (MYCOSTATIN) 100000 UNIT/ML suspension    Sig: Take 5 mLs (500,000 Units total) by mouth 4 (four) times daily. Swish for 30 seconds and spit out.    Dispense:  180 mL    Refill:  0     Education and routine counseling performed. Handouts provided  Gross side effects, risk and benefits, and alternatives of medications and treatment plan in general discussed with patient.  Patient is aware that all medications have potential side effects and we are unable to predict every side effect or drug-drug interaction that may occur.   Patient will call with any questions prior to using medication if they have concerns.  Expresses verbal understanding and consents to current therapy and treatment regimen.  No barriers to understanding were identified.  Red flag symptoms and signs discussed in detail.  Patient expressed understanding regarding what to do in  case of emergency\urgent symptoms   Please see AVS handed out to patient at the end of our visit for further patient instructions/ counseling done pertaining to today's office visit.   Return for F-up of current med issues as previously d/c pt.     Note: This document was prepared occasionally using Dragon voice recognition software and may include unintentional dictation errors in addition to a scribe.  This document serves as a record of services personally performed by Mellody Dance, DO. It was created on her behalf by Mayer Masker, a trained medical scribe. The creation of this record is based on the scribe's personal observations and the provider's statements to them.   I have reviewed the above medical documentation for accuracy and completeness and I concur.  Mellody Dance 05/02/17 1:19 PM  ----------------------------------------------------------------------------------------------------------------------------------------------------------------------------------------------------------------------------  Subjective:    CC:  Chief Complaint  Patient presents with  . Oral Pain    sore throat and soreness in the mouth x 10 days    HPI: Shelly Hickman is a 77 y.o. female who presents to [place] at [facility] today for issues as discussed below.  She thinks she has thrush. She states 10 days ago, she flew to [place] and she had a sore throat because she thought she had a pill lodged in her throat. She sprayed chloraseptic with mild to no relief, and her pain  began to spread across her mouth. She now has pain in her mouth that she describes this as pain- it is not burning. She also felt a bump in her mouth/throat. She took diflucan yesterday with relief (her daughter is a Software engineer).   No problems updated.   Wt Readings from Last 3 Encounters:  05/01/17 220 lb (99.8 kg)  04/16/17 221 lb (100.2 kg)  04/07/17 224 lb (101.6 kg)   BP Readings from  Last 3 Encounters:  05/01/17 101/68  04/16/17 132/76  01/01/17 108/71   BMI Readings from Last 3 Encounters:  05/01/17 35.51 kg/m  04/16/17 40.10 kg/m  04/07/17 40.64 kg/m     Patient Care Team    Relationship Specialty Notifications Start End  Mellody Dance, DO PCP - General Family Medicine  08/30/15   Almedia Balls, MD Consulting Physician Orthopedic Surgery  10/18/15   Kristeen Miss, MD Consulting Physician Neurosurgery  10/18/15    Comment: spine sx  Allyn Kenner, MD Consulting Physician Dermatology  10/18/15    Comment: skin screening   Elsie Saas, MD Consulting Physician Orthopedic Surgery  02/20/16   [facility] [place], MD Attending Physician   10/17/16      Patient Active Problem List   Diagnosis Date Noted  . Morbid obesity ([place]) 09/09/2015    Priority: High  . Prediabetes 09/06/2015    Priority: High  . Hypertriglyceridemia 09/06/2015    Priority: High  . HLD (hyperlipidemia) 08/30/2015    Priority: High  . Essential hypertension, benign 07/14/2013    Priority: High  . Anxiety 07/14/2013    Priority: High  . Insomnia 07/14/2013    Priority: High  . Depression 03/26/2011    Priority: High  . Low HDL (under 40) 09/06/2015    Priority: Medium  . Incontinence of urine 09/05/2013    Priority: Medium  . GERD (gastroesophageal reflux disease) 12/10/2012    Priority: Medium  . Thrush, oral 05/01/2017  . Morton neuroma, left 01/20/2017  . Pain in left foot 01/20/2017  . Sialoadenitis- submandibular gland 12/10/2016  . Pre-op evaluation 05/30/2016  . 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 Encounter for wellness examination 12/31/2015  . Primary localized osteoarthritis of right knee 12/12/2015  . Spinal stenosis in cervical region 10/18/2015  . Spinal stenosis of lumbar region 10/18/2015  . Memory impairment 08/30/2015  . Vitamin D deficiency 08/30/2015  . Status post left hip replacement 07/14/2013  . Constipation 07/14/2013  .  Allergic rhinitis 07/14/2013  . Osteopenia 06/27/2011  . Lacrimal canalicular stenosis 01/75/1025  . Degenerative joint disease involving multiple joints 04/06/2010    Past Medical history, Surgical history, Family history, Social history, Allergies and Medications have been entered into the medical record, reviewed and changed as needed.    Current Meds  Medication Sig  . acetaminophen (TYLENOL) 325 MG tablet Take 2 tablets (650 mg total) by mouth every 6 (six) hours as needed for mild pain (or Fever >/= 101).  Marland Kitchen amLODipine (NORVASC) 5 MG tablet TAKE 1 TABLET BY MOUTH DAILY BEFORE BREAKFAST  . aspirin EC 81 MG tablet Take 81 mg by mouth daily.  . Calcium Carb-Cholecalciferol (CALCIUM 600-D PO) Take 1 tablet by mouth daily.  . Cholecalciferol (VITAMIN D-3) 5000 units TABS Take 5,000 Units by mouth daily.  . DULoxetine (CYMBALTA) 60 MG capsule TAKE 2 CAPSULES BY MOUTH DAILY BEFORE BREAKFAST  . gabapentin (NEURONTIN) 300 MG capsule TAKE 3 CAPSULES BY MOUTH 3 TIMES DAILY  . losartan-hydrochlorothiazide (HYZAAR) 100-25 MG tablet  Take 1 tablet by mouth daily before breakfast.  . niacin (NIASPAN) 500 MG CR tablet Take 1 tablet (500 mg total) by mouth at bedtime.  Marland Kitchen omega-3 acid ethyl esters (LOVAZA) 1 g capsule Take 2 capsules nightly before bed  . oxybutynin (DITROPAN-XL) 10 MG 24 hr tablet Take 1 tablet (10 mg total) by mouth at bedtime.  . simvastatin (ZOCOR) 10 MG tablet TAKE ONE (1) TABLET BY MOUTH AT BEDTIME  . traZODone (DESYREL) 100 MG tablet TAKE 1 TABLET BY MOUTH EVERY NIGHT AT BEDTIME    Allergies:  Allergies  Allergen Reactions  . Avelox [Moxifloxacin Hcl In Nacl] Other (See Comments)    CHEST PAIN OF UNSPECIFIED ORIGIN  . Cephalosporins Hives and Itching  . Other Itching and Swelling    UNSPECIFIED INSECT STINGS     Review of Systems: General:   Denies fever, chills, unexplained weight loss.  Optho/Auditory:   Denies visual changes, blurred vision/LOV Respiratory:    Denies wheeze, DOE more than baseline levels.  Cardiovascular:   Denies chest pain, palpitations, new onset peripheral edema  Gastrointestinal:   Denies nausea, vomiting, diarrhea, abd pain.  Genitourinary: Denies dysuria, freq/ urgency, flank pain or discharge from genitals.  Endocrine:     Denies hot or cold intolerance, polyuria, polydipsia. Musculoskeletal:   Denies unexplained myalgias, joint swelling, unexplained arthralgias, gait problems.  Skin:  Denies new onset rash, suspicious lesions Neurological:     Denies dizziness, unexplained weakness, numbness  Psychiatric/Behavioral:   Denies mood changes, suicidal or homicidal ideations, hallucinations    Objective:   Blood pressure 101/68, pulse 78, height 5\' 6"  (1.676 m), weight 220 lb (99.8 kg), SpO2 98 %. Body mass index is 35.51 kg/m. General:  Well Developed, well nourished, appropriate for stated age.  Neuro:  Alert and oriented,  extra-ocular muscles intact  HEENT:  Normocephalic, atraumatic, neck supple Tongue- slightly edematous and erythematous and with thickened whitish greenish hue to posterior papillae of the tongue. Skin:  no gross rash, warm, pink. Cardiac:  RRR, S1 S2 Respiratory:  ECTA B/L and A/P, Not using accessory muscles, speaking in full sentences- unlabored. Vascular:  Ext warm, no cyanosis apprec.; cap RF less 2 sec. Psych:  No HI/SI, judgement and insight good, Euthymic mood. Full Affect.

## 2017-05-01 NOTE — Patient Instructions (Signed)
Oral Thrush, Adult Oral thrush, also called oral candidiasis, is a fungal infection that develops in the mouth and throat and on the tongue. It causes white patches to form on the mouth and tongue. Thrush is most common in older adults, but it can occur at any age. Many cases of thrush are mild, but this infection can also be serious. Thrush can be a repeated (recurrent) problem for certain people who have a weak body defense system (immune system). The weakness can be caused by chronic illnesses, or by taking medicines that limit the body's ability to fight infection. If a person has difficulty fighting infection, the fungus that causes thrush can spread through the body. This can cause life-threatening blood or organ infections. What are the causes? This condition is caused by a fungus (yeast) called Candida albicans.  This fungus is normally present in small amounts in the mouth and on other mucous membranes. It usually causes no harm.  If conditions are present that allow the fungus to grow without control, it invades surrounding tissues and becomes an infection.  Other Candida species can also lead to thrush (rare).  What increases the risk? This condition is more likely to develop in:  People with a weakened immune system.  Older adults.  People with HIV (human immunodeficiency virus).  People with diabetes.  People with dry mouth (xerostomia).  Pregnant women.  People with poor dental care, especially people who have false teeth.  People who use antibiotic medicines.  What are the signs or symptoms? Symptoms of this condition can vary from mild and moderate to severe and persistent. Symptoms may include:  A burning feeling in the mouth and throat. This can occur at the start of a thrush infection.  White patches that stick to the mouth and tongue. The tissue around the patches may be red, raw, and painful. If rubbed (during tooth brushing, for example), the patches and the  tissue of the mouth may bleed easily.  A bad taste in the mouth or difficulty tasting foods.  A cottony feeling in the mouth.  Pain during eating and swallowing.  Poor appetite.  Cracking at the corners of the mouth.  How is this diagnosed? This condition is diagnosed based on:  Physical exam. Your health care provider will look in your mouth.  Health history. Your health care provider will ask you questions about your health.  How is this treated? This condition is treated with medicines called antifungals, which prevent the growth of fungi. These medicines are either applied directly to the affected area (topical) or swallowed (oral). The treatment will depend on the severity of the condition. Mild thrush Mild cases of thrush may clear up with the use of an antifungal mouth rinse or lozenges. Treatment usually lasts about 14 days. Moderate to severe thrush  More severe thrush infections that have spread to the esophagus are treated with an oral antifungal medicine. A topical antifungal medicine may also be used.  For some severe infections, treatment may need to continue for more than 14 days.  Oral antifungal medicines are rarely used during pregnancy because they may be harmful to the unborn child. If you are pregnant, talk with your health care provider about options for treatment. Persistent or recurrent thrush For cases of thrush that do not go away or keep coming back:  Treatment may be needed twice as long as the symptoms last.  Treatment will include both oral and topical antifungal medicines.  People with a weakened immune   system can take an antifungal medicine on a continuous basis to prevent thrush infections.  It is important to treat conditions that make a person more likely to get thrush, such as diabetes or HIV. Follow these instructions at home: Medicines  Take over-the-counter and prescription medicines only as told by your health care provider.  Talk  with your health care provider about an over-the-counter medicine called gentian violet, which kills bacteria and fungi. Relieving soreness and discomfort To help reduce the discomfort of thrush:  Drink cold liquids such as water or iced tea.  Try flavored ice treats or frozen juices.  Eat foods that are easy to swallow, such as gelatin, ice cream, or custard.  Try drinking from a straw if the patches in your mouth are painful.  General instructions  Eat plain, unflavored yogurt as directed by your health care provider. Check the label to make sure the yogurt contains live cultures. This yogurt can help healthy bacteria to grow in the mouth and can stop the growth of the fungus that causes thrush.  If you wear dentures, remove the dentures before going to bed, brush them vigorously, and soak them in a cleaning solution as directed by your health care provider.  Rinse your mouth with a warm salt-water mixture several times a day. To make a salt-water mixture, completely dissolve 1/2-1 tsp of salt in 1 cup of warm water. Contact a health care provider if:  Your symptoms are getting worse or are not improving within 7 days of starting treatment.  You have symptoms of a spreading infection, such as white patches on the skin outside of the mouth. This information is not intended to replace advice given to you by your health care provider. Make sure you discuss any questions you have with your health care provider. Document Released: 10/31/2003 Document Revised: 10/30/2015 Document Reviewed: 10/30/2015 Elsevier Interactive Patient Education  2017 Elsevier Inc.  

## 2017-05-01 NOTE — Telephone Encounter (Signed)
Per Patient --[facility] says:  nystatin (MYCOSTATIN) 100000 UNIT/ML suspension [[id number]]  Order Details  Dose, Route, Frequency: As Directed   Dispense Quantity: 180 mL Refills: 0 Fills remaining: --        Sig: As directed 4 times daily.         On 4 week back order & has been unable to get a date that supply will be delivered.  Please call if questions / send alternative Rx to :   [place], [place]. [phone] (Phone) [phone] (Fax)   --Dion Body

## 2017-05-01 NOTE — Telephone Encounter (Signed)
Patient was seen in office today.  Please advise. MPulliam, CMA/RT(R)

## 2017-05-01 NOTE — Telephone Encounter (Signed)
Script sent to pts pharmacy

## 2017-05-01 NOTE — Telephone Encounter (Signed)
The "sig" on this prescription was corrected and resent to patient's pharmacy.

## 2017-05-08 ENCOUNTER — Other Ambulatory Visit: Payer: Self-pay | Admitting: Family Medicine

## 2017-05-08 DIAGNOSIS — I10 Essential (primary) hypertension: Secondary | ICD-10-CM

## 2017-05-13 DIAGNOSIS — M205X1 Other deformities of toe(s) (acquired), right foot: Secondary | ICD-10-CM | POA: Diagnosis not present

## 2017-05-13 DIAGNOSIS — G8918 Other acute postprocedural pain: Secondary | ICD-10-CM | POA: Diagnosis not present

## 2017-05-13 DIAGNOSIS — M2042 Other hammer toe(s) (acquired), left foot: Secondary | ICD-10-CM | POA: Diagnosis not present

## 2017-05-13 DIAGNOSIS — G5762 Lesion of plantar nerve, left lower limb: Secondary | ICD-10-CM | POA: Diagnosis not present

## 2017-05-20 ENCOUNTER — Inpatient Hospital Stay (INDEPENDENT_AMBULATORY_CARE_PROVIDER_SITE_OTHER): Payer: PPO | Admitting: Orthopedic Surgery

## 2017-05-21 ENCOUNTER — Ambulatory Visit (INDEPENDENT_AMBULATORY_CARE_PROVIDER_SITE_OTHER): Payer: PPO

## 2017-05-21 ENCOUNTER — Encounter (INDEPENDENT_AMBULATORY_CARE_PROVIDER_SITE_OTHER): Payer: Self-pay

## 2017-05-21 VITALS — Ht 66.0 in | Wt 220.0 lb

## 2017-05-21 DIAGNOSIS — G5762 Lesion of plantar nerve, left lower limb: Secondary | ICD-10-CM

## 2017-05-21 NOTE — Progress Notes (Signed)
Patient comes in today weightbearing in a post op shoe. She is 1 week s/p a left foot morton's neuroma excision. The pt states that she had not taken any pain medication since Friday and had been doing well. Yesterday her husband had eye surgery and she was sitting in the lobby for aver three hours with the foot down dependent. Patient states that since pain has returned but is managed with pain medication. The surgical dressing is removed. The incision is well approximated and there is a spot amount of sanguinous drainage. The stitches are intact and there is an area surrounding the incision that is red. Dr. Marlou Sa stepped in to evaluate and once the leg was elevated the redness did resolve. ABD pad and ace bandage was applied and additional supply was given to the pt. She is to wash the foot daily with Dial soap and water and apply dry dressing. Dicussed the signs and symptoms to watch for that would indicate infection. Pt advised to call the office with any changes and or questions. She will follow up with Dr. Sharol Given in the office in one week.    Shelly Hickman, [place], [employer]

## 2017-05-29 ENCOUNTER — Encounter (INDEPENDENT_AMBULATORY_CARE_PROVIDER_SITE_OTHER): Payer: Self-pay | Admitting: Orthopedic Surgery

## 2017-05-29 ENCOUNTER — Ambulatory Visit (INDEPENDENT_AMBULATORY_CARE_PROVIDER_SITE_OTHER): Payer: PPO | Admitting: Orthopedic Surgery

## 2017-05-29 VITALS — Ht 66.0 in | Wt 220.0 lb

## 2017-05-29 DIAGNOSIS — M205X2 Other deformities of toe(s) (acquired), left foot: Secondary | ICD-10-CM | POA: Insufficient documentation

## 2017-05-29 DIAGNOSIS — G5762 Lesion of plantar nerve, left lower limb: Secondary | ICD-10-CM

## 2017-05-29 NOTE — Progress Notes (Signed)
Office Visit Note   Patient: Shelly Hickman           Date of Birth: June 17, 1940           MRN: [id number] Visit Date: 05/29/2017              Requested by: Mellody Dance, DO [place], [place] [id number] PCP: Mellody Dance, DO  Chief Complaint  Patient presents with  . Left Foot - Routine Post Op    S/p morton's excision 2 weeks post op      HPI: Patient is a 77 year old woman who presents 2 weeks status post excision Morton's neuroma and Weil osteotomy for the second and third metatarsals left foot.  Patient complains of swelling and pain.  Assessment & Plan: Visit Diagnoses:  1. Morton neuroma, left   2. Claw toe, acquired, left     Plan: Discussed the importance of heel cord stretching elevation range of motion of the foot and ankle to decrease the swelling scar massage continue postoperative shoe follow-up in 2 weeks to evaluate for advancement to regular shoewear.  Follow-Up Instructions: Return in about 2 weeks (around 06/12/2017).   Ortho Exam  Patient is alert, oriented, no adenopathy, well-dressed, normal affect, normal respiratory effort. Examination patient does have some dependent redness around the incision with elevation the redness resolves there is no tenderness to palpation over the incision or around the incision.  There is no drainage no cellulitis.  We will harvest the sutures today.  Imaging: No results found. No images are attached to the encounter.  Labs: Lab Results  Component Value Date   HGBA1C 5.7 04/16/2017   HGBA1C 5.9 09/03/2016   HGBA1C 5.9 (H) 03/05/2016   REPTSTATUS 03/05/2016 FINAL 03/04/2016   GRAMSTAIN  12/11/2012    RARE WBC PRESENT, PREDOMINANTLY PMN NO SQUAMOUS EPITHELIAL CELLS SEEN NO ORGANISMS SEEN Performed at [facility], SUGGEST RECOLLECTION (A) 03/04/2016    @LABSALLVALUES (HGBA1)@  Body mass index is 35.51 kg/m.  Orders:  No orders of the defined  types were placed in this encounter.  No orders of the defined types were placed in this encounter.    Procedures: No procedures performed  Clinical Data: No additional findings.  ROS:  All other systems negative, except as noted in the HPI. Review of Systems  Objective: Vital Signs: Ht 5\' 6"  (1.676 m)   Wt 220 lb (99.8 kg)   BMI 35.51 kg/m   Specialty Comments:  No specialty comments available.  PMFS History: Patient Active Problem List   Diagnosis Date Noted  . Claw toe, acquired, left 05/29/2017  . Thrush, oral 05/01/2017  . Morton neuroma, left 01/20/2017  . Pain in left foot 01/20/2017  . Sialoadenitis- submandibular gland 12/10/2016  . Pre-op evaluation 05/30/2016  . 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 Encounter for wellness examination 12/31/2015  . Primary localized osteoarthritis of right knee 12/12/2015  . Spinal stenosis in cervical region 10/18/2015  . Spinal stenosis of lumbar region 10/18/2015  . Morbid obesity ([place]) 09/09/2015  . Prediabetes 09/06/2015  . Low HDL (under 40) 09/06/2015  . Hypertriglyceridemia 09/06/2015  . HLD (hyperlipidemia) 08/30/2015  . Memory impairment 08/30/2015  . Vitamin D deficiency 08/30/2015  . Incontinence of urine 09/05/2013  . Status post left hip replacement 07/14/2013  . Essential hypertension, benign 07/14/2013  . Constipation 07/14/2013  . Allergic rhinitis 07/14/2013  . Anxiety 07/14/2013  . Insomnia 07/14/2013  . GERD (  gastroesophageal reflux disease) 12/10/2012  . Osteopenia 06/27/2011  . Depression 03/26/2011  . 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 Degenerative joint disease involving multiple joints 04/06/2010   Past Medical History:  Diagnosis Date  . Anemia   . Anxiety   . Arthritis   . Depression   . H/O hiatal hernia   . Hypercholesterolemia   . Hypertension   . Incontinence of urine   . Neuromuscular disorder ([place])   . Osteoarthritis     Family History  Problem  Relation Age of Onset  . Hypertension Mother   . Diabetes Mother   . Stroke Mother   . Alcoholism Father   . Alcohol abuse Father   . Depression Sister     Past Surgical History:  Procedure Laterality Date  . 2 Synovial Cysts removed between L4-L5    . ABDOMINAL HYSTERECTOMY    . BACK SURGERY     back fusion after 2 back surgeries   . CARPAL TUNNEL RELEASE    . COLONOSCOPY    . EYE SURGERY     tube inserted for excessive tearing , tube is removed   . Right Knee Arthroscopy    . SALPINGOOPHORECTOMY  1993  . TONSILLECTOMY    . 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   Procedure: TOTAL HIP ARTHROPLASTY;  Surgeon: Kerin Salen, MD;  Location: [facility];  Service: Orthopedics;  Laterality: Left;  . TOTAL KNEE ARTHROPLASTY Right 03/04/2016   Procedure: TOTAL KNEE ARTHROPLASTY;  Surgeon: Elsie Saas, MD;  Location: [facility];  Service: Orthopedics;  Laterality: Right;  . WISDOM TOOTH EXTRACTION     Social History   Occupational History  . Not on file  Tobacco Use  . Smoking status: Former Smoker    Types: Cigarettes    Last attempt to quit: 02/18/1965    Years since quitting: 52.3  . Smokeless tobacco: Never Used  Substance and Sexual Activity  . Alcohol use: No  . Drug use: No  . Sexual activity: Not Currently

## 2017-06-04 ENCOUNTER — Other Ambulatory Visit: Payer: Self-pay | Admitting: Family Medicine

## 2017-06-12 ENCOUNTER — Ambulatory Visit (INDEPENDENT_AMBULATORY_CARE_PROVIDER_SITE_OTHER): Payer: PPO | Admitting: Orthopedic Surgery

## 2017-06-12 ENCOUNTER — Encounter (INDEPENDENT_AMBULATORY_CARE_PROVIDER_SITE_OTHER): Payer: Self-pay | Admitting: Orthopedic Surgery

## 2017-06-12 VITALS — Ht 66.0 in | Wt 220.0 lb

## 2017-06-12 DIAGNOSIS — M205X2 Other deformities of toe(s) (acquired), left foot: Secondary | ICD-10-CM

## 2017-06-12 DIAGNOSIS — G5762 Lesion of plantar nerve, left lower limb: Secondary | ICD-10-CM

## 2017-06-12 NOTE — Progress Notes (Signed)
Office Visit Note   Patient: Shelly Hickman           Date of Birth: [birth year]           MRN: [id number] Visit Date: 06/12/2017              Requested by: Mellody Dance, DO [place], [place] [address] PCP: Mellody Dance, DO  Chief Complaint  Patient presents with  . Left Foot - Routine Post Op    4 weeks left foot morton's neuroma neuroma excision      HPI: Patient is a 77 year old woman is 4 weeks status post Weil osteotomies for clot in the toes as well as a Morton's neuroma excision.  Patient states she feels fine and has no complaints.  Assessment & Plan: Visit Diagnoses:  1. Morton neuroma, left   2. Claw toe, acquired, left     Plan: Patient is in regular shoewear she will work on Achilles stretching and follow-up as needed.  He was also given instructions for scar massage and stretching of the toes  Follow-Up Instructions: Return if symptoms worsen or fail to improve.   Ortho Exam  Patient is alert, oriented, no adenopathy, well-dressed, normal affect, normal respiratory effort. Examination of the toes are straight there is no redness no cellulitis there is some mild swelling.  Patient has a little bit of scar tissue beneath the incision and the importance of scar massage was discussed.  Imaging: No results found. No images are attached to the encounter.  Labs: Lab Results  Component Value Date   HGBA1C 5.7 04/16/2017   HGBA1C 5.9 09/03/2016   HGBA1C 5.9 (H) 03/05/2016   REPTSTATUS 03/05/2016 FINAL 03/04/2016   GRAMSTAIN  12/11/2012    RARE WBC PRESENT, PREDOMINANTLY PMN NO SQUAMOUS EPITHELIAL CELLS SEEN NO ORGANISMS SEEN Performed at [facility], SUGGEST RECOLLECTION (A) 03/04/2016    @LABSALLVALUES (HGBA1)@  Body mass index is 35.51 kg/m.  Orders:  No orders of the defined types were placed in this encounter.  No orders of the defined types were placed in this encounter.    Procedures: No procedures performed  Clinical Data: No additional findings.  ROS:  All other systems negative, except as noted in the HPI. Review of Systems  Objective: Vital Signs: Ht 5\' 6"  (1.676 m)   Wt 220 lb (99.8 kg)   BMI 35.51 kg/m   Specialty Comments:  No specialty comments available.  PMFS History: Patient Active Problem List   Diagnosis Date Noted  . Claw toe, acquired, left 05/29/2017  . Thrush, oral 05/01/2017  . Morton neuroma, left 01/20/2017  . Pain in left foot 01/20/2017  . Sialoadenitis- submandibular gland 12/10/2016  . Pre-op evaluation 05/30/2016  . 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 Encounter for wellness examination 12/31/2015  . Primary localized osteoarthritis of right knee 12/12/2015  . Spinal stenosis in cervical region 10/18/2015  . Spinal stenosis of lumbar region 10/18/2015  . Morbid obesity ([place]) 09/09/2015  . Prediabetes 09/06/2015  . Low HDL (under 40) 09/06/2015  . Hypertriglyceridemia 09/06/2015  . HLD (hyperlipidemia) 08/30/2015  . Memory impairment 08/30/2015  . Vitamin D deficiency 08/30/2015  . Incontinence of urine 09/05/2013  . Status post left hip replacement 07/14/2013  . Essential hypertension, benign 07/14/2013  . Constipation 07/14/2013  . Allergic rhinitis 07/14/2013  . Anxiety 07/14/2013  . Insomnia 07/14/2013  . GERD (gastroesophageal reflux disease) 12/10/2012  . Osteopenia 06/27/2011  .  Depression 03/26/2011  . Lacrimal canalicular stenosis 54/65/0354  . Degenerative joint disease involving multiple joints 04/06/2010   Past Medical History:  Diagnosis Date  . Anemia   . Anxiety   . Arthritis   . Depression   . H/O hiatal hernia   . Hypercholesterolemia   . Hypertension   . Incontinence of urine   . Neuromuscular disorder ([place])   . Osteoarthritis     Family History  Problem Relation Age of Onset  . Hypertension Mother   . Diabetes Mother   . Stroke Mother   . Alcoholism Father    . Alcohol abuse Father   . Depression Sister     Past Surgical History:  Procedure Laterality Date  . 2 Synovial Cysts removed between L4-L5    . ABDOMINAL HYSTERECTOMY    . BACK SURGERY     back fusion after 2 back surgeries   . CARPAL TUNNEL RELEASE    . COLONOSCOPY    . EYE SURGERY     tube inserted for excessive tearing , tube is removed   . Right Knee Arthroscopy    . SALPINGOOPHORECTOMY  1993  . TONSILLECTOMY    . 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   Procedure: TOTAL HIP ARTHROPLASTY;  Surgeon: Kerin Salen, MD;  Location: [facility];  Service: Orthopedics;  Laterality: Left;  . TOTAL KNEE ARTHROPLASTY Right 03/04/2016   Procedure: TOTAL KNEE ARTHROPLASTY;  Surgeon: Elsie Saas, MD;  Location: [facility];  Service: Orthopedics;  Laterality: Right;  . WISDOM TOOTH EXTRACTION     Social History   Occupational History  . Not on file  Tobacco Use  . Smoking status: Former Smoker    Types: Cigarettes    Last attempt to quit: 02/18/1965    Years since quitting: 52.3  . Smokeless tobacco: Never Used  Substance and Sexual Activity  . Alcohol use: No  . Drug use: No  . Sexual activity: Not Currently

## 2017-06-19 ENCOUNTER — Other Ambulatory Visit: Payer: Self-pay | Admitting: Family Medicine

## 2017-06-20 ENCOUNTER — Other Ambulatory Visit: Payer: Self-pay | Admitting: Family Medicine

## 2017-06-24 ENCOUNTER — Encounter: Payer: Self-pay | Admitting: Family Medicine

## 2017-06-24 ENCOUNTER — Ambulatory Visit (INDEPENDENT_AMBULATORY_CARE_PROVIDER_SITE_OTHER): Payer: PPO | Admitting: Family Medicine

## 2017-06-24 VITALS — BP 138/86 | HR 90 | Ht 66.0 in | Wt 219.2 lb

## 2017-06-24 DIAGNOSIS — F419 Anxiety disorder, unspecified: Secondary | ICD-10-CM | POA: Diagnosis not present

## 2017-06-24 DIAGNOSIS — F329 Major depressive disorder, single episode, unspecified: Secondary | ICD-10-CM | POA: Diagnosis not present

## 2017-06-24 DIAGNOSIS — K582 Mixed irritable bowel syndrome: Secondary | ICD-10-CM | POA: Insufficient documentation

## 2017-06-24 DIAGNOSIS — K591 Functional diarrhea: Secondary | ICD-10-CM | POA: Insufficient documentation

## 2017-06-24 DIAGNOSIS — K5900 Constipation, unspecified: Secondary | ICD-10-CM | POA: Diagnosis not present

## 2017-06-24 DIAGNOSIS — F32A Depression, unspecified: Secondary | ICD-10-CM

## 2017-06-24 NOTE — Patient Instructions (Signed)
To keep you more regular please take MiraLAX daily to keep your chronic constipation under control.   Also please remember the importance of adequate hydration-one half of your weight in ounces of water per day. -On times when you do have acute bouts of diarrhea, please feel free to take Imodium A-D as this is a functional diarrhea and not infectious or anything like that.  Please note your dietary habits prior to onset of diarrhea to see if there is any trigger foods and/or to see if emotionally, you are upset and that might be triggering as well in a form of irritable bowel syndrome. -So please keep a food journal\mood journal.  Personal goal: -You will please go to the [facility] and go to young at heart classes for older folks at least 3 days/week.  This is extremely, extremely important to your physical and mental well-being. Also walking for 10-15 minutes 2 times per day at least 5 days a week is very beneficial.    -Also we need to transition your brain into thinking more positively.  These tasks below are some things I want you to do every day 1)  write 3 new things that you are grateful for every day for 21 days  2)  exercise daily- walk for 15 minutes twice a day every day 3)  you are going to journal every day about one positive experience that you had 4)  meditate every day.  You can go on YouTube and look for 15-minute relaxation meditation or what ever.  But we need to make sure that you are in the moment and relaxing and deep breathing every day 5)  Write 1 positive email every day to praise someone in your life     - If you have insomnia or difficulty sleeping, this information is for you:  - Avoid caffeinated beverages after lunch,  no alcoholic beverages,  no eating within 2-3 hours of lying down,  avoid exposure to blue light before bed,  avoid daytime naps, and  needs to maintain a regular sleep schedule- go to sleep and wake up around the same time every night.   -  Resolve concerns or worries before entering bedroom:  Discussed relaxation techniques with patient and to keep a journal to write down fears\ worries.  I suggested seeing a counselor for CBT.   - Recommend patient meditate or do deep breathing exercises to help relax.   Incorporate the use of white noise machines or listen to "sleep meditation music", or recordings of guided meditations for sleep from YouTube which are free, such as  "guided meditation for detachment from over thinking"  by Mayford Knife.

## 2017-06-24 NOTE — Progress Notes (Signed)
Pt here for an acute care OV today  Impression and Recommendations:    1. Functional diarrhea   2. Constipation, chronic   3. Anxiety   4. Depression, unspecified depression type   5. Irritable bowel syndrome with both constipation and diarrhea     1. Diarrhea & Chronic Constipation (IBS) - Advised patient to identify trigger foods such as fats, greasy foods, dairy, etc.  - If patient gets diarrhea, she should document everything she ate in the past 24 hours, the day before symptoms and the day of.  Patient should begin keeping a regular food journal to evaluate intolerances.  - If diarrhea occurs, take an antidiarrheal such as imodium.  - Begin Miralax OTC every day, and stool softeners PRN.  - Hydrate adequately, at least half of her body weight in ounces of water per day.  - Referral to gastroenterology advised today if the patient wishes.  Declines referral at this time.  2. Mood (Anxiety) - Get out and walk 10 minutes twice daily, each day.  Advised that this will help improve both mood and blood flow to the gut.  - Advised patient to take advantage of her membership to the [employer] program, get out of the house, and do something physically active while developing friendships with others.  - Recommended that the patient eventually strive for at least 150 minutes of moderate cardiovascular activity per week according to guidelines established by the [facility].   - Begin a personal positivity journal to focus on healthy self-talk and gratefulness.  3. Follow-Up - Has an upcoming appointment in June.  - Will evaluate progress on health goals at that time.  No orders of the defined types were placed in this encounter.   No orders of the defined types were placed in this encounter.    Education and routine counseling performed. Handouts provided  Gross side effects, risk and benefits, and alternatives of medications and treatment plan in general discussed with  patient.  Patient is aware that all medications have potential side effects and we are unable to predict every side effect or drug-drug interaction that may occur.   Patient will call with any questions prior to using medication if they have concerns.  Expresses verbal understanding and consents to current therapy and treatment regimen.  No barriers to understanding were identified.  Red flag symptoms and signs discussed in detail.  Patient expressed understanding regarding what to do in case of emergency\urgent symptoms   Please see AVS handed out to patient at the end of our visit for further patient instructions/ counseling done pertaining to today's office visit.   Return if symptoms worsen or fail to improve, for Follow-up for chronic care as scheduled in near future.     Note: This document was prepared occasionally using Dragon voice recognition software and may include unintentional dictation errors in addition to a scribe.  This document serves as a record of services personally performed by Mellody Dance, DO. It was created on her behalf by Toni Amend, a trained medical scribe. The creation of this record is based on the scribe's personal observations and the provider's statements to them.   I have reviewed the above medical documentation for accuracy and completeness and I concur.  Mellody Dance 06/24/17 12:43 PM   --------------------------------------------------------------------------------------------------------------------------------------------------------------------------    Subjective:    CC:  Chief Complaint  Patient presents with  . Diarrhea    HPI: Shelly Hickman is a 77 y.o. female who presents to  [facility] today for issues as discussed below.  Is having "issues with diarrhea."  Has had bouts of diarrhea before- many times in the past- for yrs and yrs, and admits to having an increase recently of her symptoms of  depression/anxiety.  Her son as well as other family members encouraged her to follow-up with me about her mood changes recently.   Issues with diarrhea acutely started last week on Monday, about 8 days ago.  She was out and noted that she needed to leave as soon as possible, and had diarrhea before they even left the parking lot.  Other episodes occurred at home.  Patient knows that she has been eating poorly lately.  Patient has never kept a food diary before.  Does not regularly see gastroenterology.  Had her colonoscopy done a couple of years ago with Dr. Cristina Gong.  Notes that the diarrhea "grabs me."   Says she strains sometimes when she goes to the bathroom, and she's drained and exhausted after each episode.    Patient on average only has a BM once every 2-3 days.  -Continues to struggle with her adequate water intake.  Denies fever, chills, recent camping, no nausea or vomiting, no constant abdominal pain.  Notes that the cramping occurs with each episode in her "lower abdomen."  Hasn't taken anything OTC or done anything to treat it.  Stools are completely back to normal now.    Wants to know why this is happening.  Says she's been depressed.  Currently takes 120 Cymbalta daily (2 capsules).  Mood Patient has only ever been on Cymbalta to treat her mood.  She was on 120 mg by her previous doctor and remains on 120 mg of Cymbalta now.  She has never tried any other medicine.   Notes that it's more depression, caused in part by her relationship with her husband-which is very strained and not supportive.  She feels safe at home and denies being abused physically.  Emotional abuse is not terrible either.  She denies suicidal or homicidal ideations.  -She has not gone to the [facility] as we have discussed many times in the past.  She has not been exercising over the past several months and has been cooped up in the house more than usual.  She feels all of these have contributed to her  symptoms.  Has been getting out of the house and walking 5-10 minutes each day-which she only started walking again last week.    Problem  Functional Diarrhea  Irritable Bowel Syndrome With Both Constipation and Diarrhea     Wt Readings from Last 3 Encounters:  06/24/17 219 lb 3.2 oz (99.4 kg)  06/12/17 220 lb (99.8 kg)  05/29/17 220 lb (99.8 kg)   BP Readings from Last 3 Encounters:  06/24/17 138/86  05/01/17 101/68  04/16/17 132/76   BMI Readings from Last 3 Encounters:  06/24/17 35.38 kg/m  06/12/17 35.51 kg/m  05/29/17 35.51 kg/m     Patient Care Team    Relationship Specialty Notifications Start End  Mellody Dance, DO PCP - General Family Medicine  08/30/15   Almedia Balls, MD Consulting Physician Orthopedic Surgery  10/18/15   Kristeen Miss, MD Consulting Physician Neurosurgery  10/18/15    Comment: spine sx  Allyn Kenner, MD Consulting Physician Dermatology  10/18/15    Comment: skin screening   Elsie Saas, MD Consulting Physician Orthopedic Surgery  02/20/16   [facility], MD Attending Physician  10/17/16      Patient Active Problem List   Diagnosis Date Noted  . Morbid obesity ([place]) 09/09/2015    Priority: High  . Prediabetes 09/06/2015    Priority: High  . Hypertriglyceridemia 09/06/2015    Priority: High  . HLD (hyperlipidemia) 08/30/2015    Priority: High  . Essential hypertension, benign 07/14/2013    Priority: High  . Anxiety 07/14/2013    Priority: High  . Insomnia 07/14/2013    Priority: High  . Depression 03/26/2011    Priority: High  . Low HDL (under 40) 09/06/2015    Priority: Medium  . Incontinence of urine 09/05/2013    Priority: Medium  . GERD (gastroesophageal reflux disease) 12/10/2012    Priority: Medium  . 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 Irritable bowel syndrome with both constipation and diarrhea 06/24/2017  . Claw toe, acquired, left 05/29/2017  . Thrush, oral 05/01/2017  . Morton neuroma, left  01/20/2017  . Pain in left foot 01/20/2017  . Sialoadenitis- submandibular gland 12/10/2016  . Pre-op evaluation 05/30/2016  . 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 Encounter for wellness examination 12/31/2015  . Primary localized osteoarthritis of right knee 12/12/2015  . Spinal stenosis in cervical region 10/18/2015  . Spinal stenosis of lumbar region 10/18/2015  . Memory impairment 08/30/2015  . Vitamin D deficiency 08/30/2015  . Status post left hip replacement 07/14/2013  . Constipation 07/14/2013  . Allergic rhinitis 07/14/2013  . Osteopenia 06/27/2011  . Lacrimal canalicular stenosis 65/04/5463  . Degenerative joint disease involving multiple joints 04/06/2010    Past Medical history, Surgical history, Family history, Social history, Allergies and Medications have been entered into the medical record, reviewed and changed as needed.    Current Meds  Medication Sig  . acetaminophen (TYLENOL) 325 MG tablet Take 2 tablets (650 mg total) by mouth every 6 (six) hours as needed for mild pain (or Fever >/= 101).  Marland Kitchen amLODipine (NORVASC) 5 MG tablet TAKE 1 TABLET BY MOUTH DAILY BEFORE BREAKFAST  . aspirin EC 81 MG tablet Take 81 mg by mouth daily.  . Calcium Carb-Cholecalciferol (CALCIUM 600-D PO) Take 1 tablet by mouth daily.  . Cholecalciferol (VITAMIN D-3) 5000 units TABS Take 5,000 Units by mouth daily.  . DULoxetine (CYMBALTA) 60 MG capsule TAKE 2 CAPSULES BY MOUTH DAILY BEFORE BREAKFAST  . gabapentin (NEURONTIN) 300 MG capsule TAKE 3 CAPSULES BY MOUTH 3 TIMES DAILY  . losartan-hydrochlorothiazide (HYZAAR) 100-25 MG tablet TAKE 1 TABLET BY MOUTH DAILY BEFORE BREAKFAST  . niacin (NIASPAN) 500 MG CR tablet Take 1 tablet (500 mg total) by mouth at bedtime.  Marland Kitchen nystatin (MYCOSTATIN) 100000 UNIT/ML suspension Take 5 mLs (500,000 Units total) by mouth 4 (four) times daily. Swish for 30 seconds and spit out.  Marland Kitchen omega-3 acid ethyl esters (LOVAZA) 1 g capsule Take 2 capsules  nightly before bed  . oxybutynin (DITROPAN-XL) 10 MG 24 hr tablet Take 1 tablet (10 mg total) by mouth at bedtime.  . simvastatin (ZOCOR) 10 MG tablet TAKE ONE (1) TABLET BY MOUTH AT BEDTIME  . traZODone (DESYREL) 100 MG tablet TAKE 1 TABLET BY MOUTH EVERY NIGHT AT BEDTIME    Allergies:  Allergies  Allergen Reactions  . Avelox [Moxifloxacin Hcl In Nacl] Other (See Comments)    CHEST PAIN OF UNSPECIFIED ORIGIN  . Cephalosporins Hives and Itching  . Other Itching and Swelling    UNSPECIFIED INSECT STINGS     Review of Systems: General:   Denies fever, chills,  unexplained weight loss.  Optho/Auditory:   Denies visual changes, blurred vision/LOV Respiratory:   Denies wheeze, DOE more than baseline levels.  Cardiovascular:   Denies chest pain, palpitations, new onset peripheral edema  Gastrointestinal:   Denies nausea, vomiting, diarrhea, abd pain.  Genitourinary: Denies dysuria, freq/ urgency, flank pain or discharge from genitals.  Endocrine:     Denies hot or cold intolerance, polyuria, polydipsia. Musculoskeletal:   Denies unexplained myalgias, joint swelling, unexplained arthralgias, gait problems.  Skin:  Denies new onset rash, suspicious lesions Neurological:     Denies dizziness, unexplained weakness, numbness  Psychiatric/Behavioral:   Denies mood changes, suicidal or homicidal ideations, hallucinations    Objective:   Blood pressure 138/86, pulse 90, weight 219 lb 3.2 oz (99.4 kg), SpO2 97 %. Body mass index is 35.38 kg/m. General:  Well Developed, well nourished, appropriate for stated age.  Neuro:  Alert and oriented,  extra-ocular muscles intact  HEENT:  Normocephalic, atraumatic, neck supple Skin:  no gross rash, warm, pink. Cardiac:  RRR, S1 S2 Respiratory:  ECTA B/L and A/P, Not using accessory muscles, speaking in full sentences- unlabored. Vascular:  Ext warm, no cyanosis apprec.; cap RF less 2 sec. Psych:  No HI/SI, judgement and insight good, Euthymic mood.  Full Affect.

## 2017-07-23 ENCOUNTER — Other Ambulatory Visit: Payer: Self-pay | Admitting: Family Medicine

## 2017-08-08 DIAGNOSIS — L820 Inflamed seborrheic keratosis: Secondary | ICD-10-CM | POA: Diagnosis not present

## 2017-08-08 DIAGNOSIS — L570 Actinic keratosis: Secondary | ICD-10-CM | POA: Diagnosis not present

## 2017-08-08 DIAGNOSIS — B078 Other viral warts: Secondary | ICD-10-CM | POA: Diagnosis not present

## 2017-08-08 DIAGNOSIS — X32XXXD Exposure to sunlight, subsequent encounter: Secondary | ICD-10-CM | POA: Diagnosis not present

## 2017-08-14 ENCOUNTER — Ambulatory Visit: Payer: PPO | Admitting: Family Medicine

## 2017-08-28 ENCOUNTER — Other Ambulatory Visit: Payer: Self-pay | Admitting: Family Medicine

## 2017-09-11 ENCOUNTER — Ambulatory Visit (INDEPENDENT_AMBULATORY_CARE_PROVIDER_SITE_OTHER): Payer: PPO | Admitting: Family Medicine

## 2017-09-11 ENCOUNTER — Other Ambulatory Visit: Payer: Self-pay | Admitting: Family Medicine

## 2017-09-11 ENCOUNTER — Encounter: Payer: Self-pay | Admitting: Family Medicine

## 2017-09-11 VITALS — BP 139/82 | HR 93 | Ht 66.0 in | Wt 223.0 lb

## 2017-09-11 DIAGNOSIS — E781 Pure hyperglyceridemia: Secondary | ICD-10-CM

## 2017-09-11 DIAGNOSIS — I10 Essential (primary) hypertension: Secondary | ICD-10-CM

## 2017-09-11 DIAGNOSIS — M48061 Spinal stenosis, lumbar region without neurogenic claudication: Secondary | ICD-10-CM | POA: Diagnosis not present

## 2017-09-11 DIAGNOSIS — F419 Anxiety disorder, unspecified: Secondary | ICD-10-CM | POA: Diagnosis not present

## 2017-09-11 DIAGNOSIS — R0989 Other specified symptoms and signs involving the circulatory and respiratory systems: Secondary | ICD-10-CM | POA: Diagnosis not present

## 2017-09-11 DIAGNOSIS — E782 Mixed hyperlipidemia: Secondary | ICD-10-CM

## 2017-09-11 NOTE — Patient Instructions (Signed)
Please follow-up in the office around end of October early November for full fasting blood work and then office visit with me a couple of days later.  Guidelines for a Low Cholesterol, Low Saturated Fat Diet   Fats - Limit total intake of fats and oils. - Avoid butter, stick margarine, shortening, lard, palm and coconut oils. - Limit mayonnaise, salad dressings, gravies and sauces, unless they are homemade with low-fat ingredients. - Limit chocolate. - Choose low-fat and nonfat products, such as low-fat mayonnaise, low-fat or non-hydrogenated peanut butter, low-fat or fat-free salad dressings and nonfat gravy. - Use vegetable oil, such as canola or olive oil. - Look for margarine that does not contain trans fatty acids. - Use nuts in moderate amounts. - Read ingredient labels carefully to determine both amount and type of fat present in foods. Limit saturated and trans fats! - Avoid high-fat processed and convenience foods.  Meats and Meat Alternatives - Choose fish, chicken, [place] and lean meats. - Use dried beans, peas, lentils and tofu. - Limit egg yolks to three to four per week. - If you eat red meat, limit to no more than three servings per week and choose loin or round cuts. - Avoid fatty meats, such as bacon, sausage, franks, luncheon meats and ribs. - Avoid all organ meats, including liver.  Dairy - Choose nonfat or low-fat milk, yogurt and cottage cheese. - Most cheeses are high in fat. Choose cheeses made from non-fat milk, such as mozzarella and ricotta cheese. - Choose light or fat-free cream cheese and sour cream. - Avoid cream and sauces made with cream.  Fruits and Vegetables - Eat a wide variety of fruits and vegetables. - Use lemon juice, vinegar or "mist" olive oil on vegetables. - Avoid adding sauces, fat or oil to vegetables.  Breads, Cereals and Grains - Choose whole-grain breads, cereals, pastas and rice. - Avoid high-fat snack foods, such as granola,  cookies, pies, pastries, doughnuts and croissants.  Cooking Tips - Avoid deep fried foods. - Trim visible fat off meats and remove skin from poultry before cooking. - Bake, broil, boil, poach or roast poultry, fish and lean meats. - Drain and discard fat that drains out of meat as you cook it. - Add little or no fat to foods. - Use vegetable oil sprays to grease pans for cooking or baking. - Steam vegetables. - Use herbs or no-oil marinades to flavor foods.

## 2017-09-11 NOTE — Progress Notes (Signed)
Impression and Recommendations:    1. Bruit of left carotid artery   2. Essential hypertension, benign   3. Mixed hyperlipidemia   4. Hypertriglyceridemia   5. Morbid obesity ([place])   6. Spinal stenosis of lumbar region, unspecified whether neurogenic claudication present   7. Anxiety     1. Slight Left Carotid Bruit:  -Discussed with the patient today regarding the evaluation process for checking the carotid bruits.   -At this time, the patient would like to follow up for an [place] of her carotids to further evaluate this, I will set up a referral for the patient today.   -Patient doesn't have a cardiologist at this time, I will refer the patient to cardiologist should the [place] be concerning for cardiac stenosis.   2. HTN Blood pressure at 170/86 today. Recheck showed blood pressure at 139/82.  -Advised the patient to continue walking and exercising at [facility] weekly.   3. Mood -Stable. Patient advised to continue on her medication as prescribed.   4. Lumbar Spinal Stenosis and Left hip Pain -Controlled on Rx Gabapentin. She will follow up with Dr. Lynann Bologna regarding her left hip pain.   5. HLD:  -Will order labs   -She hasn't taken her Rx Simvastatin for a period in the past, she started back taking her Simvastatin on 08/28/2017  -Follow up end of October 2019 for fasting labs with OV a couple days later.    Education and routine counseling performed. Handouts provided.  Orders Placed This Encounter  Procedures  . US Carotid Duplex Bilateral    No orders of the defined types were placed in this encounter.   There are no discontinued medications.   The patient was counseled, risk factors were discussed, anticipatory guidance given.  Gross side effects, risk and benefits, and alternatives of medications discussed with patient.  Patient is aware that all medications have potential side effects and we are unable to predict every side effect or drug-drug interaction that  may occur.  Expresses verbal understanding and consents to current therapy plan and treatment regimen.  Return for Chronic OV w me late October to early November & FBW 2-3d prior.  Please see AVS handed out to patient at the end of our visit for further patient instructions/ counseling done pertaining to today's office visit.    Note:  This document was prepared using Dragon voice recognition software and may include unintentional dictation errors.  This document serves as a record of services personally performed by Mellody Dance, DO. It was created on her behalf by Steva Colder, a trained medical scribe. The creation of this record is based on the scribe's personal observations and the provider's statements to them.   I have reviewed the above medical documentation for accuracy and completeness and I concur.  Mellody Dance 09/24/17 5:10 PM    Subjective:    HPI: Shelly Hickman is a 77 y.o. female who presents to [place] at [facility] today for follow up for HTN. She is doing well overall.    Health Maintenance:  She notes that she is having to use a magnifying glass to help with reading lately and she will make an appointment with her eye doctor.   She notes that following her recent personal goal that she made, she was unable to meet her goal and she would like to improve on that. She has been working out at [employer] once weekly, which she has enjoyed.  She started making a journal, however, she was unable to keep it up. She did note that she found that she consumed too many milk products and she had a only 2 episodes of diarrhea recently.   She went to a Dermatologist recently and she will be following up. She denies having a cardiologist.   HTN:  -  Her blood pressure has not been controlled at home. She hasn't been checking her blood pressure at home with her cuffs. She will bring them into the office to be calibrated.   - Patient reports good  compliance with blood pressure medications  - Denies medication S-E   - Smoking Status noted   - She denies new onset of: chest pain, exercise intolerance, shortness of breath, DOE, dizziness, visual changes, headache, lower extremity swelling or claudication.    Last 3 blood pressure readings in our office are as follows: BP Readings from Last 3 Encounters:  09/11/17 139/82  06/24/17 138/86  05/01/17 101/68    Pulse Readings from Last 3 Encounters:  09/11/17 93  06/24/17 90  05/01/17 78    Filed Weights   09/11/17 1319  Weight: 223 lb (101.2 kg)      Patient Care Team    Relationship Specialty Notifications Start End  Mellody Dance, DO PCP - General Family Medicine  08/30/15   Almedia Balls, MD Consulting Physician Orthopedic Surgery  10/18/15   Kristeen Miss, MD Consulting Physician Neurosurgery  10/18/15    Comment: spine sx  Allyn Kenner, MD Consulting Physician Dermatology  10/18/15    Comment: skin screening   Elsie Saas, MD Consulting Physician Orthopedic Surgery  02/20/16   [facility] [place], MD Attending Physician   10/17/16      Lab Results  Component Value Date   CREATININE 0.85 10/04/2016   BUN 20 10/04/2016   NA 137 10/04/2016   K 4.3 10/04/2016   CL 97 10/04/2016   CO2 25 10/04/2016    Lab Results  Component Value Date   CHOL 185 10/04/2016   CHOL 294 (H) 08/31/2015   CHOL 189 12/21/2014    Lab Results  Component Value Date   HDL 43 10/04/2016   HDL 35 (L) 08/31/2015   HDL 45 12/21/2014    Lab Results  Component Value Date   LDLCALC 111 (H) 10/04/2016   LDLCALC 224 (H) 08/31/2015   LDLCALC 117 12/21/2014    Lab Results  Component Value Date   TRIG 153 (H) 10/04/2016   TRIG 175 (H) 08/31/2015   TRIG 134 12/21/2014    Lab Results  Component Value Date   CHOLHDL 4.3 10/04/2016   CHOLHDL 8.4 (H) 08/31/2015    No results found for:  LDLDIRECT ===================================================================   Patient Active Problem List   Diagnosis Date Noted  . Bruit of left carotid artery 09/11/2017    Priority: High  . Morbid obesity ([place]) 09/09/2015    Priority: High  . Prediabetes 09/06/2015    Priority: High  . Hypertriglyceridemia 09/06/2015    Priority: High  . HLD (hyperlipidemia) 08/30/2015    Priority: High  . Essential hypertension, benign 07/14/2013    Priority: High  . Anxiety 07/14/2013    Priority: High  . Insomnia 07/14/2013    Priority: High  . Depression 03/26/2011    Priority: High  . Low HDL (under 40) 09/06/2015    Priority: Medium  . Incontinence of urine 09/05/2013    Priority: Medium  . GERD (gastroesophageal reflux disease) 12/10/2012  Priority: Medium  . Spinal stenosis of lumbar region 10/18/2015    Priority: Low  . 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 Irritable bowel syndrome with both constipation and diarrhea 06/24/2017  . Claw toe, acquired, left 05/29/2017  . Thrush, oral 05/01/2017  . Morton neuroma, left 01/20/2017  . Pain in left foot 01/20/2017  . Sialoadenitis- submandibular gland 12/10/2016  . Pre-op evaluation 05/30/2016  . 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 Encounter for wellness examination 12/31/2015  . Primary localized osteoarthritis of right knee 12/12/2015  . Spinal stenosis in cervical region 10/18/2015  . Memory impairment 08/30/2015  . Vitamin D deficiency 08/30/2015  . Status post left hip replacement 07/14/2013  . Constipation 07/14/2013  . Allergic rhinitis 07/14/2013  . Osteopenia 06/27/2011  . Lacrimal canalicular stenosis 43/15/4008  . Degenerative joint disease involving multiple joints 04/06/2010     Past Medical History:  Diagnosis Date  . Anemia   . Anxiety   . Arthritis   . Depression   . H/O hiatal hernia   . Hypercholesterolemia   . Hypertension   . Incontinence of urine   . Neuromuscular disorder  ([place])   . Osteoarthritis      Past Surgical History:  Procedure Laterality Date  . 2 Synovial Cysts removed between L4-L5    . ABDOMINAL HYSTERECTOMY    . BACK SURGERY     back fusion after 2 back surgeries   . CARPAL TUNNEL RELEASE    . COLONOSCOPY    . EYE SURGERY     tube inserted for excessive tearing , tube is removed   . Right Knee Arthroscopy    . SALPINGOOPHORECTOMY  1993  . TONSILLECTOMY    . 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   Procedure: TOTAL HIP ARTHROPLASTY;  Surgeon: Kerin Salen, MD;  Location: [facility];  Service: Orthopedics;  Laterality: Left;  . TOTAL KNEE ARTHROPLASTY Right 03/04/2016   Procedure: TOTAL KNEE ARTHROPLASTY;  Surgeon: Elsie Saas, MD;  Location: [facility];  Service: Orthopedics;  Laterality: Right;  . WISDOM TOOTH EXTRACTION       Family History  Problem Relation Age of Onset  . Hypertension Mother   . Diabetes Mother   . Stroke Mother   . Alcoholism Father   . Alcohol abuse Father   . Depression Sister      Social History   Substance and Sexual Activity  Drug Use No  ,  Social History   Substance and Sexual Activity  Alcohol Use No  ,  Social History   Tobacco Use  Smoking Status Former Smoker  . Types: Cigarettes  . Last attempt to quit: 02/18/1965  . Years since quitting: 52.6  Smokeless Tobacco Never Used  ,    Current Outpatient Medications on File Prior to Visit  Medication Sig Dispense Refill  . acetaminophen (TYLENOL) 325 MG tablet Take 2 tablets (650 mg total) by mouth every 6 (six) hours as needed for mild pain (or Fever >/= 101).    Marland Kitchen aspirin EC 81 MG tablet Take 81 mg by mouth daily.    . Calcium Carb-Cholecalciferol (CALCIUM 600-D PO) Take 1 tablet by mouth daily.    . Cholecalciferol (VITAMIN D-3) 5000 units TABS Take 5,000 Units by mouth daily.    Marland Kitchen gabapentin (NEURONTIN) 300 MG capsule TAKE 3 CAPSULES BY MOUTH 3 TIMES DAILY 270 capsule 2  . losartan-hydrochlorothiazide (HYZAAR) 100-25 MG tablet TAKE 1  TABLET BY MOUTH DAILY BEFORE BREAKFAST 90 tablet 1  .  niacin (NIASPAN) 500 MG CR tablet Take 1 tablet (500 mg total) by mouth at bedtime. 90 tablet 3  . nystatin (MYCOSTATIN) 100000 UNIT/ML suspension Take 5 mLs (500,000 Units total) by mouth 4 (four) times daily. Swish for 30 seconds and spit out. 180 mL 0  . omega-3 acid ethyl esters (LOVAZA) 1 g capsule Take 2 capsules nightly before bed 180 capsule 3  . oxybutynin (DITROPAN-XL) 10 MG 24 hr tablet Take 1 tablet (10 mg total) by mouth at bedtime. 90 tablet 1  . simvastatin (ZOCOR) 10 MG tablet TAKE 1 TABLET BY MOUTH EACH NIGHT AT BEDTIME 90 tablet 1  . traZODone (DESYREL) 100 MG tablet TAKE 1 TABLET BY MOUTH EACH NIGHT AT BEDTIME 90 tablet 0   No current facility-administered medications on file prior to visit.      Allergies  Allergen Reactions  . Avelox [Moxifloxacin Hcl In Nacl] Other (See Comments)    CHEST PAIN OF UNSPECIFIED ORIGIN  . Cephalosporins Hives and Itching  . Other Itching and Swelling    UNSPECIFIED INSECT STINGS     Review of Systems:   General:  Denies fever, chills Optho/Auditory:   Denies visual changes, blurred vision Respiratory:   Denies SOB, cough, wheeze, DIB  Cardiovascular:   Denies chest pain, palpitations, painful respirations Gastrointestinal:   Denies nausea, vomiting, diarrhea.  Endocrine:     Denies new hot or cold intolerance Musculoskeletal:  Denies joint swelling, gait issues, or new unexplained myalgias/ arthralgias Skin:  Denies rash, suspicious lesions  Neurological:    Denies dizziness, unexplained weakness, numbness  Psychiatric/Behavioral:   Denies mood changes  Objective:    Blood pressure 139/82, pulse 93, height 5\' 6"  (1.676 m), weight 223 lb (101.2 kg), SpO2 98 %.  Body mass index is 35.99 kg/m.  General: Well Developed, well nourished, and in no acute distress.  HEENT: Normocephalic, atraumatic, pupils equal round reactive to light, neck supple, no JVD. (+) Small slight left  carotid bruit. No right carotid bruit.  Skin: Warm and dry, cap RF less 2 sec Cardiac: Regular rate and rhythm, S1, S2 WNL's, no murmurs rubs or gallops Respiratory: ECTA B/L, Not using accessory muscles, speaking in full sentences. NeuroM-Sk: Ambulates w/o assistance, moves ext * 4 w/o difficulty, sensation grossly intact.  Ext: scant edema b/l lower ext Psych: No HI/SI, judgement and insight good, Euthymic mood. Full Affect.

## 2017-09-12 DIAGNOSIS — B078 Other viral warts: Secondary | ICD-10-CM | POA: Diagnosis not present

## 2017-09-15 ENCOUNTER — Other Ambulatory Visit: Payer: Self-pay | Admitting: Family Medicine

## 2017-09-15 ENCOUNTER — Ambulatory Visit
Admission: RE | Admit: 2017-09-15 | Discharge: 2017-09-15 | Disposition: A | Discharge status: Home / Self Care | Payer: PPO | Source: Ambulatory Visit | Attending: Family Medicine | Admitting: Family Medicine

## 2017-09-15 DIAGNOSIS — I6523 Occlusion and stenosis of bilateral carotid arteries: Secondary | ICD-10-CM | POA: Diagnosis not present

## 2017-09-15 DIAGNOSIS — R0989 Other specified symptoms and signs involving the circulatory and respiratory systems: Secondary | ICD-10-CM

## 2017-09-16 ENCOUNTER — Telehealth: Payer: Self-pay | Admitting: Family Medicine

## 2017-09-16 NOTE — Telephone Encounter (Signed)
Patient called states she had a call back for the [facility] & patient wants to know if results from that one has been rcvd.  --Please call her with results.  Forwarding message to medical assistant.  --Dion Body

## 2017-09-16 NOTE — Telephone Encounter (Signed)
Results from US done yesterday are in the chart.  Please review and advise.  Thank you. MPulliam, CMA/RT(R)

## 2017-09-19 ENCOUNTER — Other Ambulatory Visit: Payer: Self-pay | Admitting: Family Medicine

## 2017-09-19 ENCOUNTER — Telehealth: Payer: Self-pay | Admitting: Family Medicine

## 2017-09-19 NOTE — Telephone Encounter (Signed)
Pt called for refill on : traZODone (DESYREL) 100 MG tablet [[id number]]   Order Details  Dose, Route, Frequency: As Directed   Dispense Quantity: 90 tablet Refills: 0 Fills remaining: --        Sig: TAKE 1 TABLET BY MOUTH EACH NIGHT AT BEDTIME     --Pt states is leaving to go out of town on Wednesday, Aug 7th & she needs the Rx .  Please send refill auth to: [facility] - [place], [place]. [phone] (Phone) [phone] (Fax)   ----Forwarding request to medical assistant.  --Dion Body

## 2017-09-22 ENCOUNTER — Other Ambulatory Visit: Payer: PPO

## 2017-09-22 DIAGNOSIS — M5136 Other intervertebral disc degeneration, lumbar region: Secondary | ICD-10-CM | POA: Diagnosis not present

## 2017-09-22 DIAGNOSIS — M25552 Pain in left hip: Secondary | ICD-10-CM | POA: Diagnosis not present

## 2017-09-22 DIAGNOSIS — M1712 Unilateral primary osteoarthritis, left knee: Secondary | ICD-10-CM | POA: Diagnosis not present

## 2017-09-22 NOTE — Telephone Encounter (Signed)
Called pharmacy and last RX was picked up on 07/23/2017 for #90.  Patient is not due for refill until 10/23/2017.   Called patient, she states that she is going to [place] and will out of town for 2 weeks and believes that she is almost out of meds.  Patient states that she is taking 1 tab at night.  Advised the patient that she would not be due for refill until 10/23/2017.  Patient will check how many she has at home and will call me back. MPulliam, CMA/RT(R)

## 2017-10-21 ENCOUNTER — Other Ambulatory Visit: Payer: Self-pay | Admitting: Family Medicine

## 2017-11-10 DIAGNOSIS — M1712 Unilateral primary osteoarthritis, left knee: Secondary | ICD-10-CM | POA: Diagnosis not present

## 2017-12-11 ENCOUNTER — Other Ambulatory Visit: Payer: Self-pay

## 2017-12-11 DIAGNOSIS — Z0000 Encounter for general adult medical examination without abnormal findings: Secondary | ICD-10-CM

## 2017-12-11 DIAGNOSIS — I10 Essential (primary) hypertension: Secondary | ICD-10-CM

## 2017-12-11 DIAGNOSIS — R7303 Prediabetes: Secondary | ICD-10-CM

## 2017-12-11 DIAGNOSIS — R413 Other amnesia: Secondary | ICD-10-CM

## 2017-12-11 DIAGNOSIS — E559 Vitamin D deficiency, unspecified: Secondary | ICD-10-CM

## 2017-12-11 DIAGNOSIS — E785 Hyperlipidemia, unspecified: Secondary | ICD-10-CM

## 2017-12-12 ENCOUNTER — Other Ambulatory Visit: Payer: PPO

## 2017-12-12 DIAGNOSIS — R7303 Prediabetes: Secondary | ICD-10-CM | POA: Diagnosis not present

## 2017-12-12 DIAGNOSIS — I10 Essential (primary) hypertension: Secondary | ICD-10-CM

## 2017-12-12 DIAGNOSIS — Z0000 Encounter for general adult medical examination without abnormal findings: Secondary | ICD-10-CM | POA: Diagnosis not present

## 2017-12-12 DIAGNOSIS — R413 Other amnesia: Secondary | ICD-10-CM | POA: Diagnosis not present

## 2017-12-12 DIAGNOSIS — E785 Hyperlipidemia, unspecified: Secondary | ICD-10-CM | POA: Diagnosis not present

## 2017-12-12 DIAGNOSIS — E559 Vitamin D deficiency, unspecified: Secondary | ICD-10-CM | POA: Diagnosis not present

## 2017-12-16 DIAGNOSIS — Z1231 Encounter for screening mammogram for malignant neoplasm of breast: Secondary | ICD-10-CM | POA: Diagnosis not present

## 2017-12-16 LAB — CBC WITH DIFFERENTIAL/PLATELET
Basophils Absolute: 0.1 10*3/uL (ref 0.0–0.2)
Basos: 1 %
EOS (ABSOLUTE): 0.6 10*3/uL — ABNORMAL HIGH (ref 0.0–0.4)
Eos: 7 %
Hematocrit: 42.5 % (ref 34.0–46.6)
Hemoglobin: 13.7 g/dL (ref 11.1–15.9)
Immature Grans (Abs): 0 10*3/uL (ref 0.0–0.1)
Immature Granulocytes: 0 %
Lymphocytes Absolute: 2.1 10*3/uL (ref 0.7–3.1)
Lymphs: 27 %
MCH: 28.1 pg (ref 26.6–33.0)
MCHC: 32.2 g/dL (ref 31.5–35.7)
MCV: 87 fL (ref 79–97)
Monocytes Absolute: 0.7 10*3/uL (ref 0.1–0.9)
Monocytes: 9 %
Neutrophils Absolute: 4.4 10*3/uL (ref 1.4–7.0)
Neutrophils: 56 %
Platelets: 367 10*3/uL (ref 150–450)
RBC: 4.87 x10E6/uL (ref 3.77–5.28)
RDW: 13.4 % (ref 12.3–15.4)
WBC: 7.8 10*3/uL (ref 3.4–10.8)

## 2017-12-16 LAB — COMPREHENSIVE METABOLIC PANEL
ALT: 18 IU/L (ref 0–32)
AST: 25 IU/L (ref 0–40)
Albumin/Globulin Ratio: 1.7 (ref 1.2–2.2)
Albumin: 4.3 g/dL (ref 3.5–4.8)
Alkaline Phosphatase: 66 IU/L (ref 39–117)
BUN/Creatinine Ratio: 20 (ref 12–28)
BUN: 18 mg/dL (ref 8–27)
Bilirubin Total: 0.5 mg/dL (ref 0.0–1.2)
CO2: 23 mmol/L (ref 20–29)
Calcium: 9.8 mg/dL (ref 8.7–10.3)
Chloride: 98 mmol/L (ref 96–106)
Creatinine, Ser: 0.88 mg/dL (ref 0.57–1.00)
GFR calc Af Amer: 74 mL/min/{1.73_m2} (ref >59)
GFR calc non Af Amer: 64 mL/min/{1.73_m2} (ref >59)
Globulin, Total: 2.6 g/dL (ref 1.5–4.5)
Glucose: 112 mg/dL — ABNORMAL HIGH (ref 65–99)
Potassium: 4 mmol/L (ref 3.5–5.2)
Sodium: 141 mmol/L (ref 134–144)
Total Protein: 6.9 g/dL (ref 6.0–8.5)

## 2017-12-16 LAB — HEMOGLOBIN A1C
Est. average glucose Bld gHb Est-mCnc: 128 mg/dL
Hgb A1c MFr Bld: 6.1 % — ABNORMAL HIGH (ref 4.8–5.6)

## 2017-12-16 LAB — TSH: TSH: 9.01 u[IU]/mL — ABNORMAL HIGH (ref 0.450–4.500)

## 2017-12-16 LAB — LIPID PANEL
Chol/HDL Ratio: 4.4 ratio (ref 0.0–4.4)
Cholesterol, Total: 188 mg/dL (ref 100–199)
HDL: 43 mg/dL (ref >39)
LDL Calculated: 110 mg/dL — ABNORMAL HIGH (ref 0–99)
Triglycerides: 174 mg/dL — ABNORMAL HIGH (ref 0–149)
VLDL Cholesterol Cal: 35 mg/dL (ref 5–40)

## 2017-12-16 LAB — T4, FREE: Free T4: 0.93 ng/dL (ref 0.82–1.77)

## 2017-12-16 LAB — VITAMIN D 25 HYDROXY (VIT D DEFICIENCY, FRACTURES): Vit D, 25-Hydroxy: 51.6 ng/mL (ref 30.0–100.0)

## 2017-12-16 LAB — HM MAMMOGRAPHY

## 2017-12-17 ENCOUNTER — Encounter: Payer: Self-pay | Admitting: Family Medicine

## 2017-12-17 ENCOUNTER — Telehealth: Payer: Self-pay | Admitting: Family Medicine

## 2017-12-17 ENCOUNTER — Ambulatory Visit (INDEPENDENT_AMBULATORY_CARE_PROVIDER_SITE_OTHER): Payer: PPO | Admitting: Family Medicine

## 2017-12-17 VITALS — BP 137/82 | HR 74 | Resp 18 | Ht 66.0 in | Wt 219.0 lb

## 2017-12-17 DIAGNOSIS — R7303 Prediabetes: Secondary | ICD-10-CM | POA: Diagnosis not present

## 2017-12-17 DIAGNOSIS — E039 Hypothyroidism, unspecified: Secondary | ICD-10-CM | POA: Diagnosis not present

## 2017-12-17 DIAGNOSIS — E782 Mixed hyperlipidemia: Secondary | ICD-10-CM

## 2017-12-17 DIAGNOSIS — I10 Essential (primary) hypertension: Secondary | ICD-10-CM

## 2017-12-17 DIAGNOSIS — R208 Other disturbances of skin sensation: Secondary | ICD-10-CM | POA: Diagnosis not present

## 2017-12-17 DIAGNOSIS — F329 Major depressive disorder, single episode, unspecified: Secondary | ICD-10-CM

## 2017-12-17 DIAGNOSIS — E781 Pure hyperglyceridemia: Secondary | ICD-10-CM

## 2017-12-17 DIAGNOSIS — E038 Other specified hypothyroidism: Secondary | ICD-10-CM

## 2017-12-17 DIAGNOSIS — F32A Depression, unspecified: Secondary | ICD-10-CM

## 2017-12-17 MED ORDER — LOSARTAN POTASSIUM-HCTZ 100-25 MG PO TABS: 1.0000 | ORAL_TABLET | Freq: Every day | ORAL | 3 refills | Status: DC

## 2017-12-17 MED ORDER — SIMVASTATIN 20 MG PO TABS: 20.0000 mg | ORAL_TABLET | Freq: Every evening | ORAL | 3 refills | Status: DC

## 2017-12-17 MED ORDER — DULOXETINE HCL 60 MG PO CPEP: ORAL_CAPSULE | ORAL | 3 refills | Status: DC

## 2017-12-17 MED ORDER — OMEGA-3-ACID ETHYL ESTERS 1 G PO CAPS: ORAL_CAPSULE | ORAL | 3 refills | Status: DC

## 2017-12-17 MED ORDER — LEVOTHYROXINE SODIUM 25 MCG PO TABS: 25.0000 ug | ORAL_TABLET | Freq: Every day | ORAL | 0 refills | Status: DC

## 2017-12-17 MED ORDER — GABAPENTIN 300 MG PO CAPS: ORAL_CAPSULE | ORAL | 3 refills | Status: DC

## 2017-12-17 NOTE — Telephone Encounter (Signed)
Shelly Hickman from [name] called needs provider or nurse to call her back Re :  losartan-hydrochlorothiazide (HYZAAR) 100-25 MG tablet [[id number]]   Order Details  Dose: 1 tablet Route: Oral Frequency: Daily before breakfast  Dispense Quantity: 90 tablet Refills: 3 Fills remaining: --        Sig: Take 1 tablet by mouth daily before breakfast.          -- per pharmacy : [facility], [place]. [phone] (Phone) [phone] (Fax)   --Forwarding message to medical assistant .  --Dion Body

## 2017-12-17 NOTE — Telephone Encounter (Signed)
Per Charlie at [facility], Hyzaar 100-25 in on back order.  They would like RXs sent for the two separate medications.  Please advise.  Charyl Bigger, CMA

## 2017-12-17 NOTE — Progress Notes (Signed)
Impression and Recommendations:    1. Subclinical hypothyroidism-with symptoms of fatigue, hair loss etc.   2. Essential hypertension, benign   3. Hypertriglyceridemia   4. Mixed hyperlipidemia   5. Depression, unspecified depression type   6. Prediabetes-worsening   7. Burning sensation of lower and upper extremity     Hypothyroidism -Prescribed synthroid; see med list -Explained that high TSH indicates that the brain is telling the thyroid to produce more hormones -Explained how TSH and T4 interact to help control the thyroid production -Explained SX of hypothyroidism  -instructed to return in 6-8 weeks to recheck thyroid levels   HLD/hypertriglyceridemia -Increased zocor; see med list -Explained that LDL is the mostly highly linked to risk of heart attack and stroke in pts -Explained what kinds of foods negatively impact LDL and triglycerides -Explained what factors go into the ASCVD risk and how it determines treatment -Explained how family history impacts cholesterol levels -Explained that pts can see increased SE with cholesterol medications when they are dehydrated or don't exercise enough -Explained the need for liver enzyme tests in 6-8 weeks with increasing cholesterol medications  The 10-year ASCVD risk score Mikey Bussing DC Jr., et al., 2013) is: 23.5%   Values used to calculate the score:     Age: 18 years     Sex: Female     Is Non-Hispanic African American: No     Diabetic: No     Tobacco smoker: No     Systolic Blood Pressure: 944 mmHg     Is BP treated: Yes     HDL Cholesterol: 43 mg/dL     Total Cholesterol: 188 mg/dL   Prediabetes -Discussed A1C levels for prediabetes and diabetes -Explained that DM requires more pharmaceutical management for DM and for HTN and HLD  -Explained that the best way to control sugars is to lose weight and change her diet -Pt to return for testing in 4-6 months -Explained how high fasting glucose also exhibits  prediabetes   Burning extremities -Offered medication and referral to neurology; pt declined -Explained to pt that it is likely nerves -explained that it could possibly also be an issue with circulation, but it isn't likely since she isn't in a weird position -Explained that burning pain is likely nerves  -Educated pt that she may need a nerve conduction test if sx increase in frequency -Instructed pt to test her sensation and capillary refill on her feet and hands next time she experiences the symptoms -encouraged pt to contact us if sx become more severe or increase in frequency   Lab Results -Reviewed goal ranges for vitamin D and reviewed results -Told pt that we will research how artifical sunlight affects Vitamin D production -Reviewed BUN, Ser/Creatinine, and GFR levels -Encouraged pt to stop using nephrotoxic medications like advil and aleve -Reviewed calcium and vitamin D's interaction in the body -Reviewed ALT and AST and their indications in liver health -reviewed CBC results and what they indicate about blood health   Education and routine counseling performed. Handouts provided.   Medications Discontinued During This Encounter  Medication Reason  . niacin (NIASPAN) 500 MG CR tablet Completed Course  . DULoxetine (CYMBALTA) 60 MG capsule Reorder  . gabapentin (NEURONTIN) 300 MG capsule Reorder  . losartan-hydrochlorothiazide (HYZAAR) 100-25 MG tablet Reorder  . omega-3 acid ethyl esters (LOVAZA) 1 g capsule   . simvastatin (ZOCOR) 10 MG tablet Reorder     Orders Placed This Encounter  Procedures  . ALT  .  TSH  . T4, free  . T3, free  . Hemoglobin A1c    Meds ordered this encounter  Medications  . DULoxetine (CYMBALTA) 60 MG capsule    Sig: TAKE 2 CAPSULES BY MOUTH DAILY BEFORE BREAKFAST    Dispense:  180 capsule    Refill:  3  . gabapentin (NEURONTIN) 300 MG capsule    Sig: TAKE 3 CAPSULES BY MOUTH 3 TIMES DAILY    Dispense:  270 capsule    Refill:  3   . DISCONTD: losartan-hydrochlorothiazide (HYZAAR) 100-25 MG tablet    Sig: Take 1 tablet by mouth daily before breakfast.    Dispense:  90 tablet    Refill:  3  . omega-3 acid ethyl esters (LOVAZA) 1 g capsule    Sig: 2 caps twice daily    Dispense:  360 capsule    Refill:  3  . levothyroxine (SYNTHROID, LEVOTHROID) 25 MCG tablet    Sig: Take 1 tablet (25 mcg total) by mouth daily.    Dispense:  90 tablet    Refill:  0  . simvastatin (ZOCOR) 20 MG tablet    Sig: Take 1 tablet (20 mg total) by mouth Nightly.    Dispense:  90 tablet    Refill:  3   The patient was counseled, risk factors were discussed, anticipatory guidance given.  Gross side effects, risk and benefits, and alternatives of medications discussed with patient.  Patient is aware that all medications have potential side effects and we are unable to predict every side effect or drug-drug interaction that may occur.  Expresses verbal understanding and consents to current therapy plan and treatment regimen.  Return for 6-8-week lab only, OV with me 4 months-A1c, FLP, HTN, WT.  Please see AVS handed out to patient at the end of our visit for further patient instructions/ counseling done pertaining to today's office visit.    Note:  This document was prepared using Dragon voice recognition software and may include unintentional dictation errors.  This document serves as a record of services personally performed by Mellody Dance, MD. It was created on her behalf by Georga Bora, a trained medical scribe. The creation of this record is based on the scribe's personal observations and the provider's statements to them.   I have reviewed the above medical documentation for accuracy and completeness and I concur.  Mellody Dance, D.O.       Subjective:    HPI: Shelly Hickman is a 77 y.o. female who presents to [facility] today for follow up of abnormal lab results  Lab Results  12/16/2017 Thyroid -TSH 9.010 -T4 .0.93 -Pt states she has been struggling with fatigue especially, and has been trying to increase her energy  Vitamin D =51 -Pt states she finally got back her vitamin D supplements and she believes that is why its up -Pt asked if artificial light helps stimulate vitamin D  Cholesterol -LDL 110 -HDL 43 -triglycerides 174 -Pt states she is taking loveza -Pt states she has a lot of family history with HLD -Pt is not walking or exercising much right now   CBC WBC 7.8 RBC 4.87 Platelets 367  Ankle pain -States she's woken up at night with her ankles and wrists "on fire and hot" -States the sensation has caused her to wake up at night -States she goes in her computer room with a fan on until it cools off  -States she hasn't been sleeping strangely at night -  Says her husband had the same sx and she hadn't told him about them beforehand   Patient Care Team    Relationship Specialty Notifications Start End  Mellody Dance, DO PCP - General Family Medicine  08/30/15   Almedia Balls, MD Consulting Physician Orthopedic Surgery  10/18/15   Kristeen Miss, MD Consulting Physician Neurosurgery  10/18/15    Comment: spine sx  Allyn Kenner, MD Consulting Physician Dermatology  10/18/15    Comment: skin screening   Elsie Saas, MD Consulting Physician Orthopedic Surgery  02/20/16   [facility] [place], MD Attending Physician   10/17/16      Lab Results  Component Value Date   CREATININE 0.88 12/12/2017   BUN 18 12/12/2017   NA 141 12/12/2017   K 4.0 12/12/2017   CL 98 12/12/2017   CO2 23 12/12/2017    Lab Results  Component Value Date   CHOL 188 12/12/2017   CHOL 185 10/04/2016   CHOL 294 (H) 08/31/2015    Lab Results  Component Value Date   HDL 43 12/12/2017   HDL 43 10/04/2016   HDL 35 (L) 08/31/2015    Lab Results  Component Value Date   LDLCALC 110 (H) 12/12/2017   LDLCALC 111 (H) 10/04/2016   LDLCALC 224 (H) 08/31/2015     Lab Results  Component Value Date   TRIG 174 (H) 12/12/2017   TRIG 153 (H) 10/04/2016   TRIG 175 (H) 08/31/2015    Lab Results  Component Value Date   CHOLHDL 4.4 12/12/2017   CHOLHDL 4.3 10/04/2016   CHOLHDL 8.4 (H) 08/31/2015    No results found for: LDLDIRECT ===================================================================   Patient Active Problem List   Diagnosis Date Noted  . Bruit of left carotid artery 09/11/2017    Priority: High  . Morbid obesity ([place]) 09/09/2015    Priority: High  . Prediabetes 09/06/2015    Priority: High  . Hypertriglyceridemia 09/06/2015    Priority: High  . HLD (hyperlipidemia) 08/30/2015    Priority: High  . Essential hypertension, benign 07/14/2013    Priority: High  . Anxiety 07/14/2013    Priority: High  . Insomnia 07/14/2013    Priority: High  . Depression 03/26/2011    Priority: High  . Low HDL (under 40) 09/06/2015    Priority: Medium  . Incontinence of urine 09/05/2013    Priority: Medium  . GERD (gastroesophageal reflux disease) 12/10/2012    Priority: Medium  . Spinal stenosis of lumbar region 10/18/2015    Priority: Low  . Subclinical hypothyroidism-with symptoms of fatigue, hair loss etc. 12/17/2017  . Burning sensation of lower extremity 12/17/2017  . 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 Irritable bowel syndrome with both constipation and diarrhea 06/24/2017  . Claw toe, acquired, left 05/29/2017  . Thrush, oral 05/01/2017  . Morton neuroma, left 01/20/2017  . Pain in left foot 01/20/2017  . Sialoadenitis- submandibular gland 12/10/2016  . Pre-op evaluation 05/30/2016  . 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 Encounter for wellness examination 12/31/2015  . Primary localized osteoarthritis of right knee 12/12/2015  . Spinal stenosis in cervical region 10/18/2015  . Memory impairment 08/30/2015  . Vitamin D deficiency 08/30/2015  . Status post left hip replacement 07/14/2013  . Constipation  07/14/2013  . Allergic rhinitis 07/14/2013  . Osteopenia 06/27/2011  . Lacrimal canalicular stenosis 62/83/6629  . Degenerative joint disease involving multiple joints 04/06/2010     Past Medical History:  Diagnosis Date  . Anemia   .  Anxiety   . Arthritis   . Depression   . H/O hiatal hernia   . Hypercholesterolemia   . Hypertension   . Incontinence of urine   . Neuromuscular disorder ([place])   . Osteoarthritis      Past Surgical History:  Procedure Laterality Date  . 2 Synovial Cysts removed between L4-L5    . ABDOMINAL HYSTERECTOMY    . BACK SURGERY     back fusion after 2 back surgeries   . CARPAL TUNNEL RELEASE    . COLONOSCOPY    . EYE SURGERY     tube inserted for excessive tearing , tube is removed   . Right Knee Arthroscopy    . SALPINGOOPHORECTOMY  1993  . TONSILLECTOMY    . 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   Procedure: TOTAL HIP ARTHROPLASTY;  Surgeon: Kerin Salen, MD;  Location: [place];  Service: Orthopedics;  Laterality: Left;  . TOTAL KNEE ARTHROPLASTY Right 03/04/2016   Procedure: TOTAL KNEE ARTHROPLASTY;  Surgeon: Elsie Saas, MD;  Location: [place];  Service: Orthopedics;  Laterality: Right;  . WISDOM TOOTH EXTRACTION       Family History  Problem Relation Age of Onset  . Hypertension Mother   . Diabetes Mother   . Stroke Mother   . Alcoholism Father   . Alcohol abuse Father   . Depression Sister      Social History   Substance and Sexual Activity  Drug Use No  ,  Social History   Substance and Sexual Activity  Alcohol Use No  ,  Social History   Tobacco Use  Smoking Status Former Smoker  . Types: Cigarettes  . Last attempt to quit: 02/18/1965  . Years since quitting: 52.8  Smokeless Tobacco Never Used  ,    Current Outpatient Medications on File Prior to Visit  Medication Sig Dispense Refill  . acetaminophen (TYLENOL) 325 MG tablet Take 2 tablets (650 mg total) by mouth every 6 (six) hours as needed for mild pain (or  Fever >/= 101).    Marland Kitchen amLODipine (NORVASC) 5 MG tablet TAKE 1 TABLET BY MOUTH DAILY BEFORE BREAKFAST 90 tablet 1  . aspirin EC 81 MG tablet Take 81 mg by mouth daily.    . Calcium Carb-Cholecalciferol (CALCIUM 600-D PO) Take 1 tablet by mouth daily.    . Cholecalciferol (VITAMIN D-3) 5000 units TABS Take 5,000 Units by mouth daily.    Marland Kitchen nystatin (MYCOSTATIN) 100000 UNIT/ML suspension Take 5 mLs (500,000 Units total) by mouth 4 (four) times daily. Swish for 30 seconds and spit out. 180 mL 0  . oxybutynin (DITROPAN-XL) 10 MG 24 hr tablet Take 1 tablet (10 mg total) by mouth at bedtime. 90 tablet 1  . traZODone (DESYREL) 100 MG tablet TAKE 1 TABLET BY MOUTH AT BEDTIME 90 tablet 0   No current facility-administered medications on file prior to visit.      Allergies  Allergen Reactions  . Avelox [Moxifloxacin Hcl In Nacl] Other (See Comments)    CHEST PAIN OF UNSPECIFIED ORIGIN  . Cephalosporins Hives and Itching  . Other Itching and Swelling    UNSPECIFIED INSECT STINGS     Review of Systems:   General:  Denies fever, chills Optho/Auditory:   Denies visual changes, blurred vision Respiratory:   Denies SOB, cough, wheeze, DIB  Cardiovascular:   Denies chest pain, palpitations, painful respirations Gastrointestinal:   Denies nausea, vomiting, diarrhea.  Endocrine:     Denies new hot or cold intolerance Musculoskeletal:  Denies joint swelling, gait issues, or new unexplained myalgias/ arthralgias Skin:  Denies rash, suspicious lesions  Neurological:    Denies dizziness, unexplained weakness, numbness  Psychiatric/Behavioral:   Denies mood changes  Objective:    Blood pressure 137/82, pulse 74, resp. rate 18, height 5\' 6"  (1.676 m), weight 219 lb (99.3 kg), SpO2 94 %.  Body mass index is 35.35 kg/m.  General: Well Developed, well nourished, and in no acute distress.  HEENT: Normocephalic, atraumatic, pupils equal round reactive to light, neck supple, No carotid bruits, no JVD Skin:  Warm and dry, cap RF less 2 sec Cardiac: Regular rate and rhythm, S1, S2 WNL's, no murmurs rubs or gallops Respiratory: ECTA B/L, Not using accessory muscles, speaking in full sentences. NeuroM-Sk: Ambulates w/o assistance, moves ext * 4 w/o difficulty, sensation grossly intact.  Ext: scant edema b/l lower ext Psych: No HI/SI, judgement and insight good, Euthymic mood. Full Affect.

## 2017-12-17 NOTE — Telephone Encounter (Signed)
I recall we discussed this w/ Melissa last week that as long as medications are not being changed in dose- it is always ok for a change like this.  You do not need to check with [place] unless they can't fill same meds at same doses.  THANKS!  Ok to [employer]

## 2017-12-17 NOTE — Patient Instructions (Signed)
Please recall we are will be rechecking your TSH and other thyroid levels in 6 weeks to [redacted] weeks along with your ALT level since increasing your Zocor.  -New prescription for your omega-3 was sent into your pharmacy.  As well as your new thyroid medicine and increased Zocor prescription  Guidelines for a Low Cholesterol, Low Saturated Fat Diet   Fats - Limit total intake of fats and oils. - Avoid butter, stick margarine, shortening, lard, palm and coconut oils. - Limit mayonnaise, salad dressings, gravies and sauces, unless they are homemade with low-fat ingredients. - Limit chocolate. - Choose low-fat and nonfat products, such as low-fat mayonnaise, low-fat or non-hydrogenated peanut butter, low-fat or fat-free salad dressings and nonfat gravy. - Use vegetable oil, such as canola or olive oil. - Look for margarine that does not contain trans fatty acids. - Use nuts in moderate amounts. - Read ingredient labels carefully to determine both amount and type of fat present in foods. Limit saturated and trans fats! - Avoid high-fat processed and convenience foods.  Meats and Meat Alternatives - Choose fish, chicken, [place] and lean meats. - Use dried beans, peas, lentils and tofu. - Limit egg yolks to three to four per week. - If you eat red meat, limit to no more than three servings per week and choose loin or round cuts. - Avoid fatty meats, such as bacon, sausage, franks, luncheon meats and ribs. - Avoid all organ meats, including liver.  Dairy - Choose nonfat or low-fat milk, yogurt and cottage cheese. - Most cheeses are high in fat. Choose cheeses made from non-fat milk, such as mozzarella and ricotta cheese. - Choose light or fat-free cream cheese and sour cream. - Avoid cream and sauces made with cream.  Fruits and Vegetables - Eat a wide variety of fruits and vegetables. - Use lemon juice, vinegar or "mist" olive oil on vegetables. - Avoid adding sauces, fat or oil to  vegetables.  Breads, Cereals and Grains - Choose whole-grain breads, cereals, pastas and rice. - Avoid high-fat snack foods, such as granola, cookies, pies, pastries, doughnuts and croissants.  Cooking Tips - Avoid deep fried foods. - Trim visible fat off meats and remove skin from poultry before cooking. - Bake, broil, boil, poach or roast poultry, fish and lean meats. - Drain and discard fat that drains out of meat as you cook it. - Add little or no fat to foods. - Use vegetable oil sprays to grease pans for cooking or baking. - Steam vegetables. - Use herbs or no-oil marinades to flavor foods.

## 2017-12-18 MED ORDER — LOSARTAN POTASSIUM 100 MG PO TABS: 100.0000 mg | ORAL_TABLET | Freq: Every day | ORAL | 3 refills | Status: DC

## 2017-12-18 MED ORDER — HYDROCHLOROTHIAZIDE 25 MG PO TABS: 25.0000 mg | ORAL_TABLET | Freq: Every day | ORAL | 3 refills | Status: DC

## 2017-12-24 ENCOUNTER — Other Ambulatory Visit: Payer: Self-pay | Admitting: Pharmacist

## 2017-12-24 NOTE — Patient Outreach (Signed)
[facility]) Care Management  12/24/2017  Shelly Hickman [birth year] [id number]   Incoming call from [name] in response to the [facility] Medication Adherence Campaign. Speak with patient. HIPAA identifiers verified and verbal consent received.  Shelly Hickman reports that her simvastatin 10 mg was just switched to simvastatin 20 mg once daily by her PCP last week. Reports that she may have misses some doses of this medication in the past due to not picking up a refill in time. Counsel patient on the importance of medication adherence and lipid control. Patient verbalizes understanding. Reports that she is using a weekly pillbox to organize her medications.  Shelly Hickman expresses confusion about some other medications that she picked up from the pharmacy. Reports that she was only expecting a thyroid medication, but also received hydrochlorothiazide. Counsel patient on levothyroxine administration and importance of follow up lab work. Note that this appointment has been made and patient confirms understanding.   Perform Epic chart review and note that losartan-hydrochlorothiazide was recently on back order and pharmacy received permission from provider to separate this prescription into a separate losartan 100 mg and hydrochlorothiazide 25 mg prescriptions. While on the phone, patient reviews her weekly pillbox to confirm that she is completely out of the losartan-hydrochlorothiazide prescription, so that she can make this switch, taking the separate losartan and hydrochlorothiazide prescriptions, without duplication.  Shelly Hickman denies any further medication questions/concerns at this time. She expresses appreciation for my help. Provide patient with my phone number.   Will close pharmacy episode at this time.  Harlow Asa, PharmD, [facility] [phone]

## 2018-01-23 ENCOUNTER — Other Ambulatory Visit: Payer: Self-pay

## 2018-01-23 DIAGNOSIS — F5102 Adjustment insomnia: Secondary | ICD-10-CM

## 2018-01-23 MED ORDER — TRAZODONE HCL 100 MG PO TABS: ORAL_TABLET | ORAL | 0 refills | Status: DC

## 2018-02-02 ENCOUNTER — Other Ambulatory Visit (INDEPENDENT_AMBULATORY_CARE_PROVIDER_SITE_OTHER): Payer: PPO

## 2018-02-02 DIAGNOSIS — E039 Hypothyroidism, unspecified: Secondary | ICD-10-CM

## 2018-02-02 DIAGNOSIS — R7303 Prediabetes: Secondary | ICD-10-CM | POA: Diagnosis not present

## 2018-02-02 DIAGNOSIS — E038 Other specified hypothyroidism: Secondary | ICD-10-CM

## 2018-02-02 DIAGNOSIS — E782 Mixed hyperlipidemia: Secondary | ICD-10-CM | POA: Diagnosis not present

## 2018-02-02 DIAGNOSIS — F329 Major depressive disorder, single episode, unspecified: Secondary | ICD-10-CM | POA: Diagnosis not present

## 2018-02-02 DIAGNOSIS — F32A Depression, unspecified: Secondary | ICD-10-CM

## 2018-02-02 DIAGNOSIS — E781 Pure hyperglyceridemia: Secondary | ICD-10-CM

## 2018-02-03 LAB — T3, FREE: T3, Free: 3 pg/mL (ref 2.0–4.4)

## 2018-02-03 LAB — HEMOGLOBIN A1C
Est. average glucose Bld gHb Est-mCnc: 126 mg/dL
Hgb A1c MFr Bld: 6 % — ABNORMAL HIGH (ref 4.8–5.6)

## 2018-02-03 LAB — ALT: ALT: 16 IU/L (ref 0–32)

## 2018-02-03 LAB — T4, FREE: Free T4: 0.94 ng/dL (ref 0.82–1.77)

## 2018-02-03 LAB — TSH: TSH: 5.53 u[IU]/mL — ABNORMAL HIGH (ref 0.450–4.500)

## 2018-03-16 ENCOUNTER — Other Ambulatory Visit: Payer: Self-pay | Admitting: Family Medicine

## 2018-03-16 DIAGNOSIS — E038 Other specified hypothyroidism: Secondary | ICD-10-CM

## 2018-03-16 DIAGNOSIS — E039 Hypothyroidism, unspecified: Secondary | ICD-10-CM

## 2018-03-23 ENCOUNTER — Other Ambulatory Visit: Payer: Self-pay | Admitting: Family Medicine

## 2018-03-23 DIAGNOSIS — I10 Essential (primary) hypertension: Secondary | ICD-10-CM

## 2018-04-13 DIAGNOSIS — X32XXXD Exposure to sunlight, subsequent encounter: Secondary | ICD-10-CM | POA: Diagnosis not present

## 2018-04-13 DIAGNOSIS — L304 Erythema intertrigo: Secondary | ICD-10-CM | POA: Diagnosis not present

## 2018-04-13 DIAGNOSIS — L820 Inflamed seborrheic keratosis: Secondary | ICD-10-CM | POA: Diagnosis not present

## 2018-04-13 DIAGNOSIS — L570 Actinic keratosis: Secondary | ICD-10-CM | POA: Diagnosis not present

## 2018-04-13 DIAGNOSIS — L258 Unspecified contact dermatitis due to other agents: Secondary | ICD-10-CM | POA: Diagnosis not present

## 2018-04-17 ENCOUNTER — Other Ambulatory Visit: Payer: Self-pay | Admitting: Family Medicine

## 2018-04-17 DIAGNOSIS — F5102 Adjustment insomnia: Secondary | ICD-10-CM

## 2018-04-20 ENCOUNTER — Ambulatory Visit: Payer: PPO | Admitting: Family Medicine

## 2018-04-22 ENCOUNTER — Encounter: Payer: Self-pay | Admitting: Family Medicine

## 2018-04-22 ENCOUNTER — Ambulatory Visit (INDEPENDENT_AMBULATORY_CARE_PROVIDER_SITE_OTHER): Payer: PPO | Admitting: Family Medicine

## 2018-04-22 VITALS — BP 116/74 | HR 77 | Temp 98.2°F | Ht 66.0 in | Wt 229.8 lb

## 2018-04-22 DIAGNOSIS — E782 Mixed hyperlipidemia: Secondary | ICD-10-CM

## 2018-04-22 DIAGNOSIS — M48061 Spinal stenosis, lumbar region without neurogenic claudication: Secondary | ICD-10-CM | POA: Diagnosis not present

## 2018-04-22 DIAGNOSIS — E038 Other specified hypothyroidism: Secondary | ICD-10-CM

## 2018-04-22 DIAGNOSIS — R221 Localized swelling, mass and lump, neck: Secondary | ICD-10-CM | POA: Insufficient documentation

## 2018-04-22 DIAGNOSIS — I10 Essential (primary) hypertension: Secondary | ICD-10-CM | POA: Diagnosis not present

## 2018-04-22 DIAGNOSIS — R7303 Prediabetes: Secondary | ICD-10-CM | POA: Diagnosis not present

## 2018-04-22 DIAGNOSIS — E039 Hypothyroidism, unspecified: Secondary | ICD-10-CM | POA: Diagnosis not present

## 2018-04-22 DIAGNOSIS — E781 Pure hyperglyceridemia: Secondary | ICD-10-CM

## 2018-04-22 DIAGNOSIS — F32A Depression, unspecified: Secondary | ICD-10-CM

## 2018-04-22 DIAGNOSIS — F329 Major depressive disorder, single episode, unspecified: Secondary | ICD-10-CM

## 2018-04-22 NOTE — Progress Notes (Signed)
Impression and Recommendations:    1. Neck enlargement- left lateral neck in supraclavicular region- unknkown cause   2. Essential hypertension, benign   3. Mixed hyperlipidemia   4. Hypertriglyceridemia   5. Depression, unspecified depression type   6. Spinal stenosis of lumbar region, unspecified whether neurogenic claudication present   7. Subclinical hypothyroidism-with symptoms of fatigue, hair loss etc.   8. Prediabetes-worsening     1. Enlargement, Lateral Neck - Left Supraclavicular Region - Ultrasound ordered today. - Will continue to monitor.  2. Prediabetes - Last A1c was 6.0 in December 2019, re-check mid-April 2020.  - Discussed prevention of disease and reviewed prudent dietary and lifestyle modifications as first line.  Importance of low carb/healthful diet discussed with patient in addition to regular exercise.   3. Essential Hypertension, Benign - Stable at this time, under good control. - Continue treatment plan and monitoring as prescribed. - Patient tolerating meds well without complication.  Denies S-E  - Will continue to monitor.  4. Mixed Hyperlipidemia, Hypertriglyceridemia - Stable last check. - Continue treatment as prescribed, on Zocor and Lovaza. - Tolerating medication without difficulties. - Will continue to monitor.  5. Depression - Stable at this time. - No acute complaints today.   - Continue Cymbalta as prescribed. - Will continue to monitor.  6. Subclinical Hypothyroidism - TSH, T3, T4 drawn today. - Patient denies sx. - Continue synthroid as prescribed. - Will continue to monitor.  7. Insomnia - managed on trazodone. - Will continue to monitor.  8. Spinal Stenosis - Neuropathic Pain - Discussed that patient can take gabapentin twice daily. - Continue treatment plan as prescribed.  - Will continue to monitor.  9. BMI Counseling - BMI of 37.09 Explained to patient what BMI refers to, and what it means medically.     Told patient to think about it as a "medical risk stratification measurement" and how increasing BMI is associated with increasing risk/ or worsening state of various diseases such as hypertension, hyperlipidemia, diabetes, premature OA, depression etc.  American Heart Association guidelines for healthy diet, basically Mediterranean diet, and exercise guidelines of 30 minutes 5 days per week or more discussed in detail.  Health counseling performed.  All questions answered.  10. Lifestyle & Preventative Health Maintenance - Advised patient to continue working toward exercising to improve overall mental, physical, and emotional health.    - Encouraged patient to engage in daily physical activity, especially a formal exercise routine.  Recommended that the patient eventually strive for at least 150 minutes of moderate cardiovascular activity per week according to guidelines established by the [facility].   - Healthy dietary habits encouraged, including low-carb, and high amounts of lean protein in diet.   - STRONGLY encouraged patient to consume adequate amounts of water.    Education and routine counseling performed. Handouts provided.    Orders Placed This Encounter  Procedures  . US SOFT TISSUE HEAD & NECK (NON-THYROID)  . TSH  . T4, free  . T3, free    No orders of the defined types were placed in this encounter.   Medications Discontinued During This Encounter  Medication Reason  . nystatin (MYCOSTATIN) 100000 UNIT/ML suspension Completed Course     The patient was counseled, risk factors were discussed, anticipatory guidance given.  Gross side effects, risk and benefits, and alternatives of medications discussed with patient.  Patient is aware that all medications have potential side effects and we are unable to predict every side effect or  drug-drug interaction that may occur.  Expresses verbal understanding and consents to current therapy plan and treatment regimen.  Return  for f/up 51monthfor Soft tissue swelling L neck; [place].  Please see AVS handed out to patient at the end of our visit for further patient instructions/ counseling done pertaining to today's office visit.    Note:  This document was prepared using Dragon voice recognition software and may include unintentional dictation errors.  This document serves as a record of services personally performed by DMellody Dance DO. It was created on her behalf by KToni Amend a trained medical scribe. The creation of this record is based on the scribe's personal observations and the provider's statements to them.   I have reviewed the above medical documentation for accuracy and completeness and I concur.  DMellody Dance DO 04/24/2018 2:52 PM       Subjective:    HPI: Shelly CORDONEis a 78y.o. female [facility]    Notes her prediabetes is not going well because Shelly Hickman feels Shelly Hickman's been "pigging out lately."    Sleep - Trazodone Use Notes Shelly Hickman sleeps well, "unless there's something really on my mind and I cannot stop thinking about it."  Hypothyroidism Continues on synthroid; denies fatigue or intolerances, denies concerns.  Recent Sprain in Neck & Inflammation Patient states Shelly Hickman had a crick in her neck and held a heating pad on the left side of her neck "all that day."  After this, Shelly Hickman began to notice acute inflammation.  Shelly Hickman spoke with a neighbor who is a retired cProducer, television/film/video and also saw Dr. [place]osaura Carpenter [place]  Notes her dentist recommended that Shelly Hickman visit an endocrinologist.  This prominence/puffiness in her left neck has been present since January.  Denies having anything like this before.  Denies history of neck or breast surgery.  [facility]. [facility], Shelly Hickman had something frozen off of her skin last week.  Neuropathic Pain Notes Shelly Hickman often forgets to take her gabapentin.   Confirms that Shelly Hickman feels her neuropathic pain if Shelly Hickman forgets to take the medicine.  Hyperlipidemia - Patient reports good compliance with medications or treatment plan.  Has been taking Lovaza and Zocor every night before bed.  Denies intolerances to meds.  - Denies medication S-E   - Smoking Status noted   - Shelly Hickman denies new onset of: chest pain, exercise intolerance, shortness of breath, dizziness, visual changes, headache, lower extremity swelling or claudication.   Denies myalgias.  The cholesterol last visit was:  Lab Results  Component Value Date   CHOL 188 12/12/2017   HDL 43 12/12/2017   LDLCALC 110 (H) 12/12/2017   TRIG 174 (H) 12/12/2017   CHOLHDL 4.4 12/12/2017    Hepatic Function Latest Ref Rng & Units 02/02/2018 12/12/2017 10/04/2016  Total Protein 6.0 - 8.5 g/dL - 6.9 6.7  Albumin 3.5 - 4.8 g/dL - 4.3 3.9  AST 0 - 40 IU/L - 25 20  ALT 0 - 32 IU/L 16 18 17   Alk Phosphatase 39 - 117 IU/L - 66 85  Total Bilirubin 0.0 - 1.2 mg/dL - 0.5 0.2    HTN:  -  Her blood pressure has been controlled at home.  Pt has been checking it regularly.  Has been controlled at home.  - Patient reports good compliance with blood pressure medications.  - Denies medication S-E   -  Smoking Status noted   - Shelly Hickman denies new onset of: chest pain, exercise intolerance, shortness of breath, dizziness, visual changes, headache, lower extremity swelling or claudication.   Last 3 blood pressure readings in our office are as follows: BP Readings from Last 3 Encounters:  04/22/18 116/74  12/17/17 137/82  09/11/17 139/82    Pulse Readings from Last 3 Encounters:  04/22/18 77  12/17/17 74  09/11/17 93    Filed Weights   04/22/18 1354  Weight: 229 lb 12.8 oz (104.2 kg)      Patient Care Team    Relationship Specialty Notifications Start End  Mellody Dance, DO PCP - General Family Medicine  08/30/15   Almedia Balls, MD Consulting Physician Orthopedic Surgery  10/18/15   Kristeen Miss, MD Consulting Physician Neurosurgery  10/18/15    Comment: spine sx  Allyn Kenner, MD Consulting Physician Dermatology  10/18/15    Comment: skin screening   Elsie Saas, MD Consulting Physician Orthopedic Surgery  02/20/16   [facility] [place], MD Attending Physician   10/17/16      Lab Results  Component Value Date   CREATININE 0.88 12/12/2017   BUN 18 12/12/2017   NA 141 12/12/2017   K 4.0 12/12/2017   CL 98 12/12/2017   CO2 23 12/12/2017    Lab Results  Component Value Date   CHOL 188 12/12/2017   CHOL 185 10/04/2016   CHOL 294 (H) 08/31/2015    Lab Results  Component Value Date   HDL 43 12/12/2017   HDL 43 10/04/2016   HDL 35 (L) 08/31/2015    Lab Results  Component Value Date   LDLCALC 110 (H) 12/12/2017   LDLCALC 111 (H) 10/04/2016   LDLCALC 224 (H) 08/31/2015    Lab Results  Component Value Date   TRIG 174 (H) 12/12/2017   TRIG 153 (H) 10/04/2016   TRIG 175 (H) 08/31/2015    Lab Results  Component Value Date   CHOLHDL 4.4 12/12/2017   CHOLHDL 4.3 10/04/2016   CHOLHDL 8.4 (H) 08/31/2015    No results found for: LDLDIRECT ===================================================================   Patient Active Problem List   Diagnosis Date Noted  . Neck enlargement- left lateral neck in supraclavicular region- unknkown cause 04/22/2018  . Subclinical hypothyroidism-with symptoms of fatigue, hair loss etc. 12/17/2017  . Burning sensation of lower extremity 12/17/2017  . Bruit of left carotid artery 09/11/2017  . 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 Irritable bowel syndrome with both constipation and diarrhea 06/24/2017  . Claw toe, acquired, left 05/29/2017  . Thrush, oral 05/01/2017  . Morton neuroma, left 01/20/2017  . Pain in left foot 01/20/2017  . Sialoadenitis- submandibular gland 12/10/2016  . Pre-op evaluation 05/30/2016  . 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 Encounter for wellness examination 12/31/2015  .  Primary localized osteoarthritis of right knee 12/12/2015  . Spinal stenosis in cervical region 10/18/2015  . Spinal stenosis of lumbar region 10/18/2015  . Morbid obesity ([place]) 09/09/2015  . Prediabetes 09/06/2015  . Low HDL (under 40) 09/06/2015  . Hypertriglyceridemia 09/06/2015  . HLD (hyperlipidemia) 08/30/2015  . Memory impairment 08/30/2015  . Vitamin D deficiency 08/30/2015  . Incontinence of urine 09/05/2013  . Status post left hip replacement 07/14/2013  . Essential hypertension, benign 07/14/2013  . Constipation 07/14/2013  . Allergic rhinitis 07/14/2013  . Anxiety 07/14/2013  . Insomnia 07/14/2013  . GERD (gastroesophageal reflux disease) 12/10/2012  . Osteopenia 06/27/2011  . Depression 03/26/2011  . 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 Degenerative joint disease involving multiple joints 04/06/2010     Past Medical History:  Diagnosis Date  . Anemia   . Anxiety   . Arthritis   . Depression   . H/O hiatal hernia   . Hypercholesterolemia   . Hypertension   . Incontinence of urine   . Neuromuscular disorder ([place])   . Osteoarthritis      Past Surgical History:  Procedure Laterality Date  . 2 Synovial Cysts removed between L4-L5    . ABDOMINAL HYSTERECTOMY    . BACK SURGERY     back fusion after 2 back surgeries   . CARPAL TUNNEL RELEASE    . COLONOSCOPY    . EYE SURGERY     tube inserted for excessive tearing , tube is removed   . Right Knee Arthroscopy    . SALPINGOOPHORECTOMY  1993  . TONSILLECTOMY    . 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   Procedure: TOTAL HIP ARTHROPLASTY;  Surgeon: Kerin Salen, MD;  Location: [place];  Service: Orthopedics;  Laterality: Left;  . TOTAL KNEE ARTHROPLASTY Right 03/04/2016   Procedure: TOTAL KNEE ARTHROPLASTY;  Surgeon: Elsie Saas, MD;  Location: [place];  Service: Orthopedics;  Laterality: Right;  . WISDOM TOOTH EXTRACTION       Family History  Problem Relation Age of Onset  . Hypertension Mother     . Diabetes Mother   . Stroke Mother   . Alcoholism Father   . Alcohol abuse Father   . Depression Sister      Social History   Substance and Sexual Activity  Drug Use No  ,  Social History   Substance and Sexual Activity  Alcohol Use No  ,  Social History   Tobacco Use  Smoking Status Former Smoker  . Types: Cigarettes  . Last attempt to quit: 02/18/1965  . Years since quitting: 53.2  Smokeless Tobacco Never Used  ,    Current Outpatient Medications on File Prior to Visit  Medication Sig Dispense Refill  . acetaminophen (TYLENOL) 325 MG tablet Take 2 tablets (650 mg total) by mouth every 6 (six) hours as needed for mild pain (or Fever >/= 101).    Marland Kitchen amLODipine (NORVASC) 5 MG tablet TAKE 1 TABLET BY MOUTH DAILY BEFORE BREAKFAST 90 tablet 1  . aspirin EC 81 MG tablet Take 81 mg by mouth daily.    . Calcium Carb-Cholecalciferol (CALCIUM 600-D PO) Take 1 tablet by mouth daily.    . Cholecalciferol (VITAMIN D-3) 5000 units TABS Take 5,000 Units by mouth daily.    . DULoxetine (CYMBALTA) 60 MG capsule TAKE 2 CAPSULES BY MOUTH DAILY BEFORE BREAKFAST 180 capsule 3  . gabapentin (NEURONTIN) 300 MG capsule TAKE 3 CAPSULES BY MOUTH 3 TIMES DAILY (Patient taking differently: TAKE 3 CAPSULES BY MOUTH 3 TIMES DAILY - patient mostly takes medication twice a day.) 270 capsule 3  . hydrochlorothiazide (HYDRODIURIL) 25 MG tablet Take 1 tablet (25 mg total) by mouth daily. 90 tablet 3  . levothyroxine (SYNTHROID, LEVOTHROID) 25 MCG tablet TAKE 1 TABLET BY MOUTH DAILY 90 tablet 1  . losartan (COZAAR) 100 MG tablet Take 1 tablet (100 mg total) by mouth daily. 90 tablet 3  . omega-3 acid ethyl esters (LOVAZA) 1 g capsule 2 caps twice daily 360 capsule 3  . oxybutynin (DITROPAN-XL) 10 MG 24 hr tablet Take 1 tablet (10 mg total) by mouth at bedtime. 90 tablet 1  . simvastatin (ZOCOR) 20 MG tablet Take  1 tablet (20 mg total) by mouth Nightly. 90 tablet 3  . traZODone (DESYREL) 100 MG tablet TAKE 1  TABLET BY MOUTH EVERY NIGHT AT BEDTIME 90 tablet 0   No current facility-administered medications on file prior to visit.      Allergies  Allergen Reactions  . Avelox [Moxifloxacin Hcl In Nacl] Other (See Comments)    CHEST PAIN OF UNSPECIFIED ORIGIN  . Cephalosporins Hives and Itching  . Other Itching and Swelling    UNSPECIFIED INSECT STINGS     Review of Systems:   General:  Denies fever, chills Optho/Auditory:   Denies visual changes, blurred vision Respiratory:   Denies SOB, cough, wheeze, DIB  Cardiovascular:   Denies chest pain, palpitations, painful respirations Gastrointestinal:   Denies nausea, vomiting, diarrhea.  Endocrine:     Denies new hot or cold intolerance Musculoskeletal:  Denies joint swelling, gait issues, or new unexplained myalgias/ arthralgias Skin:  Denies rash, suspicious lesions  Neurological:    Denies dizziness, unexplained weakness, numbness  Psychiatric/Behavioral:   Denies mood changes  Objective:    Blood pressure 116/74, pulse 77, temperature 98.2 F (36.8 C), height 5' 6"  (1.676 m), weight 229 lb 12.8 oz (104.2 kg), SpO2 98 %.  Body mass index is 37.09 kg/m.  General: Well Developed, well nourished, and in no acute distress.  HEENT: Normocephalic, atraumatic, pupils equal round reactive to light, neck supple, No carotid bruits, no JVD Neck:  In supraclavicular regions, patient with slight enlargement/swelling/ boggines of the supraclavicular tissues, left greater than right.   No anterior cervical, periauricular, or posterior cervical lymphadenopathy,  no thyromegaly.   In the supraclavicular region on the right, one small, freely mobile cyst, approximately 4 mm diameter, rolling freely over the clavicle.   Patient with prominence of superficial veins of left upper arm, which patient states is chronic in nature. Skin: Warm and dry, cap RF less 2 sec Cardiac: Regular rate and rhythm, S1, S2 WNL's, no murmurs rubs or gallops Respiratory:  ECTA B/L, Not using accessory muscles, speaking in full sentences. NeuroM-Sk: Ambulates w/o assistance, moves ext * 4 w/o difficulty, sensation grossly intact.  Ext: scant edema b/l lower ext Psych: No HI/SI, judgement and insight good, Euthymic mood. Full Affect.

## 2018-04-22 NOTE — Patient Instructions (Addendum)
Mid- April lab only OV for A1c check for pre-diabetes     Risk factors for prediabetes and type 2 diabetes  Researchers don't fully understand why some people develop prediabetes and type 2 diabetes and others don't.  It's clear that certain factors increase the risk, however, including:  Weight. The more fatty tissue you have, the more resistant your cells become to insulin.  Inactivity. The less active you are, the greater your risk. Physical activity helps you control your weight, uses up glucose as energy and makes your cells more sensitive to insulin.  Family history. Your risk increases if a parent or sibling has type 2 diabetes.  Race. Although it's unclear why, people of certain races - including blacks, Hispanics, American Indians and Asian-Americans - are at higher risk.  Age. Your risk increases as you get older. This may be because you tend to exercise less, lose muscle mass and gain weight as you age. But type 2 diabetes is also increasing dramatically among children, adolescents and younger adults.  Gestational diabetes. If you developed gestational diabetes when you were pregnant, your risk of developing prediabetes and type 2 diabetes later increases. If you gave birth to a baby weighing more than 9 pounds (4 kilograms), you're also at risk of type 2 diabetes.  Polycystic ovary syndrome. For women, having polycystic ovary syndrome - a common condition characterized by irregular menstrual periods, excess hair growth and obesity - increases the risk of diabetes.  High blood pressure. Having blood pressure over 140/90 millimeters of mercury (mm Hg) is linked to an increased risk of type 2 diabetes.  Abnormal cholesterol and triglyceride levels. If you have low levels of high-density lipoprotein (HDL), or "good," cholesterol, your risk of type 2 diabetes is higher. Triglycerides are another type of fat carried in the blood. People with high levels of triglycerides have an increased risk  of type 2 diabetes. Your doctor can let you know what your cholesterol and triglyceride levels are.  A good guide to good carbs: The glycemic index ---If you have diabetes, or at risk for diabetes, you know all too well that when you eat carbohydrates, your blood sugar goes up. The total amount of carbs you consume at a meal or in a snack mostly determines what your blood sugar will do. But the food itself also plays a role. A serving of white rice has almost the same effect as eating pure table sugar - a quick, high spike in blood sugar. A serving of lentils has a slower, smaller effect.  ---Picking good sources of carbs can help you control your blood sugar and your weight. Even if you don't have diabetes, eating healthier carbohydrate-rich foods can help ward off a host of chronic conditions, from heart disease to various cancers to, well, diabetes.  ---One way to choose foods is with the glycemic index (GI). This tool measures how much a food boosts blood sugar.  The glycemic index rates the effect of a specific amount of a food on blood sugar compared with the same amount of pure glucose. A food with a glycemic index of 28 boosts blood sugar only 28% as much as pure glucose. One with a GI of 95 acts like pure glucose.    High glycemic foods result in a quick spike in insulin and blood sugar (also known as blood glucose).  Low glycemic foods have a slower, smaller effect- these are healthier for you.   Using the glycemic index Using the glycemic index is  easy: choose foods in the low GI category instead of those in the high GI category (see below), and go easy on those in between. Low glycemic index (GI of 55 or less): Most fruits and vegetables, beans, minimally processed grains, pasta, low-fat dairy foods, and nuts.  Moderate glycemic index (GI 56 to 69): White and sweet potatoes, corn, white rice, couscous, breakfast cereals such as Cream of Wheat and Mini Wheats.  High glycemic index (GI of  70 or higher): White bread, rice cakes, most crackers, bagels, cakes, doughnuts, croissants, most packaged breakfast cereals. You can see the values for 100 commons foods and get links to more at www.health.CheapToothpicks.si.  Swaps for lowering glycemic index  Instead of this high-glycemic index food Eat this lower-glycemic index food  White rice Brown rice or converted rice  Instant oatmeal Steel-cut oats  Cornflakes Bran flakes  Baked potato Pasta, bulgur  White bread Whole-grain bread  Corn Peas or leafy greens       Prediabetes Eating Plan  Prediabetes--also called impaired glucose tolerance or impaired fasting glucose--is a condition that causes blood sugar (blood glucose) levels to be higher than normal. Following a healthy diet can help to keep prediabetes under control. It can also help to lower the risk of type 2 diabetes and heart disease, which are increased in people who have prediabetes. Along with regular exercise, a healthy diet:  Promotes weight loss.  Helps to control blood sugar levels.  Helps to improve the way that the body uses insulin.   WHAT DO I NEED TO KNOW ABOUT THIS EATING PLAN?   Use the glycemic index (GI) to plan your meals. The index tells you how quickly a food will raise your blood sugar. Choose low-GI foods. These foods take a longer time to raise blood sugar.  Pay close attention to the amount of carbohydrates in the food that you eat. Carbohydrates increase blood sugar levels.  Keep track of how many calories you take in. Eating the right amount of calories will help you to achieve a healthy weight. Losing about 7 percent of your starting weight can help to prevent type 2 diabetes.  You may want to follow a Mediterranean diet. This diet includes a lot of vegetables, lean meats or fish, whole grains, fruits, and healthy oils and fats.   WHAT FOODS CAN I EAT?  Grains Whole grains, such as whole-wheat or whole-grain breads, crackers,  cereals, and pasta. Unsweetened oatmeal. Bulgur. Barley. Quinoa. Brown rice. Corn or whole-wheat flour tortillas or taco shells. Vegetables Lettuce. Spinach. Peas. Beets. Cauliflower. Cabbage. Broccoli. Carrots. Tomatoes. Squash. Eggplant. Herbs. Peppers. Onions. Cucumbers. Brussels sprouts. Fruits Berries. Bananas. Apples. Oranges. Grapes. Papaya. Mango. Pomegranate. Kiwi. Grapefruit. Cherries. Meats and Other Protein Sources Seafood. Lean meats, such as chicken and [place] or lean cuts of pork and beef. Tofu. Eggs. Nuts. Beans. Dairy Low-fat or fat-free dairy products, such as yogurt, cottage cheese, and cheese. Beverages Water. Tea. Coffee. Sugar-free or diet soda. Seltzer water. Milk. Milk alternatives, such as soy or almond milk. Condiments Mustard. Relish. Low-fat, low-sugar ketchup. Low-fat, low-sugar barbecue sauce. Low-fat or fat-free mayonnaise. Sweets and Desserts Sugar-free or low-fat pudding. Sugar-free or low-fat ice cream and other frozen treats. Fats and Oils Avocado. Walnuts. Olive oil. The items listed above may not be a complete list of recommended foods or beverages. Contact your dietitian for more options.    WHAT FOODS ARE NOT RECOMMENDED?  Grains Refined white flour and flour products, such as bread, pasta, snack foods, and  cereals. Beverages Sweetened drinks, such as sweet iced tea and soda. Sweets and Desserts Baked goods, such as cake, cupcakes, pastries, cookies, and cheesecake. The items listed above may not be a complete list of foods and beverages to avoid. Contact your dietitian for more information.   This information is not intended to replace advice given to you by your health care provider. Make sure you discuss any questions you have with your health care provider.   Document Released: 06/21/2014 Document Reviewed: 06/21/2014 Elsevier Interactive Patient Education [employer].

## 2018-04-24 ENCOUNTER — Ambulatory Visit
Admission: RE | Admit: 2018-04-24 | Discharge: 2018-04-24 | Disposition: A | Discharge status: Home / Self Care | Payer: PPO | Source: Ambulatory Visit | Attending: Family Medicine | Admitting: Family Medicine

## 2018-04-24 DIAGNOSIS — R221 Localized swelling, mass and lump, neck: Secondary | ICD-10-CM

## 2018-04-24 DIAGNOSIS — R220 Localized swelling, mass and lump, head: Secondary | ICD-10-CM | POA: Diagnosis not present

## 2018-04-28 ENCOUNTER — Other Ambulatory Visit (INDEPENDENT_AMBULATORY_CARE_PROVIDER_SITE_OTHER): Payer: PPO

## 2018-04-28 DIAGNOSIS — R221 Localized swelling, mass and lump, neck: Secondary | ICD-10-CM | POA: Diagnosis not present

## 2018-04-28 DIAGNOSIS — E039 Hypothyroidism, unspecified: Secondary | ICD-10-CM | POA: Diagnosis not present

## 2018-04-28 NOTE — Progress Notes (Signed)
Patient came back for labs - was unable to obtain on 04/22/2018. MPulliam, CMA/RT(R)

## 2018-04-29 LAB — T3, FREE: T3, Free: 2.7 pg/mL (ref 2.0–4.4)

## 2018-04-29 LAB — TSH: TSH: 3.85 u[IU]/mL (ref 0.450–4.500)

## 2018-04-29 LAB — T4, FREE: Free T4: 0.96 ng/dL (ref 0.82–1.77)

## 2018-06-04 ENCOUNTER — Ambulatory Visit: Payer: PPO | Admitting: Family Medicine

## 2018-07-03 DIAGNOSIS — R350 Frequency of micturition: Secondary | ICD-10-CM | POA: Diagnosis not present

## 2018-07-03 DIAGNOSIS — N3946 Mixed incontinence: Secondary | ICD-10-CM | POA: Diagnosis not present

## 2018-07-17 ENCOUNTER — Other Ambulatory Visit: Payer: Self-pay | Admitting: Family Medicine

## 2018-07-17 DIAGNOSIS — F5102 Adjustment insomnia: Secondary | ICD-10-CM

## 2018-08-31 ENCOUNTER — Other Ambulatory Visit: Payer: Self-pay | Admitting: Family Medicine

## 2018-09-03 ENCOUNTER — Other Ambulatory Visit: Payer: Self-pay | Admitting: Family Medicine

## 2018-09-06 DIAGNOSIS — D3132 Benign neoplasm of left choroid: Secondary | ICD-10-CM | POA: Diagnosis not present

## 2018-09-06 DIAGNOSIS — H02834 Dermatochalasis of left upper eyelid: Secondary | ICD-10-CM | POA: Diagnosis not present

## 2018-09-06 DIAGNOSIS — H2513 Age-related nuclear cataract, bilateral: Secondary | ICD-10-CM | POA: Diagnosis not present

## 2018-09-06 DIAGNOSIS — H04123 Dry eye syndrome of bilateral lacrimal glands: Secondary | ICD-10-CM | POA: Diagnosis not present

## 2018-09-06 DIAGNOSIS — H02831 Dermatochalasis of right upper eyelid: Secondary | ICD-10-CM | POA: Diagnosis not present

## 2018-09-08 ENCOUNTER — Other Ambulatory Visit: Payer: Self-pay | Admitting: Family Medicine

## 2018-09-08 DIAGNOSIS — L853 Xerosis cutis: Secondary | ICD-10-CM | POA: Diagnosis not present

## 2018-09-08 DIAGNOSIS — L84 Corns and callosities: Secondary | ICD-10-CM | POA: Diagnosis not present

## 2018-09-08 DIAGNOSIS — L718 Other rosacea: Secondary | ICD-10-CM | POA: Diagnosis not present

## 2018-09-08 DIAGNOSIS — L821 Other seborrheic keratosis: Secondary | ICD-10-CM | POA: Diagnosis not present

## 2018-09-08 DIAGNOSIS — L570 Actinic keratosis: Secondary | ICD-10-CM | POA: Diagnosis not present

## 2018-09-12 ENCOUNTER — Other Ambulatory Visit: Payer: Self-pay | Admitting: Family Medicine

## 2018-09-12 DIAGNOSIS — I10 Essential (primary) hypertension: Secondary | ICD-10-CM

## 2018-09-16 ENCOUNTER — Other Ambulatory Visit: Payer: Self-pay

## 2018-09-16 ENCOUNTER — Encounter: Payer: Self-pay | Admitting: Family Medicine

## 2018-09-16 ENCOUNTER — Ambulatory Visit (INDEPENDENT_AMBULATORY_CARE_PROVIDER_SITE_OTHER): Payer: PPO | Admitting: Family Medicine

## 2018-09-16 VITALS — Ht 66.0 in | Wt 235.0 lb

## 2018-09-16 DIAGNOSIS — E781 Pure hyperglyceridemia: Secondary | ICD-10-CM

## 2018-09-16 DIAGNOSIS — R7303 Prediabetes: Secondary | ICD-10-CM | POA: Diagnosis not present

## 2018-09-16 DIAGNOSIS — E785 Hyperlipidemia, unspecified: Secondary | ICD-10-CM | POA: Diagnosis not present

## 2018-09-16 DIAGNOSIS — I10 Essential (primary) hypertension: Secondary | ICD-10-CM | POA: Diagnosis not present

## 2018-09-16 DIAGNOSIS — E786 Lipoprotein deficiency: Secondary | ICD-10-CM

## 2018-09-16 MED ORDER — NIACIN ER (ANTIHYPERLIPIDEMIC) 1000 MG PO TBCR: 1000.0000 mg | EXTENDED_RELEASE_TABLET | Freq: Every day | ORAL | 3 refills | Status: DC

## 2018-09-16 NOTE — Progress Notes (Signed)
Telehealth office visit note for Shelly Hickman, D.O- at [facility]   I connected with current patient today and verified that I am speaking with the correct person using two identifiers.   . Location of the patient: Home . Location of the provider: Office Only the patient (+/- their family members at pt's discretion) and myself were participating in the encounter    - This visit type was conducted due to national recommendations for restrictions regarding the COVID-19 Pandemic (e.g. social distancing) in an effort to limit this patient's exposure and mitigate transmission in our community.  This format is felt to be most appropriate for this patient at this time.   - The patient did not have access to video technology or had technical difficulties with video requiring transitioning to audio format only. - No physical exam could be performed with this format, beyond that communicated to [place] by the patient/ family members as noted.   - Additionally my office staff/ schedulers discussed with the patient that there may be a monetary charge related to this service, depending on their medical insurance.   The patient expressed understanding, and agreed to proceed.       History of Present Illness:  Says overall she's been "fair," "doing good" and has been "trying to be good" with her lifestyle habits.  Confirms that she is on visit today for chronic follow-up.  Is having left cataract surgery tomorrow.  States that she has been taking Niacin "this whole time" and has run out three weeks ago.    Thyroid Management Patient confirms continuing treatment for thyroid as prescribed. Denies concerns or symptoms at this time.  Prediabetes Says she's doing "not so good, if I'm honest" regarding dietary changes during COVID.  1. HTN HPI:  -  Her blood pressure has been controlled at home.  Pt is sporadically checking it at home.   Does not have a list of what it's been  running, but feels it's been running in the 130's. Bottom number runs in the 80's and sometimes in the 70's.  She checks her blood pressure about once weekly.  - Patient reports good compliance with blood pressure medications  - Denies medication S-E   - Smoking Status noted   - She denies new onset of: chest pain, exercise intolerance, shortness of breath, dizziness, visual changes, headache, lower extremity swelling or claudication.   Last 3 blood pressure readings in our office are as follows: BP Readings from Last 3 Encounters:  10/15/18 121/78  04/22/18 116/74  12/17/17 137/82    Filed Weights   09/16/18 1031  Weight: 235 lb (106.6 kg)    2. 78 y.o. female here for cholesterol follow-up.   - Patient reports good compliance with medications or treatment plan.  Feels her eating habits are not the best in the world.  - Denies medication S-E on increased cholesterol meds. Continues Lovaza and has no complaints.   - Smoking Status noted   - She denies new onset of: chest pain, exercise intolerance, shortness of breath, dizziness, visual changes, headache, lower extremity swelling or claudication.   Denies myalgias  The cholesterol last visit was:  Lab Results  Component Value Date   CHOL 188 12/12/2017   HDL 43 12/12/2017   LDLCALC 110 (H) 12/12/2017   TRIG 174 (H) 12/12/2017   CHOLHDL 4.4 12/12/2017    Hepatic Function Latest Ref Rng & Units 02/02/2018 12/12/2017 10/04/2016  Total Protein 6.0 -  8.5 g/dL - 6.9 6.7  Albumin 3.5 - 4.8 g/dL - 4.3 3.9  AST 0 - 40 IU/L - 25 20  ALT 0 - 32 IU/L 16 18 17   Alk Phosphatase 39 - 117 IU/L - 66 85  Total Bilirubin 0.0 - 1.2 mg/dL - 0.5 0.2     Impression and Recommendations:    1. Essential hypertension, benign   2. Hyperlipidemia, unspecified hyperlipidemia type   3. Hypertriglyceridemia   4. Prediabetes   5. Low HDL (under 40)      - As part of my medical decision making, I reviewed the following data within  the [place] History obtained from pt /family, CMA notes reviewed and incorporated if applicable, Labs reviewed, Radiograph/ tests reviewed if applicable and OV notes from prior OV's with me, as well as other specialists she/he has seen since seeing me last, were all reviewed and used in my medical decision making process today.   - Additionally, discussion had with patient regarding txmnt plan, and their biases/concerns about that plan were used in my medical decision making today.   - The patient agreed with the plan and demonstrated an understanding of the instructions.   No barriers to understanding were identified.   - Red flag symptoms and signs discussed in detail.  Patient expressed understanding regarding what to do in case of emergency\ urgent symptoms.  The patient was advised to call back or seek an in-person evaluation if the symptoms worsen or if the condition fails to improve as anticipated.  - Need for fasting lab work ASAP to re-check A1c, fasting lipid profile.  Thyroid  - Stable as of March of 2020. - Patient asymptomatic at this time. - Continue treatment plan as prescribed.  - Will continue to monitor.  Prediabetes - Last check, A1c was 6.0. - Advised patient that if this increases to 6.5 or more, patient will need to begin medication for diabetes.  - Encouraged ongoing low-carb diet and prudent lifestyle habits to reduce risk of developing diabetes.  - Will continue to monitor.  Essential Hypertension - BP remains stable at this time. - Continue treatment plan as prescribed.  See med list below. - Patient tolerating meds well without complication.  Denies S-E  - Ongoing lifestyle changes such as dash diet and engaging in a regular exercise program discussed with patient.  Educational handouts provided  - Ambulatory BP monitoring encouraged. Keep log and bring in next OV - Patient knows to keep track of whether her blood pressure starts to change,  especially rising.  - Will continue to monitor.  Hyperlipidemia, Hypertriglyceridemia - On 12/17/2017 increased cholesterol meds. - Patient confirms taking one tablet nightly before bed.  See med list. - Patient tolerating meds well without complication.  Denies S-E.  - Patient continues Lovaza and tolerates without complication.  Denies S-E.  - Patient has been taking Niacin 500 mg nightly without complication.  Denies S-E. - Refill of Niacin provided today.  Increased dose to 1000 mg nightly.  - Continue treatment plan as prescribed.  - Last Lipid Panel (12/12/2017)  Triglycerides = 174, elevated HDL = 43 LDL = 110, elevated  - Will continue to monitor.  Recommendations - Touch base in 4 months. - In near future, come in for blood work.    Return for near future- lab only A1c, FLP;  o/w 55mo chronic f/up OV-doxy!.    Spent 21+ minutes with pt today  Meds ordered this encounter  Medications  .  niacin (NIASPAN) 1000 MG CR tablet    Sig: Take 1 tablet (1,000 mg total) by mouth at bedtime.    Dispense:  90 tablet    Refill:  3    Medications Discontinued During This Encounter  Medication Reason  . niacin 500 MG tablet        Note:  This note was prepared with assistance of Dragon voice recognition software. Occasional wrong-word or sound-a-like substitutions may have occurred due to the inherent limitations of voice recognition software.  This document serves as a record of services personally performed by Shelly Dance, DO. It was created on her behalf by Toni Amend, a trained medical scribe. The creation of this record is based on the scribe's personal observations and the provider's statements to them.   I have reviewed the above medical documentation for accuracy and completeness and I concur.  Shelly Dance, DO 09/17/2018 11:12 AM        Patient Care Team    Relationship Specialty Notifications Start End  Shelly Dance, DO PCP -  General Family Medicine  08/30/15   Almedia Balls, MD Consulting Physician Orthopedic Surgery  10/18/15   Kristeen Miss, MD Consulting Physician Neurosurgery  10/18/15    Comment: spine sx  Allyn Kenner, MD Consulting Physician Dermatology  10/18/15    Comment: skin screening   Elsie Saas, MD Consulting Physician Orthopedic Surgery  02/20/16   [facility] [place], MD Attending Physician   10/17/16      -Vitals obtained; medications/ allergies reconciled;  personal medical, social, Sx etc.histories were updated by CMA, reviewed by me and are reflected in chart   Patient Active Problem List   Diagnosis Date Noted  . Bruit of left carotid artery 09/11/2017    Priority: High  . Morbid obesity ([place]) 09/09/2015    Priority: High  . Prediabetes 09/06/2015    Priority: High  . Hypertriglyceridemia 09/06/2015    Priority: High  . HLD (hyperlipidemia) 08/30/2015    Priority: High  . Essential hypertension, benign 07/14/2013    Priority: High  . Anxiety 07/14/2013    Priority: High  . Insomnia 07/14/2013    Priority: High  . Depression 03/26/2011    Priority: High  . Low HDL (under 40) 09/06/2015    Priority: Medium  . Incontinence of urine 09/05/2013    Priority: Medium  . GERD (gastroesophageal reflux disease) 12/10/2012    Priority: Medium  . Spinal stenosis of lumbar region 10/18/2015    Priority: Low  . Neck enlargement- left lateral neck in supraclavicular region- unknkown cause 04/22/2018  . Subclinical hypothyroidism-with symptoms of fatigue, hair loss etc. 12/17/2017  . Burning sensation of lower extremity 12/17/2017  . 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 Irritable bowel syndrome with both constipation and diarrhea 06/24/2017  . Claw toe, acquired, left 05/29/2017  . Thrush, oral 05/01/2017  . Morton neuroma, left 01/20/2017  . Pain in left foot 01/20/2017  . Sialoadenitis- submandibular gland 12/10/2016  . Pre-op evaluation 05/30/2016  . Disorder of vagina- Malodor  of Vagina 12/31/2015  . Encounter for wellness examination 12/31/2015  . Primary localized osteoarthritis of right knee 12/12/2015  . Spinal stenosis in cervical region 10/18/2015  . Memory impairment 08/30/2015  . Vitamin D deficiency 08/30/2015  . Status post left hip replacement 07/14/2013  . Constipation 07/14/2013  . Allergic rhinitis 07/14/2013  . Osteopenia 06/27/2011  . Lacrimal canalicular stenosis 96/75/9163  . Degenerative joint disease involving multiple joints 04/06/2010     Current Meds  Medication  Sig  . acetaminophen (TYLENOL) 325 MG tablet Take 2 tablets (650 mg total) by mouth every 6 (six) hours as needed for mild pain (or Fever >/= 101).  Marland Kitchen aspirin EC 81 MG tablet Take 81 mg by mouth daily.  . Calcium Carb-Cholecalciferol (CALCIUM 600-D PO) Take 1 tablet by mouth daily.  . Cholecalciferol (VITAMIN D-3) 5000 units TABS Take 5,000 Units by mouth daily.  . DULoxetine (CYMBALTA) 60 MG capsule TAKE 2 CAPSULES BY MOUTH DAILY BEFORE BREAKFAST  . hydrochlorothiazide (HYDRODIURIL) 25 MG tablet Take 1 tablet (25 mg total) by mouth daily.  Marland Kitchen losartan (COZAAR) 100 MG tablet Take 1 tablet (100 mg total) by mouth daily.  . metroNIDAZOLE (METROCREAM) 0.75 % cream   . omega-3 acid ethyl esters (LOVAZA) 1 g capsule 2 caps twice daily  . oxybutynin (DITROPAN-XL) 10 MG 24 hr tablet Take 1 tablet (10 mg total) by mouth at bedtime.  . simvastatin (ZOCOR) 20 MG tablet Take 1 tablet (20 mg total) by mouth Nightly.  . traZODone (DESYREL) 100 MG tablet TAKE 1 TABLET BY MOUTH EACH NIGHT AT BEDTIME  . [DISCONTINUED] amLODipine (NORVASC) 5 MG tablet Take 1 tablet (5 mg total) by mouth daily. Needs appointment.  . [DISCONTINUED] gabapentin (NEURONTIN) 300 MG capsule TAKE 3 CAPSULES BY MOUTH 3 TIMES DAILY  . [DISCONTINUED] levothyroxine (SYNTHROID, LEVOTHROID) 25 MCG tablet TAKE 1 TABLET BY MOUTH DAILY  . [DISCONTINUED] niacin 500 MG tablet Take 500 mg by mouth at bedtime.     Allergies:   Allergies  Allergen Reactions  . Avelox [Moxifloxacin Hcl In Nacl] Other (See Comments)    CHEST PAIN OF UNSPECIFIED ORIGIN  . Cephalosporins Hives and Itching  . Other Itching and Swelling    UNSPECIFIED INSECT STINGS     ROS:  See above HPI for pertinent positives and negatives   Objective:   Height 5' 6"  (1.676 m), weight 235 lb (106.6 kg).  (if some vitals are omitted, this means that patient was UNABLE to obtain them even though they were asked to get them prior to OV today.  They were asked to call us at their earliest convenience with these once obtained. )  General: A & O * 3; sounds in no acute distress; in usual state of health.  Skin: Pt confirms warm and dry extremities and pink fingertips HEENT: Pt confirms lips non-cyanotic Chest: Patient confirms normal chest excursion and movement Respiratory: speaking in full sentences, no conversational dyspnea; patient confirms no use of accessory muscles Psych: insight appears good, mood- appears full

## 2018-09-17 DIAGNOSIS — H2512 Age-related nuclear cataract, left eye: Secondary | ICD-10-CM | POA: Diagnosis not present

## 2018-09-17 DIAGNOSIS — H52222 Regular astigmatism, left eye: Secondary | ICD-10-CM | POA: Diagnosis not present

## 2018-09-22 ENCOUNTER — Other Ambulatory Visit: Payer: Self-pay | Admitting: Family Medicine

## 2018-09-22 DIAGNOSIS — E039 Hypothyroidism, unspecified: Secondary | ICD-10-CM

## 2018-09-22 DIAGNOSIS — E038 Other specified hypothyroidism: Secondary | ICD-10-CM

## 2018-09-24 ENCOUNTER — Other Ambulatory Visit: Payer: Self-pay | Admitting: Family Medicine

## 2018-09-24 DIAGNOSIS — I10 Essential (primary) hypertension: Secondary | ICD-10-CM

## 2018-10-01 DIAGNOSIS — H2511 Age-related nuclear cataract, right eye: Secondary | ICD-10-CM | POA: Diagnosis not present

## 2018-10-03 ENCOUNTER — Other Ambulatory Visit: Payer: Self-pay | Admitting: Family Medicine

## 2018-10-14 ENCOUNTER — Other Ambulatory Visit: Payer: Self-pay

## 2018-10-14 DIAGNOSIS — R7303 Prediabetes: Secondary | ICD-10-CM

## 2018-10-14 DIAGNOSIS — E781 Pure hyperglyceridemia: Secondary | ICD-10-CM

## 2018-10-15 ENCOUNTER — Ambulatory Visit (INDEPENDENT_AMBULATORY_CARE_PROVIDER_SITE_OTHER): Payer: PPO | Admitting: Family Medicine

## 2018-10-15 ENCOUNTER — Other Ambulatory Visit: Payer: PPO

## 2018-10-15 ENCOUNTER — Other Ambulatory Visit: Payer: Self-pay

## 2018-10-15 ENCOUNTER — Encounter: Payer: Self-pay | Admitting: Family Medicine

## 2018-10-15 VITALS — BP 121/78 | HR 84 | Wt 236.1 lb

## 2018-10-15 DIAGNOSIS — M545 Low back pain, unspecified: Secondary | ICD-10-CM

## 2018-10-15 DIAGNOSIS — M2042 Other hammer toe(s) (acquired), left foot: Secondary | ICD-10-CM | POA: Diagnosis not present

## 2018-10-15 DIAGNOSIS — F339 Major depressive disorder, recurrent, unspecified: Secondary | ICD-10-CM | POA: Diagnosis not present

## 2018-10-15 DIAGNOSIS — M62830 Muscle spasm of back: Secondary | ICD-10-CM

## 2018-10-15 DIAGNOSIS — E781 Pure hyperglyceridemia: Secondary | ICD-10-CM | POA: Diagnosis not present

## 2018-10-15 DIAGNOSIS — B351 Tinea unguium: Secondary | ICD-10-CM | POA: Diagnosis not present

## 2018-10-15 DIAGNOSIS — R7303 Prediabetes: Secondary | ICD-10-CM | POA: Diagnosis not present

## 2018-10-15 DIAGNOSIS — Z23 Encounter for immunization: Secondary | ICD-10-CM

## 2018-10-15 LAB — POCT URINALYSIS DIP (MANUAL ENTRY)
Bilirubin, UA: NEGATIVE
Blood, UA: NEGATIVE
Glucose, UA: NEGATIVE mg/dL
Ketones, POC UA: NEGATIVE mg/dL
Nitrite, UA: NEGATIVE
Protein Ur, POC: NEGATIVE mg/dL
Spec Grav, UA: 1.015 (ref 1.010–1.025)
Urobilinogen, UA: 0.2 E.U./dL
pH, UA: 7 (ref 5.0–8.0)

## 2018-10-15 MED ORDER — CYCLOBENZAPRINE HCL 5 MG PO TABS: ORAL_TABLET | ORAL | 1 refills | Status: DC

## 2018-10-15 NOTE — Patient Instructions (Signed)
We referred you to [facility].  Please call our office on Monday if you have not hear from them by then to schedule an appointment with you for your back pain   Please remember to call your podiatrist Dr. Paulla Dolly regarding your hammertoe on your left second digit of your foot   Toe Deformity Repair  Toe deformity repair is a surgery to reposition a toe that stays bent in an unnatural position. For example, a hammer toe is a deformity that causes the middle joint of a toe to stay bent. A toe deformity can cause pain and may interfere with walking. You may need surgery if other treatments have not helped to straighten the toe and relieve your symptoms. Tell a health care provider about:  Any allergies you have.  All medicines you are taking, including vitamins, herbs, eye drops, creams, and over-the-counter medicines.  Any problems you or family members have had with anesthetic medicines.  Any blood disorders you have.  Any surgeries you have had.  Any medical conditions you have.  Whether you are pregnant or may be pregnant. What are the risks? Generally, this is a safe procedure. However, problems may occur, including:  Infection.  Bleeding.  Allergic reactions to medicines.  Damage to nerves.  Swelling.  Pain.  Numbness.  Scarring.  Toe positioning that is not straight (poor toe alignment). What happens before the procedure? Staying hydrated Follow instructions from your health care provider about hydration, which may include:  Up to 2 hours before the procedure - you may continue to drink clear liquids, such as water, clear fruit juice, black coffee, and plain tea.  Eating and drinking restrictions Follow instructions from your health care provider about eating and drinking, which may include:  8 hours before the procedure - stop eating heavy meals or foods such as meat, fried foods, or fatty foods.  6 hours before the procedure - stop  eating light meals or foods, such as toast or cereal.  6 hours before the procedure - stop drinking milk or drinks that contain milk.  2 hours before the procedure - stop drinking clear liquids. Medicines  Ask your health care provider about: ? Changing or stopping your regular medicines. This is especially important if you are taking diabetes medicines or blood thinners. ? Taking medicines such as aspirin and ibuprofen. These medicines can thin your blood. Do not take these medicines unless your health care provider tells you to take them. ? Taking over-the-counter medicines, vitamins, herbs, and supplements.  You may be given antibiotic medicine to help prevent an infection. General instructions  Plan to have someone take you home from the hospital or clinic.  Plan to have a responsible adult care for you for at least 24 hours after you leave the hospital or clinic. This is important.  Ask your health care provider how your surgical site will be marked or identified.  You may be asked to shower with a germ-killing soap. What happens during the procedure?   To lower your risk of infection: ? Your health care team will wash or sanitize their hands. ? Hair may be removed from the surgical area. ? Your skin will be washed with soap.  An IV will be inserted into one of your veins.  You will be given one of the following: ? A medicine to help you relax (sedative). ? A medicine to numb the area (local anesthetic). The medicine will be injected directly into your toe. ? A  medicine to make you fall asleep (general anesthetic).  The surgeon will make one or more incisions on your affected toe.  If extra bone or extra soft tissue is causing the deformity, it will be removed.  If connective tissues such as tendons or ligaments are causing the deformity, they will be removed or relocated.  Surgical pins or screws will be placed to hold your toe in place during the healing process.   The incisions will be closed with stitches (sutures).  A bandage (dressing) will be placed over the incision area. The procedure may vary among health care providers and hospitals. What happens after the procedure?  Your blood pressure, heart rate, breathing rate, and blood oxygen level will be monitored until the medicines you were given have worn off.  You will be given pain medicine as needed.  You may have a post-operative shoe placed on your foot.  Do not drive for 24 hours if you were given a sedative during your procedure. Summary  A toe that stays bent in an unnatural position can be repaired with surgery (toe deformity repair).  You may need surgery if other treatments have not helped to straighten the toe and relieve your symptoms.  Before the procedure, follow instructions from your health care provider about eating and drinking.  During the procedure, you may be given a medicine to numb the toe area or a medicine to make you fall asleep. This information is not intended to replace advice given to you by your health care provider. Make sure you discuss any questions you have with your health care provider. Document Released: 02/02/2000 Document Revised: 05/27/2018 Document Reviewed: 10/15/2016 Elsevier Patient Education  2020 [employer].

## 2018-10-15 NOTE — Progress Notes (Signed)
Impression and Recommendations:    1. Bilateral low back pain without sciatica, unspecified chronicity   2. Spasm of back muscles-  left greater than right and lumbar region   3. Onychomycosis of toenail   4. Hammer toe of left foot   5. Depression, recurrent ([place])   6. Need for immunization against influenza     Chronic Back Pain, Spasm of Back Muscles (L greater than R in lumbar region) - Discussed possible causes of patient sx at length. - Provided extensive education about muscle spasm. - All questions were answered.  - Patient agrees to physical therapy referral today. - Extensively discussed benefits of PT with patient during appointment.  Reviewed importance of following recommendations of PT and understanding the body to help relieve and prevent pain like this in the future. - Patient prefers [facility].  Referral placed today.  - Flexeril prescribed today.  See med list. - Discussed prudent use of muscle relaxers with patient today. - Reviewed risks and benefits of medication.  - Patient agrees to take a half tablet to start with and test for S-E. - Pt understands not to take muscle relaxer during the day if it makes her feel drunk or dizzy.  - To help alleviate the pain, apply heat to the area of sx. - Advised patient to engage in regular walking on flat, forgiving ground. - Discussed that as patient is now at an older age, to begin exercise habits slowly as tolerated.  - Advised patient to sit in ways that alleviate pressure to her pelvis. - Do NOT engage in activities that involve lifting and twisting. - Extensively discussed techniques to help alleviate stress or pressure to the area.  - Adequate hydration and prudent health habits encouraged.  - Will continue to monitor. - Patient knows to call in for concerns PRN or emergency care if red flag sx emerge.  Toe & Foot Concerns - Onchomycosis of Toenail, Hammer Toe of Left Foot  - For patient's toe/foot  concerns, encouraged patient to follow-up with foot surgeon/specialist. - Patient understands the importance of ongoing specialty care and following management as prescribed and recommended. - Education was provided and all questions were answered.  - Patient agrees to call Dr. Paulla Dolly for further follow-up.  - Will continue to monitor.  Depression & Mood during COVID - Stable at this time. - Continue treatment plan as established.  See med list.  - Patient agrees to make small goals to take care of household chores. - Discussed importance of setting goals to increase productivity and feelings of accomplishment.  - Reviewed the "spokes of the wheel" of mood and health management.  Stressed the importance of ongoing prudent habits, including regular exercise, counseling with therapist PRN, Rx medications PRN, appropriate sleep hygiene, healthful dietary habits, and prayer/meditation to relax.  - Will continue to monitor and adjust treatment plan PRN.   Education and routine counseling performed. Handouts provided.   Return for Medicare wellness near future at your convenience & f-up around Oct / Nov for chronic care OV.   Gross side effects, risk and benefits, and alternatives of medications discussed with patient.  Patient is aware that all medications have potential side effects and we are unable to predict every sideeffect or drug-drug interaction that may occur.  Expresses verbal understanding and consents to current therapy plan and treatment regiment.  Please see AVS handed out to patient at the end of our visit for further patient instructions/ counseling done pertaining  to today's office visit.  This document serves as a record of services personally performed by Mellody Dance, DO. It was created on her behalf by Toni Amend, a trained medical scribe. The creation of this record is based on the scribe's personal observations and the provider's statements to them.   I  have reviewed the above medical documentation for accuracy and completeness and I concur.  Mellody Dance, DO 10/15/2018 12:56 PM       -------------------------------------------------------------------------------------------------------------------------------------------------------------------------------------------------------------------------------------------- Subjective:   CC: BACK PAIN  Problem  Depression, Recurrent ([facility])   Feels like she is doing emotionally better.  Spoke with pastor- gettign counseling  Daughter doing well and focusing on her 76yo D who sshe eneds to be strong for.  Pt not walking yet much but plans to go to the [facility].      Onychomycosis of Toenail  Hammer Toe of Left Foot  Spasm of back muscles-  left greater than right and lumbar region  Bilateral Low Back Pain Without Sciatica     Has been experiencing back pain, starting about two weeks ago. Notes at first suspected a UTI; went to have a urine sample taken. Notes the pain is not quite as bad today.  Onset: Does not remember doing anything in particular to cause the pain; says "I have not been walking though, and that's what I'm afraid it's from: inactivity."  Notes she cannot remember injury to the area.  Denies known trauma.  Woke up two weeks ago with pain and says she immediately began drinking a lot more water.  Says "that first day, I realized I had been drinking a lot of Diet Coke, so I flushed my system out with water that entire day and felt so much better after that."  This was about a week and a half ago.  However, notes the pain has not really improved since then.  Patient has had back pain in the past.  Notes this does not feel like her historical back pain.    Location: States it's occurring on both sides "from about waist-down."   Radiation: The pain does not travel down; "is not sciatica that I know of."  It does not travel to the front.  Denies nausea, fever, chills.  Denies increased urinary frequency, urgency, vaginal discharge, new loose stools or constipation.  Worse with: Pain worse when she stands on her feet or walks on the cement floor in the grocery store. Says standing for over 20-30 minutes at a time makes it worse.  Notes it's still hurting even when she tries to get into a comfortable position in bed at night.  Says "I'm uninspired to do much of anything right now."  Has tried: Has been taking her husband's meloxicam, has taken several, maybe four at the most, "just one a day."  Has taken no tylenol or anything else.  Says the meloxicam "seems to improve the pain."  Denies pain when spine itself is palpated.  Red Flags Fecal/urinary incontinence: no Numbness/Weakness: no  Fever/chills/sweats: no  Night pain: no  Unexplained weight loss: no No relief with bedrest: no  h/o cancer/immunosuppression: no  IV drug use: no  PMH of osteoporosis or chronic steroid use: no    Athlete's Foot States she has athlete's foot which she is currently treating with Terbinafine and white vinegar soaks.  Has been to Dr. Sharol Given and Dr. Paulla Dolly for foot concerns in the past.  Notes she has a history of foot surgery which took a long while to  heal, "and that was when I was much younger."  Mood During COVID Feeling lazy, not motivated to clean up, "has a whole living room full of junk I need to get rid of but haven't felt like it."  Says "I just need to get motivated to get out and walk.  Does not feel she needs further assistance at this time.   Patient Care Team    Relationship Specialty Notifications Start End  Mellody Dance, DO PCP - General Family Medicine  08/30/15   Almedia Balls, MD Consulting Physician Orthopedic Surgery  10/18/15   Kristeen Miss, MD Consulting Physician Neurosurgery  10/18/15    Comment: spine sx  Allyn Kenner, MD Consulting Physician Dermatology  10/18/15    Comment: skin screening   Elsie Saas, MD Consulting Physician  Orthopedic Surgery  02/20/16   [facility] [place], MD Attending Physician   10/17/16     The following portions of the patient's history were reviewed and updated as appropriate: allergies, current medications, past family history, past medical history, past social history, past surgical history and problem list.  Previous Medications   ACETAMINOPHEN (TYLENOL) 325 MG TABLET    Take 2 tablets (650 mg total) by mouth every 6 (six) hours as needed for mild pain (or Fever >/= 101).   AMLODIPINE (NORVASC) 5 MG TABLET    TAKE 1 TABLET BY MOUTH DAILY   ASPIRIN EC 81 MG TABLET    Take 81 mg by mouth daily.   CALCIUM CARB-CHOLECALCIFEROL (CALCIUM 600-D PO)    Take 1 tablet by mouth daily.   CHOLECALCIFEROL (VITAMIN D-3) 5000 UNITS TABS    Take 5,000 Units by mouth daily.   DULOXETINE (CYMBALTA) 60 MG CAPSULE    TAKE 2 CAPSULES BY MOUTH DAILY BEFORE BREAKFAST   GABAPENTIN (NEURONTIN) 300 MG CAPSULE    TAKE 3 CAPSULES BY MOUTH THREE TIMES A DAY   HYDROCHLOROTHIAZIDE (HYDRODIURIL) 25 MG TABLET    Take 1 tablet (25 mg total) by mouth daily.   LEVOTHYROXINE (SYNTHROID) 25 MCG TABLET    TAKE 1 TABLET BY MOUTH DAILY   LOSARTAN (COZAAR) 100 MG TABLET    Take 1 tablet (100 mg total) by mouth daily.   METRONIDAZOLE (METROCREAM) 0.75 % CREAM       NIACIN (NIASPAN) 1000 MG CR TABLET    Take 1 tablet (1,000 mg total) by mouth at bedtime.   OMEGA-3 ACID ETHYL ESTERS (LOVAZA) 1 G CAPSULE    2 caps twice daily   OXYBUTYNIN (DITROPAN-XL) 10 MG 24 HR TABLET    Take 1 tablet (10 mg total) by mouth at bedtime.   SIMVASTATIN (ZOCOR) 20 MG TABLET    Take 1 tablet (20 mg total) by mouth Nightly.   TRAZODONE (DESYREL) 100 MG TABLET    TAKE 1 TABLET BY MOUTH EACH NIGHT AT BEDTIME    Review of Systems: General:   Denies fever, chills, unexplained weight loss.  Optho/Auditory:   Denies visual changes, blurred vision/LOV Respiratory:   Denies SOB, DOE more than baseline levels.   Cardiovascular:   Denies chest pain,  palpitations, new onset peripheral edema  Gastrointestinal:   Denies nausea, vomiting, diarrhea.  Genitourinary: Denies dysuria, freq/ urgency, flank pain or discharge from genitals.  Endocrine:     Denies hot or cold intolerance, polyuria, polydipsia. Musculoskeletal:   + myalgias, denies joint swelling, + arthralgias, denies gait problems.  Skin:  Denies rash, suspicious lesions Neurological:     Denies dizziness, unexplained weakness, numbness  Psychiatric/Behavioral:  Denies mood changes, suicidal or homicidal ideations, hallucinations    Objective:  Blood pressure 121/78, pulse 84, weight 236 lb 1.9 oz (107.1 kg), SpO2 95 %. Body mass index is 38.11 kg/m.  Gen: A & O *3,  No acute distress HEENT:  [place]/AT Back Exam:  Inspection:  Paravertebral muscle tightness and spasms bilateral thoracic region, as well as most prominent on the left in the lumbar region that extends down to the buttocks. Motion: Flexion extension side bending and rotation was all diminished due to subjective pain of patient today SLR laying: Negative   Palpable tenderness: Yes, bilateral thoracic para-vertebral muscles, Sensory change: Gross sensation intact to all lumbar and sacral dermatomes.  Reflexes: 1+ at both patellar tendons, 1+ at achilles tendons Strength at foot  + hammer toe- L 2nd toe. Plantar-flexion: 5/5 Dorsi-flexion: 5/5 Leg strength  Quad: 5/5 Hip flexor: 5/5 Hip abductors: 5/5  Gait unremarkable.

## 2018-10-16 ENCOUNTER — Other Ambulatory Visit: Payer: Self-pay | Admitting: Family Medicine

## 2018-10-16 DIAGNOSIS — M62830 Muscle spasm of back: Secondary | ICD-10-CM | POA: Diagnosis not present

## 2018-10-16 DIAGNOSIS — M545 Low back pain: Secondary | ICD-10-CM | POA: Diagnosis not present

## 2018-10-16 DIAGNOSIS — M6281 Muscle weakness (generalized): Secondary | ICD-10-CM | POA: Diagnosis not present

## 2018-10-16 DIAGNOSIS — F5102 Adjustment insomnia: Secondary | ICD-10-CM

## 2018-10-16 LAB — LIPID PANEL
Chol/HDL Ratio: 2.9 ratio (ref 0.0–4.4)
Cholesterol, Total: 146 mg/dL (ref 100–199)
HDL: 51 mg/dL (ref >39)
LDL Calculated: 80 mg/dL (ref 0–99)
Triglycerides: 76 mg/dL (ref 0–149)
VLDL Cholesterol Cal: 15 mg/dL (ref 5–40)

## 2018-10-16 LAB — HEMOGLOBIN A1C
Est. average glucose Bld gHb Est-mCnc: 126 mg/dL
Hgb A1c MFr Bld: 6 % — ABNORMAL HIGH (ref 4.8–5.6)

## 2018-10-20 DIAGNOSIS — M62830 Muscle spasm of back: Secondary | ICD-10-CM | POA: Diagnosis not present

## 2018-10-20 DIAGNOSIS — M6281 Muscle weakness (generalized): Secondary | ICD-10-CM | POA: Diagnosis not present

## 2018-10-20 DIAGNOSIS — M545 Low back pain: Secondary | ICD-10-CM | POA: Diagnosis not present

## 2018-10-23 DIAGNOSIS — M62830 Muscle spasm of back: Secondary | ICD-10-CM | POA: Diagnosis not present

## 2018-10-23 DIAGNOSIS — M545 Low back pain: Secondary | ICD-10-CM | POA: Diagnosis not present

## 2018-10-23 DIAGNOSIS — M6281 Muscle weakness (generalized): Secondary | ICD-10-CM | POA: Diagnosis not present

## 2018-10-28 DIAGNOSIS — M62830 Muscle spasm of back: Secondary | ICD-10-CM | POA: Diagnosis not present

## 2018-10-28 DIAGNOSIS — M545 Low back pain: Secondary | ICD-10-CM | POA: Diagnosis not present

## 2018-10-28 DIAGNOSIS — M6281 Muscle weakness (generalized): Secondary | ICD-10-CM | POA: Diagnosis not present

## 2018-10-30 ENCOUNTER — Other Ambulatory Visit: Payer: Self-pay | Admitting: Family Medicine

## 2018-10-30 DIAGNOSIS — M6281 Muscle weakness (generalized): Secondary | ICD-10-CM | POA: Diagnosis not present

## 2018-10-30 DIAGNOSIS — M62830 Muscle spasm of back: Secondary | ICD-10-CM | POA: Diagnosis not present

## 2018-10-30 DIAGNOSIS — M545 Low back pain: Secondary | ICD-10-CM | POA: Diagnosis not present

## 2018-11-02 DIAGNOSIS — L820 Inflamed seborrheic keratosis: Secondary | ICD-10-CM | POA: Diagnosis not present

## 2018-11-02 DIAGNOSIS — D492 Neoplasm of unspecified behavior of bone, soft tissue, and skin: Secondary | ICD-10-CM | POA: Diagnosis not present

## 2018-11-02 DIAGNOSIS — Z961 Presence of intraocular lens: Secondary | ICD-10-CM | POA: Diagnosis not present

## 2018-11-03 DIAGNOSIS — M62830 Muscle spasm of back: Secondary | ICD-10-CM | POA: Diagnosis not present

## 2018-11-03 DIAGNOSIS — M545 Low back pain: Secondary | ICD-10-CM | POA: Diagnosis not present

## 2018-11-03 DIAGNOSIS — M6281 Muscle weakness (generalized): Secondary | ICD-10-CM | POA: Diagnosis not present

## 2018-11-05 DIAGNOSIS — M545 Low back pain: Secondary | ICD-10-CM | POA: Diagnosis not present

## 2018-11-05 DIAGNOSIS — M6281 Muscle weakness (generalized): Secondary | ICD-10-CM | POA: Diagnosis not present

## 2018-11-05 DIAGNOSIS — M62830 Muscle spasm of back: Secondary | ICD-10-CM | POA: Diagnosis not present

## 2018-11-12 DIAGNOSIS — M6281 Muscle weakness (generalized): Secondary | ICD-10-CM | POA: Diagnosis not present

## 2018-11-12 DIAGNOSIS — M62830 Muscle spasm of back: Secondary | ICD-10-CM | POA: Diagnosis not present

## 2018-11-12 DIAGNOSIS — M545 Low back pain: Secondary | ICD-10-CM | POA: Diagnosis not present

## 2018-11-17 DIAGNOSIS — M545 Low back pain: Secondary | ICD-10-CM | POA: Diagnosis not present

## 2018-11-17 DIAGNOSIS — M62830 Muscle spasm of back: Secondary | ICD-10-CM | POA: Diagnosis not present

## 2018-11-17 DIAGNOSIS — M6281 Muscle weakness (generalized): Secondary | ICD-10-CM | POA: Diagnosis not present

## 2018-11-19 DIAGNOSIS — M62830 Muscle spasm of back: Secondary | ICD-10-CM | POA: Diagnosis not present

## 2018-11-19 DIAGNOSIS — M545 Low back pain: Secondary | ICD-10-CM | POA: Diagnosis not present

## 2018-11-19 DIAGNOSIS — M6281 Muscle weakness (generalized): Secondary | ICD-10-CM | POA: Diagnosis not present

## 2018-11-24 DIAGNOSIS — M545 Low back pain: Secondary | ICD-10-CM | POA: Diagnosis not present

## 2018-11-24 DIAGNOSIS — M62830 Muscle spasm of back: Secondary | ICD-10-CM | POA: Diagnosis not present

## 2018-11-24 DIAGNOSIS — M6281 Muscle weakness (generalized): Secondary | ICD-10-CM | POA: Diagnosis not present

## 2018-11-26 DIAGNOSIS — M62830 Muscle spasm of back: Secondary | ICD-10-CM | POA: Diagnosis not present

## 2018-11-26 DIAGNOSIS — M545 Low back pain: Secondary | ICD-10-CM | POA: Diagnosis not present

## 2018-11-26 DIAGNOSIS — M6281 Muscle weakness (generalized): Secondary | ICD-10-CM | POA: Diagnosis not present

## 2018-12-01 DIAGNOSIS — M545 Low back pain: Secondary | ICD-10-CM | POA: Diagnosis not present

## 2018-12-01 DIAGNOSIS — M62830 Muscle spasm of back: Secondary | ICD-10-CM | POA: Diagnosis not present

## 2018-12-01 DIAGNOSIS — M6281 Muscle weakness (generalized): Secondary | ICD-10-CM | POA: Diagnosis not present

## 2018-12-03 DIAGNOSIS — M6281 Muscle weakness (generalized): Secondary | ICD-10-CM | POA: Diagnosis not present

## 2018-12-03 DIAGNOSIS — M545 Low back pain: Secondary | ICD-10-CM | POA: Diagnosis not present

## 2018-12-03 DIAGNOSIS — M62830 Muscle spasm of back: Secondary | ICD-10-CM | POA: Diagnosis not present

## 2018-12-23 DIAGNOSIS — Z1231 Encounter for screening mammogram for malignant neoplasm of breast: Secondary | ICD-10-CM | POA: Diagnosis not present

## 2018-12-23 LAB — HM MAMMOGRAPHY: HM Mammogram: NORMAL (ref 0–4)

## 2018-12-29 ENCOUNTER — Other Ambulatory Visit: Payer: Self-pay | Admitting: Family Medicine

## 2018-12-31 ENCOUNTER — Telehealth: Payer: Self-pay | Admitting: Family Medicine

## 2018-12-31 NOTE — Telephone Encounter (Signed)
Patient called left message state she has several Rx refills that maybe tied to her having her Annual CPE and wanted to set up appt.  --Called pt back no answer, left msg advising her of provider Appt availability for CPEs & suggested she try instead for a F/U to get Rx refills.  Forwarding fyi to med asst in case pharmacy has sent refill request via electronically.  --glh

## 2019-01-04 ENCOUNTER — Telehealth: Payer: Self-pay | Admitting: Family Medicine

## 2019-01-04 NOTE — Progress Notes (Signed)
Virtual Visit via Telephone Note  I connected with Shelly Hickman on 01/05/2019 at  4:15 PM EST by telephone and verified that I am speaking with the correct person using two identifiers.  Location: Patient: Home  Provider: In Clinic   I discussed the limitations, risks, security and privacy concerns of performing an evaluation and management service by telephone and the availability of in person appointments. I also discussed with the patient that there may be a patient responsible charge related to this service. The patient expressed understanding and agreed to proceed.   History of Present Illness: Ms. Shelly Hickman calls with sore throat- much improved 2/10, yesterday 4/10, described as "sore"- sx's present for one week. L ear felt clogged yest Clear nasal drainage Occasional non-productive She denies fever She denies N/V/D/C She denies loss sense of taste. She has loss of sense of smell for years- she denies hx of head injury. She denies HA. She denies known exposure to Corona Virus or Strep Virus She reports 100% compliance  She has been using nighttime gargles with listerine   Patient Care Team    Relationship Specialty Notifications Start End  Shelly Dance, DO PCP - General Family Medicine  08/30/15   Shelly Balls, MD Consulting Physician Orthopedic Surgery  10/18/15   Shelly Miss, MD Consulting Physician Neurosurgery  10/18/15    Comment: spine sx  Shelly Kenner, MD Consulting Physician Dermatology  10/18/15    Comment: skin screening   Shelly Saas, MD Consulting Physician Orthopedic Surgery  02/20/16   [facility] [place], MD Attending Physician   10/17/16     Patient Active Problem List   Diagnosis Date Noted  . Onychomycosis of toenail 10/15/2018  . Hammer toe of left foot 10/15/2018  . Spasm of back muscles-  left greater than right and lumbar region 10/15/2018  . Bilateral low back pain without sciatica 10/15/2018  . Neck enlargement- left lateral neck in  supraclavicular region- unknkown cause 04/22/2018  . Subclinical hypothyroidism-with symptoms of fatigue, hair loss etc. 12/17/2017  . Burning sensation of lower extremity 12/17/2017  . Bruit of left carotid artery 09/11/2017  . 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 Irritable bowel syndrome with both constipation and diarrhea 06/24/2017  . Claw toe, acquired, left 05/29/2017  . Thrush, oral 05/01/2017  . Morton neuroma, left 01/20/2017  . Pain in left foot 01/20/2017  . Sialoadenitis- submandibular gland 12/10/2016  . Pre-op evaluation 05/30/2016  . 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 Encounter for wellness examination 12/31/2015  . Primary localized osteoarthritis of right knee 12/12/2015  . Spinal stenosis in cervical region 10/18/2015  . Spinal stenosis of lumbar region 10/18/2015  . Morbid obesity ([place]) 09/09/2015  . Prediabetes 09/06/2015  . Low HDL (under 40) 09/06/2015  . Hypertriglyceridemia 09/06/2015  . HLD (hyperlipidemia) 08/30/2015  . Memory impairment 08/30/2015  . Vitamin D deficiency 08/30/2015  . Incontinence of urine 09/05/2013  . Status post left hip replacement 07/14/2013  . Essential hypertension, benign 07/14/2013  . Constipation 07/14/2013  . Allergic rhinitis 07/14/2013  . Anxiety 07/14/2013  . Insomnia 07/14/2013  . GERD (gastroesophageal reflux disease) 12/10/2012  . Osteopenia 06/27/2011  . Depression, recurrent ([place]) 03/26/2011  . 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 Degenerative joint disease involving multiple joints 04/06/2010     Past Medical History:  Diagnosis Date  . Anemia   . Anxiety   . Arthritis   . Depression   . H/O hiatal hernia   . Hypercholesterolemia   .  Hypertension   . Incontinence of urine   . Neuromuscular disorder ([place])   . Osteoarthritis      Past Surgical History:  Procedure Laterality Date  . 2 Synovial Cysts removed between L4-L5    . ABDOMINAL HYSTERECTOMY    . BACK SURGERY     back  fusion after 2 back surgeries   . CARPAL TUNNEL RELEASE    . COLONOSCOPY    . EYE SURGERY     tube inserted for excessive tearing , tube is removed   . Right Knee Arthroscopy    . SALPINGOOPHORECTOMY  1993  . TONSILLECTOMY    . 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   Procedure: TOTAL HIP ARTHROPLASTY;  Surgeon: Shelly Salen, MD;  Location: [facility];  Service: Orthopedics;  Laterality: Left;  . TOTAL KNEE ARTHROPLASTY Right 03/04/2016   Procedure: TOTAL KNEE ARTHROPLASTY;  Surgeon: Shelly Saas, MD;  Location: [facility];  Service: Orthopedics;  Laterality: Right;  . WISDOM TOOTH EXTRACTION       Family History  Problem Relation Age of Onset  . Hypertension Mother   . Diabetes Mother   . Stroke Mother   . Alcoholism Father   . Alcohol abuse Father   . Depression Sister      Social History   Substance and Sexual Activity  Drug Use No     Social History   Substance and Sexual Activity  Alcohol Use No     Social History   Tobacco Use  Smoking Status Former Smoker  . Types: Cigarettes  . Quit date: 02/18/1965  . Years since quitting: 53.9  Smokeless Tobacco Never Used     Outpatient Encounter Medications as of 01/05/2019  Medication Sig Note  . acetaminophen (TYLENOL) 325 MG tablet Take 2 tablets (650 mg total) by mouth every 6 (six) hours as needed for mild pain (or Fever >/= 101). 05/01/2017: Doesn't take everyday   . amLODipine (NORVASC) 5 MG tablet TAKE 1 TABLET BY MOUTH DAILY   . aspirin EC 81 MG tablet Take 81 mg by mouth daily.   . Calcium Carb-Cholecalciferol (CALCIUM 600-D PO) Take 1 tablet by mouth daily.   . Cholecalciferol (VITAMIN D-3) 5000 units TABS Take 5,000 Units by mouth daily.   . cyclobenzaprine (FLEXERIL) 5 MG tablet 1 tab po q am and afternoon, 2 po q hs PRN muscle spasms   . DULoxetine (CYMBALTA) 60 MG capsule TAKE 2 CAPSULES BY MOUTH DAILY BEFORE BREAKFAST.  OFFICE VISIT REQUIRED PRIOR TO ANY FURTHER REFILLS   . gabapentin (NEURONTIN) 300 MG  capsule TAKE 3 CAPSULES BY MOUTH 3 TIMES DAILY   . hydrochlorothiazide (HYDRODIURIL) 25 MG tablet Take 1 tablet (25 mg total) by mouth daily. OFFICE VISIT REQUIRED PRIOR TO ANY FURTHER REFILLS   . levothyroxine (SYNTHROID) 25 MCG tablet TAKE 1 TABLET BY MOUTH DAILY   . losartan (COZAAR) 100 MG tablet Take 1 tablet (100 mg total) by mouth daily. OFFICE VISIT REQUIRED PRIOR TO ANY FURTHER REFILLS   . metroNIDAZOLE (METROCREAM) 0.75 % cream    . niacin (NIASPAN) 1000 MG CR tablet Take 1 tablet (1,000 mg total) by mouth at bedtime.   Marland Kitchen omega-3 acid ethyl esters (LOVAZA) 1 g capsule 2 caps twice daily   . oxybutynin (DITROPAN-XL) 10 MG 24 hr tablet Take 1 tablet (10 mg total) by mouth at bedtime.   . simvastatin (ZOCOR) 20 MG tablet Take 1 tablet (20 mg total) by mouth Nightly.   . traZODone (DESYREL) 100 MG  tablet TAKE 1 TABLET BY MOUTH DAILY AT BEDTIME    No facility-administered encounter medications on file as of 01/05/2019.     Allergies: Avelox [moxifloxacin hcl in nacl], Cephalosporins, and Other  There is no height or weight on file to calculate BMI.  There were no vitals taken for this visit. Review of Systems: General:   Denies fever, chills, unexplained weight loss.  Optho/Auditory:   Denies visual changes, blurred vision/LOV ENT-sore throat +, Nasal Drainage +, Cough + Respiratory:   Denies SOB, DOE more than baseline levels.  Cardiovascular:   Denies chest pain, palpitations, new onset peripheral edema  Gastrointestinal:   Denies nausea, vomiting, diarrhea.  Genitourinary: Denies dysuria, freq/ urgency, flank pain or discharge from genitals.  Endocrine:     Denies hot or cold intolerance, polyuria, polydipsia. Musculoskeletal:   Denies unexplained myalgias, joint swelling, unexplained arthralgias, gait problems.  Skin:  Denies rash, suspicious lesions Neurological:     Denies dizziness, unexplained weakness, numbness  Psychiatric/Behavioral:   Denies mood changes, suicidal or  homicidal ideations, hallucinations  Observations/Objective: No acute distress noted during the telephone conversation.  Assessment and Plan: Strep Test- Neg Recommend SARS-CoV-2 testing [facility] tomorrow Follow CDC quarantine guidelines  Increase fluids/rest/OTC Acetaminophen   Follow Up Instructions: PRN   I discussed the assessment and treatment plan with the patient. The patient was provided an opportunity to ask questions and all were answered. The patient agreed with the plan and demonstrated an understanding of the instructions.   The patient was advised to call back or seek an in-person evaluation if the symptoms worsen or if the condition fails to improve as anticipated.  I provided 8 minutes of non-face-to-face time during this encounter.   Esaw Grandchild, NP

## 2019-01-04 NOTE — Telephone Encounter (Signed)
Patient left msg stating has had a sore throat since Friday 01/01/19 & either wishes for Telehealth or Rx to be called into pharmacy -- Please advise if either are possible---pt scheduled for Acute Appt w/ [facility] for 11/17 @ 4pm but will wait to hear if COVID TEST required 1st.([phone]).  --glh

## 2019-01-05 ENCOUNTER — Ambulatory Visit (INDEPENDENT_AMBULATORY_CARE_PROVIDER_SITE_OTHER): Payer: PPO | Admitting: Adult Health

## 2019-01-05 ENCOUNTER — Encounter: Payer: Self-pay | Admitting: Adult Health

## 2019-01-05 ENCOUNTER — Other Ambulatory Visit: Payer: Self-pay

## 2019-01-05 DIAGNOSIS — J029 Acute pharyngitis, unspecified: Secondary | ICD-10-CM | POA: Diagnosis not present

## 2019-01-05 LAB — POCT RAPID STREP A (OFFICE): Rapid Strep A Screen: NEGATIVE

## 2019-01-05 NOTE — Telephone Encounter (Signed)
Pt has telehealth visit today.  Will address concerns at that time.  Charyl Bigger, CMA

## 2019-01-05 NOTE — Assessment & Plan Note (Signed)
Assessment and Plan: Strep Test- Neg Recommend SARS-CoV-2 testing [facility] tomorrow Follow CDC quarantine guidelines  Increase fluids/rest/OTC Acetaminophen   Follow Up Instructions: PRN   I discussed the assessment and treatment plan with the patient. The patient was provided an opportunity to ask questions and all were answered. The patient agreed with the plan and demonstrated an understanding of the instructions.   The patient was advised to call back or seek an in-person evaluation if the symptoms worsen or if the condition fails to improve as anticipated.

## 2019-01-06 ENCOUNTER — Other Ambulatory Visit: Payer: Self-pay

## 2019-01-06 DIAGNOSIS — Z20822 Contact with and (suspected) exposure to covid-19: Secondary | ICD-10-CM

## 2019-01-08 LAB — NOVEL CORONAVIRUS, NAA: SARS-CoV-2, NAA: NOT DETECTED

## 2019-01-08 LAB — SPECIMEN STATUS REPORT

## 2019-01-12 ENCOUNTER — Other Ambulatory Visit: Payer: Self-pay | Admitting: Family Medicine

## 2019-01-12 DIAGNOSIS — E782 Mixed hyperlipidemia: Secondary | ICD-10-CM

## 2019-01-12 DIAGNOSIS — F5102 Adjustment insomnia: Secondary | ICD-10-CM

## 2019-01-12 MED ORDER — TRAZODONE HCL 100 MG PO TABS: ORAL_TABLET | ORAL | 0 refills | Status: DC

## 2019-01-18 ENCOUNTER — Encounter: Payer: Self-pay | Admitting: Podiatry

## 2019-01-18 ENCOUNTER — Ambulatory Visit: Payer: PPO | Admitting: Podiatry

## 2019-01-18 ENCOUNTER — Other Ambulatory Visit: Payer: Self-pay

## 2019-01-18 ENCOUNTER — Ambulatory Visit (INDEPENDENT_AMBULATORY_CARE_PROVIDER_SITE_OTHER): Payer: PPO

## 2019-01-18 DIAGNOSIS — M2041 Other hammer toe(s) (acquired), right foot: Secondary | ICD-10-CM | POA: Diagnosis not present

## 2019-01-18 DIAGNOSIS — M2042 Other hammer toe(s) (acquired), left foot: Secondary | ICD-10-CM | POA: Diagnosis not present

## 2019-01-18 NOTE — Progress Notes (Signed)
Subjective:   Patient ID: Shelly Hickman, female   DOB: 78 y.o.   MRN: [id number]   HPI patient presents stating she had previous surgery on her left foot and has elevated digits of the second and third toes that can become bothersome with shoe gear.  Had this done by another physician several years ago and has not been seen by me for a long time and does not smoke and likes to be active   Review of Systems  All other systems reviewed and are negative.       Objective:  Physical Exam Vitals signs and nursing note reviewed.  Constitutional:      Appearance: She is well-developed.  Pulmonary:     Effort: Pulmonary effort is normal.  Musculoskeletal: Normal range of motion.  Skin:    General: Skin is warm.  Neurological:     Mental Status: She is alert.     Neurovascular status found to be intact muscle strength adequate incisions on top of foot from previous neuroma and metatarsal surgery.  Moderate rigid contracture digit to left and mild on the left second third toe with redness of the second digit secondary to contracture.  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  At this point I applied digital pad to try to take pressure off the toe and that may be all she needs and she will be seen back as needed  X-rays indicate rigid contracture digit to left over third digit

## 2019-01-18 NOTE — Progress Notes (Signed)
2

## 2019-01-18 NOTE — Patient Instructions (Signed)
Hammer Toe  Hammer toe is a change in the shape (a deformity) of your toe. The deformity causes the middle joint of your toe to stay bent. This causes pain, especially when you are wearing shoes. Hammer toe starts gradually. At first, the toe can be straightened. Gradually over time, the deformity becomes stiff and permanent. Early treatments to keep the toe straight may relieve pain. As the deformity becomes stiff and permanent, surgery may be needed to straighten the toe. What are the causes? Hammer toe is caused by abnormal bending of the toe joint that is closest to your foot. It happens gradually over time. This pulls on the muscles and connections (tendons) of the toe joint, making them weak and stiff. It is often related to wearing shoes that are too short or narrow and do not let your toes straighten. What increases the risk? You may be at greater risk for hammer toe if you:  Are female.  Are older.  Wear shoes that are too small.  Wear high-heeled shoes that pinch your toes.  Are a ballet dancer.  Have a second toe that is longer than your big toe (first toe).  Injure your foot or toe.  Have arthritis.  Have a family history of hammer toe.  Have a nerve or muscle disorder. What are the signs or symptoms? The main symptoms of this condition are pain and deformity of the toe. The pain is worse when wearing shoes, walking, or running. Other symptoms may include:  Corns or calluses over the bent part of the toe or between the toes.  Redness and a burning feeling on the toe.  An open sore that forms on the top of the toe.  Not being able to straighten the toe. How is this diagnosed? This condition is diagnosed based on your symptoms and a physical exam. During the exam, your health care provider will try to straighten your toe to see how stiff the deformity is. You may also have tests, such as:  A blood test to check for rheumatoid arthritis.  An X-ray to show how  severe the deformity is. How is this treated? Treatment for this condition will depend on how stiff the deformity is. Surgery is often needed. However, sometimes a hammer toe can be straightened without surgery. Treatments that do not involve surgery include:  Taping the toe into a straightened position.  Using pads and cushions to protect the toe (orthotics).  Wearing shoes that provide enough room for the toes.  Doing toe-stretching exercises at home.  Taking an NSAID to reduce pain and swelling. If these treatments do not help or the toe cannot be straightened, surgery is the next option. The most common surgeries used to straighten a hammer toe include:  Arthroplasty. In this procedure, part of the joint is removed, and that allows the toe to straighten.  Fusion. In this procedure, cartilage between the two bones of the joint is taken out and the bones are fused together into one longer bone.  Implantation. In this procedure, part of the bone is removed and replaced with an implant to let the toe move again.  Flexor tendon transfer. In this procedure, the tendons that curl the toes down (flexor tendons) are repositioned. Follow these instructions at home:  Take over-the-counter and prescription medicines only as told by your health care provider.  Do toe straightening and stretching exercises as told by your health care provider.  Keep all follow-up visits as told by your health care   provider. This is important. How is this prevented?  Wear shoes that give your toes enough room and do not cause pain.  Do not wear high-heeled shoes. Contact a health care provider if:  Your pain gets worse.  Your toe becomes red or swollen.  You develop an open sore on your toe. This information is not intended to replace advice given to you by your health care provider. Make sure you discuss any questions you have with your health care provider. Document Released: 02/02/2000 Document  Revised: 01/17/2017 Document Reviewed: 05/31/2015 [employer] Patient Education  2020 [employer]  

## 2019-01-25 ENCOUNTER — Other Ambulatory Visit: Payer: Self-pay | Admitting: Family Medicine

## 2019-01-28 ENCOUNTER — Other Ambulatory Visit: Payer: Self-pay

## 2019-01-28 ENCOUNTER — Ambulatory Visit (INDEPENDENT_AMBULATORY_CARE_PROVIDER_SITE_OTHER): Payer: PPO | Admitting: Family Medicine

## 2019-01-28 ENCOUNTER — Encounter: Payer: Self-pay | Admitting: Family Medicine

## 2019-01-28 VITALS — BP 142/76 | Ht 66.0 in | Wt 235.0 lb

## 2019-01-28 DIAGNOSIS — E781 Pure hyperglyceridemia: Secondary | ICD-10-CM | POA: Diagnosis not present

## 2019-01-28 DIAGNOSIS — R208 Other disturbances of skin sensation: Secondary | ICD-10-CM | POA: Diagnosis not present

## 2019-01-28 DIAGNOSIS — I10 Essential (primary) hypertension: Secondary | ICD-10-CM

## 2019-01-28 DIAGNOSIS — E039 Hypothyroidism, unspecified: Secondary | ICD-10-CM | POA: Diagnosis not present

## 2019-01-28 DIAGNOSIS — F5102 Adjustment insomnia: Secondary | ICD-10-CM | POA: Diagnosis not present

## 2019-01-28 DIAGNOSIS — H8110 Benign paroxysmal vertigo, unspecified ear: Secondary | ICD-10-CM

## 2019-01-28 DIAGNOSIS — E785 Hyperlipidemia, unspecified: Secondary | ICD-10-CM

## 2019-01-28 DIAGNOSIS — R7303 Prediabetes: Secondary | ICD-10-CM | POA: Diagnosis not present

## 2019-01-28 DIAGNOSIS — E038 Other specified hypothyroidism: Secondary | ICD-10-CM

## 2019-01-28 DIAGNOSIS — J324 Chronic pansinusitis: Secondary | ICD-10-CM

## 2019-01-28 DIAGNOSIS — E786 Lipoprotein deficiency: Secondary | ICD-10-CM

## 2019-01-28 DIAGNOSIS — F339 Major depressive disorder, recurrent, unspecified: Secondary | ICD-10-CM

## 2019-01-28 DIAGNOSIS — N3946 Mixed incontinence: Secondary | ICD-10-CM | POA: Diagnosis not present

## 2019-01-28 MED ORDER — FLUTICASONE PROPIONATE 50 MCG/ACT NA SUSP: NASAL | 5 refills | Status: DC

## 2019-01-28 MED ORDER — TRAZODONE HCL 100 MG PO TABS: ORAL_TABLET | ORAL | 1 refills | Status: DC

## 2019-01-28 MED ORDER — FEXOFENADINE HCL 180 MG PO TABS: 180.0000 mg | ORAL_TABLET | Freq: Every day | ORAL | 1 refills | Status: DC

## 2019-01-28 MED ORDER — DULOXETINE HCL 60 MG PO CPEP: ORAL_CAPSULE | ORAL | 0 refills | Status: DC

## 2019-01-28 MED ORDER — AMLODIPINE BESYLATE 5 MG PO TABS: 5.0000 mg | ORAL_TABLET | Freq: Every day | ORAL | 0 refills | Status: DC

## 2019-01-28 NOTE — Patient Instructions (Signed)
How to Treat Vertigo at Home with Exercises  What is Vertigo?  Vertigo is a relatively common symptom most often associated with conditions such as sinusitis (inflammation of your sinuses due to viruses, allergies, or bacterial infections), or an inner ear infection or ear trauma.   It can be brought on by trauma (e.g. a blow to the head or whiplash) or more serious things like minor strokes.   Symptoms can also be brought on by normal degenerative changes to your inner ear that occur with aging.  The condition tends to be more commonly seen in the elderly but it can occur in all ages.    Patients most often complain of dizziness, as if the room is spinning around them.   Symptoms are provoked by quick head movements or changes in position like going from standing to lying in bed, or even turning over in bed.   It may present with nausea and/or vomiting, and can be very debilitating to some folks.    By far the most common cause, known as Benign Paroxysmal Positional Vertigo (BPPV), is categorized by a sudden onset of symptoms, that are intense but short-lived (60 seconds or less), which is triggered by a change in head position.   Symptoms usually dissipate if you stay in one position and do not move your head.   Within the inner ear are collections of calcium carbonate crystals referred to as "otoliths" which may become dislodged from their normal position and migrate into the semicircular canals of the inner ear, throwing off your body's ability to sense where you are in space.     Fig. 921 Anatomy of the Right Osseous Labyrinth. Antonieta Iba. Anatomy of the Human Body. 1918.            What Else Could Be Behind My Vertigo?  Some other causes of vertigo include:  Meniere's disease (disorder of inner ear with ringing in ears, feeling of fullness/pressure within ear, and fluctuating hearing loss) Tumours Neurological disorders e.g. Multiple Sclerosis Motion Sickness (lack of coordination  between visual stimuli, inner ear balance and positional sense) Migraine Labyrinthitis (inflammation of the fluid-filled tubes and sacs within the inner ear; may also be associated with changes in hearing) Vestibular neuritis (inflammation of the nerves associated with transmission of sensory info from the inner ear; usually of viral origins)  How it can be treated/cured? While certain medications have been prescribed for vertigo including Lorazepam your doing well 7 house the house going organizing and getting things ready for sale with the and Meclizine (for motion sickness), there exists no evidence to support a recommendation of any medication in the routine treatment of BPPV.  Clinical trials have demonstrated that repositioning techniques (listed below) are a superior option for management Otis Dials et al., 2008).    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 For right ear BPPV, the procedure has to be performed in the opposite direction, starting with the head turned to the right side.  1. Start by sitting on a bed with your head turned 45 to the left. Place a pillow behind you so that on lying back it will be under your shoulders.  2. Lie back quickly with shoulders on the pillow, neck extended, and head resting on the bed. In this position, the affected (left) ear is underneath. Wait for 30 secondS.  3. Turn your head 90 to the right (without raising it), and  wait again for 30 seconds.  ?4. Turn your body and head another 90? to the right, and wait for another 30 seconds.  ?5. Sit up on the right side. This maneuver should be performed three times a day. Repeat this daily until you are free from positional vertigo for 24 hours. ? ? (B) Instructions for the modified Semont maneuver (MSM) for left ear PC-BPPV. For right ear BPPV, the maneuver has to be performed in the opposite direction,  starting with the head turned toward the left ear.  ?1. Sit upright on a bed with your head turned 45? toward the right ear.  ?2. Drop quickly to the left side, so that your head touches the bed behind your left ear. Wait 30 seconds.  ?3. Move head and trunk in a swift movement toward the other side without stopping in the upright position, so that your head comes to rest on the right side of your forehead. Wait again for 30 seconds.  ?4. Sit up again.  ?This maneuver should be performed three times a day. Repeat this daily until you are free from positional vertigo symptoms for 24 hours.  ? ?(   See the video in the supplementary material on the [employer]rology[employer] site; go to http://www.neurology.org/content/[email or web address]/150/F1.expansion.html   ) ? ? ? ? ?You can also try this motion at home as well- ?Self-Treatment of Benign Paroxysmal Positional Vertigo ?Benign Paroxysmal Positioning Vertigo is caused by loose inner ear crystals in the inner ear that migrate while sleeping to the back-bottom inner ear balance canal, the so-called ?posterior semi-circular canal.? The maneuver demonstrated below is the way to reposition the loose crystals so that the symptoms caused by the loose crystals go away. You may have a floating, swaying sense while walking or sitting for a few days after this procedure.  ? ? ? ? ? ? ? ? ? ? ?

## 2019-01-28 NOTE — Progress Notes (Signed)
Telehealth office visit note for Shelly Hickman, D.O- at Primary Care at [facility]   I connected with current patient today and verified that I am speaking with the correct person using two identifiers.   . Location of the patient: Home . Location of the provider: Office Only the patient (+/- their family members at pt's discretion) and myself were participating in the encounter - This visit type was conducted due to national recommendations for restrictions regarding the COVID-19 Pandemic (e.g. social distancing) in an effort to limit this patient's exposure and mitigate transmission in our community.  This format is felt to be most appropriate for this patient at this time.   - The patient did not have access to video technology or had technical difficulties with video requiring transitioning to audio format only. - No physical exam could be performed with this format, beyond that communicated to [place] by the patient/ family members as noted.   - Additionally my office staff/ schedulers discussed with the patient that there may be a monetary charge related to this service, depending on their medical insurance.   The patient expressed understanding, and agreed to proceed.       History of Present Illness: I, Toni Amend, am serving as scribe for Dr. Mellody Hickman.   Notes doing well today.  She enjoyed speaking with CMA to arrange appointment today.  Says she has been good overall.  She misses coming into the office in-person; "I hope we will see each other soon."   Notes that her daughter and granddaughter flew in from [place] to visit for a week over Thanksgiving.  Notes "we may or may not see them for Christmas."  Says "so far so good, nobody's been sick or anything."   - Mood Management Managed on Cymbalta.  Stable at this time.  She continues to feel well.  States "I've had lots going on for the last twelve months, I guess, now;" notes "glad that I had some chores  to do and got those done."  Says "everything's good" overall.  Notes concerns regarding her husband.  States he is not doing as well health-wise as he has done in the past.  He cannot move around very well and recently obtained an MRI on his hip.  Notes recently obtained assistance through [facility] thanks to the recommendation of her daughter.  Patient reports that this was especially helpful recently as her husband experienced some difficulties with his bowels.  States that her husband is "having a hard time of it."  He sometimes goes to [employer] or to the [facility] to exercise, but is limited by pain in his hip.  Patient states she has not been out to exercise since COVID started.   - Recent Incident of Vertigo After Resuming Trazodone & Experiencing Sinus Congestion Notes had an incident last night.  She sat down in her TV chair and went to sleep.  When she woke up, she started walking down the hall and "got down the hall fine."  At that point, she looked out the front window, then turned around to go into her bedroom door, and immediately got a little dizzy.  Notes "I never fell," but indicates she braced herself against the wall and "kept thinking I'm gonna fall"   Says she then got halfway back down the hallway, and had to pause at another doorway to regain her balance.  States this incident "did scare me."   Her daughter is trained  as a Software engineer, and patient consulted with her daughter regarding the incident.  Thought she should mention it today.  The patient confirms that this entire episode lasted around 30 seconds; "not more than that."  She confirms she woke up from sleep in the middle of the night, and the episode was initiated when the patient turned her head to look through a window, and then turned her head quickly again to look back.  Patient further notes that she typically takes trazodone to help her sleep.  Prior to this incident, she had been without trazodone for 3-4 days, and had  recently obtained "a couple from the pharmacist to tide me over until appointment today."  Says she wonders if the episode of dizziness maybe occurred due to the restarting trazodone.   Confirms she has been more congested lately in her sinuses, and around the time of the incident, notes she "spat out a bunch of yellowish stuff" and "my mouth just felt full of it."     She does not currently take anything regularly for sinus congestion.    States she has tried allergy pills OTC before in the past.   - Hypothyroidism Continues taking her medication daily.  Notes "I can't tell a difference in how I feel."  Patient indicates she is asymptomatic at this time.   - Burning sensation in upper and lower extremities Per patient, symptoms are stable at this time.  She is tolerating Neurontin well, with no concerns or side-effects.   - Stress and urinary incontinence, insomnia She continues treatment plan on oxybutynin as prescribed.  Says there were two nights recently where she could hardly sleep at all, and notes getting up every 30-45 minutes for "what felt like a dribble of urine."   Notes she has resumed trazodone the last two nights and "had the most wonderful sleep she's had in a long time."  Notes she typically takes one trazodone per night, but ran out of her prescription recently, which caused her to temporarily discontinue use.   HPI:  Hypertension:  -  Her blood pressure at home has been running: 142/76 last night, but this measurement was obtained under very unusual circumstances.  Notes she tried two wrist cuffs to obtain a reading to no avail, and ended up leaving the house to visit a drug store to obtain a reading.  In addition to this, she had not taken her blood pressure medicines prior.    Notes her BP usually runs around 120/70 at home.  - Patient reports good compliance with medication and/or lifestyle modification  - Her denies acute concerns or problems related to  treatment plan  - She denies new onset of: chest pain, exercise intolerance, shortness of breath, dizziness (aside from recent acute episode of vertigo), visual changes, headache, lower extremity swelling or claudication.    Last 3 blood pressure readings in our office are as follows: BP Readings from Last 3 Encounters:  01/28/19 (!) 142/76  10/15/18 121/78  04/22/18 116/74   Filed Weights   01/28/19 1347  Weight: 235 lb (106.6 kg)     HPI:  Hyperlipidemia:  78 y.o. female here for cholesterol follow-up.   - Patient reports good compliance with treatment plan of:  medication and/ or lifestyle management.    - Patient denies any acute concerns or problems with management plan   - She denies new onset of: myalgias, arthralgias, increased fatigue more than normal, chest pains, exercise intolerance, shortness of breath, dizziness, visual changes, headache,  lower extremity swelling or claudication.   Most recent cholesterol panel was:  Lab Results  Component Value Date   CHOL 146 10/15/2018   HDL 51 10/15/2018   LDLCALC 80 10/15/2018   TRIG 76 10/15/2018   CHOLHDL 2.9 10/15/2018   Hepatic Function Latest Ref Rng & Units 02/02/2018 12/12/2017 10/04/2016  Total Protein 6.0 - 8.5 g/dL - 6.9 6.7  Albumin 3.5 - 4.8 g/dL - 4.3 3.9  AST 0 - 40 IU/L - 25 20  ALT 0 - 32 IU/L 16 18 17   Alk Phosphatase 39 - 117 IU/L - 66 85  Total Bilirubin 0.0 - 1.2 mg/dL - 0.5 0.2        GAD 7 : Generalized Anxiety Score 09/16/2018 04/22/2018 10/04/2016  Nervous, Anxious, on Edge 0 0 1  Control/stop worrying 1 2 1   Worry too much - different things 0 2 1  Trouble relaxing 0 0 0  Restless 0 0 1  Easily annoyed or irritable 1 1 2   Afraid - awful might happen 1 0 1  Total GAD 7 Score 3 5 7   Anxiety Difficulty - - Somewhat difficult    Depression screen [facility] 2/9 01/28/2019 10/15/2018 09/16/2018 04/22/2018 12/17/2017  Decreased Interest 0 1 0 1 0  Down, Depressed, Hopeless 0 1 0 0 0  PHQ - 2 Score 0  2 0 1 0  Altered sleeping 1 1 0 0 1  Tired, decreased energy 1 1 1  0 1  Change in appetite 1 1 0 1 1  Feeling bad or failure about yourself  0 1 0 0 0  Trouble concentrating 0 0 0 0 0  Moving slowly or fidgety/restless 0 0 0 0 0  Suicidal thoughts 0 0 0 0 0  PHQ-9 Score 3 6 1 2 3   Difficult doing work/chores Not difficult at all Somewhat difficult - - Not difficult at all  Some recent data might be hidden      Impression and Recommendations:    1. Depression, recurrent ([place])   2. Adjustment insomnia   3. Prediabetes   4. Morbid obesity ([place])   5. Essential hypertension, benign   6. Hyperlipidemia, unspecified hyperlipidemia type   7. Hypertriglyceridemia   8. Low HDL (under 40)   9. Subclinical hypothyroidism-with symptoms of fatigue, hair loss etc.   10. Burning sensation of lower and upper extremity   11. Mixed stress and urge urinary incontinence   12. acute on Chronic pansinusitis   13. Benign paroxysmal positional vertigo, unspecified laterality      - Last OV 09/16/2018.  Acute on Chronic Pansinusitis, Benign Paroxysmal Positional Vertigo - Extensively reviewed patient's symptoms today. - Discussed possible causes of acute dizziness/vertigo.  All questions answered.  - If patient's dizziness continues, lasts for longer periods, or becomes more frequent, discussed that this is not normal and would require further attention.  Otherwise, advised patient that her symptoms most likely represent benign positional vertigo based on her history of events.   - In addition to this, reviewed need to control sinus congestion!  - Prescription Allegra provided today, along with nasal spray.  See med list.  - Advised the patient to begin using AYR or Neilmed sinus rinses BID followed by nasal spray BID (one spray to each nostril).  - Educational handout on vertigo provided today. - Patient agrees to follow instructions and exercises within handout.  - Encouraged patient to  engage in these strategies and exercises for the next 5 days, 3 days beyond  when symptoms are completely resolved.  - Will continue to monitor.  The patient was advised to call back or seek an in-person evaluation if the symptoms worsen or if the condition fails to improve as anticipated.   Subclinical hypothyroidism- with/o current symptoms of fatigue, hair loss etc. - Stable at this time on current management. - Patient asymptomatic at this time. - Continue treatment plan as established.  See med list. - Will continue to monitor and re-check as discussed.   Depression, Recurrent - Symptoms stable at this time. - Managed successfully on Cymbalta. - Continue treatment plan as established.  See med list. - Will continue to monitor.   Mixed stress and urge urinary incontinence - Stable while managed on oxybutynin daily. - Continue treatment plan as established.  See med list. - Per patient, recently experienced insomnia while out of trazodone over the past couple of nights.  During these nights, she kept getting up to urinate very frequently; but when the patient resumed trazodone over the last two evenings, she was able to sleep very well with no concerns. - Will continue to monitor.   Adjustment Insomnia - Well-managed on trazodone. - Per patient, recently ran out of prescription, causing insomnia to worsen.  However, when she resumed trazodone use, her sleep significantly improved. - Patient to continue trazodone management as established.  See med list. - Will continue to monitor.   Burning sensation of lower and upper extremity - Stable at this time on current management. - Managed on Neurontin.  Patient tolerating well without S-E. - Continue treatment plan as established. - Will continue to monitor.   Essential Hypertension - Per patient, BP stable at home. - Patient will continue current treatment regimen.  See med list.  - Counseled patient on pathophysiology of  disease and discussed various treatment options, which always includes dietary and lifestyle modification as first line.   - Lifestyle changes such as dash and heart healthy diets and engaging in a regular exercise program discussed extensively with patient.   - Ambulatory blood pressure monitoring encouraged at least 3 times weekly.  Keep log and bring in every office visit.  Reminded patient that if they ever feel poorly in any way, to check their blood pressure and pulse.  - Handouts provided at patient's desire and/or told to go online at the [facility] website for further information  - We will continue to monitor   Hyperlipidemia, Hypertriglyceridemia, Low HDL - Last FLP obtained 10/15/2018, much improved from prior. - LDL = down to 80 from 110 prior. - Triglycerides = down to 76 from 174 prior - HDL = 51, up from 43 prior.  - Reviewed that patient's cholesterol levels are currently at goal. - Pt will continue current treatment regimen.  See med list.  - Dietary changes such as low saturated & trans fat diets for hyperlipidemia and low carb diets for hypertriglyceridemia discussed with patient.    - Encouraged patient to follow AHA guidelines for regular exercise and also engage in weight loss if BMI above 25.   - We will continue to monitor   Prediabetes - Last A1c was 6.0 six months ago, stable from 6.0 prior. - Reviewed goal of preventing diabetes with ongoing prudent dietary changes/ wt mgt.  - Counseled patient on prevention of diabetes and discussed dietary and lifestyle modification as first line.    - Importance of low carb, heart-healthy diet discussed with patient in addition to regular aerobic exercise of 51mn 5d/week or  more.   - We will continue to monitor and re-check as discussed.   Recommendations - Discussed need for updated Medicare Wellness near future. - Due in March for thyroid, Vitamin D, fasting labs.   - As part of my medical  decision making, I reviewed the following data within the [place] History obtained from pt /family, CMA notes reviewed and incorporated if applicable, Labs reviewed, Radiograph/ tests reviewed if applicable and OV notes from prior OV's with me, as well as other specialists she/he has seen since seeing me last, were all reviewed and used in my medical decision making process today.    - Additionally, discussion had with patient regarding our treatment plan, and their biases/concerns about that plan were used in my medical decision making today.    - The patient agreed with the plan and demonstrated an understanding of the instructions.   No barriers to understanding were identified.    - Red flag symptoms and signs discussed in detail.  Patient expressed understanding regarding what to do in case of emergency\ urgent symptoms.     Return for Medicare Wellness near future, due March for fasting labs (thyroid, Vit D, etc.).    Meds ordered this encounter  Medications  . DULoxetine (CYMBALTA) 60 MG capsule    Sig: TAKE 2 CAPSULES BY MOUTH DAILY BEFORE BREAKFAST.    Dispense:  90 capsule    Refill:  0  . amLODipine (NORVASC) 5 MG tablet    Sig: Take 1 tablet (5 mg total) by mouth daily.    Dispense:  90 tablet    Refill:  0  . traZODone (DESYREL) 100 MG tablet    Sig: TAKE 1 TABLET BY MOUTH EACH NIGHT AT BEDTIME    Dispense:  90 tablet    Refill:  1  . fexofenadine (ALLEGRA) 180 MG tablet    Sig: Take 1 tablet (180 mg total) by mouth daily.    Dispense:  90 tablet    Refill:  1  . fluticasone (FLONASE) 50 MCG/ACT nasal spray    Sig: 1 spray each nostril following sinus rinses twice daily    Dispense:  16 g    Refill:  5    Medications Discontinued During This Encounter  Medication Reason  . amLODipine (NORVASC) 5 MG tablet Reorder  . DULoxetine (CYMBALTA) 60 MG capsule Reorder  . traZODone (DESYREL) 100 MG tablet Reorder     I provided 24+ minutes of non  face-to-face time during this encounter.  Additional time was spent with charting and coordination of care after the actual visit commenced.   Note:  This note was prepared with assistance of Dragon voice recognition software. Occasional wrong-word or sound-a-like substitutions may have occurred due to the inherent limitations of voice recognition software.  This document serves as a record of services personally performed by Shelly Dance, DO. It was created on her behalf by Toni Amend, a trained medical scribe. The creation of this record is based on the scribe's personal observations and the provider's statements to them.   This case required medical decision making of at least moderate complexity. The above documentation has been reviewed to be accurate and was completed by Marjory Sneddon, D.O.       Patient Care Team    Relationship Specialty Notifications Start End  Shelly Dance, DO PCP - General Family Medicine  08/30/15   Almedia Balls, MD Consulting Physician Orthopedic Surgery  10/18/15   Kristeen Miss, MD Consulting Physician  Neurosurgery  10/18/15    Comment: spine sx  Allyn Kenner, MD Consulting Physician Dermatology  10/18/15    Comment: skin screening   Elsie Saas, MD Consulting Physician Orthopedic Surgery  02/20/16   [facility] [place], MD Attending Physician   10/17/16      -Vitals obtained; medications/ allergies reconciled;  personal medical, social, Sx etc.histories were updated by CMA, reviewed by me and are reflected in chart   Patient Active Problem List   Diagnosis Date Noted  . Bruit of left carotid artery 09/11/2017  . Morbid obesity ([place]) 09/09/2015  . Prediabetes 09/06/2015  . Hypertriglyceridemia 09/06/2015  . HLD (hyperlipidemia) 08/30/2015  . Essential hypertension, benign 07/14/2013  . Anxiety 07/14/2013  . Insomnia 07/14/2013  . Depression, recurrent ([place]) 03/26/2011  . Low HDL (under 40) 09/06/2015  . Incontinence of  urine 09/05/2013  . GERD (gastroesophageal reflux disease) 12/10/2012  . Spinal stenosis of lumbar region 10/18/2015  . acute on Chronic pansinusitis 01/28/2019  . Sore throat 01/05/2019  . Onychomycosis of toenail 10/15/2018  . Hammer toe of left foot 10/15/2018  . Spasm of back muscles-  left greater than right and lumbar region 10/15/2018  . Bilateral low back pain without sciatica 10/15/2018  . Neck enlargement- left lateral neck in supraclavicular region- unknkown cause 04/22/2018  . Subclinical hypothyroidism-with symptoms of fatigue, hair loss etc. 12/17/2017  . Burning sensation of lower extremity 12/17/2017  . 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 Irritable bowel syndrome with both constipation and diarrhea 06/24/2017  . Claw toe, acquired, left 05/29/2017  . Thrush, oral 05/01/2017  . Morton neuroma, left 01/20/2017  . Pain in left foot 01/20/2017  . Sialoadenitis- submandibular gland 12/10/2016  . Pre-op evaluation 05/30/2016  . 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 Encounter for wellness examination 12/31/2015  . Primary localized osteoarthritis of right knee 12/12/2015  . Spinal stenosis in cervical region 10/18/2015  . Memory impairment 08/30/2015  . Vitamin D deficiency 08/30/2015  . Status post left hip replacement 07/14/2013  . Constipation 07/14/2013  . Allergic rhinitis 07/14/2013  . Osteopenia 06/27/2011  . Lacrimal canalicular stenosis 46/65/9935  . Degenerative joint disease involving multiple joints 04/06/2010     Current Meds  Medication Sig  . acetaminophen (TYLENOL) 325 MG tablet Take 2 tablets (650 mg total) by mouth every 6 (six) hours as needed for mild pain (or Fever >/= 101).  Marland Kitchen amLODipine (NORVASC) 5 MG tablet Take 1 tablet (5 mg total) by mouth daily.  Marland Kitchen aspirin EC 81 MG tablet Take 81 mg by mouth daily.  . Calcium Carb-Cholecalciferol (CALCIUM 600-D PO) Take 1 tablet by mouth daily.  . DULoxetine (CYMBALTA) 60 MG capsule TAKE 2  CAPSULES BY MOUTH DAILY BEFORE BREAKFAST.  Marland Kitchen gabapentin (NEURONTIN) 300 MG capsule TAKE 3 CAPSULES BY MOUTH 3 TIMES DAILY  . hydrochlorothiazide (HYDRODIURIL) 25 MG tablet Take 1 tablet (25 mg total) by mouth daily. **PATIENT NEEDS APT FOR ANY FURTHER REFILLS**  . levothyroxine (SYNTHROID) 25 MCG tablet TAKE 1 TABLET BY MOUTH DAILY  . losartan (COZAAR) 100 MG tablet Take 1 tablet (100 mg total) by mouth daily. **PATIENT NEEDS APT FOR ANY FURTHER REFILLS**  . niacin (NIASPAN) 1000 MG CR tablet Take 1 tablet (1,000 mg total) by mouth at bedtime.  Marland Kitchen omega-3 acid ethyl esters (LOVAZA) 1 g capsule 2 caps twice daily  . oxybutynin (DITROPAN-XL) 10 MG 24 hr tablet Take 1 tablet (10 mg total) by mouth at bedtime.  . simvastatin (ZOCOR) 20  MG tablet Take 1 tablet (20 mg total) by mouth at bedtime. **PATIENT NEEDS APT**  . traZODone (DESYREL) 100 MG tablet TAKE 1 TABLET BY MOUTH EACH NIGHT AT BEDTIME  . [DISCONTINUED] amLODipine (NORVASC) 5 MG tablet TAKE 1 TABLET BY MOUTH DAILY  . [DISCONTINUED] DULoxetine (CYMBALTA) 60 MG capsule TAKE 2 CAPSULES BY MOUTH DAILY BEFORE BREAKFAST.  OFFICE VISIT REQUIRED PRIOR TO ANY FURTHER REFILLS  . [DISCONTINUED] traZODone (DESYREL) 100 MG tablet TAKE 1 TABLET BY MOUTH EACH NIGHT AT BEDTIME  *PATIENT NEEDS APPT- no more RF*     Allergies:  Allergies  Allergen Reactions  . Avelox [Moxifloxacin Hcl In Nacl] Other (See Comments)    CHEST PAIN OF UNSPECIFIED ORIGIN  . Cephalosporins Hives and Itching  . Other Itching and Swelling    UNSPECIFIED INSECT STINGS     ROS:  See above HPI for pertinent positives and negatives   Objective:   Blood pressure (!) 142/76, height 5' 6"  (1.676 m), weight 235 lb (106.6 kg).  (if some vitals are omitted, this means that patient was UNABLE to obtain them even though they were asked to get them prior to OV today.  They were asked to call us at their earliest convenience with these once obtained. )  General: A & O * 3; sounds  in no acute distress; in usual state of health.  Skin: Pt confirms warm and dry extremities and pink fingertips HEENT: Pt confirms lips non-cyanotic Chest: Patient confirms normal chest excursion and movement Respiratory: speaking in full sentences, no conversational dyspnea; patient confirms no use of accessory muscles Psych: insight appears good, mood- appears full

## 2019-02-16 ENCOUNTER — Other Ambulatory Visit: Payer: Self-pay | Admitting: Family Medicine

## 2019-02-16 DIAGNOSIS — E782 Mixed hyperlipidemia: Secondary | ICD-10-CM

## 2019-02-22 ENCOUNTER — Other Ambulatory Visit: Payer: Self-pay | Admitting: Family Medicine

## 2019-02-25 ENCOUNTER — Other Ambulatory Visit: Payer: Self-pay | Admitting: Family Medicine

## 2019-03-08 ENCOUNTER — Other Ambulatory Visit: Payer: Self-pay | Admitting: Family Medicine

## 2019-03-13 ENCOUNTER — Ambulatory Visit: Payer: PPO | Attending: Internal Medicine

## 2019-03-13 DIAGNOSIS — Z23 Encounter for immunization: Secondary | ICD-10-CM | POA: Insufficient documentation

## 2019-03-13 NOTE — Progress Notes (Signed)
   Covid-19 Vaccination Clinic  Name:  Shelly Hickman    MRN: [id number] DOB: Aug 10, 1940  03/13/2019  Shelly Hickman was observed post Covid-19 immunization for 30 minutes based on pre-vaccination screening without incidence. She was provided with Vaccine Information Sheet and instruction to access the V-Safe system.   Shelly Hickman was instructed to call 911 with any severe reactions post vaccine: Marland Kitchen Difficulty breathing  . Swelling of your face and throat  . A fast heartbeat  . A bad rash all over your body  . Dizziness and weakness    Immunizations Administered    Name Date Dose VIS Date Route   Pfizer COVID-19 Vaccine 03/13/2019 11:58 AM 0.3 mL 01/29/2019 Intramuscular   Manufacturer: [place]   Lot: [id number]   [place]: [id number]

## 2019-03-18 ENCOUNTER — Other Ambulatory Visit: Payer: Self-pay | Admitting: Family Medicine

## 2019-03-25 ENCOUNTER — Other Ambulatory Visit: Payer: Self-pay | Admitting: Family Medicine

## 2019-03-30 ENCOUNTER — Other Ambulatory Visit: Payer: Self-pay | Admitting: Family Medicine

## 2019-03-30 DIAGNOSIS — E039 Hypothyroidism, unspecified: Secondary | ICD-10-CM

## 2019-03-30 DIAGNOSIS — E038 Other specified hypothyroidism: Secondary | ICD-10-CM

## 2019-04-03 ENCOUNTER — Ambulatory Visit: Payer: PPO | Attending: Internal Medicine

## 2019-04-03 DIAGNOSIS — Z23 Encounter for immunization: Secondary | ICD-10-CM | POA: Insufficient documentation

## 2019-04-03 NOTE — Progress Notes (Signed)
   Covid-19 Vaccination Clinic  Name:  Shelly Hickman    MRN: [id number] DOB: 08/20/1940  04/03/2019  [name] was observed post Covid-19 immunization for 30 minutes based on pre-vaccination screening without incidence. She was provided with Vaccine Information Sheet and instruction to access the V-Safe system.   [name] was instructed to call 911 with any severe reactions post vaccine: Marland Kitchen Difficulty breathing  . Swelling of your face and throat  . A fast heartbeat  . A bad rash all over your body  . Dizziness and weakness    Immunizations Administered    Name Date Dose VIS Date Route   Pfizer COVID-19 Vaccine 04/03/2019 10:43 AM 0.3 mL 01/29/2019 Intramuscular   Manufacturer: [place]   Lot: [id number]   [place]: [id number]

## 2019-04-19 ENCOUNTER — Other Ambulatory Visit: Payer: Self-pay | Admitting: Family Medicine

## 2019-04-23 ENCOUNTER — Other Ambulatory Visit: Payer: Self-pay | Admitting: Family Medicine

## 2019-04-28 DIAGNOSIS — M47816 Spondylosis without myelopathy or radiculopathy, lumbar region: Secondary | ICD-10-CM | POA: Diagnosis not present

## 2019-04-28 DIAGNOSIS — M1611 Unilateral primary osteoarthritis, right hip: Secondary | ICD-10-CM | POA: Diagnosis not present

## 2019-04-28 DIAGNOSIS — M545 Low back pain: Secondary | ICD-10-CM | POA: Diagnosis not present

## 2019-05-11 ENCOUNTER — Other Ambulatory Visit: Payer: Self-pay | Admitting: Family Medicine

## 2019-05-11 DIAGNOSIS — E781 Pure hyperglyceridemia: Secondary | ICD-10-CM

## 2019-05-11 DIAGNOSIS — E782 Mixed hyperlipidemia: Secondary | ICD-10-CM

## 2019-05-17 ENCOUNTER — Other Ambulatory Visit: Payer: Self-pay

## 2019-05-17 ENCOUNTER — Encounter: Payer: Self-pay | Admitting: Family Medicine

## 2019-05-17 ENCOUNTER — Other Ambulatory Visit: Payer: Self-pay | Admitting: Family Medicine

## 2019-05-17 ENCOUNTER — Other Ambulatory Visit: Payer: PPO

## 2019-05-17 ENCOUNTER — Ambulatory Visit (INDEPENDENT_AMBULATORY_CARE_PROVIDER_SITE_OTHER): Payer: PPO | Admitting: Family Medicine

## 2019-05-17 VITALS — Ht 66.0 in | Wt 245.0 lb

## 2019-05-17 DIAGNOSIS — E782 Mixed hyperlipidemia: Secondary | ICD-10-CM | POA: Diagnosis not present

## 2019-05-17 DIAGNOSIS — E559 Vitamin D deficiency, unspecified: Secondary | ICD-10-CM

## 2019-05-17 DIAGNOSIS — I10 Essential (primary) hypertension: Secondary | ICD-10-CM

## 2019-05-17 DIAGNOSIS — E781 Pure hyperglyceridemia: Secondary | ICD-10-CM

## 2019-05-17 DIAGNOSIS — E785 Hyperlipidemia, unspecified: Secondary | ICD-10-CM | POA: Diagnosis not present

## 2019-05-17 DIAGNOSIS — R7303 Prediabetes: Secondary | ICD-10-CM | POA: Diagnosis not present

## 2019-05-17 DIAGNOSIS — E2839 Other primary ovarian failure: Secondary | ICD-10-CM

## 2019-05-17 DIAGNOSIS — Z780 Asymptomatic menopausal state: Secondary | ICD-10-CM | POA: Diagnosis not present

## 2019-05-17 DIAGNOSIS — Z0000 Encounter for general adult medical examination without abnormal findings: Secondary | ICD-10-CM | POA: Diagnosis not present

## 2019-05-17 DIAGNOSIS — E786 Lipoprotein deficiency: Secondary | ICD-10-CM | POA: Diagnosis not present

## 2019-05-17 MED ORDER — AMLODIPINE BESYLATE 5 MG PO TABS: 5.0000 mg | ORAL_TABLET | Freq: Every day | ORAL | 0 refills | Status: DC

## 2019-05-17 MED ORDER — SIMVASTATIN 20 MG PO TABS: 20.0000 mg | ORAL_TABLET | Freq: Every day | ORAL | 0 refills | Status: DC

## 2019-05-17 NOTE — Patient Instructions (Signed)
Preventive Care for Adults, Female  A healthy lifestyle and preventive care can promote health and wellness. Preventive health guidelines for women include the following key practices.   A routine yearly physical is a good way to check with your health care provider about your health and preventive screening. It is a chance to share any concerns and updates on your health and to receive a thorough exam.   Visit your dentist for a routine exam and preventive care every 6 months. Brush your teeth twice a day and floss once a day. Good oral hygiene prevents tooth decay and gum disease.   The frequency of eye exams is based on your age, health, family medical history, use of contact lenses, and other factors. Follow your health care provider's recommendations for frequency of eye exams.   Eat a healthy diet. Foods like vegetables, fruits, whole grains, low-fat dairy products, and lean protein foods contain the nutrients you need without too many calories. Decrease your intake of foods high in solid fats, added sugars, and salt. Eat the right amount of calories for you.Get information about a proper diet from your health care provider, if necessary.   Regular physical exercise is one of the most important things you can do for your health. Most adults should get at least 150 minutes of moderate-intensity exercise (any activity that increases your heart rate and causes you to sweat) each week. In addition, most adults need muscle-strengthening exercises on 2 or more days a week.   Maintain a healthy weight. The body mass index (BMI) is a screening tool to identify possible weight problems. It provides an estimate of body fat based on height and weight. Your health care provider can find your BMI, and can help you achieve or maintain a healthy weight.For adults 20 years and older:   - A BMI below 18.5 is considered underweight.   - A BMI of 18.5 to 24.9 is normal.   - A BMI of 25 to 29.9 is  considered overweight.   - A BMI of 30 and above is considered obese.   Maintain normal blood lipids and cholesterol levels by exercising and minimizing your intake of trans and saturated fats.  Eat a balanced diet with plenty of fruit and vegetables. Blood tests for lipids and cholesterol should begin at age 20 and be repeated every 5 years minimum.  If your lipid or cholesterol levels are high, you are over 40, or you are at high risk for heart disease, you may need your cholesterol levels checked more frequently.Ongoing high lipid and cholesterol levels should be treated with medicines if diet and exercise are not working.   If you smoke, find out from your health care provider how to quit. If you do not use tobacco, do not start.   Lung cancer screening is recommended for adults aged 55-80 years who are at high risk for developing lung cancer because of a history of smoking. A yearly low-dose CT scan of the lungs is recommended for people who have at least a 30-pack-year history of smoking and are a current smoker or have quit within the past 15 years. A pack year of smoking is smoking an average of 1 pack of cigarettes a day for 1 year (for example: 1 pack a day for 30 years or 2 packs a day for 15 years). Yearly screening should continue until the smoker has stopped smoking for at least 15 years. Yearly screening should be stopped for people who develop a   health problem that would prevent them from having lung cancer treatment.   If you are pregnant, do not drink alcohol. If you are breastfeeding, be very cautious about drinking alcohol. If you are not pregnant and choose to drink alcohol, do not have more than 1 drink per day. One drink is considered to be 12 ounces (355 mL) of beer, 5 ounces (148 mL) of wine, or 1.5 ounces (44 mL) of liquor.   Avoid use of street drugs. Do not share needles with anyone. Ask for help if you need support or instructions about stopping the use of  drugs.   High blood pressure causes heart disease and increases the risk of stroke. Your blood pressure should be checked at least yearly.  Ongoing high blood pressure should be treated with medicines if weight loss and exercise do not work.   If you are 69-55 years old, ask your health care provider if you should take aspirin to prevent strokes.   Diabetes screening involves taking a blood sample to check your fasting blood sugar level. This should be done once every 3 years, after age 38, if you are within normal weight and without risk factors for diabetes. Testing should be considered at a younger age or be carried out more frequently if you are overweight and have at least 1 risk factor for diabetes.   Breast cancer screening is essential preventive care for women. You should practice "breast self-awareness."  This means understanding the normal appearance and feel of your breasts and may include breast self-examination.  Any changes detected, no matter how small, should be reported to a health care provider.  Women in their 80s and 30s should have a clinical breast exam (CBE) by a health care provider as part of a regular health exam every 1 to 3 years.  After age 66, women should have a CBE every year.  Starting at age 1, women should consider having a mammogram (breast X-ray test) every year.  Women who have a family history of breast cancer should talk to their health care provider about genetic screening.  Women at a high risk of breast cancer should talk to their health care providers about having an MRI and a mammogram every year.   -Breast cancer gene (BRCA)-related cancer risk assessment is recommended for women who have family members with BRCA-related cancers. BRCA-related cancers include breast, ovarian, tubal, and peritoneal cancers. Having family members with these cancers may be associated with an increased risk for harmful changes (mutations) in the breast cancer genes BRCA1 and  BRCA2. Results of the assessment will determine the need for genetic counseling and BRCA1 and BRCA2 testing.   The Pap test is a screening test for cervical cancer. A Pap test can show cell changes on the cervix that might become cervical cancer if left untreated. A Pap test is a procedure in which cells are obtained and examined from the lower end of the uterus (cervix).   - Women should have a Pap test starting at age 57.   - Between ages 90 and 70, Pap tests should be repeated every 2 years.   - Beginning at age 63, you should have a Pap test every 3 years as long as the past 3 Pap tests have been normal.   - Some women have medical problems that increase the chance of getting cervical cancer. Talk to your health care provider about these problems. It is especially important to talk to your health care provider if a  new problem develops soon after your last Pap test. In these cases, your health care provider may recommend more frequent screening and Pap tests.   - The above recommendations are the same for women who have or have not gotten the vaccine for human papillomavirus (HPV).   - If you had a hysterectomy for a problem that was not cancer or a condition that could lead to cancer, then you no longer need Pap tests. Even if you no longer need a Pap test, a regular exam is a good idea to make sure no other problems are starting.   - If you are between ages 36 and 66 years, and you have had normal Pap tests going back 10 years, you no longer need Pap tests. Even if you no longer need a Pap test, a regular exam is a good idea to make sure no other problems are starting.   - If you have had past treatment for cervical cancer or a condition that could lead to cancer, you need Pap tests and screening for cancer for at least 20 years after your treatment.   - If Pap tests have been discontinued, risk factors (such as a new sexual partner) need to be reassessed to determine if screening should  be resumed.   - The HPV test is an additional test that may be used for cervical cancer screening. The HPV test looks for the virus that can cause the cell changes on the cervix. The cells collected during the Pap test can be tested for HPV. The HPV test could be used to screen women aged 70 years and older, and should be used in women of any age who have unclear Pap test results. After the age of 67, women should have HPV testing at the same frequency as a Pap test.   Colorectal cancer can be detected and often prevented. Most routine colorectal cancer screening begins at the age of 57 years and continues through age 26 years. However, your health care provider may recommend screening at an earlier age if you have risk factors for colon cancer. On a yearly basis, your health care provider may provide home test kits to check for hidden blood in the stool.  Use of a small camera at the end of a tube, to directly examine the colon (sigmoidoscopy or colonoscopy), can detect the earliest forms of colorectal cancer. Talk to your health care provider about this at age 23, when routine screening begins. Direct exam of the colon should be repeated every 5 -10 years through age 49 years, unless early forms of pre-cancerous polyps or small growths are found.   People who are at an increased risk for hepatitis B should be screened for this virus. You are considered at high risk for hepatitis B if:  -You were born in a country where hepatitis B occurs often. Talk with your health care provider about which countries are considered high risk.  - Your parents were born in a high-risk country and you have not received a shot to protect against hepatitis B (hepatitis B vaccine).  - You have HIV or AIDS.  - You use needles to inject street drugs.  - You live with, or have sex with, someone who has Hepatitis B.  - You get hemodialysis treatment.  - You take certain medicines for conditions like cancer, organ  transplantation, and autoimmune conditions.   Hepatitis C blood testing is recommended for all people born from [birth year] through 1965 and any individual  with known risks for hepatitis C.   Practice safe sex. Use condoms and avoid high-risk sexual practices to reduce the spread of sexually transmitted infections (STIs). STIs include gonorrhea, chlamydia, syphilis, trichomonas, herpes, HPV, and human immunodeficiency virus (HIV). Herpes, HIV, and HPV are viral illnesses that have no cure. They can result in disability, cancer, and death. Sexually active women aged 25 years and younger should be checked for chlamydia. Older women with new or multiple partners should also be tested for chlamydia. Testing for other STIs is recommended if you are sexually active and at increased risk.   Osteoporosis is a disease in which the bones lose minerals and strength with aging. This can result in serious bone fractures or breaks. The risk of osteoporosis can be identified using a bone density scan. Women ages 65 years and over and women at risk for fractures or osteoporosis should discuss screening with their health care providers. Ask your health care provider whether you should take a calcium supplement or vitamin D to There are also several preventive steps women can take to avoid osteoporosis and resulting fractures or to keep osteoporosis from worsening. -->Recommendations include:  Eat a balanced diet high in fruits, vegetables, calcium, and vitamins.  Get enough calcium. The recommended total intake of is 1,200 mg daily; for best absorption, if taking supplements, divide doses into 250-500 mg doses throughout the day. Of the two types of calcium, calcium carbonate is best absorbed when taken with food but calcium citrate can be taken on an empty stomach.  Get enough vitamin D. NAMS and the National Osteoporosis Foundation recommend at least 1,000 IU per day for women age 50 and over who are at risk of vitamin D  deficiency. Vitamin D deficiency can be caused by inadequate sun exposure (for example, those who live in northern latitudes).  Avoid alcohol and smoking. Heavy alcohol intake (more than 7 drinks per week) increases the risk of falls and hip fracture and women smokers tend to lose bone more rapidly and have lower bone mass than nonsmokers. Stopping smoking is one of the most important changes women can make to improve their health and decrease risk for disease.  Be physically active every day. Weight-bearing exercise (for example, fast walking, hiking, jogging, and weight training) may strengthen bones or slow the rate of bone loss that comes with aging. Balancing and muscle-strengthening exercises can reduce the risk of falling and fracture.  Consider therapeutic medications. Currently, several types of effective drugs are available. Healthcare providers can recommend the type most appropriate for each woman.  Eliminate environmental factors that may contribute to accidents. Falls cause nearly 90% of all osteoporotic fractures, so reducing this risk is an important bone-health strategy. Measures include ample lighting, removing obstructions to walking, using nonskid rugs on floors, and placing mats and/or grab bars in showers.  Be aware of medication side effects. Some common medicines make bones weaker. These include a type of steroid drug called glucocorticoids used for arthritis and asthma, some antiseizure drugs, certain sleeping pills, treatments for endometriosis, and some cancer drugs. An overactive thyroid gland or using too much thyroid hormone for an underactive thyroid can also be a problem. If you are taking these medicines, talk to your doctor about what you can do to help protect your bones.reduce the rate of osteoporosis.    Menopause can be associated with physical symptoms and risks. Hormone replacement therapy is available to decrease symptoms and risks. You should talk to your  health care provider   about whether hormone replacement therapy is right for you.   Use sunscreen. Apply sunscreen liberally and repeatedly throughout the day. You should seek shade when your shadow is shorter than you. Protect yourself by wearing long sleeves, pants, a wide-brimmed hat, and sunglasses year round, whenever you are outdoors.   Once a month, do a whole body skin exam, using a mirror to look at the skin on your back. Tell your health care provider of new moles, moles that have irregular borders, moles that are larger than a pencil eraser, or moles that have changed in shape or color.   -Stay current with required vaccines (immunizations).   Influenza vaccine. All adults should be immunized every year.  Tetanus, diphtheria, and acellular pertussis (Td, Tdap) vaccine. Pregnant women should receive 1 dose of Tdap vaccine during each pregnancy. The dose should be obtained regardless of the length of time since the last dose. Immunization is preferred during the 27th 36th week of gestation. An adult who has not previously received Tdap or who does not know her vaccine status should receive 1 dose of Tdap. This initial dose should be followed by tetanus and diphtheria toxoids (Td) booster doses every 10 years. Adults with an unknown or incomplete history of completing a 3-dose immunization series with Td-containing vaccines should begin or complete a primary immunization series including a Tdap dose. Adults should receive a Td booster every 10 years.  Varicella vaccine. An adult without evidence of immunity to varicella should receive 2 doses or a second dose if she has previously received 1 dose. Pregnant females who do not have evidence of immunity should receive the first dose after pregnancy. This first dose should be obtained before leaving the health care facility. The second dose should be obtained 4 8 weeks after the first dose.  Human papillomavirus (HPV) vaccine. Females aged 13 26  years who have not received the vaccine previously should obtain the 3-dose series. The vaccine is not recommended for use in pregnant females. However, pregnancy testing is not needed before receiving a dose. If a female is found to be pregnant after receiving a dose, no treatment is needed. In that case, the remaining doses should be delayed until after the pregnancy. Immunization is recommended for any person with an immunocompromised condition through the age of 26 years if she did not get any or all doses earlier. During the 3-dose series, the second dose should be obtained 4 8 weeks after the first dose. The third dose should be obtained 24 weeks after the first dose and 16 weeks after the second dose.  Zoster vaccine. One dose is recommended for adults aged 60 years or older unless certain conditions are present.  Measles, mumps, and rubella (MMR) vaccine. Adults born before 1957 generally are considered immune to measles and mumps. Adults born in 1957 or later should have 1 or more doses of MMR vaccine unless there is a contraindication to the vaccine or there is laboratory evidence of immunity to each of the three diseases. A routine second dose of MMR vaccine should be obtained at least 28 days after the first dose for students attending postsecondary schools, health care workers, or international travelers. People who received inactivated measles vaccine or an unknown type of measles vaccine during 1963 1967 should receive 2 doses of MMR vaccine. People who received inactivated mumps vaccine or an unknown type of mumps vaccine before 1979 and are at high risk for mumps infection should consider immunization with 2 doses of   MMR vaccine. For females of childbearing age, rubella immunity should be determined. If there is no evidence of immunity, females who are not pregnant should be vaccinated. If there is no evidence of immunity, females who are pregnant should delay immunization until after pregnancy.  Unvaccinated health care workers born before [birth year] who lack laboratory evidence of measles, mumps, or rubella immunity or laboratory confirmation of disease should consider measles and mumps immunization with 2 doses of MMR vaccine or rubella immunization with 1 dose of MMR vaccine.  Pneumococcal 13-valent conjugate (PCV13) vaccine. When indicated, a person who is uncertain of her immunization history and has no record of immunization should receive the PCV13 vaccine. An adult aged 54 years or older who has certain medical conditions and has not been previously immunized should receive 1 dose of PCV13 vaccine. This PCV13 should be followed with a dose of pneumococcal polysaccharide (PPSV23) vaccine. The PPSV23 vaccine dose should be obtained at least 8 weeks after the dose of PCV13 vaccine. An adult aged 58 years or older who has certain medical conditions and previously received 1 or more doses of PPSV23 vaccine should receive 1 dose of PCV13. The PCV13 vaccine dose should be obtained 1 or more years after the last PPSV23 vaccine dose.  Pneumococcal polysaccharide (PPSV23) vaccine. When PCV13 is also indicated, PCV13 should be obtained first. All adults aged 58 years and older should be immunized. An adult younger than age 65 years who has certain medical conditions should be immunized. Any person who resides in a nursing home or long-term care facility should be immunized. An adult smoker should be immunized. People with an immunocompromised condition and certain other conditions should receive both PCV13 and PPSV23 vaccines. People with human immunodeficiency virus (HIV) infection should be immunized as soon as possible after diagnosis. Immunization during chemotherapy or radiation therapy should be avoided. Routine use of PPSV23 vaccine is not recommended for American Indians, [place] Natives, or people younger than 65 years unless there are medical conditions that require PPSV23 vaccine. When indicated,  people who have unknown immunization and have no record of immunization should receive PPSV23 vaccine. One-time revaccination 5 years after the first dose of PPSV23 is recommended for people aged 70 64 years who have chronic kidney failure, nephrotic syndrome, asplenia, or immunocompromised conditions. People who received 1 2 doses of PPSV23 before age 32 years should receive another dose of PPSV23 vaccine at age 96 years or later if at least 5 years have passed since the previous dose. Doses of PPSV23 are not needed for people immunized with PPSV23 at or after age 55 years.  Meningococcal vaccine. Adults with asplenia or persistent complement component deficiencies should receive 2 doses of quadrivalent meningococcal conjugate (MenACWY-D) vaccine. The doses should be obtained at least 2 months apart. Microbiologists working with certain meningococcal bacteria, [employer] recruits, people at risk during an outbreak, and people who travel to or live in countries with a high rate of meningitis should be immunized. A first-year college student up through age 58 years who is living in a residence hall should receive a dose if she did not receive a dose on or after her 16th birthday. Adults who have certain high-risk conditions should receive one or more doses of vaccine.  Hepatitis A vaccine. Adults who wish to be protected from this disease, have certain high-risk conditions, work with hepatitis A-infected animals, work in hepatitis A research labs, or travel to or work in countries with a high rate of hepatitis A should be  immunized. Adults who were previously unvaccinated and who anticipate close contact with an international adoptee during the first 60 days after arrival in the [place] from a country with a high rate of hepatitis A should be immunized.  Hepatitis B vaccine.  Adults who wish to be protected from this disease, have certain high-risk conditions, may be exposed to blood or other infectious  body fluids, are household contacts or sex partners of hepatitis B positive people, are clients or workers in certain care facilities, or travel to or work in countries with a high rate of hepatitis B should be immunized.  Haemophilus influenzae type b (Hib) vaccine. A previously unvaccinated person with asplenia or sickle cell disease or having a scheduled splenectomy should receive 1 dose of Hib vaccine. Regardless of previous immunization, a recipient of a hematopoietic stem cell transplant should receive a 3-dose series 6 12 months after her successful transplant. Hib vaccine is not recommended for adults with HIV infection.  Preventive Services / Frequency Ages 6 to 39years  Blood pressure check.** / Every 1 to 2 years.  Lipid and cholesterol check.** / Every 5 years beginning at age 39.  Clinical breast exam.** / Every 3 years for women in their 61s and 62s.  BRCA-related cancer risk assessment.** / For women who have family members with a BRCA-related cancer (breast, ovarian, tubal, or peritoneal cancers).  Pap test.** / Every 2 years from ages 47 through 85. Every 3 years starting at age 34 through age 12 or 74 with a history of 3 consecutive normal Pap tests.  HPV screening.** / Every 3 years from ages 46 through ages 43 to 54 with a history of 3 consecutive normal Pap tests.  Hepatitis C blood test.** / For any individual with known risks for hepatitis C.  Skin self-exam. / Monthly.  Influenza vaccine. / Every year.  Tetanus, diphtheria, and acellular pertussis (Tdap, Td) vaccine.** / Consult your health care provider. Pregnant women should receive 1 dose of Tdap vaccine during each pregnancy. 1 dose of Td every 10 years.  Varicella vaccine.** / Consult your health care provider. Pregnant females who do not have evidence of immunity should receive the first dose after pregnancy.  HPV vaccine. / 3 doses over 6 months, if 64 and younger. The vaccine is not recommended for use in  pregnant females. However, pregnancy testing is not needed before receiving a dose.  Measles, mumps, rubella (MMR) vaccine.** / You need at least 1 dose of MMR if you were born in 1957 or later. You may also need a 2nd dose. For females of childbearing age, rubella immunity should be determined. If there is no evidence of immunity, females who are not pregnant should be vaccinated. If there is no evidence of immunity, females who are pregnant should delay immunization until after pregnancy.  Pneumococcal 13-valent conjugate (PCV13) vaccine.** / Consult your health care provider.  Pneumococcal polysaccharide (PPSV23) vaccine.** / 1 to 2 doses if you smoke cigarettes or if you have certain conditions.  Meningococcal vaccine.** / 1 dose if you are age 71 to 39 years and a Market researcher living in a residence hall, or have one of several medical conditions, you need to get vaccinated against meningococcal disease. You may also need additional booster doses.  Hepatitis A vaccine.** / Consult your health care provider.  Hepatitis B vaccine.** / Consult your health care provider.  Haemophilus influenzae type b (Hib) vaccine.** / Consult your health care provider.  Ages 55 to 64years  Blood pressure check.** / Every 1 to 2 years.  Lipid and cholesterol check.** / Every 5 years beginning at age 20 years.  Lung cancer screening. / Every year if you are aged 55 80 years and have a 30-pack-year history of smoking and currently smoke or have quit within the past 15 years. Yearly screening is stopped once you have quit smoking for at least 15 years or develop a health problem that would prevent you from having lung cancer treatment.  Clinical breast exam.** / Every year after age 40 years.  BRCA-related cancer risk assessment.** / For women who have family members with a BRCA-related cancer (breast, ovarian, tubal, or peritoneal cancers).  Mammogram.** / Every year beginning at age 40  years and continuing for as long as you are in good health. Consult with your health care provider.  Pap test.** / Every 3 years starting at age 30 years through age 65 or 70 years with a history of 3 consecutive normal Pap tests.  HPV screening.** / Every 3 years from ages 30 years through ages 65 to 70 years with a history of 3 consecutive normal Pap tests.  Fecal occult blood test (FOBT) of stool. / Every year beginning at age 50 years and continuing until age 75 years. You may not need to do this test if you get a colonoscopy every 10 years.  Flexible sigmoidoscopy or colonoscopy.** / Every 5 years for a flexible sigmoidoscopy or every 10 years for a colonoscopy beginning at age 50 years and continuing until age 75 years.  Hepatitis C blood test.** / For all people born from 1945 through 1965 and any individual with known risks for hepatitis C.  Skin self-exam. / Monthly.  Influenza vaccine. / Every year.  Tetanus, diphtheria, and acellular pertussis (Tdap/Td) vaccine.** / Consult your health care provider. Pregnant women should receive 1 dose of Tdap vaccine during each pregnancy. 1 dose of Td every 10 years.  Varicella vaccine.** / Consult your health care provider. Pregnant females who do not have evidence of immunity should receive the first dose after pregnancy.  Zoster vaccine.** / 1 dose for adults aged 60 years or older.  Measles, mumps, rubella (MMR) vaccine.** / You need at least 1 dose of MMR if you were born in 1957 or later. You may also need a 2nd dose. For females of childbearing age, rubella immunity should be determined. If there is no evidence of immunity, females who are not pregnant should be vaccinated. If there is no evidence of immunity, females who are pregnant should delay immunization until after pregnancy.  Pneumococcal 13-valent conjugate (PCV13) vaccine.** / Consult your health care provider.  Pneumococcal polysaccharide (PPSV23) vaccine.** / 1 to 2 doses if  you smoke cigarettes or if you have certain conditions.  Meningococcal vaccine.** / Consult your health care provider.  Hepatitis A vaccine.** / Consult your health care provider.  Hepatitis B vaccine.** / Consult your health care provider.  Haemophilus influenzae type b (Hib) vaccine.** / Consult your health care provider.  Ages 65 years and over  Blood pressure check.** / Every 1 to 2 years.  Lipid and cholesterol check.** / Every 5 years beginning at age 20 years.  Lung cancer screening. / Every year if you are aged 55 80 years and have a 30-pack-year history of smoking and currently smoke or have quit within the past 15 years. Yearly screening is stopped once you have quit smoking for at least 15 years or develop a health problem that   would prevent you from having lung cancer treatment.  Clinical breast exam.** / Every year after age 103 years.  BRCA-related cancer risk assessment.** / For women who have family members with a BRCA-related cancer (breast, ovarian, tubal, or peritoneal cancers).  Mammogram.** / Every year beginning at age 36 years and continuing for as long as you are in good health. Consult with your health care provider.  Pap test.** / Every 3 years starting at age 5 years through age 85 or 10 years with 3 consecutive normal Pap tests. Testing can be stopped between 65 and 70 years with 3 consecutive normal Pap tests and no abnormal Pap or HPV tests in the past 10 years.  HPV screening.** / Every 3 years from ages 93 years through ages 70 or 45 years with a history of 3 consecutive normal Pap tests. Testing can be stopped between 65 and 70 years with 3 consecutive normal Pap tests and no abnormal Pap or HPV tests in the past 10 years.  Fecal occult blood test (FOBT) of stool. / Every year beginning at age 8 years and continuing until age 45 years. You may not need to do this test if you get a colonoscopy every 10 years.  Flexible sigmoidoscopy or colonoscopy.** /  Every 5 years for a flexible sigmoidoscopy or every 10 years for a colonoscopy beginning at age 69 years and continuing until age 68 years.  Hepatitis C blood test.** / For all people born from [birth year] through 1965 and any individual with known risks for hepatitis C.  Osteoporosis screening.** / A one-time screening for women ages 7 years and over and women at risk for fractures or osteoporosis.  Skin self-exam. / Monthly.  Influenza vaccine. / Every year.  Tetanus, diphtheria, and acellular pertussis (Tdap/Td) vaccine.** / 1 dose of Td every 10 years.  Varicella vaccine.** / Consult your health care provider.  Zoster vaccine.** / 1 dose for adults aged 5 years or older.  Pneumococcal 13-valent conjugate (PCV13) vaccine.** / Consult your health care provider.  Pneumococcal polysaccharide (PPSV23) vaccine.** / 1 dose for all adults aged 74 years and older.  Meningococcal vaccine.** / Consult your health care provider.  Hepatitis A vaccine.** / Consult your health care provider.  Hepatitis B vaccine.** / Consult your health care provider.  Haemophilus influenzae type b (Hib) vaccine.** / Consult your health care provider. ** Family history and personal history of risk and conditions may change your health care provider's recommendations. Document Released: 04/02/2001 Document Revised: 11/25/2012  [facility] Patient Information 2014 [place], [place].   EXERCISE AND DIET:  We recommended that you start or continue a regular exercise program for good health. Regular exercise means any activity that makes your heart beat faster and makes you sweat.  We recommend exercising at least 30 minutes per day at least 3 days a week, preferably 5.  We also recommend a diet low in fat and sugar / carbohydrates.  Inactivity, poor dietary choices and obesity can cause diabetes, heart attack, stroke, and kidney damage, among others.     ALCOHOL AND SMOKING:  Women should limit their alcohol intake to no  more than 7 drinks/beers/glasses of wine (combined, not each!) per week. Moderation of alcohol intake to this level decreases your risk of breast cancer and liver damage.  ( And of course, no recreational drugs are part of a healthy lifestyle.)  Also, you should not be smoking at all or even being exposed to second hand smoke. Most people know smoking can  cause cancer, and various heart and lung diseases, but did you know it also contributes to weakening of your bones?  Aging of your skin?  Yellowing of your teeth and nails?   CALCIUM AND VITAMIN D:  Adequate intake of calcium and Vitamin D are recommended.  The recommendations for exact amounts of these supplements seem to change often, but generally speaking 600 mg of calcium (either carbonate or citrate) and 800 units of Vitamin D per day seems prudent. Certain women may benefit from higher intake of Vitamin D.  If you are among these women, your doctor will have told you during your visit.     PAP SMEARS:  Pap smears, to check for cervical cancer or precancers,  have traditionally been done yearly, although recent scientific advances have shown that most women can have pap smears less often.  However, every woman still should have a physical exam from her gynecologist or primary care physician every year. It will include a breast check, inspection of the vulva and vagina to check for abnormal growths or skin changes, a visual exam of the cervix, and then an exam to evaluate the size and shape of the uterus and ovaries.  And after 77 years of age, a rectal exam is indicated to check for rectal cancers. We will also provide age appropriate advice regarding health maintenance, like when you should have certain vaccines, screening for sexually transmitted diseases, bone density testing, colonoscopy, mammograms, etc.    MAMMOGRAMS:  All women over 71 years old should have a yearly mammogram. Many facilities now offer a "3D" mammogram, which may cost  around $50 extra out of pocket. If possible,  we recommend you accept the option to have the 3D mammogram performed.  It both reduces the number of women who will be called back for extra views which then turn out to be normal, and it is better than the routine mammogram at detecting truly abnormal areas.     COLONOSCOPY:  Colonoscopy to screen for colon cancer is recommended for all women at age 52.  We know, you hate the idea of the prep.  We agree, BUT, having colon cancer and not knowing it is worse!!  Colon cancer so often starts as a polyp that can be seen and removed at colonscopy, which can quite literally save your life!  And if your first colonoscopy is normal and you have no family history of colon cancer, most women don't have to have it again for 10 years.  Once every ten years, you can do something that may end up saving your life, right?  We will be happy to help you get it scheduled when you are ready.  Be sure to check your insurance coverage so you understand how much it will cost.  It may be covered as a preventative service at no cost, but you should check your particular policy.

## 2019-05-17 NOTE — Progress Notes (Signed)
Subjective:   Shelly Hickman is a 79 y.o. female who presents for Medicare Annual (Subsequent) preventive examination.    I, Toni Amend, am serving as Education administrator for [employer].   HPI: Reviewed that patient's intake questionnaire today appears very good. Patient denies concerns overall. Denies concerns with getting around at home, managing money, feeding herself, falling, etc. Patient denies concerns about her memory. She is not exercising regularly. Says she has been walking as much as she can, but has been experiencing lower back pain, which impedes her physical activity. She finally made an appointment with an orthopedic doctor for follow-up. Notes her regular orthopedic doctor reitred over the summer, but she liked the new lady very much. Notes X-rays were done of her back, and arthritis was found "where I had pins and screws put in between the L4 and 5, and some arthritis at the S1, which I never had before." Says she is also having pain in her right hip, and plans to have this assessed by the doctor as well. In general, her blood pressure runs around 125/70. Says "it's been good."      Objective:     Vitals: Ht 5\' 6"  (1.676 m)   Wt 245 lb (111.1 kg)   BMI 39.54 kg/m   Body mass index is 39.54 kg/m.  Advanced Directives 09/03/2016 03/04/2016 02/22/2016 08/30/2015 07/01/2013 12/10/2012  Does Patient Have a Medical Advance Directive? Yes - Yes Yes Patient has advance directive, copy not in chart Patient has advance directive, copy not in chart  Type of Advance Directive [place];Living will - [place];Living will Living will;Healthcare Power of Attorney Living will Living will  Copy of [place] in Chart? - No - copy requested No - copy requested - - Copy requested from family  Pre-existing out of facility DNR order (yellow form or pink MOST form) - - - - - No    Tobacco Social History   Tobacco Use  Smoking Status  Former Smoker  . Packs/day: 0.50  . Types: Cigarettes  . Start date: 02/19/1960  . Quit date: 02/18/1965  . Years since quitting: 54.2  Smokeless Tobacco Never Used     Counseling given: Not Answered     Past Medical History:  Diagnosis Date  . Anemia   . Anxiety   . Arthritis   . Depression   . H/O hiatal hernia   . Hypercholesterolemia   . Hypertension   . Incontinence of urine   . Neuromuscular disorder ([place])   . Osteoarthritis    Past Surgical History:  Procedure Laterality Date  . 2 Synovial Cysts removed between L4-L5    . ABDOMINAL HYSTERECTOMY    . BACK SURGERY     back fusion after 2 back surgeries   . CARPAL TUNNEL RELEASE    . COLONOSCOPY    . EYE SURGERY     tube inserted for excessive tearing , tube is removed   . Right Knee Arthroscopy    . SALPINGOOPHORECTOMY  1993  . TONSILLECTOMY    . 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 TOTAL KNEE ARTHROPLASTY Right 03/04/2016   Procedure: TOTAL KNEE ARTHROPLASTY;  Surgeon: Elsie Saas, MD;  Location: [place] [facility];  Service: Orthopedics;  Laterality: Right;  . WISDOM TOOTH EXTRACTION     Family History  Problem Relation Age  of Onset  . Hypertension Mother   . Diabetes Mother   . Stroke Mother   . Alcoholism Father   . Alcohol abuse Father   . Depression Sister    Social History   Socioeconomic History  . Marital status: Married    Spouse name: Not on file  . Number of children: Not on file  . Years of education: Not on file  . Highest education level: Not on file  Occupational History  . Not on file  Tobacco Use  . Smoking status: Former Smoker    Packs/day: 0.50    Types: Cigarettes    Start date: 02/19/1960    Quit date: 02/18/1965    Years since quitting: 54.2  . Smokeless tobacco: Never Used  Substance and Sexual Activity  . Alcohol use: No  . Drug use: No  . Sexual  activity: Not Currently  Other Topics Concern  . Not on file  Social History Narrative  . Not on file   Social Determinants of Health   Financial Resource Strain:   . Difficulty of Paying Living Expenses:   Food Insecurity:   . Worried About Charity fundraiser in the Last Year:   . Arboriculturist in the Last Year:   Transportation Needs:   . Film/video editor (Medical):   Marland Kitchen Lack of Transportation (Non-Medical):   Physical Activity:   . Days of Exercise per Week:   . Minutes of Exercise per Session:   Stress:   . Feeling of Stress :   Social Connections:   . Frequency of Communication with Friends and Family:   . Frequency of Social Gatherings with Friends and Family:   . Attends Religious Services:   . Active Member of Clubs or Organizations:   . Attends Archivist Meetings:   Marland Kitchen Marital Status:     Outpatient Encounter Medications as of 05/17/2019  Medication Sig  . acetaminophen (TYLENOL) 325 MG tablet Take 2 tablets (650 mg total) by mouth every 6 (six) hours as needed for mild pain (or Fever >/= 101).  Marland Kitchen amLODipine (NORVASC) 5 MG tablet Take 1 tablet (5 mg total) by mouth daily.  Marland Kitchen aspirin EC 81 MG tablet Take 81 mg by mouth daily.  . Calcium Carb-Cholecalciferol (CALCIUM 600-D PO) Take 1 tablet by mouth daily.  . Cholecalciferol (VITAMIN D-3) 5000 units TABS Take 5,000 Units by mouth daily.  . DULoxetine (CYMBALTA) 60 MG capsule TAKE 2 CAPSULES BY MOUTH DAILY BEFORE BREAKFAST.**PATIENT NEEDS APT FOR FURTHER REFILLS**  . fexofenadine (ALLEGRA) 180 MG tablet Take 1 tablet (180 mg total) by mouth daily.  Marland Kitchen gabapentin (NEURONTIN) 300 MG capsule TAKE 3 CAPSULES BY MOUTH 3 TIMES DAILY  . hydrochlorothiazide (HYDRODIURIL) 25 MG tablet TAKE 1 TABLET BY MOUTH DAILY  . levothyroxine (SYNTHROID) 25 MCG tablet TAKE 1 TABLET BY MOUTH DAILY  . losartan (COZAAR) 100 MG tablet TAKE 1 TABLET BY MOUTH DAILY  . nabumetone (RELAFEN) 500 MG tablet Take 500 mg by mouth 2  (two) times daily.  . niacin (NIASPAN) 1000 MG CR tablet Take 1 tablet (1,000 mg total) by mouth at bedtime.  Marland Kitchen omega-3 acid ethyl esters (LOVAZA) 1 g capsule TAKE 2 CAPSULES BY MOUTH TWICE DAILY  . oxybutynin (DITROPAN-XL) 10 MG 24 hr tablet Take 1 tablet (10 mg total) by mouth at bedtime.  . simvastatin (ZOCOR) 20 MG tablet TAKE 1 TABLET BY MOUTH NIGHTLY  . tiZANidine (ZANAFLEX) 4 MG capsule Take 4 mg by  mouth in the morning and at bedtime.  . traZODone (DESYREL) 100 MG tablet TAKE 1 TABLET BY MOUTH EACH NIGHT AT BEDTIME  . fluticasone (FLONASE) 50 MCG/ACT nasal spray 1 spray each nostril following sinus rinses twice daily (Patient not taking: Reported on 05/17/2019)   No facility-administered encounter medications on file as of 05/17/2019.    Activities of Daily Living In your present state of health, do you have any difficulty performing the following activities: 05/17/2019  Hearing? Y  Vision? N  Difficulty concentrating or making decisions? N  Walking or climbing stairs? N  Dressing or bathing? N  Doing errands, shopping? N  Some recent data might be hidden    Patient Care Team: Mellody Dance, DO as PCP - General (Family Medicine) Kristeen Miss, MD as Consulting Physician (Neurosurgery) Allyn Kenner, MD as Consulting Physician (Dermatology) Elsie Saas, MD as Consulting Physician (Orthopedic Surgery) [facility], Michae Kava, MD as Attending Physician Verner Chol, MD as Consulting Physician (Sports Medicine)     Assessment:   This is a routine wellness examination for [place].  Fall Risk Fall Risk  05/17/2019 01/28/2019 10/15/2018 04/22/2018 04/16/2017  Falls in the past year? 0 0 0 0 No  Number falls in past yr: 0 0 0 - -  Injury with Fall? 0 0 - - -  Follow up Falls evaluation completed Falls evaluation completed Falls evaluation completed Falls evaluation completed -   Is the patient's home free of loose throw rugs in walkways, pet beds, electrical cords, etc?    yes      Grab bars in the bathroom? yes      Handrails on the stairs?   yes      Adequate lighting?   yes  Timed Get Up and Go performed: Telehealth   Depression Screen PHQ 2/9 Scores 05/17/2019 01/28/2019 10/15/2018 09/16/2018  PHQ - 2 Score 0 0 2 0  PHQ- 9 Score 3 3 6 1      Cognitive Function 6CIT Screen 05/17/2019 12/30/2016  What Year? 0 points 0 points  What month? 0 points 0 points  What time? 0 points 0 points  Count back from 20 0 points 0 points  Months in reverse 0 points 0 points  Repeat phrase 0 points 0 points  Total Score 0 0     Immunization History  Administered Date(s) Administered  . Fluad Quad(high Dose 65+) 10/15/2018  . IPV 01/20/2010  . Influenza Split 01/23/2010  . Influenza, High Dose Seasonal PF 11/17/2015, 11/22/2016  . Influenza-Unspecified 11/26/2017  . PFIZER SARS-COV-2 Vaccination 03/13/2019, 04/03/2019  . Pneumococcal Conjugate-13 03/25/2008, 11/16/2013  . Pneumococcal Polysaccharide-23 03/25/2008, 11/16/2012  . Tdap 02/18/2010, 12/28/2015  . Zoster 02/19/2007, 11/16/2013    Qualifies for Shingles Vaccine?  utd   Screening Tests Health Maintenance  Topic Date Due  . TETANUS/TDAP  12/27/2025  . INFLUENZA VACCINE  Completed  . DEXA SCAN  Completed  . PNA vac Low Risk Adult  Completed    Cancer Screenings: Lung: Low Dose CT Chest recommended if Age 49-80 years, 30 pack-year currently smoking OR have quit w/in 15years. Patient does not qualify. Breast:  Up to date on Mammogram? Yes   Up to date of Bone Density/Dexa? No Colorectal: UTD  Additional Screenings:  Hepatitis C Screening: UTD HIV Screening: UTD    Plan:   Medicare Wellness 1:15 PM 05/08/2019 PLAN: - Reviewed need for up-to-date screenings and immunizations according to guidelines and recommendations today. Education prrovided and all questions answered. - Last mammogram obtained  12/23/2018. - Advised patient that given her age, she may continue or discontinue screening  after age 15 if desired. - Need for repeat DEXA scan. WNL last check in 2017. - Last colonoscopy obtained in 12 of 2015 with Dr. Cristina Gong, repeat recommended in 5 years. - Advised patient to call gastroenterology and schedule / obtain her colonoscopy near future. - Need for shingrix vaccination. - Patient will call her insurance regarding shingles vaccine coverage and site location. - Per patient, has received both COVID-19 vaccinations. - Patient states she will bring her card in for review next OV. - Patient knows to wait 45 days between her COVID-19 vaccination and any other vaccination. - Fasting lab work obtained today. - Encouraged patient to engage in regular cardiovascular activity, especially 150-300 minutes of cardio per week according to guidelines established by the [facility]. Advised patient to maintain her physical conditioning because "if you don't use it, you lose it."   Meds ordered this encounter  Medications  . amLODipine (NORVASC) 5 MG tablet    Sig: Take 1 tablet (5 mg total) by mouth daily.    Dispense:  90 tablet    Refill:  0  . simvastatin (ZOCOR) 20 MG tablet    Sig: Take 1 tablet (20 mg total) by mouth at bedtime.    Dispense:  90 tablet    Refill:  0    Orders Placed This Encounter  Procedures  . DG Bone Density    I have personally reviewed and noted the following in the patient's chart:   . Medical and social history . Use of alcohol, tobacco or illicit drugs  . Current medications and supplements . Functional ability and status . Nutritional status . Physical activity . Advanced directives . List of other physicians . Hospitalizations, surgeries, and ER visits in previous 12 months . Vitals . Screenings to include cognitive, depression, and falls . Referrals and appointments  In addition, I have reviewed and discussed with patient certain preventive protocols, quality metrics, and best practice recommendations. A written personalized care plan for preventive  services as well as general preventive health recommendations were provided to patient.     This document serves as a record of services personally performed by Mellody Dance, DO. It was created on her behalf by Toni Amend, a trained medical scribe. The creation of this record is based on the scribe's personal observations and the provider's statements to them.   This case required medical decision making of at least moderate complexity. The above documentation from Toni Amend, medical scribe, has been reviewed by Mellody Dance, D.O.

## 2019-05-18 LAB — CBC
Hematocrit: 45.6 % (ref 34.0–46.6)
Hemoglobin: 15.2 g/dL (ref 11.1–15.9)
MCH: 29.3 pg (ref 26.6–33.0)
MCHC: 33.3 g/dL (ref 31.5–35.7)
MCV: 88 fL (ref 79–97)
Platelets: 416 10*3/uL (ref 150–450)
RBC: 5.19 x10E6/uL (ref 3.77–5.28)
RDW: 13.5 % (ref 11.7–15.4)
WBC: 10.1 10*3/uL (ref 3.4–10.8)

## 2019-05-18 LAB — VITAMIN D 25 HYDROXY (VIT D DEFICIENCY, FRACTURES): Vit D, 25-Hydroxy: 41.6 ng/mL (ref 30.0–100.0)

## 2019-05-18 LAB — COMPREHENSIVE METABOLIC PANEL
ALT: 22 IU/L (ref 0–32)
AST: 22 IU/L (ref 0–40)
Albumin/Globulin Ratio: 1.8 (ref 1.2–2.2)
Albumin: 4.4 g/dL (ref 3.7–4.7)
Alkaline Phosphatase: 81 IU/L (ref 39–117)
BUN/Creatinine Ratio: 18 (ref 12–28)
BUN: 17 mg/dL (ref 8–27)
Bilirubin Total: 0.3 mg/dL (ref 0.0–1.2)
CO2: 27 mmol/L (ref 20–29)
Calcium: 9.6 mg/dL (ref 8.7–10.3)
Chloride: 97 mmol/L (ref 96–106)
Creatinine, Ser: 0.95 mg/dL (ref 0.57–1.00)
GFR calc Af Amer: 67 mL/min/{1.73_m2} (ref >59)
GFR calc non Af Amer: 58 mL/min/{1.73_m2} — ABNORMAL LOW (ref >59)
Globulin, Total: 2.4 g/dL (ref 1.5–4.5)
Glucose: 115 mg/dL — ABNORMAL HIGH (ref 65–99)
Potassium: 4.1 mmol/L (ref 3.5–5.2)
Sodium: 136 mmol/L (ref 134–144)
Total Protein: 6.8 g/dL (ref 6.0–8.5)

## 2019-05-18 LAB — HEMOGLOBIN A1C
Est. average glucose Bld gHb Est-mCnc: 134 mg/dL
Hgb A1c MFr Bld: 6.3 % — ABNORMAL HIGH (ref 4.8–5.6)

## 2019-05-18 LAB — LIPID PANEL
Chol/HDL Ratio: 2.8 ratio (ref 0.0–4.4)
Cholesterol, Total: 179 mg/dL (ref 100–199)
HDL: 63 mg/dL (ref >39)
LDL Chol Calc (NIH): 100 mg/dL — ABNORMAL HIGH (ref 0–99)
Triglycerides: 86 mg/dL (ref 0–149)
VLDL Cholesterol Cal: 16 mg/dL (ref 5–40)

## 2019-05-18 LAB — T4, FREE: Free T4: 1.2 ng/dL (ref 0.82–1.77)

## 2019-05-18 LAB — TSH: TSH: 5.01 u[IU]/mL — ABNORMAL HIGH (ref 0.450–4.500)

## 2019-05-25 ENCOUNTER — Telehealth: Payer: Self-pay

## 2019-05-25 NOTE — Telephone Encounter (Signed)
Received fax from [employer] stating that denied PA request for allergy relief 180mg  tablets.  Per Dr. Raliegh Scarlet, advised pt to purchase OTC fexofenadine (Allegra) 180mg  as an alternative.  Pt expressed understanding and is agreeable.  Charyl Bigger, CMA

## 2019-06-05 ENCOUNTER — Other Ambulatory Visit: Payer: Self-pay | Admitting: Family Medicine

## 2019-06-10 ENCOUNTER — Other Ambulatory Visit: Payer: Self-pay | Admitting: Family Medicine

## 2019-06-10 DIAGNOSIS — M47816 Spondylosis without myelopathy or radiculopathy, lumbar region: Secondary | ICD-10-CM | POA: Diagnosis not present

## 2019-06-10 DIAGNOSIS — M545 Low back pain: Secondary | ICD-10-CM | POA: Diagnosis not present

## 2019-06-14 ENCOUNTER — Other Ambulatory Visit: Payer: Self-pay | Admitting: Family Medicine

## 2019-06-19 DIAGNOSIS — M545 Low back pain: Secondary | ICD-10-CM | POA: Diagnosis not present

## 2019-06-30 DIAGNOSIS — M47816 Spondylosis without myelopathy or radiculopathy, lumbar region: Secondary | ICD-10-CM | POA: Diagnosis not present

## 2019-07-08 DIAGNOSIS — M5116 Intervertebral disc disorders with radiculopathy, lumbar region: Secondary | ICD-10-CM | POA: Diagnosis not present

## 2019-07-08 DIAGNOSIS — M5416 Radiculopathy, lumbar region: Secondary | ICD-10-CM | POA: Diagnosis not present

## 2019-07-12 ENCOUNTER — Other Ambulatory Visit: Payer: Self-pay | Admitting: Family Medicine

## 2019-07-12 DIAGNOSIS — F5102 Adjustment insomnia: Secondary | ICD-10-CM

## 2019-07-16 ENCOUNTER — Telehealth: Payer: Self-pay | Admitting: Physician Assistant

## 2019-07-16 DIAGNOSIS — F5102 Adjustment insomnia: Secondary | ICD-10-CM

## 2019-07-16 MED ORDER — TRAZODONE HCL 100 MG PO TABS: ORAL_TABLET | ORAL | 0 refills | Status: DC

## 2019-07-16 NOTE — Addendum Note (Signed)
Addended by: Mickel Crow on: 07/16/2019 10:51 AM   Modules accepted: Orders

## 2019-07-16 NOTE — Telephone Encounter (Signed)
Patient is requesting a refill of her trazodone, if approved please send to [facility].

## 2019-07-20 ENCOUNTER — Encounter: Payer: Self-pay | Admitting: Physician Assistant

## 2019-07-20 ENCOUNTER — Ambulatory Visit (INDEPENDENT_AMBULATORY_CARE_PROVIDER_SITE_OTHER): Payer: PPO | Admitting: Physician Assistant

## 2019-07-20 ENCOUNTER — Other Ambulatory Visit: Payer: Self-pay

## 2019-07-20 VITALS — Temp 97.8°F | Ht 66.0 in | Wt 224.1 lb

## 2019-07-20 DIAGNOSIS — F339 Major depressive disorder, recurrent, unspecified: Secondary | ICD-10-CM

## 2019-07-20 DIAGNOSIS — R2241 Localized swelling, mass and lump, right lower limb: Secondary | ICD-10-CM

## 2019-07-20 DIAGNOSIS — F439 Reaction to severe stress, unspecified: Secondary | ICD-10-CM

## 2019-07-20 DIAGNOSIS — I951 Orthostatic hypotension: Secondary | ICD-10-CM

## 2019-07-20 DIAGNOSIS — R42 Dizziness and giddiness: Secondary | ICD-10-CM

## 2019-07-20 DIAGNOSIS — L739 Follicular disorder, unspecified: Secondary | ICD-10-CM

## 2019-07-20 NOTE — Progress Notes (Signed)
Established Patient Office Visit  Subjective:  Patient ID: Shelly Hickman, female    DOB: [birth year]  Age: 79 y.o. MRN: [id number]  CC: No chief complaint on file.   HPI Shelly Hickman presents for concerns of bump on right lower leg, skin lesions on lower bottom, and dizziness. Pt states she has been experiencing dizziness for few weeks, mostly when going from one position to another and lasts briefly. Doesn't feel like the room is spinning. Denies chest pain, palpitations, vision changes, headache, fever, syncope, or shortness of breath. She discussed at length the increased stress she is experiencing at home from taking care of her husband., and states she is concerned he will need possible placement in a nursing facility and financially doesn't have the resources to cover the cost. She has a bump on her lower right which seems to be getting better. Denies pain, itchiness, or drainage. She also has two spots on her lower bottom that are also getting better. She reports poor hydration.   Past Medical History:  Diagnosis Date  . Anemia   . Anxiety   . Arthritis   . Depression   . H/O hiatal hernia   . Hypercholesterolemia   . Hypertension   . Incontinence of urine   . Neuromuscular disorder ([place])   . Osteoarthritis     Past Surgical History:  Procedure Laterality Date  . 2 Synovial Cysts removed between L4-L5    . ABDOMINAL HYSTERECTOMY    . BACK SURGERY     back fusion after 2 back surgeries   . CARPAL TUNNEL RELEASE    . COLONOSCOPY    . EYE SURGERY     tube inserted for excessive tearing , tube is removed   . Right Knee Arthroscopy    . SALPINGOOPHORECTOMY  1993  . TONSILLECTOMY    . 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 TOTAL KNEE ARTHROPLASTY Right 03/04/2016   Procedure: TOTAL KNEE ARTHROPLASTY;  Surgeon: Elsie Saas, MD;  Location:  [facility];  Service: Orthopedics;  Laterality: Right;  . WISDOM TOOTH EXTRACTION      Family History  Problem Relation Age of Onset  . Hypertension Mother   . Diabetes Mother   . Stroke Mother   . Alcoholism Father   . Alcohol abuse Father   . Depression Sister     Social History   Socioeconomic History  . Marital status: Married    Spouse name: Not on file  . Number of children: Not on file  . Years of education: Not on file  . Highest education level: Not on file  Occupational History  . Not on file  Tobacco Use  . Smoking status: Former Smoker    Packs/day: 0.50    Types: Cigarettes    Start date: 02/19/1960    Quit date: 02/18/1965    Years since quitting: 54.4  . Smokeless tobacco: Never Used  Vaping Use  . Vaping Use: Never used  Substance and Sexual Activity  . Alcohol use: No  . Drug use: No  . Sexual activity: Not Currently  Other Topics Concern  . Not on file  Social History Narrative  . Not on file   Social Determinants of Health   Financial Resource Strain:   . Difficulty of Paying Living Expenses:   Food Insecurity:   . Worried About [employer] of  Food in the Last Year:   . [place] in the Last Year:   Transportation Needs:   . Film/video editor (Medical):   Marland Kitchen Lack of Transportation (Non-Medical):   Physical Activity:   . Days of Exercise per Week:   . Minutes of Exercise per Session:   Stress:   . Feeling of Stress :   Social Connections:   . Frequency of Communication with Friends and Family:   . Frequency of Social Gatherings with Friends and Family:   . Attends Religious Services:   . Active Member of Clubs or Organizations:   . Attends Archivist Meetings:   Marland Kitchen Marital Status:   Intimate Partner Violence:   . Fear of Current or Ex-Partner:   . Emotionally Abused:   Marland Kitchen Physically Abused:   . Sexually Abused:     Outpatient Medications Prior to Visit  Medication Sig Dispense Refill  . acetaminophen (TYLENOL) 325 MG  tablet Take 2 tablets (650 mg total) by mouth every 6 (six) hours as needed for mild pain (or Fever >/= 101).    Marland Kitchen amLODipine (NORVASC) 5 MG tablet Take 1 tablet (5 mg total) by mouth daily. 90 tablet 0  . aspirin EC 81 MG tablet Take 81 mg by mouth daily.    . Calcium Carb-Cholecalciferol (CALCIUM 600-D PO) Take 1 tablet by mouth daily.    . Cholecalciferol (VITAMIN D-3) 5000 units TABS Take 5,000 Units by mouth daily.    . DULoxetine (CYMBALTA) 60 MG capsule TAKE 2 CAPSULES BY MOUTH DAILY BEFORE BREAKFAST 180 capsule 0  . fexofenadine (ALLEGRA) 180 MG tablet Take 1 tablet (180 mg total) by mouth daily. 90 tablet 1  . fluticasone (FLONASE) 50 MCG/ACT nasal spray 1 spray each nostril following sinus rinses twice daily 16 g 5  . gabapentin (NEURONTIN) 300 MG capsule TAKE 3 CAPSULES BY MOUTH 3 TIMES DAILY 810 capsule 0  . hydrochlorothiazide (HYDRODIURIL) 25 MG tablet TAKE 1 TABLET BY MOUTH DAILY 90 tablet 0  . levothyroxine (SYNTHROID) 25 MCG tablet TAKE 1 TABLET BY MOUTH DAILY 90 tablet 1  . losartan (COZAAR) 100 MG tablet TAKE 1 TABLET BY MOUTH DAILY 90 tablet 0  . nabumetone (RELAFEN) 500 MG tablet Take 500 mg by mouth 2 (two) times daily.    . niacin (NIASPAN) 1000 MG CR tablet Take 1 tablet (1,000 mg total) by mouth at bedtime. 90 tablet 3  . omega-3 acid ethyl esters (LOVAZA) 1 g capsule TAKE 2 CAPSULES BY MOUTH TWICE DAILY 360 capsule 0  . oxybutynin (DITROPAN-XL) 10 MG 24 hr tablet Take 1 tablet (10 mg total) by mouth at bedtime. 90 tablet 1  . simvastatin (ZOCOR) 20 MG tablet Take 1 tablet (20 mg total) by mouth at bedtime. 90 tablet 0  . tiZANidine (ZANAFLEX) 4 MG capsule Take 4 mg by mouth in the morning and at bedtime.    . traZODone (DESYREL) 100 MG tablet TAKE 1 TABLET BY MOUTH EACH NIGHT AT BEDTIME 90 tablet 0   No facility-administered medications prior to visit.    Allergies  Allergen Reactions  . Avelox [Moxifloxacin Hcl In Nacl] Other (See Comments)    CHEST PAIN OF  UNSPECIFIED ORIGIN  . Cephalosporins Hives and Itching  . Bee Venom   . Other Itching and Swelling    UNSPECIFIED INSECT STINGS    ROS Review of Systems  A fourteen system review of systems was performed and found to be positive as per HPI.  Objective:    Physical Exam General: Well nourished, in no apparent distress. Eyes: PERRLA, EOMs, conjunctiva clr Resp: Respiratory effort- normal, ECTA B/L w/o W/R/R  Cardio: RRR w/o MRGs. Abdomen: no gross distention. Lymphatics:  less 2 sec cap RF M-sk: Full ROM, 5/5 strength, normal gait.  Skin: Warm, dry. Small, round red-purple nodule on right achilles tendon present. 2- small healing nodules surrounding hair follicle underneath buttocks noted.      Neuro: Alert, Oriented. Negative Dix-Hallpike.  Psych: Normal affect, Insight and Judgment appropriate.   Temp 97.8 F (36.6 C) (Oral)   Ht _0  (1.676 m)   Wt 224 lb 1.6 oz (101.7 kg)   SpO2 96% Comment: on RA  BMI 36.17 kg/m  Wt Readings from Last 3 Encounters:  07/20/19 224 lb 1.6 oz (101.7 kg)  05/17/19 245 lb (111.1 kg)  01/28/19 235 lb (106.6 kg)     There are no preventive care reminders to display for this patient.  There are no preventive care reminders to display for this patient.  Lab Results  Component Value Date   TSH 5.010 (H) 05/17/2019   Lab Results  Component Value Date   WBC 17.9 (H) 07/21/2019   HGB 13.8 07/21/2019   HCT 42.7 07/21/2019   MCV 87 07/21/2019   PLT 415 07/21/2019   Lab Results  Component Value Date   NA 134 07/21/2019   K 4.1 07/21/2019   CO2 22 07/21/2019   GLUCOSE 98 07/21/2019   BUN 28 (H) 07/21/2019   CREATININE 0.96 07/21/2019   BILITOT 0.4 07/21/2019   ALKPHOS 65 07/21/2019   AST 18 07/21/2019   ALT 23 07/21/2019   PROT 6.2 07/21/2019   ALBUMIN 3.9 07/21/2019   CALCIUM 10.1 07/21/2019   ANIONGAP 7 03/07/2016   Lab Results  Component Value Date   CHOL 179 05/17/2019   Lab Results  Component Value Date   HDL  63 05/17/2019   Lab Results  Component Value Date   LDLCALC 100 (H) 05/17/2019   Lab Results  Component Value Date   TRIG 86 05/17/2019   Lab Results  Component Value Date   CHOLHDL 2.8 05/17/2019   Lab Results  Component Value Date   HGBA1C 6.3 (H) 05/17/2019      Assessment & Plan:   Problem List Items Addressed This Visit      Other   Depression, recurrent ([place])    Other Visit Diagnoses    Orthostatic hypotension    -  Primary   Relevant Orders   Comp Met (CMET) (Completed)   CBC w/Diff (Completed)   B12 (Completed)   Stress at home       Folliculitis       Nodule of skin of right lower leg       Dizziness          Orthostatic hypotension, Dizziness: -  >98 mmHg systolic drop occurred with orthostatics indicating orthostatic hypotension. - Dizziness most likely related to orthostatic hypotension, which is probably multi-factorial due to increased stress and dehydration.  - Will check CMP, CBC, and vitamin B12 to evaluate for possible etiologies such as anemia, electrolyte imbalances, vitamin B12 deficiency. - Stay well hydrated, at least 64 fl oz daily. - Per medication review, pt is taking some medications that could be contributing to orthostatic hypotension and if symptoms fail to improve or worsen will consider medication adjustments.   Skin nodule of right lower leg: - Nodule appearance resembles dermatofibroma and no concerning signs  present. - Will continue to monitor. - If nodule changes or becomes bothersome then will consider dermatology referral for further evaluation and management.   Folliculitis: - Resolving and healing well.  - Recommend to keep area clean and avoid tight clothing.   No orders of the defined types were placed in this encounter.   Follow-up: Return if symptoms worsen or fail to improve, for Lab visit only (cmp, cbc, vitamin b12).    Lorrene Reid, PA-C

## 2019-07-20 NOTE — Patient Instructions (Signed)

## 2019-07-21 ENCOUNTER — Other Ambulatory Visit: Payer: PPO

## 2019-07-21 DIAGNOSIS — I951 Orthostatic hypotension: Secondary | ICD-10-CM | POA: Diagnosis not present

## 2019-07-22 DIAGNOSIS — Z0389 Encounter for observation for other suspected diseases and conditions ruled out: Secondary | ICD-10-CM | POA: Diagnosis not present

## 2019-07-22 LAB — CBC WITH DIFFERENTIAL/PLATELET
Basophils Absolute: 0.1 10*3/uL (ref 0.0–0.2)
Basos: 0 %
EOS (ABSOLUTE): 0.3 10*3/uL (ref 0.0–0.4)
Eos: 2 %
Hematocrit: 42.7 % (ref 34.0–46.6)
Hemoglobin: 13.8 g/dL (ref 11.1–15.9)
Immature Grans (Abs): 0 10*3/uL (ref 0.0–0.1)
Immature Granulocytes: 0 %
Lymphocytes Absolute: 3.3 10*3/uL — ABNORMAL HIGH (ref 0.7–3.1)
Lymphs: 18 %
MCH: 28.2 pg (ref 26.6–33.0)
MCHC: 32.3 g/dL (ref 31.5–35.7)
MCV: 87 fL (ref 79–97)
Monocytes Absolute: 1.8 10*3/uL — ABNORMAL HIGH (ref 0.1–0.9)
Monocytes: 10 %
Neutrophils Absolute: 12.4 10*3/uL — ABNORMAL HIGH (ref 1.4–7.0)
Neutrophils: 70 %
Platelets: 415 10*3/uL (ref 150–450)
RBC: 4.9 x10E6/uL (ref 3.77–5.28)
RDW: 14.3 % (ref 11.7–15.4)
WBC: 17.9 10*3/uL — ABNORMAL HIGH (ref 3.4–10.8)

## 2019-07-22 LAB — HM DEXA SCAN

## 2019-07-22 LAB — COMPREHENSIVE METABOLIC PANEL
ALT: 23 IU/L (ref 0–32)
AST: 18 IU/L (ref 0–40)
Albumin/Globulin Ratio: 1.7 (ref 1.2–2.2)
Albumin: 3.9 g/dL (ref 3.7–4.7)
Alkaline Phosphatase: 65 IU/L (ref 48–121)
BUN/Creatinine Ratio: 29 — ABNORMAL HIGH (ref 12–28)
BUN: 28 mg/dL — ABNORMAL HIGH (ref 8–27)
Bilirubin Total: 0.4 mg/dL (ref 0.0–1.2)
CO2: 22 mmol/L (ref 20–29)
Calcium: 10.1 mg/dL (ref 8.7–10.3)
Chloride: 97 mmol/L (ref 96–106)
Creatinine, Ser: 0.96 mg/dL (ref 0.57–1.00)
GFR calc Af Amer: 66 mL/min/{1.73_m2} (ref >59)
GFR calc non Af Amer: 57 mL/min/{1.73_m2} — ABNORMAL LOW (ref >59)
Globulin, Total: 2.3 g/dL (ref 1.5–4.5)
Glucose: 98 mg/dL (ref 65–99)
Potassium: 4.1 mmol/L (ref 3.5–5.2)
Sodium: 134 mmol/L (ref 134–144)
Total Protein: 6.2 g/dL (ref 6.0–8.5)

## 2019-07-22 LAB — VITAMIN B12: Vitamin B-12: 367 pg/mL (ref 232–1245)

## 2019-07-29 ENCOUNTER — Encounter: Payer: Self-pay | Admitting: Physician Assistant

## 2019-08-01 NOTE — Assessment & Plan Note (Signed)
-   PHQ9 score of 14 and pt attributes symptoms to increased stress at home. Denies SI/HI. - Pt declined medication adjustment for mood management. - Continue Cymbalta. - Encouraged to incorporate non-pharmacologic therapy such as walking and doing enjoyable activities.  - Will continue to monitor.

## 2019-08-04 DIAGNOSIS — M5410 Radiculopathy, site unspecified: Secondary | ICD-10-CM | POA: Diagnosis not present

## 2019-08-12 DIAGNOSIS — M5116 Intervertebral disc disorders with radiculopathy, lumbar region: Secondary | ICD-10-CM | POA: Diagnosis not present

## 2019-08-12 DIAGNOSIS — M5417 Radiculopathy, lumbosacral region: Secondary | ICD-10-CM | POA: Diagnosis not present

## 2019-08-17 ENCOUNTER — Other Ambulatory Visit: Payer: Self-pay | Admitting: Family Medicine

## 2019-08-17 DIAGNOSIS — I10 Essential (primary) hypertension: Secondary | ICD-10-CM

## 2019-08-17 DIAGNOSIS — E782 Mixed hyperlipidemia: Secondary | ICD-10-CM

## 2019-08-25 ENCOUNTER — Telehealth: Payer: Self-pay | Admitting: Physician Assistant

## 2019-08-25 DIAGNOSIS — I10 Essential (primary) hypertension: Secondary | ICD-10-CM

## 2019-08-25 DIAGNOSIS — E782 Mixed hyperlipidemia: Secondary | ICD-10-CM

## 2019-08-25 MED ORDER — SIMVASTATIN 20 MG PO TABS: 20.0000 mg | ORAL_TABLET | Freq: Every day | ORAL | 0 refills | Status: DC

## 2019-08-25 MED ORDER — AMLODIPINE BESYLATE 5 MG PO TABS: 5.0000 mg | ORAL_TABLET | Freq: Every day | ORAL | 0 refills | Status: DC

## 2019-08-25 NOTE — Addendum Note (Signed)
Addended by: Mickel Crow on: 08/25/2019 10:20 AM   Modules accepted: Orders

## 2019-08-25 NOTE — Telephone Encounter (Signed)
Medication refill sent to requested pharmacy. AS, CMA 

## 2019-08-25 NOTE — Telephone Encounter (Signed)
Patient is requesting a refill of her amlodipine and simvastatin, if approved please send to [facility]

## 2019-09-16 ENCOUNTER — Encounter: Payer: Self-pay | Admitting: Physician Assistant

## 2019-09-30 ENCOUNTER — Other Ambulatory Visit: Payer: Self-pay | Admitting: Physician Assistant

## 2019-09-30 DIAGNOSIS — F5102 Adjustment insomnia: Secondary | ICD-10-CM

## 2019-10-07 ENCOUNTER — Ambulatory Visit (INDEPENDENT_AMBULATORY_CARE_PROVIDER_SITE_OTHER): Payer: PPO | Admitting: Physician Assistant

## 2019-10-07 ENCOUNTER — Encounter: Payer: Self-pay | Admitting: Physician Assistant

## 2019-10-07 ENCOUNTER — Other Ambulatory Visit: Payer: Self-pay

## 2019-10-07 VITALS — BP 121/80 | Temp 97.6°F | Ht 66.0 in | Wt 224.7 lb

## 2019-10-07 DIAGNOSIS — F339 Major depressive disorder, recurrent, unspecified: Secondary | ICD-10-CM

## 2019-10-07 DIAGNOSIS — Z636 Dependent relative needing care at home: Secondary | ICD-10-CM

## 2019-10-07 DIAGNOSIS — I951 Orthostatic hypotension: Secondary | ICD-10-CM

## 2019-10-07 NOTE — Progress Notes (Signed)
Established Patient Office Visit  Subjective:  Patient ID: Shelly Hickman, female    DOB: [birth year]  Age: 79 y.o. MRN: [id number]  CC:  Chief Complaint  Patient presents with  . Dizziness  . Depression  . Anxiety    HPI [name] presents for orthostatic hypotension. Reports she continues to have dizziness when going from sitting to standing. Denies chest pain, palpitations, shortness of breath or ear ringing. She continues to have caregiver stress even though she does have a nurse/aide help her a few hours during the day. Reports it is becoming more challenging to take care of her husband. She has been drinking more water and is trying to stay well hydrated.  Past Medical History:  Diagnosis Date  . Anemia   . Anxiety   . Arthritis   . Depression   . H/O hiatal hernia   . Hypercholesterolemia   . Hypertension   . Incontinence of urine   . Neuromuscular disorder ([place])   . Osteoarthritis     Past Surgical History:  Procedure Laterality Date  . 2 Synovial Cysts removed between L4-L5    . ABDOMINAL HYSTERECTOMY    . BACK SURGERY     back fusion after 2 back surgeries   . CARPAL TUNNEL RELEASE    . COLONOSCOPY    . EYE SURGERY     tube inserted for excessive tearing , tube is removed   . Right Knee Arthroscopy    . SALPINGOOPHORECTOMY  1993  . TONSILLECTOMY    . 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 TOTAL KNEE ARTHROPLASTY Right 03/04/2016   Procedure: TOTAL KNEE ARTHROPLASTY;  Surgeon: Elsie Saas, MD;  Location: [facility];  Service: Orthopedics;  Laterality: Right;  . WISDOM TOOTH EXTRACTION      Family History  Problem Relation Age of Onset  . Hypertension Mother   . Diabetes Mother   . Stroke Mother   . Alcoholism Father   . Alcohol abuse Father   . Depression Sister     Social History   Socioeconomic History  .  Marital status: Married    Spouse name: Not on file  . Number of children: Not on file  . Years of education: Not on file  . Highest education level: Not on file  Occupational History  . Not on file  Tobacco Use  . Smoking status: Former Smoker    Packs/day: 0.50    Types: Cigarettes    Start date: 02/19/1960    Quit date: 02/18/1965    Years since quitting: 54.7  . Smokeless tobacco: Never Used  Vaping Use  . Vaping Use: Never used  Substance and Sexual Activity  . Alcohol use: No  . Drug use: No  . Sexual activity: Not Currently  Other Topics Concern  . Not on file  Social History Narrative  . Not on file   Social Determinants of Health   Financial Resource Strain:   . Difficulty of Paying Living Expenses: Not on file  Food Insecurity:   . Worried About [employer] in the Last Year: Not on file  . Ran Out of Food in the Last Year: Not on file  Transportation Needs:   . Lack of Transportation (Medical): Not on file  . Lack of Transportation (Non-Medical): Not on file  Physical Activity:   . Days of  Exercise per Week: Not on file  . Minutes of Exercise per Session: Not on file  Stress:   . Feeling of Stress : Not on file  Social Connections:   . Frequency of Communication with Friends and Family: Not on file  . Frequency of Social Gatherings with Friends and Family: Not on file  . Attends Religious Services: Not on file  . Active Member of Clubs or Organizations: Not on file  . Attends Archivist Meetings: Not on file  . Marital Status: Not on file  Intimate Partner Violence:   . Fear of Current or Ex-Partner: Not on file  . Emotionally Abused: Not on file  . Physically Abused: Not on file  . Sexually Abused: Not on file    Outpatient Medications Prior to Visit  Medication Sig Dispense Refill  . acetaminophen (TYLENOL) 325 MG tablet Take 2 tablets (650 mg total) by mouth every 6 (six) hours as needed for mild pain (or Fever >/= 101).    Marland Kitchen  amLODipine (NORVASC) 5 MG tablet Take 1 tablet (5 mg total) by mouth daily. 90 tablet 0  . aspirin EC 81 MG tablet Take 81 mg by mouth daily.    . Calcium Carb-Cholecalciferol (CALCIUM 600-D PO) Take 1 tablet by mouth daily.    . Cholecalciferol (VITAMIN D-3) 5000 units TABS Take 5,000 Units by mouth daily.    . fexofenadine (ALLEGRA) 180 MG tablet Take 1 tablet (180 mg total) by mouth daily. 90 tablet 1  . fluticasone (FLONASE) 50 MCG/ACT nasal spray 1 spray each nostril following sinus rinses twice daily 16 g 5  . gabapentin (NEURONTIN) 300 MG capsule TAKE 3 CAPSULES BY MOUTH 3 TIMES DAILY 810 capsule 0  . nabumetone (RELAFEN) 500 MG tablet Take 500 mg by mouth 2 (two) times daily.    . niacin (NIASPAN) 1000 MG CR tablet Take 1 tablet (1,000 mg total) by mouth at bedtime. 90 tablet 3  . oxybutynin (DITROPAN-XL) 10 MG 24 hr tablet Take 1 tablet (10 mg total) by mouth at bedtime. 90 tablet 1  . simvastatin (ZOCOR) 20 MG tablet Take 1 tablet (20 mg total) by mouth at bedtime. 90 tablet 0  . DULoxetine (CYMBALTA) 60 MG capsule TAKE 2 CAPSULES BY MOUTH DAILY BEFORE BREAKFAST 180 capsule 0  . hydrochlorothiazide (HYDRODIURIL) 25 MG tablet TAKE 1 TABLET BY MOUTH DAILY 90 tablet 0  . levothyroxine (SYNTHROID) 25 MCG tablet TAKE 1 TABLET BY MOUTH DAILY 90 tablet 1  . losartan (COZAAR) 100 MG tablet TAKE 1 TABLET BY MOUTH DAILY 90 tablet 0  . omega-3 acid ethyl esters (LOVAZA) 1 g capsule TAKE 2 CAPSULES BY MOUTH TWICE DAILY 360 capsule 0  . tiZANidine (ZANAFLEX) 4 MG capsule Take 4 mg by mouth in the morning and at bedtime.    . traZODone (DESYREL) 100 MG tablet TAKE 1 TABLET BY MOUTH EACH NIGHT AT BEDTIME 30 tablet 0   No facility-administered medications prior to visit.    Allergies  Allergen Reactions  . Avelox [Moxifloxacin Hcl In Nacl] Other (See Comments)    CHEST PAIN OF UNSPECIFIED ORIGIN  . Cephalosporins Hives and Itching  . Bee Venom   . Other Itching and Swelling    UNSPECIFIED  INSECT STINGS    ROS Review of Systems Review of Systems:  A fourteen system review of systems was performed and found to be positive as per HPI   Objective:    Physical Exam General:  Well Developed, well nourished, appropriate  for stated age.  Neuro:  Alert and oriented,  extra-ocular muscles intact, neg Dix-Hallpike   HEENT:  Normocephalic, atraumatic, neck supple Skin:  no gross rash, warm, pink. Cardiac:  RRR, S1 S2 Respiratory:  ECTA B/L and A/P, Not using accessory muscles, speaking in full sentences- unlabored. Vascular:  Ext warm, no cyanosis apprec.; cap RF less 2 sec. Psych:  No HI/SI, judgement and insight good, Euthymic mood. Full Affect.  Temp 97.6 F (36.4 C) (Oral)   Ht 5\' 6"  (1.676 m)   Wt 224 lb 11.2 oz (101.9 kg)   SpO2 96%   BMI 36.27 kg/m  Wt Readings from Last 3 Encounters:  10/07/19 224 lb 11.2 oz (101.9 kg)  07/20/19 224 lb 1.6 oz (101.7 kg)  05/17/19 245 lb (111.1 kg)     Health Maintenance Due  Topic Date Due  . INFLUENZA VACCINE  09/19/2019    There are no preventive care reminders to display for this patient.  Lab Results  Component Value Date   TSH 5.010 (H) 05/17/2019   Lab Results  Component Value Date   WBC 17.9 (H) 07/21/2019   HGB 13.8 07/21/2019   HCT 42.7 07/21/2019   MCV 87 07/21/2019   PLT 415 07/21/2019   Lab Results  Component Value Date   NA 134 07/21/2019   K 4.1 07/21/2019   CO2 22 07/21/2019   GLUCOSE 98 07/21/2019   BUN 28 (H) 07/21/2019   CREATININE 0.96 07/21/2019   BILITOT 0.4 07/21/2019   ALKPHOS 65 07/21/2019   AST 18 07/21/2019   ALT 23 07/21/2019   PROT 6.2 07/21/2019   ALBUMIN 3.9 07/21/2019   CALCIUM 10.1 07/21/2019   ANIONGAP 7 03/07/2016   Lab Results  Component Value Date   CHOL 179 05/17/2019   Lab Results  Component Value Date   HDL 63 05/17/2019   Lab Results  Component Value Date   LDLCALC 100 (H) 05/17/2019   Lab Results  Component Value Date   TRIG 86 05/17/2019   Lab  Results  Component Value Date   CHOLHDL 2.8 05/17/2019   Lab Results  Component Value Date   HGBA1C 6.3 (H) 05/17/2019      Assessment & Plan:   Problem List Items Addressed This Visit    None    Visit Diagnoses    Orthostatic hypotension    -  Primary   Caregiver stress         Orthostatic hypotension:  -Discussed with patient several medications on her med list can cause orthostatic hypotension/dizziness.  Reports she is no longer taking tizanidine and is agreeable to stopping HCTZ to see if her symptoms improve.  Her BP is stable and recommend to monitor BP and pulse at home.  Advised to notify the clinic if BP starts increasing and is consistently above 140/90.  Patient verbalized understanding. -Discussed with patient on a cardiology referral for further evaluation.  Patient prefers to wait at this time and if symptoms worsen or do not improve she is agreeable to cardiology referral. -Recommend to continue to stay well-hydrated.  Caregiver stress, Depression: -Likely also contributing to symptoms and advised to get additional help if possible and to contact her husband's PCP to see if there are other resources patient's husband could qualify for. Patient verbalized understanding. -Continue current medication regimen. -Encouraged to go for daily walks or listen to meditation or relaxing videos to help decrease stress levels.  No orders of the defined types were placed in this encounter.  Follow-up: Return in about 3 months (around 01/07/2020) for HTN, Mood, and FBW .   Note:  This note was prepared with assistance of Dragon voice recognition software. Occasional wrong-word or sound-a-like substitutions may have occurred due to the inherent limitations of voice recognition software.  Lorrene Reid, PA-C

## 2019-10-07 NOTE — Patient Instructions (Signed)

## 2019-10-13 ENCOUNTER — Telehealth: Payer: Self-pay | Admitting: Physician Assistant

## 2019-10-13 DIAGNOSIS — N3946 Mixed incontinence: Secondary | ICD-10-CM | POA: Diagnosis not present

## 2019-10-13 DIAGNOSIS — R350 Frequency of micturition: Secondary | ICD-10-CM | POA: Diagnosis not present

## 2019-10-13 MED ORDER — DULOXETINE HCL 60 MG PO CPEP: 120.0000 mg | ORAL_CAPSULE | Freq: Every day | ORAL | 0 refills | Status: DC

## 2019-10-13 NOTE — Telephone Encounter (Signed)
Patient is requesting a refill of her Cymbalta, if approved please send to [facility]

## 2019-10-13 NOTE — Addendum Note (Signed)
Addended by: Mickel Crow on: 10/13/2019 11:07 AM   Modules accepted: Orders

## 2019-10-13 NOTE — Telephone Encounter (Signed)
Refill sent to requested pharmacy. AS, CMA 

## 2019-10-19 ENCOUNTER — Telehealth: Payer: Self-pay | Admitting: Physician Assistant

## 2019-10-19 MED ORDER — LOSARTAN POTASSIUM 100 MG PO TABS: 100.0000 mg | ORAL_TABLET | Freq: Every day | ORAL | 0 refills | Status: DC

## 2019-10-19 NOTE — Telephone Encounter (Signed)
Patient is requesting a refill of her losartan, if approved please send to [facility].

## 2019-10-19 NOTE — Telephone Encounter (Signed)
Refill sent to requested pharmacy. AS, CMA 

## 2019-10-19 NOTE — Addendum Note (Signed)
Addended by: Mickel Crow on: 10/19/2019 02:18 PM   Modules accepted: Orders

## 2019-10-26 ENCOUNTER — Other Ambulatory Visit: Payer: Self-pay | Admitting: Physician Assistant

## 2019-10-26 DIAGNOSIS — F5102 Adjustment insomnia: Secondary | ICD-10-CM

## 2019-10-27 ENCOUNTER — Other Ambulatory Visit: Payer: Self-pay | Admitting: Physician Assistant

## 2019-10-27 DIAGNOSIS — E781 Pure hyperglyceridemia: Secondary | ICD-10-CM

## 2019-10-27 DIAGNOSIS — E038 Other specified hypothyroidism: Secondary | ICD-10-CM

## 2019-10-27 DIAGNOSIS — E782 Mixed hyperlipidemia: Secondary | ICD-10-CM

## 2019-10-27 MED ORDER — OMEGA-3-ACID ETHYL ESTERS 1 G PO CAPS: ORAL_CAPSULE | ORAL | 0 refills | Status: DC

## 2019-10-27 MED ORDER — LEVOTHYROXINE SODIUM 25 MCG PO TABS: 25.0000 ug | ORAL_TABLET | Freq: Every day | ORAL | 0 refills | Status: DC

## 2019-11-02 DIAGNOSIS — L918 Other hypertrophic disorders of the skin: Secondary | ICD-10-CM | POA: Diagnosis not present

## 2019-11-02 DIAGNOSIS — L718 Other rosacea: Secondary | ICD-10-CM | POA: Diagnosis not present

## 2019-11-02 DIAGNOSIS — L304 Erythema intertrigo: Secondary | ICD-10-CM | POA: Diagnosis not present

## 2019-11-02 DIAGNOSIS — L821 Other seborrheic keratosis: Secondary | ICD-10-CM | POA: Diagnosis not present

## 2019-11-05 ENCOUNTER — Other Ambulatory Visit: Payer: Self-pay

## 2019-11-05 ENCOUNTER — Ambulatory Visit (INDEPENDENT_AMBULATORY_CARE_PROVIDER_SITE_OTHER): Payer: PPO | Admitting: Physician Assistant

## 2019-11-05 ENCOUNTER — Encounter: Payer: Self-pay | Admitting: Physician Assistant

## 2019-11-05 VITALS — BP 121/73 | HR 73 | Ht 66.0 in | Wt 223.4 lb

## 2019-11-05 DIAGNOSIS — M549 Dorsalgia, unspecified: Secondary | ICD-10-CM

## 2019-11-05 DIAGNOSIS — F5102 Adjustment insomnia: Secondary | ICD-10-CM

## 2019-11-05 LAB — POCT URINALYSIS DIPSTICK
Bilirubin, UA: NEGATIVE
Blood, UA: NEGATIVE
Glucose, UA: NEGATIVE
Ketones, UA: NEGATIVE
Leukocytes, UA: NEGATIVE
Nitrite, UA: NEGATIVE
Protein, UA: NEGATIVE
Spec Grav, UA: 1.02 (ref 1.010–1.025)
Urobilinogen, UA: 0.2 E.U./dL
pH, UA: 6 (ref 5.0–8.0)

## 2019-11-05 MED ORDER — METHOCARBAMOL 500 MG PO TABS: 500.0000 mg | ORAL_TABLET | Freq: Three times a day (TID) | ORAL | 0 refills | Status: DC | PRN

## 2019-11-05 MED ORDER — TRAZODONE HCL 100 MG PO TABS: 100.0000 mg | ORAL_TABLET | Freq: Every evening | ORAL | 0 refills | Status: DC | PRN

## 2019-11-05 NOTE — Progress Notes (Signed)
Acute Office Visit  Subjective:    Patient ID: Shelly Hickman, female    DOB: 19-Dec-1940, 79 y.o.   MRN: [id number]  Chief Complaint  Patient presents with  . Back Pain    HPI Patient is in today for back pain, which is much better today. Reports yesterday she was experiencing severe back pain mostly on right mid back which radiated to her breast bone. States she felt lots of tension and felt like a muscle spasm. She applied heat which significantly improved symptoms. She has an upcoming appointment with her Neurosurgeon next week. Denies bladder or bowel changes, fever or chills.  Insomnia: Reports she takes trazodone to help with sleep and sometimes she takes 1.5 tablets.  She recently tried to get a refill on medication from pharmacy but was unable to, and does not know why.  Past Medical History:  Diagnosis Date  . Anemia   . Anxiety   . Arthritis   . Depression   . H/O hiatal hernia   . Hypercholesterolemia   . Hypertension   . Incontinence of urine   . Neuromuscular disorder ([place])   . Osteoarthritis     Past Surgical History:  Procedure Laterality Date  . 2 Synovial Cysts removed between L4-L5    . ABDOMINAL HYSTERECTOMY    . BACK SURGERY     back fusion after 2 back surgeries   . CARPAL TUNNEL RELEASE    . COLONOSCOPY    . EYE SURGERY     tube inserted for excessive tearing , tube is removed   . Right Knee Arthroscopy    . SALPINGOOPHORECTOMY  1993  . TONSILLECTOMY    . 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 TOTAL KNEE ARTHROPLASTY Right 03/04/2016   Procedure: TOTAL KNEE ARTHROPLASTY;  Surgeon: Elsie Saas, MD;  Location: [facility];  Service: Orthopedics;  Laterality: Right;  . WISDOM TOOTH EXTRACTION      Family History  Problem Relation Age of Onset  . Hypertension Mother   . Diabetes Mother   . Stroke Mother   . Alcoholism  Father   . Alcohol abuse Father   . Depression Sister     Social History   Socioeconomic History  . Marital status: Married    Spouse name: Not on file  . Number of children: Not on file  . Years of education: Not on file  . Highest education level: Not on file  Occupational History  . Not on file  Tobacco Use  . Smoking status: Former Smoker    Packs/day: 0.50    Types: Cigarettes    Start date: 02/19/1960    Quit date: 02/18/1965    Years since quitting: 54.7  . Smokeless tobacco: Never Used  Vaping Use  . Vaping Use: Never used  Substance and Sexual Activity  . Alcohol use: No  . Drug use: No  . Sexual activity: Not Currently  Other Topics Concern  . Not on file  Social History Narrative  . Not on file   Social Determinants of Health   Financial Resource Strain:   . Difficulty of Paying Living Expenses: Not on file  Food Insecurity:   . Worried About Charity fundraiser in the Last Year: Not on file  . Ran Out of Food in the Last Year: Not on file  Transportation Needs:   .  Lack of Transportation (Medical): Not on file  . Lack of Transportation (Non-Medical): Not on file  Physical Activity:   . Days of Exercise per Week: Not on file  . Minutes of Exercise per Session: Not on file  Stress:   . Feeling of Stress : Not on file  Social Connections:   . Frequency of Communication with Friends and Family: Not on file  . Frequency of Social Gatherings with Friends and Family: Not on file  . Attends Religious Services: Not on file  . Active Member of Clubs or Organizations: Not on file  . Attends Archivist Meetings: Not on file  . Marital Status: Not on file  Intimate Partner Violence:   . Fear of Current or Ex-Partner: Not on file  . Emotionally Abused: Not on file  . Physically Abused: Not on file  . Sexually Abused: Not on file    Outpatient Medications Prior to Visit  Medication Sig Dispense Refill  . acetaminophen (TYLENOL) 325 MG tablet Take 2  tablets (650 mg total) by mouth every 6 (six) hours as needed for mild pain (or Fever >/= 101).    Marland Kitchen amLODipine (NORVASC) 5 MG tablet Take 1 tablet (5 mg total) by mouth daily. 90 tablet 0  . aspirin EC 81 MG tablet Take 81 mg by mouth daily.    . Calcium Carb-Cholecalciferol (CALCIUM 600-D PO) Take 1 tablet by mouth daily.    . Cholecalciferol (VITAMIN D-3) 5000 units TABS Take 5,000 Units by mouth daily.    . DULoxetine (CYMBALTA) 60 MG capsule Take 2 capsules (120 mg total) by mouth daily. 180 capsule 0  . fexofenadine (ALLEGRA) 180 MG tablet Take 1 tablet (180 mg total) by mouth daily. 90 tablet 1  . fluticasone (FLONASE) 50 MCG/ACT nasal spray 1 spray each nostril following sinus rinses twice daily 16 g 5  . gabapentin (NEURONTIN) 300 MG capsule TAKE 3 CAPSULES BY MOUTH 3 TIMES DAILY 810 capsule 0  . levothyroxine (SYNTHROID) 25 MCG tablet Take 1 tablet (25 mcg total) by mouth daily. 90 tablet 0  . losartan (COZAAR) 100 MG tablet Take 1 tablet (100 mg total) by mouth daily. 90 tablet 0  . nabumetone (RELAFEN) 500 MG tablet Take 500 mg by mouth 2 (two) times daily.    . niacin (NIASPAN) 1000 MG CR tablet Take 1 tablet (1,000 mg total) by mouth at bedtime. 90 tablet 3  . omega-3 acid ethyl esters (LOVAZA) 1 g capsule TAKE 2 CAPSULES BY MOUTH TWICE DAILY 360 capsule 0  . oxybutynin (DITROPAN-XL) 10 MG 24 hr tablet Take 1 tablet (10 mg total) by mouth at bedtime. 90 tablet 1  . simvastatin (ZOCOR) 20 MG tablet Take 1 tablet (20 mg total) by mouth at bedtime. 90 tablet 0  . traZODone (DESYREL) 100 MG tablet TAKE 1 TABLET BY MOUTH EACH NIGHT AT BEDTIME 30 tablet 0   No facility-administered medications prior to visit.    Allergies  Allergen Reactions  . Avelox [Moxifloxacin Hcl In Nacl] Other (See Comments)    CHEST PAIN OF UNSPECIFIED ORIGIN  . Cephalosporins Hives and Itching  . Bee Venom   . Other Itching and Swelling    UNSPECIFIED INSECT STINGS    Review of Systems  A fourteen  system review of systems was performed and found to be positive as per HPI.    Objective:    Physical Exam  General: Well nourished, in no apparent distress. Eyes: PERRLA, EOMs, conjunctiva clr Resp: Respiratory  effort- normal, ECTA B/L w/o W/R/R  Cardio: RRR S1 S2 Abdomen: no gross distention. Lymphatics:  less 2 sec cap RF M-sk: No bony abnormalities, TTP of low back, good ROM  Skin: Warm, dry  Neuro: Alert, Oriented Psych: Normal affect, Insight and Judgment appropriate.     BP 121/73   Pulse 73   Ht 5\' 6"  (1.676 m)   Wt 223 lb 6.4 oz (101.3 kg)   SpO2 95%   BMI 36.06 kg/m  Wt Readings from Last 3 Encounters:  11/05/19 223 lb 6.4 oz (101.3 kg)  10/07/19 224 lb 11.2 oz (101.9 kg)  07/20/19 224 lb 1.6 oz (101.7 kg)    Health Maintenance Due  Topic Date Due  . INFLUENZA VACCINE  09/19/2019    There are no preventive care reminders to display for this patient.   Lab Results  Component Value Date   TSH 5.010 (H) 05/17/2019   Lab Results  Component Value Date   WBC 17.9 (H) 07/21/2019   HGB 13.8 07/21/2019   HCT 42.7 07/21/2019   MCV 87 07/21/2019   PLT 415 07/21/2019   Lab Results  Component Value Date   NA 134 07/21/2019   K 4.1 07/21/2019   CO2 22 07/21/2019   GLUCOSE 98 07/21/2019   BUN 28 (H) 07/21/2019   CREATININE 0.96 07/21/2019   BILITOT 0.4 07/21/2019   ALKPHOS 65 07/21/2019   AST 18 07/21/2019   ALT 23 07/21/2019   PROT 6.2 07/21/2019   ALBUMIN 3.9 07/21/2019   CALCIUM 10.1 07/21/2019   ANIONGAP 7 03/07/2016   Lab Results  Component Value Date   CHOL 179 05/17/2019   Lab Results  Component Value Date   HDL 63 05/17/2019   Lab Results  Component Value Date   LDLCALC 100 (H) 05/17/2019   Lab Results  Component Value Date   TRIG 86 05/17/2019   Lab Results  Component Value Date   CHOLHDL 2.8 05/17/2019   Lab Results  Component Value Date   HGBA1C 6.3 (H) 05/17/2019       Assessment & Plan:   Problem List Items  Addressed This Visit      Other   Insomnia (Chronic)   Relevant Medications   traZODone (DESYREL) 100 MG tablet    Other Visit Diagnoses    Back pain, unspecified back location, unspecified back pain laterality, unspecified chronicity    -  Primary   Relevant Medications   methocarbamol (ROBAXIN) 500 MG tablet   Other Relevant Orders   POCT urinalysis dipstick (Completed)     Back pain: -Patient is followed by neurosurgery for DDD and has an upcoming appointment on Monday, 11/08/19. -Ordered UA which was normal and revealed no signs of UTI. -Recommend to continue with heat therapy and will send in a muscle relaxer to take as needed for pain relief/muscle spasm. -Recommend to avoid provocative activities. -Follow-up with neurosurgery as scheduled.  Insomnia: -Continue trazodone as needed for sleep.  Resent prescription for 90-day supply.   Meds ordered this encounter  Medications  . traZODone (DESYREL) 100 MG tablet    Sig: Take 1 tablet (100 mg total) by mouth at bedtime as needed for sleep.    Dispense:  90 tablet    Refill:  0    Order Specific Question:   Supervising Provider    Answer:   Beatrice Lecher D [[id number]]  . methocarbamol (ROBAXIN) 500 MG tablet    Sig: Take 1 tablet (500 mg total) by mouth every  8 (eight) hours as needed for muscle spasms.    Dispense:  60 tablet    Refill:  0    Order Specific Question:   Supervising Provider    Answer:   Beatrice Lecher D [[id number]]   Note:  This note was prepared with assistance of Dragon voice recognition software. Occasional wrong-word or sound-a-like substitutions may have occurred due to the inherent limitations of voice recognition software.   Lorrene Reid, PA-C

## 2019-11-05 NOTE — Patient Instructions (Signed)
Acute Back Pain, Adult Acute back pain is sudden and usually short-lived. It is often caused by an injury to the muscles and tissues in the back. The injury may result from:  A muscle or ligament getting overstretched or torn (strained). Ligaments are tissues that connect bones to each other. Lifting something improperly can cause a back strain.  Wear and tear (degeneration) of the spinal disks. Spinal disks are circular tissue that provides cushioning between the bones of the spine (vertebrae).  Twisting motions, such as while playing sports or doing yard work.  A hit to the back.  Arthritis. You may have a physical exam, lab tests, and imaging tests to find the cause of your pain. Acute back pain usually goes away with rest and home care. Follow these instructions at home: Managing pain, stiffness, and swelling  Take over-the-counter and prescription medicines only as told by your health care provider.  Your health care provider may recommend applying ice during the first 24-48 hours after your pain starts. To do this: ? Put ice in a plastic bag. ? Place a towel between your skin and the bag. ? Leave the ice on for 20 minutes, 2-3 times a day.  If directed, apply heat to the affected area as often as told by your health care provider. Use the heat source that your health care provider recommends, such as a moist heat pack or a heating pad. ? Place a towel between your skin and the heat source. ? Leave the heat on for 20-30 minutes. ? Remove the heat if your skin turns bright red. This is especially important if you are unable to feel pain, heat, or cold. You have a greater risk of getting burned. Activity   Do not stay in bed. Staying in bed for more than 1-2 days can delay your recovery.  Sit up and stand up straight. Avoid leaning forward when you sit, or hunching over when you stand. ? If you work at a desk, sit close to it so you do not need to lean over. Keep your chin tucked  in. Keep your neck drawn back, and keep your elbows bent at a right angle. Your arms should look like the letter "L." ? Sit high and close to the steering wheel when you drive. Add lower back (lumbar) support to your car seat, if needed.  Take short walks on even surfaces as soon as you are able. Try to increase the length of time you walk each day.  Do not sit, drive, or stand in one place for more than 30 minutes at a time. Sitting or standing for long periods of time can put stress on your back.  Do not drive or use heavy machinery while taking prescription pain medicine.  Use proper lifting techniques. When you bend and lift, use positions that put less stress on your back: ? Bend your knees. ? Keep the load close to your body. ? Avoid twisting.  Exercise regularly as told by your health care provider. Exercising helps your back heal faster and helps prevent back injuries by keeping muscles strong and flexible.  Work with a physical therapist to make a safe exercise program, as recommended by your health care provider. Do any exercises as told by your physical therapist. Lifestyle  Maintain a healthy weight. Extra weight puts stress on your back and makes it difficult to have good posture.  Avoid activities or situations that make you feel anxious or stressed. Stress and anxiety increase muscle   tension and can make back pain worse. Learn ways to manage anxiety and stress, such as through exercise. General instructions  Sleep on a firm mattress in a comfortable position. Try lying on your side with your knees slightly bent. If you lie on your back, put a pillow under your knees.  Follow your treatment plan as told by your health care provider. This may include: ? Cognitive or behavioral therapy. ? Acupuncture or massage therapy. ? Meditation or yoga. Contact a health care provider if:  You have pain that is not relieved with rest or medicine.  You have increasing pain going down  into your legs or buttocks.  Your pain does not improve after 2 weeks.  You have pain at night.  You lose weight without trying.  You have a fever or chills. Get help right away if:  You develop new bowel or bladder control problems.  You have unusual weakness or numbness in your arms or legs.  You develop nausea or vomiting.  You develop abdominal pain.  You feel faint. Summary  Acute back pain is sudden and usually short-lived.  Use proper lifting techniques. When you bend and lift, use positions that put less stress on your back.  Take over-the-counter and prescription medicines and apply heat or ice as directed by your health care provider. This information is not intended to replace advice given to you by your health care provider. Make sure you discuss any questions you have with your health care provider. Document Revised: 05/26/2018 Document Reviewed: 09/18/2016 Elsevier Patient Education  2020 [employer]  

## 2019-11-12 DIAGNOSIS — M47816 Spondylosis without myelopathy or radiculopathy, lumbar region: Secondary | ICD-10-CM | POA: Diagnosis not present

## 2019-11-17 ENCOUNTER — Telehealth: Payer: Self-pay | Admitting: Physician Assistant

## 2019-11-17 DIAGNOSIS — E785 Hyperlipidemia, unspecified: Secondary | ICD-10-CM

## 2019-11-17 DIAGNOSIS — E781 Pure hyperglyceridemia: Secondary | ICD-10-CM

## 2019-11-17 MED ORDER — NIACIN ER (ANTIHYPERLIPIDEMIC) 1000 MG PO TBCR: 1000.0000 mg | EXTENDED_RELEASE_TABLET | Freq: Every day | ORAL | 0 refills | Status: DC

## 2019-11-17 NOTE — Addendum Note (Signed)
Addended by: Mickel Crow on: 11/17/2019 11:07 AM   Modules accepted: Orders

## 2019-11-17 NOTE — Telephone Encounter (Signed)
Patient is requesting a refill of her niacin, if approved please send to [facility]

## 2019-11-17 NOTE — Telephone Encounter (Signed)
Patient has future apt (11/25/19) with Dr. Einar Pheasant for new patient.   Sending in #30 day refills and note for new PCP to manage after. AS, CMA

## 2019-11-18 ENCOUNTER — Other Ambulatory Visit: Payer: Self-pay | Admitting: Physician Assistant

## 2019-11-18 DIAGNOSIS — E782 Mixed hyperlipidemia: Secondary | ICD-10-CM

## 2019-11-18 DIAGNOSIS — I10 Essential (primary) hypertension: Secondary | ICD-10-CM

## 2019-11-25 ENCOUNTER — Ambulatory Visit: Payer: PPO | Admitting: Family Medicine

## 2019-12-16 ENCOUNTER — Other Ambulatory Visit: Payer: Self-pay | Admitting: Physician Assistant

## 2019-12-16 DIAGNOSIS — E781 Pure hyperglyceridemia: Secondary | ICD-10-CM

## 2019-12-16 DIAGNOSIS — I10 Essential (primary) hypertension: Secondary | ICD-10-CM

## 2019-12-16 DIAGNOSIS — E782 Mixed hyperlipidemia: Secondary | ICD-10-CM

## 2019-12-16 DIAGNOSIS — E785 Hyperlipidemia, unspecified: Secondary | ICD-10-CM

## 2019-12-21 DIAGNOSIS — M5417 Radiculopathy, lumbosacral region: Secondary | ICD-10-CM | POA: Diagnosis not present

## 2019-12-21 DIAGNOSIS — M5117 Intervertebral disc disorders with radiculopathy, lumbosacral region: Secondary | ICD-10-CM | POA: Diagnosis not present

## 2019-12-29 DIAGNOSIS — M47816 Spondylosis without myelopathy or radiculopathy, lumbar region: Secondary | ICD-10-CM | POA: Diagnosis not present

## 2019-12-29 DIAGNOSIS — M545 Low back pain, unspecified: Secondary | ICD-10-CM | POA: Diagnosis not present

## 2019-12-31 ENCOUNTER — Other Ambulatory Visit: Payer: Self-pay | Admitting: Physician Assistant

## 2019-12-31 DIAGNOSIS — Z0000 Encounter for general adult medical examination without abnormal findings: Secondary | ICD-10-CM

## 2020-01-03 ENCOUNTER — Other Ambulatory Visit: Payer: Self-pay

## 2020-01-03 ENCOUNTER — Other Ambulatory Visit: Payer: PPO

## 2020-01-03 DIAGNOSIS — Z0000 Encounter for general adult medical examination without abnormal findings: Secondary | ICD-10-CM | POA: Diagnosis not present

## 2020-01-04 DIAGNOSIS — M545 Low back pain, unspecified: Secondary | ICD-10-CM | POA: Diagnosis not present

## 2020-01-04 DIAGNOSIS — M47816 Spondylosis without myelopathy or radiculopathy, lumbar region: Secondary | ICD-10-CM | POA: Diagnosis not present

## 2020-01-04 LAB — CBC
Hematocrit: 40.8 % (ref 34.0–46.6)
Hemoglobin: 13.4 g/dL (ref 11.1–15.9)
MCH: 28.7 pg (ref 26.6–33.0)
MCHC: 32.8 g/dL (ref 31.5–35.7)
MCV: 87 fL (ref 79–97)
Platelets: 422 10*3/uL (ref 150–450)
RBC: 4.67 x10E6/uL (ref 3.77–5.28)
RDW: 13.1 % (ref 11.7–15.4)
WBC: 15 10*3/uL — ABNORMAL HIGH (ref 3.4–10.8)

## 2020-01-06 DIAGNOSIS — M545 Low back pain, unspecified: Secondary | ICD-10-CM | POA: Diagnosis not present

## 2020-01-06 DIAGNOSIS — M47816 Spondylosis without myelopathy or radiculopathy, lumbar region: Secondary | ICD-10-CM | POA: Diagnosis not present

## 2020-01-07 ENCOUNTER — Encounter: Payer: Self-pay | Admitting: Physician Assistant

## 2020-01-07 ENCOUNTER — Other Ambulatory Visit: Payer: Self-pay

## 2020-01-07 ENCOUNTER — Other Ambulatory Visit: Payer: PPO

## 2020-01-07 ENCOUNTER — Ambulatory Visit (INDEPENDENT_AMBULATORY_CARE_PROVIDER_SITE_OTHER): Payer: PPO | Admitting: Physician Assistant

## 2020-01-07 VITALS — BP 179/82 | HR 78 | Temp 98.2°F | Ht 66.0 in | Wt 226.3 lb

## 2020-01-07 DIAGNOSIS — I951 Orthostatic hypotension: Secondary | ICD-10-CM

## 2020-01-07 DIAGNOSIS — I10 Essential (primary) hypertension: Secondary | ICD-10-CM

## 2020-01-07 DIAGNOSIS — Z636 Dependent relative needing care at home: Secondary | ICD-10-CM

## 2020-01-07 DIAGNOSIS — F339 Major depressive disorder, recurrent, unspecified: Secondary | ICD-10-CM | POA: Diagnosis not present

## 2020-01-07 NOTE — Progress Notes (Signed)
Established Patient Office Visit  Subjective:  Patient ID: Shelly Hickman, female    DOB: January 21, 1941  Age: 79 y.o. MRN: [id number]  CC:  Chief Complaint  Patient presents with  . Depression  . Hypotension    HPI Shelly Hickman presents for follow-up on hypertension, orthostatic hypotension and mood.  Patient was referred to PT by her neurosurgeon and had first therapy session yesterday so is experiencing some muscle soreness on upper back.  HTN: Pt denies chest pain, palpitations, dyspnea, or increased lower extremity swelling from baseline. Reports since stopping hydrochlorothiazide dizziness has improved. Taking medication as directed without side effects. Does not check blood pressure at home. Tries to stay hydrated.  Mood: Patient has caregiver stress, feels like her mood is stable.  States her daughter will be visiting for Thanksgiving.   Past Medical History:  Diagnosis Date  . Anemia   . Anxiety   . Arthritis   . Depression   . H/O hiatal hernia   . Hypercholesterolemia   . Hypertension   . Incontinence of urine   . Neuromuscular disorder ([place])   . Osteoarthritis     Past Surgical History:  Procedure Laterality Date  . 2 Synovial Cysts removed between L4-L5    . ABDOMINAL HYSTERECTOMY    . BACK SURGERY     back fusion after 2 back surgeries   . CARPAL TUNNEL RELEASE    . COLONOSCOPY    . EYE SURGERY     tube inserted for excessive tearing , tube is removed   . Right Knee Arthroscopy    . SALPINGOOPHORECTOMY  1993  . TONSILLECTOMY    . 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 TOTAL KNEE ARTHROPLASTY Right 03/04/2016   Procedure: TOTAL KNEE ARTHROPLASTY;  Surgeon: Elsie Saas, MD;  Location: [facility];  Service: Orthopedics;  Laterality: Right;  . WISDOM TOOTH EXTRACTION      Family History  Problem Relation Age of Onset  .  Hypertension Mother   . Diabetes Mother   . Stroke Mother   . Alcoholism Father   . Alcohol abuse Father   . Depression Sister     Social History   Socioeconomic History  . Marital status: Married    Spouse name: Not on file  . Number of children: Not on file  . Years of education: Not on file  . Highest education level: Not on file  Occupational History  . Not on file  Tobacco Use  . Smoking status: Former Smoker    Packs/day: 0.50    Types: Cigarettes    Start date: 02/19/1960    Quit date: 02/18/1965    Years since quitting: 54.9  . Smokeless tobacco: Never Used  Vaping Use  . Vaping Use: Never used  Substance and Sexual Activity  . Alcohol use: No  . Drug use: No  . Sexual activity: Not Currently  Other Topics Concern  . Not on file  Social History Narrative  . Not on file   Social Determinants of Health   Financial Resource Strain:   . Difficulty of Paying Living Expenses: Not on file  Food Insecurity:   . Worried About Charity fundraiser in the Last Year: Not on file  . Ran Out of Food in the Last Year: Not on file  Transportation Needs:   . Lack of Transportation (Medical):  Not on file  . Lack of Transportation (Non-Medical): Not on file  Physical Activity:   . Days of Exercise per Week: Not on file  . Minutes of Exercise per Session: Not on file  Stress:   . Feeling of Stress : Not on file  Social Connections:   . Frequency of Communication with Friends and Family: Not on file  . Frequency of Social Gatherings with Friends and Family: Not on file  . Attends Religious Services: Not on file  . Active Member of Clubs or Organizations: Not on file  . Attends Archivist Meetings: Not on file  . Marital Status: Not on file  Intimate Partner Violence:   . Fear of Current or Ex-Partner: Not on file  . Emotionally Abused: Not on file  . Physically Abused: Not on file  . Sexually Abused: Not on file    Outpatient Medications Prior to Visit   Medication Sig Dispense Refill  . acetaminophen (TYLENOL) 325 MG tablet Take 2 tablets (650 mg total) by mouth every 6 (six) hours as needed for mild pain (or Fever >/= 101).    Marland Kitchen amLODipine (NORVASC) 5 MG tablet TAKE 1 TABLET BY MOUTH DAILY 30 tablet 0  . aspirin EC 81 MG tablet Take 81 mg by mouth daily.    . Calcium Carb-Cholecalciferol (CALCIUM 600-D PO) Take 1 tablet by mouth daily.    . Cholecalciferol (VITAMIN D-3) 5000 units TABS Take 5,000 Units by mouth daily.    . diphenhydrAMINE (BENADRYL) 25 mg capsule Take 25 mg by mouth every 6 (six) hours as needed.    . DULoxetine (CYMBALTA) 60 MG capsule Take 2 capsules (120 mg total) by mouth daily. 180 capsule 0  . fexofenadine (ALLEGRA) 180 MG tablet Take 1 tablet (180 mg total) by mouth daily. 90 tablet 1  . fluticasone (FLONASE) 50 MCG/ACT nasal spray 1 spray each nostril following sinus rinses twice daily 16 g 5  . gabapentin (NEURONTIN) 300 MG capsule TAKE 3 CAPSULES BY MOUTH 3 TIMES DAILY 810 capsule 0  . levothyroxine (SYNTHROID) 25 MCG tablet Take 1 tablet (25 mcg total) by mouth daily. 90 tablet 0  . losartan (COZAAR) 100 MG tablet Take 1 tablet (100 mg total) by mouth daily. 90 tablet 0  . methocarbamol (ROBAXIN) 500 MG tablet Take 1 tablet (500 mg total) by mouth every 8 (eight) hours as needed for muscle spasms. 60 tablet 0  . nabumetone (RELAFEN) 500 MG tablet Take 500 mg by mouth 2 (two) times daily.    . niacin (NIASPAN) 1000 MG CR tablet TAKE 1 TABLET BY MOUTH EACH NIGHT AT BEDTIME 30 tablet 0  . omega-3 acid ethyl esters (LOVAZA) 1 g capsule TAKE 2 CAPSULES BY MOUTH TWICE DAILY 360 capsule 0  . oxybutynin (DITROPAN-XL) 10 MG 24 hr tablet Take 1 tablet (10 mg total) by mouth at bedtime. 90 tablet 1  . simvastatin (ZOCOR) 20 MG tablet TAKE 1 TABLET BY MOUTH EACH NIGHT AT BEDTIME 30 tablet 0  . traZODone (DESYREL) 100 MG tablet Take 1 tablet (100 mg total) by mouth at bedtime as needed for sleep. 90 tablet 0   No  facility-administered medications prior to visit.    Allergies  Allergen Reactions  . Avelox [Moxifloxacin Hcl In Nacl] Other (See Comments)    CHEST PAIN OF UNSPECIFIED ORIGIN  . Cephalosporins Hives and Itching  . Bee Venom   . Other Itching and Swelling    UNSPECIFIED INSECT STINGS    ROS  Review of Systems Review of Systems:  A fourteen system review of systems was performed and found to be positive as per HPI. Objective:    Physical Exam General:  Well Developed, well nourished, appropriate for stated age.  Neuro:  Alert and oriented,  extra-ocular muscles intact  HEENT:  Normocephalic, atraumatic, neck supple, no carotid bruits appreciated  Skin:  no gross rash, warm, pink. Cardiac:  RRR, S1 S2 Respiratory:  ECTA B/L, Not using accessory muscles, speaking in full sentences- unlabored. MSK: TTP of thoracic area underneath shoulder blade, good ROM, no bony abnormality noted Vascular:  Ext warm, no cyanosis apprec.; no edema Psych:  No HI/SI, judgement and insight good, Euthymic mood. Full Affect.   BP (!) 179/82   Pulse 78   Temp 98.2 F (36.8 C) (Oral)   Ht 5\' 6"  (1.676 m)   Wt 226 lb 4.8 oz (102.6 kg)   SpO2 95% Comment: on RA  BMI 36.53 kg/m  Wt Readings from Last 3 Encounters:  01/07/20 226 lb 4.8 oz (102.6 kg)  11/05/19 223 lb 6.4 oz (101.3 kg)  10/07/19 224 lb 11.2 oz (101.9 kg)     Health Maintenance Due  Topic Date Due  . INFLUENZA VACCINE  09/19/2019    There are no preventive care reminders to display for this patient.  Lab Results  Component Value Date   TSH 5.010 (H) 05/17/2019   Lab Results  Component Value Date   WBC 15.0 (H) 01/03/2020   HGB 13.4 01/03/2020   HCT 40.8 01/03/2020   MCV 87 01/03/2020   PLT 422 01/03/2020   Lab Results  Component Value Date   NA 134 07/21/2019   K 4.1 07/21/2019   CO2 22 07/21/2019   GLUCOSE 98 07/21/2019   BUN 28 (H) 07/21/2019   CREATININE 0.96 07/21/2019   BILITOT 0.4 07/21/2019   ALKPHOS 65  07/21/2019   AST 18 07/21/2019   ALT 23 07/21/2019   PROT 6.2 07/21/2019   ALBUMIN 3.9 07/21/2019   CALCIUM 10.1 07/21/2019   ANIONGAP 7 03/07/2016   Lab Results  Component Value Date   CHOL 179 05/17/2019   Lab Results  Component Value Date   HDL 63 05/17/2019   Lab Results  Component Value Date   LDLCALC 100 (H) 05/17/2019   Lab Results  Component Value Date   TRIG 86 05/17/2019   Lab Results  Component Value Date   CHOLHDL 2.8 05/17/2019   Lab Results  Component Value Date   HGBA1C 6.3 (H) 05/17/2019      Assessment & Plan:   Problem List Items Addressed This Visit      Cardiovascular and Mediastinum   Essential hypertension, benign - Primary (Chronic)     Other   Morbid obesity ([facility]) (Chronic)   Depression, recurrent ([place])    Other Visit Diagnoses    Orthostatic hypotension       Caregiver stress         Essential hypertension: -BP elevated today and most likely related to back pain.  Previous BP readings in the office have been at goal. -Continue current medication regimen. -Recommend to start checking BP and pulse at home and keep a log.  Notify the clinic if BP consistently >140/90. -Stay well-hydrated and monitor sodium. -Will continue to monitor.  Orthostatic hypotension: -Dizziness has improved. -Discussed with patient if symptoms worsen recommend further evaluation such as cardiology referral/work up. -Recommend to stay well-hydrated. -Will continue to monitor.  Caregiver stress, Recurrent depression: -PHQ-9  score of 2, improved from prior. -Continue current medication regimen. -Will continue to monitor.  Morbid obesity: -Associated with hypertension, hyperlipidemia, and prediabetes. -Patient verbalizes she needs to lose weight and encourage to start with dietary and lifestyle changes by setting small goals.    No orders of the defined types were placed in this encounter.   Follow-up: Return in about 3 months (around 04/08/2020)  for Mood, HTN, PreDM.   Note:  This note was prepared with assistance of Dragon voice recognition software. Occasional wrong-word or sound-a-like substitutions may have occurred due to the inherent limitations of voice recognition software.  Lorrene Reid, PA-C

## 2020-01-07 NOTE — Patient Instructions (Signed)
Managing Stress, Adult Feeling a certain amount of stress is normal. Stress helps our body and mind get ready to deal with the demands of life. Stress hormones can motivate you to do well at work and meet your responsibilities. However severe or long-lasting (chronic) stress can affect your mental and physical health. Chronic stress puts you at higher risk for anxiety, depression, and other health problems like digestive problems, muscle aches, heart disease, high blood pressure, and stroke. What are the causes? Common causes of stress include:  Demands from work, such as deadlines, feeling overworked, or having long hours.  Pressures at home, such as money issues, disagreements with a spouse, or parenting issues.  Pressures from major life changes, such as divorce, moving, loss of a loved one, or chronic illness. You may be at higher risk for stress-related problems if you do not get enough sleep, are in poor health, do not have emotional support, or have a mental health disorder like anxiety or depression. How to recognize stress Stress can make you:  Have trouble sleeping.  Feel sad, anxious, irritable, or overwhelmed.  Lose your appetite.  Overeat or want to eat unhealthy foods.  Want to use drugs or alcohol. Stress can also cause physical symptoms, such as:  Sore, tense muscles, especially in the shoulders and neck.  Headaches.  Trouble breathing.  A faster heart rate.  Stomach pain, nausea, or vomiting.  Diarrhea or constipation.  Trouble concentrating. Follow these instructions at home: Lifestyle  Identify the source of your stress and your reaction to it. See a therapist who can help you change your reactions.  When there are stressful events: ? Talk about it with family, friends, or co-workers. ? Try to think realistically about stressful events and not ignore them or overreact. ? Try to find the positives in a stressful situation and not focus on the  negatives. ? Cut back on responsibilities at work and home, if possible. Ask for help from friends or family members if you need it.  Find ways to cope with stress, such as: ? Meditation. ? Deep breathing. ? Yoga or tai chi. ? Progressive muscle relaxation. ? Doing art, playing music, or reading. ? Making time for fun activities. ? Spending time with family and friends.  Get support from family, friends, or spiritual resources. Eating and drinking  Eat a healthy diet. This includes: ? Eating foods that are high in fiber, such as beans, whole grains, and fresh fruits and vegetables. ? Limiting foods that are high in fat and processed sugars, such as fried and sweet foods.  Do not skip meals or overeat.  Drink enough fluid to keep your urine pale yellow. Alcohol use  Do not drink alcohol if: ? Your health care provider tells you not to drink. ? You are pregnant, may be pregnant, or are planning to become pregnant.  Drinking alcohol is a way some people try to ease their stress. This can be dangerous, so if you drink alcohol: ? Limit how much you use to:  0-1 drink a day for women.  0-2 drinks a day for men. ? Be aware of how much alcohol is in your drink. In the [place] one drink equals one 12 oz bottle of beer (355 mL), one 5 oz glass of wine (148 mL), or one 1 oz glass of hard liquor (44 mL). Activity   Include 30 minutes of exercise in your daily schedule. Exercise is a good stress reducer.  Include time in your day   for an activity that you find relaxing. Try taking a walk, going on a bike ride, reading a book, or listening to music.  Schedule your time in a way that lowers stress, and keep a consistent schedule. Prioritize what is most important to get done. General instructions  Get enough sleep. Try to go to sleep and get up at about the same time every day.  Take over-the-counter and prescription medicines only as told by your health care provider.  Do not use any  products that contain nicotine or tobacco, such as cigarettes, e-cigarettes, and chewing tobacco. If you need help quitting, ask your health care provider.  Do not use drugs or smoke to cope with stress.  Keep all follow-up visits as told by your health care provider. This is important. Where to find support  Talk with your health care provider about stress management or finding a support group.  Find a therapist to work with you on your stress management techniques. Contact a health care provider if:  Your stress symptoms get worse.  You are unable to manage your stress at home.  You are struggling to stop using drugs or alcohol. Get help right away if:  You may be a danger to yourself or others.  You have any thoughts of death or suicide. If you ever feel like you may hurt yourself or others, or have thoughts about taking your own life, get help right away. You can go to your nearest emergency department or call:  Your local emergency services (911 in the [place]  A suicide crisis helpline, such as the [place] [facility] at 786-499-0900. This is open 24 hours a day. Summary  Feeling a certain amount of stress is normal, but severe or long-lasting (chronic) stress can affect your mental and physical health.  Chronic stress can put you at higher risk for anxiety, depression, and other health problems like digestive problems, muscle aches, heart disease, high blood pressure, and stroke.  You may be at higher risk for stress-related problems if you do not get enough sleep, are in poor health, lack emotional support, or have a mental health disorder like anxiety or depression.  Identify the source of your stress and your reaction to it. Try talking about stressful events with family, friends, or co-workers, finding a coping method, or getting support from spiritual resources.  If you need more help, talk with your health care provider about finding a support group  or a mental health therapist. This information is not intended to replace advice given to you by your health care provider. Make sure you discuss any questions you have with your health care provider. Document Revised: 09/02/2018 Document Reviewed: 09/02/2018 Elsevier Patient Education  [place].

## 2020-01-10 ENCOUNTER — Ambulatory Visit: Payer: PPO | Admitting: Physician Assistant

## 2020-01-11 DIAGNOSIS — M545 Low back pain, unspecified: Secondary | ICD-10-CM | POA: Diagnosis not present

## 2020-01-11 DIAGNOSIS — M47816 Spondylosis without myelopathy or radiculopathy, lumbar region: Secondary | ICD-10-CM | POA: Diagnosis not present

## 2020-01-12 ENCOUNTER — Ambulatory Visit: Payer: PPO | Admitting: Physician Assistant

## 2020-01-17 ENCOUNTER — Other Ambulatory Visit: Payer: Self-pay | Admitting: Physician Assistant

## 2020-01-17 DIAGNOSIS — E785 Hyperlipidemia, unspecified: Secondary | ICD-10-CM

## 2020-01-17 DIAGNOSIS — E781 Pure hyperglyceridemia: Secondary | ICD-10-CM

## 2020-01-17 DIAGNOSIS — E782 Mixed hyperlipidemia: Secondary | ICD-10-CM

## 2020-01-17 DIAGNOSIS — I10 Essential (primary) hypertension: Secondary | ICD-10-CM

## 2020-01-18 DIAGNOSIS — M545 Low back pain, unspecified: Secondary | ICD-10-CM | POA: Diagnosis not present

## 2020-01-18 DIAGNOSIS — M47816 Spondylosis without myelopathy or radiculopathy, lumbar region: Secondary | ICD-10-CM | POA: Diagnosis not present

## 2020-01-19 ENCOUNTER — Other Ambulatory Visit: Payer: Self-pay | Admitting: Physician Assistant

## 2020-01-19 DIAGNOSIS — E781 Pure hyperglyceridemia: Secondary | ICD-10-CM

## 2020-01-19 DIAGNOSIS — E782 Mixed hyperlipidemia: Secondary | ICD-10-CM

## 2020-01-20 ENCOUNTER — Other Ambulatory Visit: Payer: Self-pay | Admitting: Physician Assistant

## 2020-01-20 DIAGNOSIS — M47816 Spondylosis without myelopathy or radiculopathy, lumbar region: Secondary | ICD-10-CM | POA: Diagnosis not present

## 2020-01-20 DIAGNOSIS — M545 Low back pain, unspecified: Secondary | ICD-10-CM | POA: Diagnosis not present

## 2020-01-25 DIAGNOSIS — Z1231 Encounter for screening mammogram for malignant neoplasm of breast: Secondary | ICD-10-CM | POA: Diagnosis not present

## 2020-01-25 DIAGNOSIS — M47816 Spondylosis without myelopathy or radiculopathy, lumbar region: Secondary | ICD-10-CM | POA: Diagnosis not present

## 2020-01-25 DIAGNOSIS — M545 Low back pain, unspecified: Secondary | ICD-10-CM | POA: Diagnosis not present

## 2020-01-25 LAB — HM MAMMOGRAPHY

## 2020-01-26 ENCOUNTER — Ambulatory Visit: Payer: PPO

## 2020-02-01 DIAGNOSIS — M47816 Spondylosis without myelopathy or radiculopathy, lumbar region: Secondary | ICD-10-CM | POA: Diagnosis not present

## 2020-02-01 DIAGNOSIS — M545 Low back pain, unspecified: Secondary | ICD-10-CM | POA: Diagnosis not present

## 2020-02-03 DIAGNOSIS — M47816 Spondylosis without myelopathy or radiculopathy, lumbar region: Secondary | ICD-10-CM | POA: Diagnosis not present

## 2020-02-03 DIAGNOSIS — M545 Low back pain, unspecified: Secondary | ICD-10-CM | POA: Diagnosis not present

## 2020-02-21 ENCOUNTER — Other Ambulatory Visit: Payer: Self-pay | Admitting: Physician Assistant

## 2020-02-21 DIAGNOSIS — E781 Pure hyperglyceridemia: Secondary | ICD-10-CM

## 2020-02-21 DIAGNOSIS — E782 Mixed hyperlipidemia: Secondary | ICD-10-CM

## 2020-02-21 DIAGNOSIS — I10 Essential (primary) hypertension: Secondary | ICD-10-CM

## 2020-02-21 DIAGNOSIS — E785 Hyperlipidemia, unspecified: Secondary | ICD-10-CM

## 2020-02-23 ENCOUNTER — Encounter: Payer: Self-pay | Admitting: Physician Assistant

## 2020-03-15 ENCOUNTER — Other Ambulatory Visit: Payer: Self-pay | Admitting: Physician Assistant

## 2020-03-15 DIAGNOSIS — E038 Other specified hypothyroidism: Secondary | ICD-10-CM

## 2020-03-20 ENCOUNTER — Other Ambulatory Visit: Payer: Self-pay | Admitting: Physician Assistant

## 2020-03-20 DIAGNOSIS — E781 Pure hyperglyceridemia: Secondary | ICD-10-CM

## 2020-03-20 DIAGNOSIS — I10 Essential (primary) hypertension: Secondary | ICD-10-CM

## 2020-03-20 DIAGNOSIS — E782 Mixed hyperlipidemia: Secondary | ICD-10-CM

## 2020-03-20 DIAGNOSIS — E785 Hyperlipidemia, unspecified: Secondary | ICD-10-CM

## 2020-03-27 ENCOUNTER — Other Ambulatory Visit: Payer: Self-pay | Admitting: Physician Assistant

## 2020-03-27 DIAGNOSIS — F5102 Adjustment insomnia: Secondary | ICD-10-CM

## 2020-04-12 ENCOUNTER — Ambulatory Visit (INDEPENDENT_AMBULATORY_CARE_PROVIDER_SITE_OTHER): Payer: PPO | Admitting: Physician Assistant

## 2020-04-12 ENCOUNTER — Other Ambulatory Visit: Payer: Self-pay

## 2020-04-12 ENCOUNTER — Encounter: Payer: Self-pay | Admitting: Physician Assistant

## 2020-04-12 VITALS — BP 118/71 | HR 82 | Temp 97.4°F | Ht 66.0 in | Wt 225.1 lb

## 2020-04-12 DIAGNOSIS — F339 Major depressive disorder, recurrent, unspecified: Secondary | ICD-10-CM | POA: Diagnosis not present

## 2020-04-12 DIAGNOSIS — R7303 Prediabetes: Secondary | ICD-10-CM | POA: Diagnosis not present

## 2020-04-12 DIAGNOSIS — L089 Local infection of the skin and subcutaneous tissue, unspecified: Secondary | ICD-10-CM

## 2020-04-12 DIAGNOSIS — I10 Essential (primary) hypertension: Secondary | ICD-10-CM

## 2020-04-12 MED ORDER — DOXYCYCLINE HYCLATE 100 MG PO TABS: 100.0000 mg | ORAL_TABLET | Freq: Two times a day (BID) | ORAL | 0 refills | Status: DC

## 2020-04-12 NOTE — Progress Notes (Signed)
Established Patient Office Visit  Subjective:  Patient ID: Shelly Hickman, female    DOB: 06-Feb-1941  Age: 80 y.o. MRN: [id number]  CC:  Chief Complaint  Patient presents with  . Hypertension  . Depression  . Prediabetes    HPI Shelly Hickman presents for follow up on hypertension, prediabetes and mood. Has c/o bump on right elbow which is tender, noticed 1 week ago. Tried to pop. Denies fever, chills, insect bite, injury or trauma. Was doing PT [facility]) for back pain and last session was in December and plans to resume (referred by Neurosurgery).  HTN: Pt denies chest pain, palpitations, or headache or leg swelling. Reports dizziness has improved since discontinuing diuretic. Taking medication as directed without side effects. Has not been checking BP at home. Pt follows a low salt diet. Is trying to stay hydrated.   Prediabetes: Asymptomatic. Continues to have caregiver stress. Is trying to monitor carbohydrates and sweets.   Mood: Reports a stressful family event over Christmas which really affected her mood and felt more down and depressed, which has improved. Feels more proactive and motivated to do things. Has not pursued BH therapy due to financial concerns.    Past Medical History:  Diagnosis Date  . Anemia   . Anxiety   . Arthritis   . Depression   . H/O hiatal hernia   . Hypercholesterolemia   . Hypertension   . Incontinence of urine   . Neuromuscular disorder ([place])   . Osteoarthritis     Past Surgical History:  Procedure Laterality Date  . 2 Synovial Cysts removed between L4-L5    . ABDOMINAL HYSTERECTOMY    . BACK SURGERY     back fusion after 2 back surgeries   . CARPAL TUNNEL RELEASE    . COLONOSCOPY    . EYE SURGERY     tube inserted for excessive tearing , tube is removed   . Right Knee Arthroscopy    . SALPINGOOPHORECTOMY  1993  . TONSILLECTOMY    . 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 TOTAL KNEE ARTHROPLASTY Right 03/04/2016   Procedure: TOTAL KNEE ARTHROPLASTY;  Surgeon: Elsie Saas, MD;  Location: [place];  Service: Orthopedics;  Laterality: Right;  . WISDOM TOOTH EXTRACTION      Family History  Problem Relation Age of Onset  . Hypertension Mother   . Diabetes Mother   . Stroke Mother   . Alcoholism Father   . Alcohol abuse Father   . Depression Sister     Social History   Socioeconomic History  . Marital status: Married    Spouse name: Not on file  . Number of children: Not on file  . Years of education: Not on file  . Highest education level: Not on file  Occupational History  . Not on file  Tobacco Use  . Smoking status: Former Smoker    Packs/day: 0.50    Types: Cigarettes    Start date: 02/19/1960    Quit date: 02/18/1965    Years since quitting: 55.1  . Smokeless tobacco: Never Used  Vaping Use  . Vaping Use: Never used  Substance and Sexual Activity  . Alcohol use: No  . Drug use: No  . Sexual activity: Not Currently  Other Topics Concern  . Not on file  Social History Narrative  . Not on  file   Social Determinants of Health   Financial Resource Strain: Not on file  Food Insecurity: Not on file  Transportation Needs: Not on file  Physical Activity: Not on file  Stress: Not on file  Social Connections: Not on file  Intimate Partner Violence: Not on file    Outpatient Medications Prior to Visit  Medication Sig Dispense Refill  . acetaminophen (TYLENOL) 325 MG tablet Take 2 tablets (650 mg total) by mouth every 6 (six) hours as needed for mild pain (or Fever >/= 101).    Marland Kitchen amLODipine (NORVASC) 5 MG tablet TAKE 1 TABLET BY MOUTH DAILY 30 tablet 0  . aspirin EC 81 MG tablet Take 81 mg by mouth daily.    . Calcium Carb-Cholecalciferol (CALCIUM 600-D PO) Take 1 tablet by mouth daily.    . Cholecalciferol (VITAMIN D-3) 5000 units TABS Take 5,000  Units by mouth daily.    . diphenhydrAMINE (BENADRYL) 25 mg capsule Take 25 mg by mouth every 6 (six) hours as needed.    . DULoxetine (CYMBALTA) 60 MG capsule TAKE 2 CAPSULES BY MOUTH DAILY 180 capsule 0  . fexofenadine (ALLEGRA) 180 MG tablet Take 1 tablet (180 mg total) by mouth daily. 90 tablet 1  . fluticasone (FLONASE) 50 MCG/ACT nasal spray 1 spray each nostril following sinus rinses twice daily 16 g 5  . gabapentin (NEURONTIN) 300 MG capsule TAKE 3 CAPSULES BY MOUTH 3 TIMES DAILY 810 capsule 0  . levothyroxine (SYNTHROID) 25 MCG tablet TAKE 1 TABLET BY MOUTH DAILY 90 tablet 0  . losartan (COZAAR) 100 MG tablet TAKE 1 TABLET BY MOUTH DAILY 90 tablet 0  . methocarbamol (ROBAXIN) 500 MG tablet Take 1 tablet (500 mg total) by mouth every 8 (eight) hours as needed for muscle spasms. 60 tablet 0  . nabumetone (RELAFEN) 500 MG tablet Take 500 mg by mouth 2 (two) times daily.    . niacin (NIASPAN) 1000 MG CR tablet TAKE 1 TABLET BY MOUTH EACH NIGHT AT BEDTIME 30 tablet 0  . omega-3 acid ethyl esters (LOVAZA) 1 g capsule TAKE 2 CAPSULES BY MOUTH TWICE DAILY 360 capsule 0  . oxybutynin (DITROPAN-XL) 10 MG 24 hr tablet Take 1 tablet (10 mg total) by mouth at bedtime. 90 tablet 1  . simvastatin (ZOCOR) 20 MG tablet TAKE 1 TABLET BY MOUTH EACH NIGHT AT BEDTIME 30 tablet 0  . traZODone (DESYREL) 100 MG tablet TAKE 1 TABLET BY MOUTH DAILY AT BEDTIME FOR SLEEP 90 tablet 0   No facility-administered medications prior to visit.    Allergies  Allergen Reactions  . Avelox [Moxifloxacin Hcl In Nacl] Other (See Comments)    CHEST PAIN OF UNSPECIFIED ORIGIN  . Cephalosporins Hives and Itching  . Bee Venom   . Other Itching and Swelling    UNSPECIFIED INSECT STINGS    ROS Review of Systems A fourteen system review of systems was performed and found to be positive as per HPI.   Objective:    Physical Exam General:  Well Developed, well nourished, in no acute distress   Neuro:  Alert and oriented,   extra-ocular muscles intact  HEENT:  Normocephalic, atraumatic, neck supple, no carotid bruits appreciated  Skin:  Approximately 0.5 cm x 0.5 cm firm nodule with central pustule with surrounding erythema over right elbow.  Cardiac:  RRR, S1 S2 wnl's Respiratory:  ECTA B/L w/o wheezing, Not using accessory muscles, speaking in full sentences- unlabored. Vascular:  Ext warm, no cyanosis apprec.; no edema. Psych:  No HI/SI, judgement and insight good, Euthymic mood. Full Affect.   BP 118/71   Pulse 82   Temp (!) 97.4 F (36.3 C)   Ht 5\' 6"  (1.676 m)   Wt 225 lb 1.6 oz (102.1 kg)   SpO2 94%   BMI 36.33 kg/m  Wt Readings from Last 3 Encounters:  04/12/20 225 lb 1.6 oz (102.1 kg)  01/07/20 226 lb 4.8 oz (102.6 kg)  11/05/19 223 lb 6.4 oz (101.3 kg)     Health Maintenance Due  Topic Date Due  . INFLUENZA VACCINE  09/19/2019  . COVID-19 Vaccine (3 - Booster for Pfizer series) 10/01/2019    There are no preventive care reminders to display for this patient.  Lab Results  Component Value Date   TSH 5.010 (H) 05/17/2019   Lab Results  Component Value Date   WBC 15.0 (H) 01/03/2020   HGB 13.4 01/03/2020   HCT 40.8 01/03/2020   MCV 87 01/03/2020   PLT 422 01/03/2020   Lab Results  Component Value Date   NA 134 04/12/2020   K 4.2 04/12/2020   CO2 21 04/12/2020   GLUCOSE 97 04/12/2020   BUN 13 04/12/2020   CREATININE 0.85 04/12/2020   BILITOT 0.2 04/12/2020   ALKPHOS 75 04/12/2020   AST 15 04/12/2020   ALT 11 04/12/2020   PROT 6.4 04/12/2020   ALBUMIN 4.0 04/12/2020   CALCIUM 9.4 04/12/2020   ANIONGAP 7 03/07/2016   Lab Results  Component Value Date   CHOL 179 05/17/2019   Lab Results  Component Value Date   HDL 63 05/17/2019   Lab Results  Component Value Date   LDLCALC 100 (H) 05/17/2019   Lab Results  Component Value Date   TRIG 86 05/17/2019   Lab Results  Component Value Date   CHOLHDL 2.8 05/17/2019   Lab Results  Component Value Date    HGBA1C 5.9 (H) 04/12/2020      Assessment & Plan:   Problem List Items Addressed This Visit      Cardiovascular and Mediastinum   Essential hypertension, benign (Chronic)   Relevant Orders   Comprehensive metabolic panel (Completed)     Other   Prediabetes (Chronic)   Relevant Orders   Hemoglobin A1c (Completed)   Depression, recurrent ([place])    Other Visit Diagnoses    Infected skin lesion    -  Primary   Relevant Medications   doxycycline (VIBRA-TABS) 100 MG tablet     Recurrent Depression: -PHQ-9 score of 3, stable. Denies SI/HI. -Continue current medication regimen. -Recommend to incorporate nonpharmacologic therapy such as mindfulness therapy and exercise.  -Will continue to monitor.  Essential HTN, benign: - BP controlled.. - Continue current medication regimen. - Follow low sodium diet and stay well hydrated. - Will collect CMP for medication monitoring.  Prediabetes: -Last A1c 6.3, stable. -Will repeat A1c today. -Follow low carbohydrate and glucose diet.  Infected skin lesion: -Erythema, tenderness and central pustule concerning for infection so will start antibiotic therapy. If symptoms fail to improve or worsen recommend dermatology referral. Patient verbalized understanding.    Of note, advised patient to contact PT office about resuming PT and if new order was needed to contact Neurosurgeon.   Meds ordered this encounter  Medications  . doxycycline (VIBRA-TABS) 100 MG tablet    Sig: Take 1 tablet (100 mg total) by mouth 2 (two) times daily.    Dispense:  20 tablet    Refill:  0    Follow-up:  Return in about 3 months (around 07/10/2020) for Pre-DM, HTN, HLD, thyroid.   Note:  This note was prepared with assistance of Dragon voice recognition software. Occasional wrong-word or sound-a-like substitutions may have occurred due to the inherent limitations of voice recognition software.  Lorrene Reid, PA-C

## 2020-04-12 NOTE — Patient Instructions (Signed)
Prediabetes Eating Plan Prediabetes is a condition that causes blood sugar (glucose) levels to be higher than normal. This increases the risk for developing type 2 diabetes (type 2 diabetes mellitus). Working with a health care provider or nutrition specialist (dietitian) to make diet and lifestyle changes can help prevent the onset of diabetes. These changes may help you:  Control your blood glucose levels.  Improve your cholesterol levels.  Manage your blood pressure. What are tips for following this plan? Reading food labels  Read food labels to check the amount of fat, salt (sodium), and sugar in prepackaged foods. Avoid foods that have: ? Saturated fats. ? Trans fats. ? Added sugars.  Avoid foods that have more than 300 milligrams (mg) of sodium per serving. Limit your sodium intake to less than 2,300 mg each day. Shopping  Avoid buying pre-made and processed foods.  Avoid buying drinks with added sugar. Cooking  Cook with olive oil. Do not use butter, lard, or ghee.  Bake, broil, grill, steam, or boil foods. Avoid frying. Meal planning  Work with your dietitian to create an eating plan that is right for you. This may include tracking how many calories you take in each day. Use a food diary, notebook, or mobile application to track what you eat at each meal.  Consider following a Mediterranean diet. This includes: ? Eating several servings of fresh fruits and vegetables each day. ? Eating fish at least twice a week. ? Eating one serving each day of whole grains, beans, nuts, and seeds. ? Using olive oil instead of other fats. ? Limiting alcohol. ? Limiting red meat. ? Using nonfat or low-fat dairy products.  Consider following a plant-based diet. This includes dietary choices that focus on eating mostly vegetables and fruit, grains, beans, nuts, and seeds.  If you have high blood pressure, you may need to limit your sodium intake or follow a diet such as the DASH  (Dietary Approaches to Stop Hypertension) eating plan. The DASH diet aims to lower high blood pressure.   Lifestyle  Set weight loss goals with help from your health care team. It is recommended that most people with prediabetes lose 7% of their body weight.  Exercise for at least 30 minutes 5 or more days a week.  Attend a support group or seek support from a mental health counselor.  Take over-the-counter and prescription medicines only as told by your health care provider. What foods are recommended? Fruits Berries. Bananas. Apples. Oranges. Grapes. Papaya. Mango. Pomegranate. Kiwi. Grapefruit. Cherries. Vegetables Lettuce. Spinach. Peas. Beets. Cauliflower. Cabbage. Broccoli. Carrots. Tomatoes. Squash. Eggplant. Herbs. Peppers. Onions. Cucumbers. Brussels sprouts. Grains Whole grains, such as whole-wheat or whole-grain breads, crackers, cereals, and pasta. Unsweetened oatmeal. Bulgur. Barley. Quinoa. Brown rice. Corn or whole-wheat flour tortillas or taco shells. Meats and other proteins Seafood. Poultry without skin. Lean cuts of pork and beef. Tofu. Eggs. Nuts. Beans. Dairy Low-fat or fat-free dairy products, such as yogurt, cottage cheese, and cheese. Beverages Water. Tea. Coffee. Sugar-free or diet soda. Seltzer water. Low-fat or nonfat milk. Milk alternatives, such as soy or almond milk. Fats and oils Olive oil. Canola oil. Sunflower oil. Grapeseed oil. Avocado. Walnuts. Sweets and desserts Sugar-free or low-fat pudding. Sugar-free or low-fat ice cream and other frozen treats. Seasonings and condiments Herbs. Sodium-free spices. Mustard. Relish. Low-salt, low-sugar ketchup. Low-salt, low-sugar barbecue sauce. Low-fat or fat-free mayonnaise. The items listed above may not be a complete list of recommended foods and beverages. Contact a dietitian for more   information. What foods are not recommended? Fruits Fruits canned with syrup. Vegetables Canned vegetables. Frozen  vegetables with butter or cream sauce. Grains Refined white flour and flour products, such as bread, pasta, snack foods, and cereals. Meats and other proteins Fatty cuts of meat. Poultry with skin. Breaded or fried meat. Processed meats. Dairy Full-fat yogurt, cheese, or milk. Beverages Sweetened drinks, such as iced tea and soda. Fats and oils Butter. Lard. Ghee. Sweets and desserts Baked goods, such as cake, cupcakes, pastries, cookies, and cheesecake. Seasonings and condiments Spice mixes with added salt. Ketchup. Barbecue sauce. Mayonnaise. The items listed above may not be a complete list of foods and beverages that are not recommended. Contact a dietitian for more information. Where to find more information  American Diabetes Association: www.diabetes.org Summary  You may need to make diet and lifestyle changes to help prevent the onset of diabetes. These changes can help you control blood sugar, improve cholesterol levels, and manage blood pressure.  Set weight loss goals with help from your health care team. It is recommended that most people with prediabetes lose 7% of their body weight.  Consider following a Mediterranean diet. This includes eating plenty of fresh fruits and vegetables, whole grains, beans, nuts, seeds, fish, and low-fat dairy, and using olive oil instead of other fats. This information is not intended to replace advice given to you by your health care provider. Make sure you discuss any questions you have with your health care provider. Document Revised: 05/06/2019 Document Reviewed: 05/06/2019 Elsevier Patient Education  2021 Elsevier Inc.  

## 2020-04-13 LAB — COMPREHENSIVE METABOLIC PANEL
ALT: 11 IU/L (ref 0–32)
AST: 15 IU/L (ref 0–40)
Albumin/Globulin Ratio: 1.7 (ref 1.2–2.2)
Albumin: 4 g/dL (ref 3.7–4.7)
Alkaline Phosphatase: 75 IU/L (ref 44–121)
BUN/Creatinine Ratio: 15 (ref 12–28)
BUN: 13 mg/dL (ref 8–27)
Bilirubin Total: 0.2 mg/dL (ref 0.0–1.2)
CO2: 21 mmol/L (ref 20–29)
Calcium: 9.4 mg/dL (ref 8.7–10.3)
Chloride: 99 mmol/L (ref 96–106)
Creatinine, Ser: 0.85 mg/dL (ref 0.57–1.00)
GFR calc Af Amer: 76 mL/min/{1.73_m2} (ref >59)
GFR calc non Af Amer: 66 mL/min/{1.73_m2} (ref >59)
Globulin, Total: 2.4 g/dL (ref 1.5–4.5)
Glucose: 97 mg/dL (ref 65–99)
Potassium: 4.2 mmol/L (ref 3.5–5.2)
Sodium: 134 mmol/L (ref 134–144)
Total Protein: 6.4 g/dL (ref 6.0–8.5)

## 2020-04-13 LAB — HEMOGLOBIN A1C
Est. average glucose Bld gHb Est-mCnc: 123 mg/dL
Hgb A1c MFr Bld: 5.9 % — ABNORMAL HIGH (ref 4.8–5.6)

## 2020-04-19 ENCOUNTER — Other Ambulatory Visit: Payer: Self-pay | Admitting: Physician Assistant

## 2020-04-21 ENCOUNTER — Other Ambulatory Visit: Payer: Self-pay | Admitting: Physician Assistant

## 2020-04-21 DIAGNOSIS — E781 Pure hyperglyceridemia: Secondary | ICD-10-CM

## 2020-04-21 DIAGNOSIS — E782 Mixed hyperlipidemia: Secondary | ICD-10-CM

## 2020-04-21 DIAGNOSIS — E785 Hyperlipidemia, unspecified: Secondary | ICD-10-CM

## 2020-04-21 DIAGNOSIS — I10 Essential (primary) hypertension: Secondary | ICD-10-CM

## 2020-04-25 ENCOUNTER — Other Ambulatory Visit: Payer: Self-pay | Admitting: Physician Assistant

## 2020-05-09 ENCOUNTER — Other Ambulatory Visit: Payer: Self-pay

## 2020-05-09 ENCOUNTER — Encounter: Payer: Self-pay | Admitting: Physician Assistant

## 2020-05-09 ENCOUNTER — Ambulatory Visit (INDEPENDENT_AMBULATORY_CARE_PROVIDER_SITE_OTHER): Payer: PPO | Admitting: Physician Assistant

## 2020-05-09 ENCOUNTER — Telehealth: Payer: Self-pay | Admitting: Physician Assistant

## 2020-05-09 VITALS — BP 127/86 | HR 88 | Temp 97.0°F | Ht 66.0 in | Wt 222.0 lb

## 2020-05-09 DIAGNOSIS — R229 Localized swelling, mass and lump, unspecified: Secondary | ICD-10-CM | POA: Diagnosis not present

## 2020-05-09 DIAGNOSIS — L304 Erythema intertrigo: Secondary | ICD-10-CM

## 2020-05-09 MED ORDER — KETOCONAZOLE 2 % EX CREA: 1.0000 "application " | TOPICAL_CREAM | Freq: Every day | CUTANEOUS | 0 refills | Status: DC

## 2020-05-09 MED ORDER — FLUTICASONE PROPIONATE 0.05 % EX CREA: TOPICAL_CREAM | Freq: Every day | CUTANEOUS | 0 refills | Status: DC

## 2020-05-09 NOTE — Telephone Encounter (Signed)
Medications sent.  Thank you, Herb Grays

## 2020-05-09 NOTE — Telephone Encounter (Signed)
Patient called back with the two medications she would like called into [facility].  They are Fluticasone cream .05% Ketoconazole 2% cream. Thanks.

## 2020-05-09 NOTE — Patient Instructions (Signed)
Intertrigo Intertrigo is skin irritation (inflammation) that happens in warm, moist areas of the body. The irritation can cause a rash and make skin raw and itchy. The rash is usually pink or red. It happens mostly between folds of skin or where skin rubs together, such as:  Between the toes.  In the armpits.  In the groin area.  Under the belly.  Under the breasts.  Around the butt area. This condition is not passed from person to person (is not contagious). What are the causes?  Heat, moisture, rubbing, and not enough air movement.  The condition can be made worse by: ? Sweat. ? Bacteria. ? A fungus, such as yeast. What increases the risk?  Moisture in your skin folds.  You are more likely to develop this condition if you: ? Have diabetes. ? Are overweight. ? Are not able to move around. ? Live in a warm and moist climate. ? Wear splints, braces, or other medical devices. ? Are not able to control your pee (urine) or poop (stool). What are the signs or symptoms?  A pink or red skin rash in the skin fold or near the skin fold.  Raw or scaly skin.  Itching.  A burning feeling.  Bleeding.  Leaking fluid.  A bad smell. How is this treated?  Cleaning and drying your skin.  Taking an antibiotic medicine or using an antibiotic skin cream for a bacterial infection.  Using an antifungal cream on your skin or taking pills for an infection that was caused by a fungus, such as yeast.  Using a steroid ointment to stop the itching and irritation.  Separating the skin fold with a clean cotton cloth to absorb moisture and allow air to flow into the area. Follow these instructions at home:  Keep the affected area clean and dry.  Do not scratch your skin.  Stay cool as much as you can. Use an air conditioner or a fan, if you have one.  Apply over-the-counter and prescription medicines only as told by your doctor.  If you were prescribed an antibiotic medicine,  use it as told by your doctor. Do not stop using the antibiotic even if your condition starts to get better.  Keep all follow-up visits as told by your doctor. This is important. How is this prevented?  Stay at a healthy weight.  Take care of your feet. This is very important if you have diabetes. You should: ? Wear shoes that fit well. ? Keep your feet dry. ? Wear clean cotton or wool socks.  Protect the skin in your groin and butt area as told by your doctor. To do this: ? Follow a regular cleaning routine. ? Use creams, powders, or ointments that protect your skin. ? Change protection pads often.  Do not wear tight clothes. Wear clothes that: ? Are loose. ? Take moisture away from your body. ? Are made of cotton.  Wear a bra that gives good support, if needed.  Shower and dry yourself well after being active. Use a hair dryer on a cool setting to dry between skin folds.  Keep your blood sugar under control if you have diabetes.   Contact a doctor if:  Your symptoms do not get better with treatment.  Your symptoms get worse or they spread.  You notice more redness and warmth.  You have a fever. Summary  Intertrigo is skin irritation that occurs when folds of skin rub together.  This condition is caused by heat,   moisture, and rubbing.  This condition may be treated by cleaning and drying your skin and with medicines.  Apply over-the-counter and prescription medicines only as told by your doctor.  Keep all follow-up visits as told by your doctor. This is important. This information is not intended to replace advice given to you by your health care provider. Make sure you discuss any questions you have with your health care provider. Document Revised: 11/13/2017 Document Reviewed: 11/13/2017 Elsevier Patient Education  2021 Elsevier Inc.  

## 2020-05-09 NOTE — Progress Notes (Signed)
Acute Office Visit  Subjective:    Patient ID: Shelly Hickman, female    DOB: 06/24/1940, 80 y.o.   MRN: [id number]  Chief Complaint  Patient presents with  . Follow-up  . Skin lesion    HPI Patient is in today for skin nodule on right elbow which failed to improve with antibiotic therapy. Feels like it has grew in size. No new symptoms. Denies fever, chills, or night sweats. Tried to pop it but it was painful and stopped. Also has a rash underneath breasts. States uses two creams that were prescribed by dermatology but needs refill.  Past Medical History:  Diagnosis Date  . Anemia   . Anxiety   . Arthritis   . Depression   . H/O hiatal hernia   . Hypercholesterolemia   . Hypertension   . Incontinence of urine   . Neuromuscular disorder ([place])   . Osteoarthritis     Past Surgical History:  Procedure Laterality Date  . 2 Synovial Cysts removed between L4-L5    . ABDOMINAL HYSTERECTOMY    . BACK SURGERY     back fusion after 2 back surgeries   . CARPAL TUNNEL RELEASE    . COLONOSCOPY    . EYE SURGERY     tube inserted for excessive tearing , tube is removed   . Right Knee Arthroscopy    . SALPINGOOPHORECTOMY  1993  . TONSILLECTOMY    . 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 TOTAL KNEE ARTHROPLASTY Right 03/04/2016   Procedure: TOTAL KNEE ARTHROPLASTY;  Surgeon: Elsie Saas, MD;  Location: [facility];  Service: Orthopedics;  Laterality: Right;  . WISDOM TOOTH EXTRACTION      Family History  Problem Relation Age of Onset  . Hypertension Mother   . Diabetes Mother   . Stroke Mother   . Alcoholism Father   . Alcohol abuse Father   . Depression Sister     Social History   Socioeconomic History  . Marital status: Married    Spouse name: Not on file  . Number of children: Not on file  . Years of education: Not on file  . Highest  education level: Not on file  Occupational History  . Not on file  Tobacco Use  . Smoking status: Former Smoker    Packs/day: 0.50    Types: Cigarettes    Start date: 02/19/1960    Quit date: 02/18/1965    Years since quitting: 55.2  . Smokeless tobacco: Never Used  Vaping Use  . Vaping Use: Never used  Substance and Sexual Activity  . Alcohol use: No  . Drug use: No  . Sexual activity: Not Currently  Other Topics Concern  . Not on file  Social History Narrative  . Not on file   Social Determinants of Health   Financial Resource Strain: Not on file  Food Insecurity: Not on file  Transportation Needs: Not on file  Physical Activity: Not on file  Stress: Not on file  Social Connections: Not on file  Intimate Partner Violence: Not on file    Outpatient Medications Prior to Visit  Medication Sig Dispense Refill  . acetaminophen (TYLENOL) 325 MG tablet Take 2 tablets (650 mg total) by mouth every 6 (six) hours as needed for mild pain (or Fever >/= 101).    Marland Kitchen amLODipine (NORVASC) 5 MG tablet TAKE  1 TABLET BY MOUTH DAILY 90 tablet 0  . aspirin EC 81 MG tablet Take 81 mg by mouth daily.    . Calcium Carb-Cholecalciferol (CALCIUM 600-D PO) Take 1 tablet by mouth daily.    . Cholecalciferol (VITAMIN D-3) 5000 units TABS Take 5,000 Units by mouth daily.    . diphenhydrAMINE (BENADRYL) 25 mg capsule Take 25 mg by mouth every 6 (six) hours as needed.    . doxycycline (VIBRA-TABS) 100 MG tablet Take 1 tablet (100 mg total) by mouth 2 (two) times daily. 20 tablet 0  . DULoxetine (CYMBALTA) 60 MG capsule TAKE 2 CAPSULES BY MOUTH DAILY 180 capsule 0  . fexofenadine (ALLEGRA) 180 MG tablet Take 1 tablet (180 mg total) by mouth daily. 90 tablet 1  . fluticasone (FLONASE) 50 MCG/ACT nasal spray 1 spray each nostril following sinus rinses twice daily 16 g 5  . gabapentin (NEURONTIN) 300 MG capsule TAKE 3 CAPSULES BY MOUTH 3 TIMES DAILY 810 capsule 0  . levothyroxine (SYNTHROID) 25 MCG tablet  TAKE 1 TABLET BY MOUTH DAILY 90 tablet 0  . losartan (COZAAR) 100 MG tablet TAKE 1 TABLET BY MOUTH DAILY 90 tablet 0  . methocarbamol (ROBAXIN) 500 MG tablet Take 1 tablet (500 mg total) by mouth every 8 (eight) hours as needed for muscle spasms. 60 tablet 0  . nabumetone (RELAFEN) 500 MG tablet Take 500 mg by mouth 2 (two) times daily.    . niacin (NIASPAN) 1000 MG CR tablet TAKE 1 TABLET BY MOUTH EACH NIGHT AT BEDTIME 90 tablet 0  . omega-3 acid ethyl esters (LOVAZA) 1 g capsule TAKE 2 CAPSULES BY MOUTH TWICE DAILY 360 capsule 0  . oxybutynin (DITROPAN-XL) 10 MG 24 hr tablet Take 1 tablet (10 mg total) by mouth at bedtime. 90 tablet 1  . simvastatin (ZOCOR) 20 MG tablet TAKE 1 TABLET BY MOUTH EACH NIGHT AT BEDTIME 90 tablet 0  . traZODone (DESYREL) 100 MG tablet TAKE 1 TABLET BY MOUTH DAILY AT BEDTIME FOR SLEEP 90 tablet 0   No facility-administered medications prior to visit.    Allergies  Allergen Reactions  . Avelox [Moxifloxacin Hcl In Nacl] Other (See Comments)    CHEST PAIN OF UNSPECIFIED ORIGIN  . Cephalosporins Hives and Itching  . Bee Venom   . Other Itching and Swelling    UNSPECIFIED INSECT STINGS    Review of Systems  A fourteen system review of systems was performed and found to be positive as per HPI.    Objective:    Physical Exam General:  Well Developed, well nourished, in no acute distress   Neuro:  Alert and oriented,  extra-ocular muscles intact  HEENT:  Normocephalic, atraumatic, neck supple Skin: Firm approximately 1.5 cm x 1.3 cm nodule over right elbow. Erythematous red plaques at skin folds underneath both breast. Cardiac:  RRR Respiratory: Not using accessory muscles, speaking in full sentences- unlabored. Vascular:  Ext warm, no cyanosis apprec. Psych:  No HI/SI, judgement and insight good, Euthymic mood. Full Affect.  BP 127/86 (BP Location: Left Wrist, Patient Position: Sitting)   Pulse 88   Temp (!) 97 F (36.1 C)   Ht 5\' 6"  (1.676 m)   Wt  222 lb (100.7 kg)   SpO2 95%   BMI 35.83 kg/m  Wt Readings from Last 3 Encounters:  05/09/20 222 lb (100.7 kg)  04/12/20 225 lb 1.6 oz (102.1 kg)  01/07/20 226 lb 4.8 oz (102.6 kg)    Health Maintenance Due  Topic  Date Due  . COVID-19 Vaccine (3 - Booster for Pfizer series) 10/01/2019    There are no preventive care reminders to display for this patient.   Lab Results  Component Value Date   TSH 5.010 (H) 05/17/2019   Lab Results  Component Value Date   WBC 15.0 (H) 01/03/2020   HGB 13.4 01/03/2020   HCT 40.8 01/03/2020   MCV 87 01/03/2020   PLT 422 01/03/2020   Lab Results  Component Value Date   NA 134 04/12/2020   K 4.2 04/12/2020   CO2 21 04/12/2020   GLUCOSE 97 04/12/2020   BUN 13 04/12/2020   CREATININE 0.85 04/12/2020   BILITOT 0.2 04/12/2020   ALKPHOS 75 04/12/2020   AST 15 04/12/2020   ALT 11 04/12/2020   PROT 6.4 04/12/2020   ALBUMIN 4.0 04/12/2020   CALCIUM 9.4 04/12/2020   ANIONGAP 7 03/07/2016   Lab Results  Component Value Date   CHOL 179 05/17/2019   Lab Results  Component Value Date   HDL 63 05/17/2019   Lab Results  Component Value Date   LDLCALC 100 (H) 05/17/2019   Lab Results  Component Value Date   TRIG 86 05/17/2019   Lab Results  Component Value Date   CHOLHDL 2.8 05/17/2019   Lab Results  Component Value Date   HGBA1C 5.9 (H) 04/12/2020       Assessment & Plan:   Problem List Items Addressed This Visit   None   Visit Diagnoses    Skin nodule    -  Primary   Intertrigo         Skin nodule: -Nodule dimensions are larger than previous visit so will place referral to dermatology. Advised to monitor for red flag signs and symptoms.  Intertrigo: -Advised patient to notify the office of topical creams she uses and will send new rx's.  -Recommend to keep area clean and dry.   No orders of the defined types were placed in this encounter.    Lorrene Reid, PA-C

## 2020-05-23 DIAGNOSIS — L820 Inflamed seborrheic keratosis: Secondary | ICD-10-CM | POA: Diagnosis not present

## 2020-05-23 DIAGNOSIS — Z85828 Personal history of other malignant neoplasm of skin: Secondary | ICD-10-CM | POA: Diagnosis not present

## 2020-05-23 DIAGNOSIS — L565 Disseminated superficial actinic porokeratosis (DSAP): Secondary | ICD-10-CM | POA: Diagnosis not present

## 2020-05-23 DIAGNOSIS — C44622 Squamous cell carcinoma of skin of right upper limb, including shoulder: Secondary | ICD-10-CM | POA: Diagnosis not present

## 2020-06-01 DIAGNOSIS — L649 Androgenic alopecia, unspecified: Secondary | ICD-10-CM | POA: Diagnosis not present

## 2020-06-01 DIAGNOSIS — C44622 Squamous cell carcinoma of skin of right upper limb, including shoulder: Secondary | ICD-10-CM | POA: Diagnosis not present

## 2020-06-13 DIAGNOSIS — C44622 Squamous cell carcinoma of skin of right upper limb, including shoulder: Secondary | ICD-10-CM | POA: Diagnosis not present

## 2020-06-13 DIAGNOSIS — L988 Other specified disorders of the skin and subcutaneous tissue: Secondary | ICD-10-CM | POA: Diagnosis not present

## 2020-06-21 ENCOUNTER — Other Ambulatory Visit: Payer: Self-pay | Admitting: Physician Assistant

## 2020-06-21 DIAGNOSIS — F5102 Adjustment insomnia: Secondary | ICD-10-CM

## 2020-07-04 ENCOUNTER — Other Ambulatory Visit: Payer: Self-pay | Admitting: Physician Assistant

## 2020-07-04 DIAGNOSIS — M545 Low back pain, unspecified: Secondary | ICD-10-CM | POA: Diagnosis not present

## 2020-07-04 DIAGNOSIS — E038 Other specified hypothyroidism: Secondary | ICD-10-CM

## 2020-07-04 DIAGNOSIS — M47816 Spondylosis without myelopathy or radiculopathy, lumbar region: Secondary | ICD-10-CM | POA: Diagnosis not present

## 2020-07-10 ENCOUNTER — Other Ambulatory Visit: Payer: Self-pay

## 2020-07-10 ENCOUNTER — Encounter: Payer: Self-pay | Admitting: Physician Assistant

## 2020-07-10 ENCOUNTER — Ambulatory Visit (INDEPENDENT_AMBULATORY_CARE_PROVIDER_SITE_OTHER): Payer: PPO | Admitting: Physician Assistant

## 2020-07-10 VITALS — BP 107/67 | HR 84 | Temp 98.8°F | Ht 66.0 in | Wt 221.8 lb

## 2020-07-10 DIAGNOSIS — Z6835 Body mass index (BMI) 35.0-35.9, adult: Secondary | ICD-10-CM

## 2020-07-10 DIAGNOSIS — W19XXXA Unspecified fall, initial encounter: Secondary | ICD-10-CM

## 2020-07-10 DIAGNOSIS — I10 Essential (primary) hypertension: Secondary | ICD-10-CM

## 2020-07-10 DIAGNOSIS — Z636 Dependent relative needing care at home: Secondary | ICD-10-CM

## 2020-07-10 DIAGNOSIS — R7303 Prediabetes: Secondary | ICD-10-CM | POA: Diagnosis not present

## 2020-07-10 DIAGNOSIS — F339 Major depressive disorder, recurrent, unspecified: Secondary | ICD-10-CM

## 2020-07-10 DIAGNOSIS — E038 Other specified hypothyroidism: Secondary | ICD-10-CM
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
## 2020-07-10 NOTE — Patient Instructions (Signed)
Managing Stress, Adult Feeling a certain amount of stress is normal. Stress helps our body and mind get ready to deal with the demands of life. Stress hormones can motivate you to do well at work and meet your responsibilities. However severe or long-lasting (chronic) stress can affect your mental and physical health. Chronic stress puts you at higher risk for anxiety, depression, and other health problems like digestive problems, muscle aches, heart disease, high blood pressure, and stroke. What are the causes? Common causes of stress include:  Demands from work, such as deadlines, feeling overworked, or having long hours.  Pressures at home, such as money issues, disagreements with a spouse, or parenting issues.  Pressures from major life changes, such as divorce, moving, loss of a loved one, or chronic illness. You may be at higher risk for stress-related problems if you do not get enough sleep, are in poor health, do not have emotional support, or have a mental health disorder like anxiety or depression. How to recognize stress Stress can make you:  Have trouble sleeping.  Feel sad, anxious, irritable, or overwhelmed.  Lose your appetite.  Overeat or want to eat unhealthy foods.  Want to use drugs or alcohol. Stress can also cause physical symptoms, such as:  Sore, tense muscles, especially in the shoulders and neck.  Headaches.  Trouble breathing.  A faster heart rate.  Stomach pain, nausea, or vomiting.  Diarrhea or constipation.  Trouble concentrating. Follow these instructions at home: Lifestyle  Identify the source of your stress and your reaction to it. See a therapist who can help you change your reactions.  When there are stressful events: ? Talk about it with family, friends, or co-workers. ? Try to think realistically about stressful events and not ignore them or overreact. ? Try to find the positives in a stressful situation and not focus on the  negatives. ? Cut back on responsibilities at work and home, if possible. Ask for help from friends or family members if you need it.  Find ways to cope with stress, such as: ? Meditation. ? Deep breathing. ? Yoga or tai chi. ? Progressive muscle relaxation. ? Doing art, playing music, or reading. ? Making time for fun activities. ? Spending time with family and friends.  Get support from family, friends, or spiritual resources. Eating and drinking  Eat a healthy diet. This includes: ? Eating foods that are high in fiber, such as beans, whole grains, and fresh fruits and vegetables. ? Limiting foods that are high in fat and processed sugars, such as fried and sweet foods.  Do not skip meals or overeat.  Drink enough fluid to keep your urine pale yellow. Alcohol use  Do not drink alcohol if: ? Your health care provider tells you not to drink. ? You are pregnant, may be pregnant, or are planning to become pregnant.  Drinking alcohol is a way some people try to ease their stress. This can be dangerous, so if you drink alcohol: ? Limit how much you use to:  0-1 drink a day for women.  0-2 drinks a day for men. ? Be aware of how much alcohol is in your drink. In the [place] one drink equals one 12 oz bottle of beer (355 mL), one 5 oz glass of wine (148 mL), or one 1 oz glass of hard liquor (44 mL). Activity  Include 30 minutes of exercise in your daily schedule. Exercise is a good stress reducer.  Include time in your day for   an activity that you find relaxing. Try taking a walk, going on a bike ride, reading a book, or listening to music.  Schedule your time in a way that lowers stress, and keep a consistent schedule. Prioritize what is most important to get done.   General instructions  Get enough sleep. Try to go to sleep and get up at about the same time every day.  Take over-the-counter and prescription medicines only as told by your health care provider.  Do not use any  products that contain nicotine or tobacco, such as cigarettes, e-cigarettes, and chewing tobacco. If you need help quitting, ask your health care provider.  Do not use drugs or smoke to cope with stress.  Keep all follow-up visits as told by your health care provider. This is important. Where to find support  Talk with your health care provider about stress management or finding a support group.  Find a therapist to work with you on your stress management techniques. Contact a health care provider if:  Your stress symptoms get worse.  You are unable to manage your stress at home.  You are struggling to stop using drugs or alcohol. Get help right away if:  You may be a danger to yourself or others.  You have any thoughts of death or suicide. If you ever feel like you may hurt yourself or others, or have thoughts about taking your own life, get help right away. You can go to your nearest emergency department or call:  Your local emergency services (911 in the [place]  A suicide crisis helpline, such as the National Suicide Prevention Lifeline at 1-800-273-8255. This is open 24 hours a day. Summary  Feeling a certain amount of stress is normal, but severe or long-lasting (chronic) stress can affect your mental and physical health.  Chronic stress can put you at higher risk for anxiety, depression, and other health problems like digestive problems, muscle aches, heart disease, high blood pressure, and stroke.  You may be at higher risk for stress-related problems if you do not get enough sleep, are in poor health, lack emotional support, or have a mental health disorder like anxiety or depression.  Identify the source of your stress and your reaction to it. Try talking about stressful events with family, friends, or co-workers, finding a coping method, or getting support from spiritual resources.  If you need more help, talk with your health care provider about finding a support group  or a mental health therapist. This information is not intended to replace advice given to you by your health care provider. Make sure you discuss any questions you have with your health care provider. Document Revised: 09/02/2018 Document Reviewed: 09/02/2018 Elsevier Patient Education  2021 Elsevier Inc.  

## 2020-07-11 DIAGNOSIS — M47816 Spondylosis without myelopathy or radiculopathy, lumbar region: Secondary | ICD-10-CM | POA: Diagnosis not present

## 2020-07-11 DIAGNOSIS — M545 Low back pain, unspecified: Secondary | ICD-10-CM | POA: Diagnosis not present

## 2020-07-11 LAB — CBC WITH DIFFERENTIAL/PLATELET
Basophils Absolute: 0.1 10*3/uL (ref 0.0–0.2)
Basos: 1 %
EOS (ABSOLUTE): 0.6 10*3/uL — ABNORMAL HIGH (ref 0.0–0.4)
Eos: 5 %
Hematocrit: 40.2 % (ref 34.0–46.6)
Hemoglobin: 13.1 g/dL (ref 11.1–15.9)
Immature Grans (Abs): 0 10*3/uL (ref 0.0–0.1)
Immature Granulocytes: 0 %
Lymphocytes Absolute: 2.2 10*3/uL (ref 0.7–3.1)
Lymphs: 19 %
MCH: 28.5 pg (ref 26.6–33.0)
MCHC: 32.6 g/dL (ref 31.5–35.7)
MCV: 87 fL (ref 79–97)
Monocytes Absolute: 1.1 10*3/uL — ABNORMAL HIGH (ref 0.1–0.9)
Monocytes: 10 %
Neutrophils Absolute: 7.5 10*3/uL — ABNORMAL HIGH (ref 1.4–7.0)
Neutrophils: 65 %
Platelets: 359 10*3/uL (ref 150–450)
RBC: 4.6 x10E6/uL (ref 3.77–5.28)
RDW: 13 % (ref 11.7–15.4)
WBC: 11.5 10*3/uL — ABNORMAL HIGH (ref 3.4–10.8)

## 2020-07-11 LAB — COMPREHENSIVE METABOLIC PANEL
ALT: 18 IU/L (ref 0–32)
AST: 22 IU/L (ref 0–40)
Albumin/Globulin Ratio: 1.9 (ref 1.2–2.2)
Albumin: 4.3 g/dL (ref 3.7–4.7)
Alkaline Phosphatase: 77 IU/L (ref 44–121)
BUN/Creatinine Ratio: 15 (ref 12–28)
BUN: 13 mg/dL (ref 8–27)
Bilirubin Total: 0.4 mg/dL (ref 0.0–1.2)
CO2: 23 mmol/L (ref 20–29)
Calcium: 9.5 mg/dL (ref 8.7–10.3)
Chloride: 101 mmol/L (ref 96–106)
Creatinine, Ser: 0.87 mg/dL (ref 0.57–1.00)
Globulin, Total: 2.3 g/dL (ref 1.5–4.5)
Glucose: 92 mg/dL (ref 65–99)
Potassium: 4.2 mmol/L (ref 3.5–5.2)
Sodium: 141 mmol/L (ref 134–144)
Total Protein: 6.6 g/dL (ref 6.0–8.5)
eGFR: 68 mL/min/{1.73_m2} (ref >59)

## 2020-07-11 LAB — TSH: TSH: 4.02 u[IU]/mL (ref 0.450–4.500)

## 2020-07-13 DIAGNOSIS — M545 Low back pain, unspecified: Secondary | ICD-10-CM | POA: Diagnosis not present

## 2020-07-13 DIAGNOSIS — M47816 Spondylosis without myelopathy or radiculopathy, lumbar region: Secondary | ICD-10-CM | POA: Diagnosis not present

## 2020-07-15 ENCOUNTER — Other Ambulatory Visit: Payer: Self-pay | Admitting: Physician Assistant

## 2020-07-17 ENCOUNTER — Other Ambulatory Visit: Payer: Self-pay | Admitting: Physician Assistant

## 2020-07-17 DIAGNOSIS — I10 Essential (primary) hypertension: Secondary | ICD-10-CM

## 2020-07-17 DIAGNOSIS — E782 Mixed hyperlipidemia: Secondary | ICD-10-CM

## 2020-07-19 DIAGNOSIS — M47816 Spondylosis without myelopathy or radiculopathy, lumbar region: Secondary | ICD-10-CM | POA: Diagnosis not present

## 2020-07-19 DIAGNOSIS — M545 Low back pain, unspecified: Secondary | ICD-10-CM | POA: Diagnosis not present

## 2020-07-21 DIAGNOSIS — M47816 Spondylosis without myelopathy or radiculopathy, lumbar region: Secondary | ICD-10-CM | POA: Diagnosis not present

## 2020-07-21 DIAGNOSIS — M545 Low back pain, unspecified: Secondary | ICD-10-CM | POA: Diagnosis not present

## 2020-07-26 DIAGNOSIS — M545 Low back pain, unspecified: Secondary | ICD-10-CM | POA: Diagnosis not present

## 2020-07-26 DIAGNOSIS — M47816 Spondylosis without myelopathy or radiculopathy, lumbar region: Secondary | ICD-10-CM | POA: Diagnosis not present

## 2020-07-28 DIAGNOSIS — M545 Low back pain, unspecified: Secondary | ICD-10-CM | POA: Diagnosis not present

## 2020-07-28 DIAGNOSIS — M47816 Spondylosis without myelopathy or radiculopathy, lumbar region: Secondary | ICD-10-CM | POA: Diagnosis not present

## 2020-07-31 DIAGNOSIS — M47816 Spondylosis without myelopathy or radiculopathy, lumbar region: Secondary | ICD-10-CM | POA: Diagnosis not present

## 2020-07-31 DIAGNOSIS — M545 Low back pain, unspecified: Secondary | ICD-10-CM | POA: Diagnosis not present

## 2020-08-04 ENCOUNTER — Other Ambulatory Visit: Payer: Self-pay | Admitting: Physician Assistant

## 2020-08-07 DIAGNOSIS — M47816 Spondylosis without myelopathy or radiculopathy, lumbar region: Secondary | ICD-10-CM | POA: Diagnosis not present

## 2020-08-07 DIAGNOSIS — M545 Low back pain, unspecified: Secondary | ICD-10-CM | POA: Diagnosis not present

## 2020-08-09 DIAGNOSIS — M47816 Spondylosis without myelopathy or radiculopathy, lumbar region: Secondary | ICD-10-CM | POA: Diagnosis not present

## 2020-08-09 DIAGNOSIS — M545 Low back pain, unspecified: Secondary | ICD-10-CM | POA: Diagnosis not present

## 2020-08-14 DIAGNOSIS — M545 Low back pain, unspecified: Secondary | ICD-10-CM | POA: Diagnosis not present

## 2020-08-14 DIAGNOSIS — M47816 Spondylosis without myelopathy or radiculopathy, lumbar region: Secondary | ICD-10-CM | POA: Diagnosis not present

## 2020-08-16 DIAGNOSIS — M47816 Spondylosis without myelopathy or radiculopathy, lumbar region: Secondary | ICD-10-CM | POA: Diagnosis not present

## 2020-08-16 DIAGNOSIS — M545 Low back pain, unspecified: Secondary | ICD-10-CM | POA: Diagnosis not present

## 2020-08-29 DIAGNOSIS — M545 Low back pain, unspecified: Secondary | ICD-10-CM | POA: Diagnosis not present

## 2020-08-29 DIAGNOSIS — M47816 Spondylosis without myelopathy or radiculopathy, lumbar region: Secondary | ICD-10-CM | POA: Diagnosis not present

## 2020-09-01 DIAGNOSIS — M545 Low back pain, unspecified: Secondary | ICD-10-CM | POA: Diagnosis not present

## 2020-09-01 DIAGNOSIS — M47816 Spondylosis without myelopathy or radiculopathy, lumbar region: Secondary | ICD-10-CM | POA: Diagnosis not present

## 2020-09-05 DIAGNOSIS — M47816 Spondylosis without myelopathy or radiculopathy, lumbar region: Secondary | ICD-10-CM | POA: Diagnosis not present

## 2020-09-05 DIAGNOSIS — M545 Low back pain, unspecified: Secondary | ICD-10-CM | POA: Diagnosis not present

## 2020-09-13 ENCOUNTER — Other Ambulatory Visit: Payer: Self-pay | Admitting: Physician Assistant

## 2020-09-13 DIAGNOSIS — F5102 Adjustment insomnia: Secondary | ICD-10-CM

## 2020-09-15 DIAGNOSIS — M47816 Spondylosis without myelopathy or radiculopathy, lumbar region: Secondary | ICD-10-CM | POA: Diagnosis not present

## 2020-09-15 DIAGNOSIS — M545 Low back pain, unspecified: Secondary | ICD-10-CM | POA: Diagnosis not present

## 2020-09-18 DIAGNOSIS — M545 Low back pain, unspecified: Secondary | ICD-10-CM | POA: Diagnosis not present

## 2020-09-18 DIAGNOSIS — M47816 Spondylosis without myelopathy or radiculopathy, lumbar region: Secondary | ICD-10-CM | POA: Diagnosis not present

## 2020-09-19 DIAGNOSIS — N3946 Mixed incontinence: Secondary | ICD-10-CM | POA: Diagnosis not present

## 2020-09-20 DIAGNOSIS — M47816 Spondylosis without myelopathy or radiculopathy, lumbar region: Secondary | ICD-10-CM | POA: Diagnosis not present

## 2020-09-20 DIAGNOSIS — M545 Low back pain, unspecified: Secondary | ICD-10-CM | POA: Diagnosis not present

## 2020-09-30 ENCOUNTER — Other Ambulatory Visit: Payer: Self-pay | Admitting: Physician Assistant

## 2020-09-30 DIAGNOSIS — E038 Other specified hypothyroidism: Secondary | ICD-10-CM

## 2020-10-05 ENCOUNTER — Other Ambulatory Visit: Payer: Self-pay | Admitting: Physician Assistant

## 2020-10-05 DIAGNOSIS — I10 Essential (primary) hypertension: Secondary | ICD-10-CM

## 2020-10-05 DIAGNOSIS — E782 Mixed hyperlipidemia: Secondary | ICD-10-CM

## 2020-10-20 DIAGNOSIS — M545 Low back pain, unspecified: Secondary | ICD-10-CM | POA: Diagnosis not present

## 2020-10-20 DIAGNOSIS — M47816 Spondylosis without myelopathy or radiculopathy, lumbar region: Secondary | ICD-10-CM | POA: Diagnosis not present

## 2020-10-24 DIAGNOSIS — M47816 Spondylosis without myelopathy or radiculopathy, lumbar region: Secondary | ICD-10-CM | POA: Diagnosis not present

## 2020-10-24 DIAGNOSIS — M545 Low back pain, unspecified: Secondary | ICD-10-CM | POA: Diagnosis not present

## 2020-10-26 ENCOUNTER — Other Ambulatory Visit: Payer: Self-pay | Admitting: Physician Assistant

## 2020-10-26 DIAGNOSIS — E781 Pure hyperglyceridemia: Secondary | ICD-10-CM

## 2020-10-26 DIAGNOSIS — E785 Hyperlipidemia, unspecified: Secondary | ICD-10-CM

## 2020-10-30 ENCOUNTER — Ambulatory Visit (INDEPENDENT_AMBULATORY_CARE_PROVIDER_SITE_OTHER): Payer: PPO

## 2020-10-30 ENCOUNTER — Ambulatory Visit: Payer: PPO | Admitting: Orthopedic Surgery

## 2020-10-30 ENCOUNTER — Other Ambulatory Visit: Payer: Self-pay

## 2020-10-30 DIAGNOSIS — M6702 Short Achilles tendon (acquired), left ankle: Secondary | ICD-10-CM | POA: Diagnosis not present

## 2020-10-30 DIAGNOSIS — M205X2 Other deformities of toe(s) (acquired), left foot: Secondary | ICD-10-CM | POA: Diagnosis not present

## 2020-10-30 DIAGNOSIS — M79672 Pain in left foot: Secondary | ICD-10-CM

## 2020-10-31 ENCOUNTER — Other Ambulatory Visit: Payer: Self-pay | Admitting: Physician Assistant

## 2020-10-31 ENCOUNTER — Encounter: Payer: Self-pay | Admitting: Orthopedic Surgery

## 2020-10-31 DIAGNOSIS — E782 Mixed hyperlipidemia: Secondary | ICD-10-CM

## 2020-10-31 NOTE — Progress Notes (Signed)
Office Visit Note   Patient: Shelly Hickman           Date of Birth: 1940/08/05           MRN: [id number] Visit Date: 10/30/2020              Requested by: Lorrene Reid, PA-C [address] [place],  [place] [id number] PCP: Lorrene Reid, PA-C  Chief Complaint  Patient presents with   Left Foot - Pain    Hx 2019 morton's neuroma excision and weil osteotomy 2nd and 3rd MT       HPI: Patient is a 80 year old woman who is seen for evaluation of painful clawing of the second and third toes she has pain with them rubbing in her shoe wear.  She is status post a Morton's neuroma excision as well as Weil osteotomies for the second and third metatarsals in 2019.  Assessment & Plan: Visit Diagnoses:  1. Pain in left foot   2. Claw toe, acquired, left     Plan: Patient states she would like to proceed with surgical intervention due to failure conservative treatment.  We will proceed with a PIP resection of the second and third toes and a gastrocnemius recession at [facility] day surgery.  Risks and benefits were discussed patient states she understands wished to proceed at this time.  Follow-Up Instructions: Return if symptoms worsen or fail to improve.   Ortho Exam  Patient is alert, oriented, no adenopathy, well-dressed, normal affect, normal respiratory effort. Examination patient has a palpable pulse she has fixed clawing of the second and third toes with painful calluses with previous ulcers over the PIP joint.  She has fat pad atrophy beneath the metatarsal heads.  She does have Achilles contracture with dorsiflexion 20 degrees short of neutral with her knee extended.  Patient is currently going to [facility] for her back.  Imaging: No results found. No images are attached to the encounter.  Labs: Lab Results  Component Value Date   HGBA1C 5.9 (H) 04/12/2020   HGBA1C 6.3 (H) 05/17/2019   HGBA1C 6.0 (H) 10/15/2018   REPTSTATUS 03/05/2016  FINAL 03/04/2016   GRAMSTAIN  12/11/2012    RARE WBC PRESENT, PREDOMINANTLY PMN NO SQUAMOUS EPITHELIAL CELLS SEEN NO ORGANISMS SEEN Performed at [facility], SUGGEST RECOLLECTION (A) 03/04/2016     Lab Results  Component Value Date   ALBUMIN 4.3 07/10/2020   ALBUMIN 4.0 04/12/2020   ALBUMIN 3.9 07/21/2019    No results found for: MG Lab Results  Component Value Date   VD25OH 41.6 05/17/2019   VD25OH 51.6 12/12/2017   VD25OH 34.2 10/04/2016    No results found for: PREALBUMIN CBC EXTENDED Latest Ref Rng & Units 07/10/2020 01/03/2020 07/21/2019  WBC 3.4 - 10.8 x10E3/uL 11.5(H) 15.0(H) 17.9(H)  RBC 3.77 - 5.28 x10E6/uL 4.60 4.67 4.90  HGB 11.1 - 15.9 g/dL 13.1 13.4 13.8  HCT 34.0 - 46.6 % 40.2 40.8 42.7  PLT 150 - 450 x10E3/uL 359 422 415  NEUTROABS 1.4 - 7.0 x10E3/uL 7.5(H) - 12.4(H)  LYMPHSABS 0.7 - 3.1 x10E3/uL 2.2 - 3.3(H)     There is no height or weight on file to calculate BMI.  Orders:  Orders Placed This Encounter  Procedures   XR Foot 2 Views Left   No orders of the defined types were placed in this encounter.    Procedures: No procedures performed  Clinical Data: No additional  findings.  ROS:  All other systems negative, except as noted in the HPI. Review of Systems  Objective: Vital Signs: There were no vitals taken for this visit.  Specialty Comments:  No specialty comments available.  PMFS History: Patient Active Problem List   Diagnosis Date Noted   acute on Chronic pansinusitis 01/28/2019   Sore throat 01/05/2019   Onychomycosis of toenail 10/15/2018   Hammer toe of left foot 10/15/2018   Spasm of back muscles-  left greater than right and lumbar region 10/15/2018   Bilateral low back pain without sciatica 10/15/2018   Neck enlargement- left lateral neck in supraclavicular region- unknkown cause 04/22/2018   Subclinical hypothyroidism-with symptoms of fatigue, hair loss etc. 12/17/2017    Burning sensation of lower extremity 12/17/2017   Bruit of left carotid artery 09/11/2017   Functional diarrhea 06/24/2017   Irritable bowel syndrome with both constipation and diarrhea 06/24/2017   Claw toe, acquired, left 05/29/2017   Thrush, oral 05/01/2017   Morton neuroma, left 01/20/2017   Pain in left foot 01/20/2017   Sialoadenitis- submandibular gland 12/10/2016   Pre-op evaluation 05/30/2016   Disorder of vagina- Malodor of Vagina 12/31/2015   Encounter for wellness examination 12/31/2015   Primary localized osteoarthritis of right knee 12/12/2015   Spinal stenosis in cervical region 10/18/2015   Spinal stenosis of lumbar region 10/18/2015   Morbid obesity ([place]) 09/09/2015   Prediabetes 09/06/2015   Low HDL (under 40) 09/06/2015   Hypertriglyceridemia 09/06/2015   HLD (hyperlipidemia) 08/30/2015   Memory impairment 08/30/2015   Vitamin D deficiency 08/30/2015   Incontinence of urine 09/05/2013   Status post left hip replacement 07/14/2013   Essential hypertension, benign 07/14/2013   Constipation 07/14/2013   Allergic rhinitis 07/14/2013   Anxiety 07/14/2013   Insomnia 07/14/2013   GERD (gastroesophageal reflux disease) 12/10/2012   Osteopenia 06/27/2011   Depression, recurrent ([place]) [id number]   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   Degenerative joint disease involving multiple joints 04/06/2010   Past Medical History:  Diagnosis Date   Anemia    Anxiety    Arthritis    Depression    H/O hiatal hernia    Hypercholesterolemia    Hypertension    Incontinence of urine    Neuromuscular disorder ([place])    Osteoarthritis     Family History  Problem Relation Age of Onset   Hypertension Mother    Diabetes Mother    Stroke Mother    Alcoholism Father    Alcohol abuse Father    Depression Sister     Past Surgical History:  Procedure Laterality Date   2 Synovial Cysts removed between L4-L5     ABDOMINAL HYSTERECTOMY     BACK SURGERY     back fusion  after 2 back surgeries    CARPAL TUNNEL RELEASE     COLONOSCOPY     EYE SURGERY     tube inserted for excessive tearing , tube is removed    Right Knee Arthroscopy     SALPINGOOPHORECTOMY  1993   TONSILLECTOMY     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   Procedure: TOTAL HIP ARTHROPLASTY;  Surgeon: Kerin Salen, MD;  Location: [facility];  Service: Orthopedics;  Laterality: Left;   TOTAL KNEE ARTHROPLASTY Right 03/04/2016   Procedure: TOTAL KNEE ARTHROPLASTY;  Surgeon: Elsie Saas, MD;  Location: [facility];  Service: Orthopedics;  Laterality: Right;   WISDOM TOOTH EXTRACTION     Social History   Occupational History   Not on  file  Tobacco Use   Smoking status: Former    Packs/day: 0.50    Types: Cigarettes    Start date: 02/19/1960    Quit date: 02/18/1965    Years since quitting: 55.7   Smokeless tobacco: Never  Vaping Use   Vaping Use: Never used  Substance and Sexual Activity   Alcohol use: No   Drug use: No   Sexual activity: Not Currently

## 2020-11-01 DIAGNOSIS — M47816 Spondylosis without myelopathy or radiculopathy, lumbar region: Secondary | ICD-10-CM | POA: Diagnosis not present

## 2020-11-01 DIAGNOSIS — M545 Low back pain, unspecified: Secondary | ICD-10-CM | POA: Diagnosis not present

## 2020-11-02 DIAGNOSIS — D485 Neoplasm of uncertain behavior of skin: Secondary | ICD-10-CM | POA: Diagnosis not present

## 2020-11-02 DIAGNOSIS — I788 Other diseases of capillaries: Secondary | ICD-10-CM | POA: Diagnosis not present

## 2020-11-02 DIAGNOSIS — L304 Erythema intertrigo: Secondary | ICD-10-CM | POA: Diagnosis not present

## 2020-11-02 DIAGNOSIS — D1801 Hemangioma of skin and subcutaneous tissue: Secondary | ICD-10-CM | POA: Diagnosis not present

## 2020-11-02 DIAGNOSIS — L821 Other seborrheic keratosis: Secondary | ICD-10-CM | POA: Diagnosis not present

## 2020-11-02 DIAGNOSIS — L905 Scar conditions and fibrosis of skin: Secondary | ICD-10-CM | POA: Diagnosis not present

## 2020-11-02 DIAGNOSIS — B078 Other viral warts: Secondary | ICD-10-CM | POA: Diagnosis not present

## 2020-11-02 DIAGNOSIS — Z85828 Personal history of other malignant neoplasm of skin: Secondary | ICD-10-CM | POA: Diagnosis not present

## 2020-11-02 DIAGNOSIS — L570 Actinic keratosis: Secondary | ICD-10-CM | POA: Diagnosis not present

## 2020-11-07 ENCOUNTER — Other Ambulatory Visit: Payer: Self-pay | Admitting: Physician Assistant

## 2020-11-07 DIAGNOSIS — I10 Essential (primary) hypertension: Secondary | ICD-10-CM

## 2020-11-13 ENCOUNTER — Ambulatory Visit (INDEPENDENT_AMBULATORY_CARE_PROVIDER_SITE_OTHER): Payer: PPO | Admitting: Physician Assistant

## 2020-11-13 ENCOUNTER — Other Ambulatory Visit: Payer: Self-pay

## 2020-11-13 ENCOUNTER — Encounter: Payer: Self-pay | Admitting: Physician Assistant

## 2020-11-13 VITALS — BP 118/74 | HR 84 | Temp 98.4°F | Ht 66.0 in | Wt 212.1 lb

## 2020-11-13 DIAGNOSIS — R7303 Prediabetes: Secondary | ICD-10-CM | POA: Diagnosis not present

## 2020-11-13 DIAGNOSIS — N760 Acute vaginitis: Secondary | ICD-10-CM | POA: Diagnosis not present

## 2020-11-13 DIAGNOSIS — E038 Other specified hypothyroidism: Secondary | ICD-10-CM | POA: Diagnosis not present

## 2020-11-13 DIAGNOSIS — Z23 Encounter for immunization: Secondary | ICD-10-CM | POA: Diagnosis not present

## 2020-11-13 DIAGNOSIS — E559 Vitamin D deficiency, unspecified: Secondary | ICD-10-CM | POA: Diagnosis not present

## 2020-11-13 DIAGNOSIS — E785 Hyperlipidemia, unspecified: Secondary | ICD-10-CM

## 2020-11-13 DIAGNOSIS — L304 Erythema intertrigo: Secondary | ICD-10-CM

## 2020-11-13 DIAGNOSIS — I951 Orthostatic hypotension: Secondary | ICD-10-CM

## 2020-11-13 DIAGNOSIS — I10 Essential (primary) hypertension: Secondary | ICD-10-CM | POA: Diagnosis not present

## 2020-11-13 LAB — POCT URINALYSIS DIPSTICK
Bilirubin, UA: NEGATIVE
Blood, UA: NEGATIVE
Glucose, UA: NEGATIVE
Ketones, UA: NEGATIVE
Leukocytes, UA: NEGATIVE
Nitrite, UA: NEGATIVE
Protein, UA: NEGATIVE
Spec Grav, UA: 1.02 (ref 1.010–1.025)
Urobilinogen, UA: 0.2 E.U./dL
pH, UA: 5.5 (ref 5.0–8.0)

## 2020-11-13 MED ORDER — FLUTICASONE PROPIONATE 0.05 % EX CREA: TOPICAL_CREAM | Freq: Every day | CUTANEOUS | 1 refills | Status: DC

## 2020-11-13 MED ORDER — KETOCONAZOLE 2 % EX CREA: 1.0000 "application " | TOPICAL_CREAM | Freq: Every day | CUTANEOUS | 1 refills | Status: DC

## 2020-11-13 MED ORDER — FLUCONAZOLE 150 MG PO TABS: 150.0000 mg | ORAL_TABLET | Freq: Once | ORAL | 0 refills | Status: AC

## 2020-11-13 NOTE — Progress Notes (Signed)
Established Patient Office Visit  Subjective:  Patient ID: Shelly Hickman, female    DOB: 1941-01-30  Age: 80 y.o. MRN: [id number]  CC:  Chief Complaint  Patient presents with   Follow-up    Mood, Weight    Hypertension    HPI Shelly Hickman presents for follow up on hypertension, mood and weight. Patient has c/o vaginal itching, malodor, and also skin irritation underneath breasts and lower abdomen. Denies vaginal discharge, fever or lower abdomen pain. Patient is followed by Urology for urinary incontinence. Patient also has c/o dizziness. States symptoms can be triggered with position changes. Episodes last briefly, 2-3 seconds. No syncope or presyncope.   HTN: Pt denies chest pain, palpitations, shortness of breath or lower extremity swelling. Taking medication as directed without side effects. Has not been chekcing BP at home recently. Pt follows a low salt diet.  Mood: Patient continues to have caregiver stress. Also reports added stress with her sister's health. Taking medication as directed without issues.  Weight: Patient reports has been working on weight loss. Has been more active.  Past Medical History:  Diagnosis Date   Anemia    Anxiety    Arthritis    Depression    H/O hiatal hernia    Hypercholesterolemia    Hypertension    Incontinence of urine    Neuromuscular disorder ([place])    Osteoarthritis     Past Surgical History:  Procedure Laterality Date   2 Synovial Cysts removed between L4-L5     ABDOMINAL HYSTERECTOMY     BACK SURGERY     back fusion after 2 back surgeries    CARPAL TUNNEL RELEASE     COLONOSCOPY     EYE SURGERY     tube inserted for excessive tearing , tube is removed    Right Knee Arthroscopy     SALPINGOOPHORECTOMY  1993   TONSILLECTOMY     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   Procedure: TOTAL HIP ARTHROPLASTY;  Surgeon: Kerin Salen, MD;  Location: [facility];  Service: Orthopedics;  Laterality: Left;   TOTAL KNEE ARTHROPLASTY  Right 03/04/2016   Procedure: TOTAL KNEE ARTHROPLASTY;  Surgeon: Elsie Saas, MD;  Location: [facility];  Service: Orthopedics;  Laterality: Right;   WISDOM TOOTH EXTRACTION      Family History  Problem Relation Age of Onset   Hypertension Mother    Diabetes Mother    Stroke Mother    Alcoholism Father    Alcohol abuse Father    Depression Sister     Social History   Socioeconomic History   Marital status: Married    Spouse name: Not on file   Number of children: Not on file   Years of education: Not on file   Highest education level: Not on file  Occupational History   Not on file  Tobacco Use   Smoking status: Former    Packs/day: 0.50    Types: Cigarettes    Start date: 02/19/1960    Quit date: 02/18/1965    Years since quitting: 55.7   Smokeless tobacco: Never  Vaping Use   Vaping Use: Never used  Substance and Sexual Activity   Alcohol use: No   Drug use: No   Sexual activity: Not Currently  Other Topics Concern   Not on file  Social History Narrative   Not on file   Social Determinants of Health   Financial Resource Strain: Not on file  Food Insecurity: Not on file  Transportation Needs: Not on file  Physical Activity: Not on file  Stress: Not on file  Social Connections: Not on file  Intimate Partner Violence: Not on file    Outpatient Medications Prior to Visit  Medication Sig Dispense Refill   acetaminophen (TYLENOL) 325 MG tablet Take 2 tablets (650 mg total) by mouth every 6 (six) hours as needed for mild pain (or Fever >/= 101).     amLODipine (NORVASC) 5 MG tablet TAKE 1 TABLET BY MOUTH DAILY 90 tablet 0   aspirin EC 81 MG tablet Take 81 mg by mouth daily.     Calcium Carb-Cholecalciferol (CALCIUM 600-D PO) Take 1 tablet by mouth daily.     Cholecalciferol (VITAMIN D-3) 5000 units TABS Take 5,000 Units by mouth daily.     diphenhydrAMINE (BENADRYL) 25 mg capsule Take 25 mg by mouth every 6 (six) hours as needed.     DULoxetine (CYMBALTA) 60 MG  capsule TAKE 2 CAPSULES BY MOUTH DAILY 180 capsule 0   fluticasone (FLONASE) 50 MCG/ACT nasal spray 1 spray each nostril following sinus rinses twice daily 16 g 5   gabapentin (NEURONTIN) 300 MG capsule TAKE 3 CAPSULES BY MOUTH 3 TIMES DAILY 810 capsule 0   levothyroxine (SYNTHROID) 25 MCG tablet TAKE 1 TABLET BY MOUTH DAILY 90 tablet 0   losartan (COZAAR) 100 MG tablet TAKE 1 TABLET BY MOUTH DAILY 90 tablet 0   niacin (NIASPAN) 1000 MG CR tablet TAKE 1 TABLET BY MOUTH EACH NIGHT AT BEDTIME 90 tablet 0   omega-3 acid ethyl esters (LOVAZA) 1 g capsule TAKE 2 CAPSULES BY MOUTH TWICE DAILY 360 capsule 0   oxybutynin (DITROPAN-XL) 10 MG 24 hr tablet Take 1 tablet (10 mg total) by mouth at bedtime. 90 tablet 1   simvastatin (ZOCOR) 20 MG tablet TAKE 1 TABLET BY MOUTH EACH NIGHT AT BEDTIME 90 tablet 0   traZODone (DESYREL) 100 MG tablet TAKE 1 TABLET BY MOUTH DAILY AT BEDTIME FOR SLEEP 90 tablet 0   UNABLE TO FIND FLUOROURACIL 5% + CALCIPOTRIENE 0.005%     fexofenadine (ALLEGRA) 180 MG tablet Take 1 tablet (180 mg total) by mouth daily. 90 tablet 1   fluticasone (CUTIVATE) 0.05 % cream Apply topically daily. 30 g 0   ketoconazole (NIZORAL) 2 % cream Apply 1 application topically daily. 15 g 0   methocarbamol (ROBAXIN) 500 MG tablet Take 1 tablet (500 mg total) by mouth every 8 (eight) hours as needed for muscle spasms. (Patient not taking: Reported on 07/10/2020) 60 tablet 0   nabumetone (RELAFEN) 500 MG tablet Take 500 mg by mouth 2 (two) times daily.     No facility-administered medications prior to visit.    Allergies  Allergen Reactions   Avelox [Moxifloxacin Hcl In Nacl] Other (See Comments)    CHEST PAIN OF UNSPECIFIED ORIGIN   Cephalosporins Hives and Itching   Bee Venom    Other Itching and Swelling    UNSPECIFIED INSECT STINGS    ROS Review of Systems Review of Systems:  A fourteen system review of systems was performed and found to be positive as per HPI.   Objective:     Physical Exam General:  Well Developed, well nourished, appropriate for stated age.  Neuro:  Alert and oriented,  extra-ocular muscles intact, no focal deficits, slight horizontal nystagmus noted with peripheral gaze b/l   HEENT:  Normocephalic, atraumatic, neck supple, no adenopathy  Skin:  erythematous rash at lower abdomen skin fold Cardiac:  RRR, S1 S2  Respiratory:  CTA B/L, Not using accessory muscles, speaking in full sentences- unlabored. GU: vaginal erythema with signs of vaginal atrophy and light white discharge Vascular:  Ext warm, no cyanosis apprec.; cap RF less 2 sec. Psych:  No HI/SI, judgement and insight good, Euthymic mood. Full Affect.  BP 118/74   Pulse 84   Temp 98.4 F (36.9 C)   Ht 5' 6" (1.676 m)   Wt 212 lb 1.6 oz (96.2 kg)   SpO2 95%   BMI 34.23 kg/m  Wt Readings from Last 3 Encounters:  11/13/20 212 lb 1.6 oz (96.2 kg)  07/10/20 221 lb 12.8 oz (100.6 kg)  05/09/20 222 lb (100.7 kg)     Health Maintenance Due  Topic Date Due   Zoster Vaccines- Shingrix (1 of 2) Never done   COVID-19 Vaccine (4 - Booster for Pfizer series) 04/04/2020    There are no preventive care reminders to display for this patient.  Lab Results  Component Value Date   TSH 3.220 11/13/2020   Lab Results  Component Value Date   WBC 8.8 11/13/2020   HGB 13.5 11/13/2020   HCT 41.8 11/13/2020   MCV 86 11/13/2020   PLT 414 11/13/2020   Lab Results  Component Value Date   NA 141 11/13/2020   K 4.4 11/13/2020   CO2 20 11/13/2020   GLUCOSE 105 (H) 11/13/2020   BUN 16 11/13/2020   CREATININE 0.85 11/13/2020   BILITOT 0.3 11/13/2020   ALKPHOS 86 11/13/2020   AST 18 11/13/2020   ALT 13 11/13/2020   PROT 6.8 11/13/2020   ALBUMIN 4.4 11/13/2020   CALCIUM 9.5 11/13/2020   ANIONGAP 7 03/07/2016   EGFR 70 11/13/2020   Lab Results  Component Value Date   CHOL 174 11/13/2020   Lab Results  Component Value Date   HDL 49 11/13/2020   Lab Results  Component Value Date    LDLCALC 103 (H) 11/13/2020   Lab Results  Component Value Date   TRIG 122 11/13/2020   Lab Results  Component Value Date   CHOLHDL 3.6 11/13/2020   Lab Results  Component Value Date   HGBA1C 6.1 (H) 11/13/2020      Assessment & Plan:   Problem List Items Addressed This Visit       Cardiovascular and Mediastinum   Essential hypertension, benign - Primary (Chronic)    -Controlled. -Continue current medication regimen. -Will continue to monitor.      Relevant Orders   CBC with Differential/Platelet (Completed)   Comprehensive metabolic panel (Completed)     Endocrine   Subclinical hypothyroidism-with symptoms of fatigue, hair loss etc. (Chronic)    -Last TSH wnl -Continue current medication regimen. -Rechecking thyroid labs today. Pending results will make medication adjustments if indicated.       Relevant Orders   CBC with Differential/Platelet (Completed)   TSH (Completed)     Other   HLD (hyperlipidemia) (Chronic)    -Last lipid panel, LDL 100. -Will repeat lipid panel and hepatic function. -Continue current medication regimen. -Encourage to continue weight loss efforts and follow a low fat diet.       Relevant Orders   CBC with Differential/Platelet (Completed)   Lipid panel (Completed)   Vitamin D deficiency (Chronic)    -Will repeat Vitamin D. Last Vitamin D 41.6. -On Vitamin D3 5000 units daily.      Relevant Orders   CBC with Differential/Platelet (Completed)   VITAMIN D 25 Hydroxy (Vit-D Deficiency, Fractures) (Completed)  Prediabetes (Chronic)    -Last A1c 6.3, will repeat A1c. -Recommend to continue weight loss efforts and follow a low carbohydrate and glucose diet.      Relevant Orders   CBC with Differential/Platelet (Completed)   Hemoglobin A1c (Completed)   Other Visit Diagnoses     Need for influenza vaccination       Relevant Orders   Flu Vaccine QUAD High Dose(Fluad) (Completed)   Acute vaginitis       Relevant Orders    NuSwab Vaginitis (VG)   POCT urinalysis dipstick (Completed)   Intertrigo       Relevant Medications   ketoconazole (NIZORAL) 2 % cream   fluticasone (CUTIVATE) 0.05 % cream   Orthostatic hypotension          Orthostatic hypotension: -Discussed with patient dizziness is likely related to orthostatic hypotension and BPPV. Symptoms last briefly and resolve after resting so recommend to continue with slow position changes and avoid getting up too quickly.   Intertrigo: -Continue topical corticosteroid and antifungal therapy. Provided refill. No signs of bacterial skin infection present. -Recommend to continue to apply powder to help with moisture in skin fold areas.  Vaginitis: -Will start empiric oral antifungal therapy and collect NuSwab for further evaluation. -Discussed to avoid scratching.   Meds ordered this encounter  Medications   fluconazole (DIFLUCAN) 150 MG tablet    Sig: Take 1 tablet (150 mg total) by mouth once for 1 dose.    Dispense:  1 tablet    Refill:  0   ketoconazole (NIZORAL) 2 % cream    Sig: Apply 1 application topically daily.    Dispense:  15 g    Refill:  1    Order Specific Question:   Supervising Provider    Answer:   Beatrice Lecher D [[id number]]   fluticasone (CUTIVATE) 0.05 % cream    Sig: Apply topically daily.    Dispense:  30 g    Refill:  1    Order Specific Question:   Supervising Provider    Answer:   Beatrice Lecher D [[id number]]    Follow-up: Return in about 4 months (around 03/15/2021) for [facility].    Lorrene Reid, PA-C

## 2020-11-13 NOTE — Assessment & Plan Note (Signed)
-  Controlled. Continue current medication regimen. Will continue to monitor. 

## 2020-11-13 NOTE — Assessment & Plan Note (Signed)
-  Will repeat Vitamin D. Last Vitamin D 41.6. -On Vitamin D3 5000 units daily.

## 2020-11-13 NOTE — Assessment & Plan Note (Signed)
-  Last A1c 6.3, will repeat A1c. -Recommend to continue weight loss efforts and follow a low carbohydrate and glucose diet.

## 2020-11-13 NOTE — Assessment & Plan Note (Signed)
>>  ASSESSMENT AND PLAN FOR HLD (HYPERLIPIDEMIA) WRITTEN ON 11/13/2020  6:19 PM BY ABONZA, MARITZA, PA-C  -Last lipid panel, LDL 100. -Will repeat lipid panel and hepatic function. -Continue current medication regimen. -Encourage to continue weight loss efforts and follow a low fat diet.

## 2020-11-13 NOTE — Patient Instructions (Signed)

## 2020-11-13 NOTE — Assessment & Plan Note (Signed)
-  Last TSH wnl -Continue current medication regimen. -Rechecking thyroid labs today. Pending results will make medication adjustments if indicated.  

## 2020-11-13 NOTE — Assessment & Plan Note (Signed)
>>  ASSESSMENT AND PLAN FOR SUBCLINICAL HYPOTHYROIDISM-WITH SYMPTOMS OF FATIGUE, HAIR LOSS ETC. WRITTEN ON 11/13/2020  6:17 PM BY ABONZA, MARITZA, PA-C  -Last TSH wnl -Continue current medication regimen. -Rechecking thyroid labs today. Pending results will make medication adjustments if indicated.

## 2020-11-13 NOTE — Assessment & Plan Note (Signed)
-  Last lipid panel, LDL 100. -Will repeat lipid panel and hepatic function. -Continue current medication regimen. -Encourage to continue weight loss efforts and follow a low fat diet.

## 2020-11-14 DIAGNOSIS — M545 Low back pain, unspecified: Secondary | ICD-10-CM | POA: Diagnosis not present

## 2020-11-14 DIAGNOSIS — M47816 Spondylosis without myelopathy or radiculopathy, lumbar region: Secondary | ICD-10-CM | POA: Diagnosis not present

## 2020-11-14 LAB — CBC WITH DIFFERENTIAL/PLATELET
Basophils Absolute: 0.1 10*3/uL (ref 0.0–0.2)
Basos: 1 %
EOS (ABSOLUTE): 0.7 10*3/uL — ABNORMAL HIGH (ref 0.0–0.4)
Eos: 8 %
Hematocrit: 41.8 % (ref 34.0–46.6)
Hemoglobin: 13.5 g/dL (ref 11.1–15.9)
Immature Grans (Abs): 0 10*3/uL (ref 0.0–0.1)
Immature Granulocytes: 0 %
Lymphocytes Absolute: 2.2 10*3/uL (ref 0.7–3.1)
Lymphs: 25 %
MCH: 27.8 pg (ref 26.6–33.0)
MCHC: 32.3 g/dL (ref 31.5–35.7)
MCV: 86 fL (ref 79–97)
Monocytes Absolute: 0.9 10*3/uL (ref 0.1–0.9)
Monocytes: 10 %
Neutrophils Absolute: 4.9 10*3/uL (ref 1.4–7.0)
Neutrophils: 56 %
Platelets: 414 10*3/uL (ref 150–450)
RBC: 4.86 x10E6/uL (ref 3.77–5.28)
RDW: 13.2 % (ref 11.7–15.4)
WBC: 8.8 10*3/uL (ref 3.4–10.8)

## 2020-11-14 LAB — COMPREHENSIVE METABOLIC PANEL
ALT: 13 IU/L (ref 0–32)
AST: 18 IU/L (ref 0–40)
Albumin/Globulin Ratio: 1.8 (ref 1.2–2.2)
Albumin: 4.4 g/dL (ref 3.7–4.7)
Alkaline Phosphatase: 86 IU/L (ref 44–121)
BUN/Creatinine Ratio: 19 (ref 12–28)
BUN: 16 mg/dL (ref 8–27)
Bilirubin Total: 0.3 mg/dL (ref 0.0–1.2)
CO2: 20 mmol/L (ref 20–29)
Calcium: 9.5 mg/dL (ref 8.7–10.3)
Chloride: 103 mmol/L (ref 96–106)
Creatinine, Ser: 0.85 mg/dL (ref 0.57–1.00)
Globulin, Total: 2.4 g/dL (ref 1.5–4.5)
Glucose: 105 mg/dL — ABNORMAL HIGH (ref 70–99)
Potassium: 4.4 mmol/L (ref 3.5–5.2)
Sodium: 141 mmol/L (ref 134–144)
Total Protein: 6.8 g/dL (ref 6.0–8.5)
eGFR: 70 mL/min/{1.73_m2} (ref >59)

## 2020-11-14 LAB — HEMOGLOBIN A1C
Est. average glucose Bld gHb Est-mCnc: 128 mg/dL
Hgb A1c MFr Bld: 6.1 % — ABNORMAL HIGH (ref 4.8–5.6)

## 2020-11-14 LAB — LIPID PANEL
Chol/HDL Ratio: 3.6 ratio (ref 0.0–4.4)
Cholesterol, Total: 174 mg/dL (ref 100–199)
HDL: 49 mg/dL (ref >39)
LDL Chol Calc (NIH): 103 mg/dL — ABNORMAL HIGH (ref 0–99)
Triglycerides: 122 mg/dL (ref 0–149)
VLDL Cholesterol Cal: 22 mg/dL (ref 5–40)

## 2020-11-14 LAB — VITAMIN D 25 HYDROXY (VIT D DEFICIENCY, FRACTURES): Vit D, 25-Hydroxy: 36.9 ng/mL (ref 30.0–100.0)

## 2020-11-14 LAB — TSH: TSH: 3.22 u[IU]/mL (ref 0.450–4.500)

## 2020-11-15 ENCOUNTER — Encounter (HOSPITAL_COMMUNITY): Payer: Self-pay | Admitting: Emergency Medicine

## 2020-11-15 ENCOUNTER — Other Ambulatory Visit: Payer: Self-pay

## 2020-11-15 ENCOUNTER — Emergency Department (HOSPITAL_COMMUNITY): Payer: PPO

## 2020-11-15 ENCOUNTER — Emergency Department (HOSPITAL_COMMUNITY)
Admission: EM | Admit: 2020-11-15 | Discharge: 2020-11-15 | Disposition: A | Discharge status: Home / Self Care | Payer: PPO | Attending: Emergency Medicine | Admitting: Emergency Medicine

## 2020-11-15 ENCOUNTER — Telehealth: Payer: Self-pay | Admitting: Physician Assistant

## 2020-11-15 DIAGNOSIS — I10 Essential (primary) hypertension: Secondary | ICD-10-CM | POA: Diagnosis not present

## 2020-11-15 DIAGNOSIS — S8002XA Contusion of left knee, initial encounter: Secondary | ICD-10-CM | POA: Insufficient documentation

## 2020-11-15 DIAGNOSIS — I2510 Atherosclerotic heart disease of native coronary artery without angina pectoris: Secondary | ICD-10-CM | POA: Diagnosis not present

## 2020-11-15 DIAGNOSIS — W01198A Fall on same level from slipping, tripping and stumbling with subsequent striking against other object, initial encounter: Secondary | ICD-10-CM | POA: Insufficient documentation

## 2020-11-15 DIAGNOSIS — S3991XA Unspecified injury of abdomen, initial encounter: Secondary | ICD-10-CM | POA: Insufficient documentation

## 2020-11-15 DIAGNOSIS — Z96651 Presence of right artificial knee joint: Secondary | ICD-10-CM | POA: Insufficient documentation

## 2020-11-15 DIAGNOSIS — I517 Cardiomegaly: Secondary | ICD-10-CM | POA: Diagnosis not present

## 2020-11-15 DIAGNOSIS — S0990XA Unspecified injury of head, initial encounter: Secondary | ICD-10-CM | POA: Diagnosis not present

## 2020-11-15 DIAGNOSIS — S2002XA Contusion of left breast, initial encounter: Secondary | ICD-10-CM | POA: Diagnosis not present

## 2020-11-15 DIAGNOSIS — Z7982 Long term (current) use of aspirin: Secondary | ICD-10-CM | POA: Insufficient documentation

## 2020-11-15 DIAGNOSIS — S20212A Contusion of left front wall of thorax, initial encounter: Secondary | ICD-10-CM | POA: Diagnosis not present

## 2020-11-15 DIAGNOSIS — Z87891 Personal history of nicotine dependence: Secondary | ICD-10-CM | POA: Insufficient documentation

## 2020-11-15 DIAGNOSIS — S20211A Contusion of right front wall of thorax, initial encounter: Secondary | ICD-10-CM | POA: Insufficient documentation

## 2020-11-15 DIAGNOSIS — W19XXXA Unspecified fall, initial encounter: Secondary | ICD-10-CM

## 2020-11-15 DIAGNOSIS — Z96642 Presence of left artificial hip joint: Secondary | ICD-10-CM | POA: Diagnosis not present

## 2020-11-15 DIAGNOSIS — S0993XA Unspecified injury of face, initial encounter: Secondary | ICD-10-CM | POA: Diagnosis not present

## 2020-11-15 DIAGNOSIS — Y92011 Dining room of single-family (private) house as the place of occurrence of the external cause: Secondary | ICD-10-CM | POA: Diagnosis not present

## 2020-11-15 DIAGNOSIS — Z79899 Other long term (current) drug therapy: Secondary | ICD-10-CM | POA: Insufficient documentation

## 2020-11-15 DIAGNOSIS — S0031XA Abrasion of nose, initial encounter: Secondary | ICD-10-CM | POA: Diagnosis not present

## 2020-11-15 DIAGNOSIS — S299XXA Unspecified injury of thorax, initial encounter: Secondary | ICD-10-CM

## 2020-11-15 DIAGNOSIS — R296 Repeated falls: Secondary | ICD-10-CM | POA: Diagnosis not present

## 2020-11-15 DIAGNOSIS — Y9301 Activity, walking, marching and hiking: Secondary | ICD-10-CM | POA: Diagnosis not present

## 2020-11-15 DIAGNOSIS — M47816 Spondylosis without myelopathy or radiculopathy, lumbar region: Secondary | ICD-10-CM | POA: Diagnosis not present

## 2020-11-15 DIAGNOSIS — N6489 Other specified disorders of breast: Secondary | ICD-10-CM

## 2020-11-15 DIAGNOSIS — S2001XA Contusion of right breast, initial encounter: Secondary | ICD-10-CM | POA: Diagnosis not present

## 2020-11-15 DIAGNOSIS — S2020XA Contusion of thorax, unspecified, initial encounter: Secondary | ICD-10-CM | POA: Diagnosis not present

## 2020-11-15 DIAGNOSIS — M25561 Pain in right knee: Secondary | ICD-10-CM | POA: Diagnosis not present

## 2020-11-15 DIAGNOSIS — M25562 Pain in left knee: Secondary | ICD-10-CM | POA: Diagnosis not present

## 2020-11-15 DIAGNOSIS — K5730 Diverticulosis of large intestine without perforation or abscess without bleeding: Secondary | ICD-10-CM | POA: Diagnosis not present

## 2020-11-15 LAB — CBC
HCT: 35.9 % — ABNORMAL LOW (ref 36.0–46.0)
Hemoglobin: 11.4 g/dL — ABNORMAL LOW (ref 12.0–15.0)
MCH: 28.2 pg (ref 26.0–34.0)
MCHC: 31.8 g/dL (ref 30.0–36.0)
MCV: 88.9 fL (ref 80.0–100.0)
Platelets: 334 10*3/uL (ref 150–400)
RBC: 4.04 MIL/uL (ref 3.87–5.11)
RDW: 14.8 % (ref 11.5–15.5)
WBC: 8.9 10*3/uL (ref 4.0–10.5)
nRBC: 0 % (ref 0.0–0.2)

## 2020-11-15 LAB — BASIC METABOLIC PANEL
Anion gap: 7 (ref 5–15)
BUN: 19 mg/dL (ref 8–23)
CO2: 23 mmol/L (ref 22–32)
Calcium: 9 mg/dL (ref 8.9–10.3)
Chloride: 105 mmol/L (ref 98–111)
Creatinine, Ser: 0.91 mg/dL (ref 0.44–1.00)
GFR, Estimated: >60 mL/min (ref >60)
Glucose, Bld: 113 mg/dL — ABNORMAL HIGH (ref 70–99)
Potassium: 3.8 mmol/L (ref 3.5–5.1)
Sodium: 135 mmol/L (ref 135–145)

## 2020-11-15 LAB — NUSWAB VAGINITIS (VG)
Candida albicans, NAA: NEGATIVE
Candida glabrata, NAA: NEGATIVE
Trich vag by NAA: NEGATIVE

## 2020-11-15 MED ORDER — ACETAMINOPHEN 325 MG PO TABS
650.0000 mg | ORAL_TABLET | Freq: Once | ORAL | Status: AC
  Administered 2020-11-15: 650 mg via ORAL
  Filled 2020-11-15: qty 2

## 2020-11-15 MED ORDER — IOHEXOL 300 MG/ML  SOLN
100.0000 mL | Freq: Once | INTRAMUSCULAR | Status: AC | PRN
  Administered 2020-11-15: 100 mL via INTRAVENOUS

## 2020-11-15 MED ORDER — MORPHINE SULFATE (PF) 2 MG/ML IV SOLN
2.0000 mg | Freq: Once | INTRAVENOUS | Status: DC
  Filled 2020-11-15: qty 1

## 2020-11-15 MED ORDER — ONDANSETRON HCL 4 MG/2ML IJ SOLN
4.0000 mg | Freq: Once | INTRAMUSCULAR | Status: DC
  Filled 2020-11-15: qty 2

## 2020-11-15 NOTE — Telephone Encounter (Signed)
Patient called office stating she fell last night and hit her chest (right breast) on the arm of a chair. She states that this morning she is in severe pain and that her breast is swollen to 3 times its size. Patient is requesting an appointment to be seen.   I advised patient to go to the ED for evaluation and treatment and imaging. Patient verbalized understanding and was agreeable. AS, CMA

## 2020-11-15 NOTE — ED Triage Notes (Addendum)
Patient here for evaluation of right breast swelling after falling and hitting her right breast on the arm of a chair yesterday. Patient also reports hitting her face on the chair which caused her eyeglasses to lacerate her nose and cheek, but primary concern is swelling right breast. Patient reports minimal pain, denies increase of pain with inspiration. Denies loss of consciousness. Is not on an anticoagulant.

## 2020-11-15 NOTE — ED Provider Notes (Signed)
[facility] EMERGENCY DEPARTMENT Provider Note   CSN: [id number] Arrival date & time: 11/15/20  2094     History Chief Complaint  Patient presents with   Shelly Hickman is a 80 y.o. female.  80 year old female presents today with her husband for evaluation of chest wall pain following a fall yesterday afternoon localized to her right breast.  She reports yesterday she was assisting someone with changing the filters in her HVAC when she walked from her living room to the dining room she fell hitting the padded armrest of the chair on the way to the dining room.  She does not recall having a prodrome such as palpitations, lightheadedness, changes in her vision prior to the fall.  She also does not recall a mechanical mechanism.  She did not lose consciousness.  She remembers the act of falling.  She reports she fell onto her knees and outstretched arms.  She turned over to her back and sat up for a while prior to sitting up to a chair.  Her husband was out of the house at the time but her husband's caregiver was at the house and assisted her following the fall.  She was wearing glasses at the time which did not break but did cause minor abrasion to her nose.  She also reports knee pain mostly to her left knee.  She has mild bruising on the left knee as well.  She has a history of right knee replacement.  She has been ambulating without difficulty since the fall.  Her primary concern for coming into the emergency department today is her chest wall pain and the associated bruising and swelling.  She reports she has used ice a few times since the incident.  She has taken Tylenol x2 but nothing today.  She denies pain at rest, will report significant pain with movement of her upper extremities or palpation of her chest.  She is not on any anticoagulation.  Denies previous cardiac history aside from hypertension.  She does report similar history of falls in the past year.   Similar to this episode she does not recall the reason for her falls, but denies loss of consciousness, palpitations, lightheadedness with those episodes as well.  She reports a history of vertigo most recently a couple days ago prior to her visit with her PCP.  Previously seen by ENT with resolution of her episodes following Epley maneuvers.  Since the fall she denies a headache, changes in vision, nausea, shortness of breath.  Of note she has history of low back pain.  She has been undergoing physical therapy twice a week for several months.  Of note she does have a history of orthostasis, reports this has been asymptomatic.  She denies lightheadedness with positional change.  The history is provided by the patient. No language interpreter was used.  Fall Associated symptoms include chest pain (chest wall pain. reproducible.). Pertinent negatives include no abdominal pain and no shortness of breath. The symptoms are aggravated by twisting and exertion. Nothing relieves the symptoms. She has tried acetaminophen for the symptoms. The treatment provided mild relief.      Past Medical History:  Diagnosis Date   Anemia    Anxiety    Arthritis    Depression    H/O hiatal hernia    Hypercholesterolemia    Hypertension    Incontinence of urine    Neuromuscular disorder ([place])    Osteoarthritis  Patient Active Problem List   Diagnosis Date Noted   acute on Chronic pansinusitis 01/28/2019   Sore throat 01/05/2019   Onychomycosis of toenail 10/15/2018   Hammer toe of left foot 10/15/2018   Spasm of back muscles-  left greater than right and lumbar region 10/15/2018   Bilateral low back pain without sciatica 10/15/2018   Neck enlargement- left lateral neck in supraclavicular region- unknkown cause 04/22/2018   Subclinical hypothyroidism-with symptoms of fatigue, hair loss etc. 12/17/2017   Burning sensation of lower extremity 12/17/2017   Bruit of left carotid artery 09/11/2017   Functional  diarrhea 06/24/2017   Irritable bowel syndrome with both constipation and diarrhea 06/24/2017   Claw toe, acquired, left 05/29/2017   Thrush, oral 05/01/2017   [place] neuroma, left 01/20/2017   Pain in left foot 01/20/2017   Sialoadenitis- submandibular gland 12/10/2016   Pre-op evaluation 05/30/2016   Disorder of vagina- Malodor of Vagina 12/31/2015   Encounter for wellness examination 12/31/2015   Primary localized osteoarthritis of right knee 12/12/2015   Spinal stenosis in cervical region 10/18/2015   Spinal stenosis of lumbar region 10/18/2015   Morbid obesity ([place]) 09/09/2015   Prediabetes 09/06/2015   Low HDL (under 40) 09/06/2015   Hypertriglyceridemia 09/06/2015   HLD (hyperlipidemia) 08/30/2015   Memory impairment 08/30/2015   Vitamin D deficiency 08/30/2015   Incontinence of urine 09/05/2013   Status post left hip replacement 07/14/2013   Essential hypertension, benign 07/14/2013   Constipation 07/14/2013   Allergic rhinitis 07/14/2013   Anxiety 07/14/2013   Insomnia 07/14/2013   GERD (gastroesophageal reflux disease) 12/10/2012   Osteopenia 06/27/2011   Depression, recurrent ([place]) 38/25/0539   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/73/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   Degenerative joint disease involving multiple joints 04/06/2010    Past Surgical History:  Procedure Laterality Date   2 Synovial Cysts removed between L4-L5     ABDOMINAL HYSTERECTOMY     BACK SURGERY     back fusion after 2 back surgeries    CARPAL TUNNEL RELEASE     COLONOSCOPY     EYE SURGERY     tube inserted for excessive tearing , tube is removed    Right Knee Arthroscopy     SALPINGOOPHORECTOMY  1993   TONSILLECTOMY     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   Procedure: TOTAL HIP ARTHROPLASTY;  Surgeon: Kerin Salen, MD;  Location: [place];  Service: Orthopedics;  Laterality: Left;   TOTAL KNEE ARTHROPLASTY Right 03/04/2016   Procedure: TOTAL KNEE ARTHROPLASTY;  Surgeon: Elsie Saas, MD;  Location: [place];  Service:  Orthopedics;  Laterality: Right;   WISDOM TOOTH EXTRACTION       OB History   No obstetric history on file.     Family History  Problem Relation Age of Onset   Hypertension Mother    Diabetes Mother    Stroke Mother    Alcoholism Father    Alcohol abuse Father    Depression Sister     Social History   Tobacco Use   Smoking status: Former    Packs/day: 0.50    Types: Cigarettes    Start date: 02/19/1960    Quit date: 02/18/1965    Years since quitting: 55.7   Smokeless tobacco: Never  Vaping Use   Vaping Use: Never used  Substance Use Topics   Alcohol use: No   Drug use: No    Home Medications Prior to Admission medications   Medication Sig Start Date End Date Taking? Authorizing Provider  acetaminophen (TYLENOL) 325 MG tablet Take 2 tablets (650 mg total) by mouth every 6 (six) hours as needed for mild pain (or Fever >/= 101). 03/07/16   Shepperson, Kirstin, PA-C  amLODipine (NORVASC) 5 MG tablet TAKE 1 TABLET BY MOUTH DAILY 11/07/20   Lorrene Reid, PA-C  aspirin EC 81 MG tablet Take 81 mg by mouth daily.    [provider]  Calcium Carb-Cholecalciferol (CALCIUM 600-D PO) Take 1 tablet by mouth daily.    [provider]  Cholecalciferol (VITAMIN D-3) 5000 units TABS Take 5,000 Units by mouth daily.    [provider]  diphenhydrAMINE (BENADRYL) 25 mg capsule Take 25 mg by mouth every 6 (six) hours as needed.    [provider]  DULoxetine (CYMBALTA) 60 MG capsule TAKE 2 CAPSULES BY MOUTH DAILY 10/05/20   Abonza, Maritza, PA-C  fluticasone (CUTIVATE) 0.05 % cream Apply topically daily. 11/13/20   Lorrene Reid, PA-C  fluticasone (FLONASE) 50 MCG/ACT nasal spray 1 spray each nostril following sinus rinses twice daily 01/28/19   Opalski, Deborah, DO  gabapentin (NEURONTIN) 300 MG capsule TAKE 3 CAPSULES BY MOUTH 3 TIMES DAILY 06/10/19   Opalski, Neoma Laming, DO  ketoconazole (NIZORAL) 2 % cream Apply 1 application topically daily. 11/13/20    Lorrene Reid, PA-C  levothyroxine (SYNTHROID) 25 MCG tablet TAKE 1 TABLET BY MOUTH DAILY 10/02/20   Lorrene Reid, PA-C  losartan (COZAAR) 100 MG tablet TAKE 1 TABLET BY MOUTH DAILY 11/07/20   Lorrene Reid, PA-C  niacin (NIASPAN) 1000 MG CR tablet TAKE 1 TABLET BY MOUTH EACH NIGHT AT BEDTIME 10/26/20   Abonza, Maritza, PA-C  omega-3 acid ethyl esters (LOVAZA) 1 g capsule TAKE 2 CAPSULES BY MOUTH TWICE DAILY 01/19/20   Abonza, Maritza, PA-C  oxybutynin (DITROPAN-XL) 10 MG 24 hr tablet Take 1 tablet (10 mg total) by mouth at bedtime. 10/08/16   Opalski, Neoma Laming, DO  simvastatin (ZOCOR) 20 MG tablet TAKE 1 TABLET BY MOUTH EACH NIGHT AT BEDTIME 10/05/20   Abonza, Maritza, PA-C  traZODone (DESYREL) 100 MG tablet TAKE 1 TABLET BY MOUTH DAILY AT BEDTIME FOR SLEEP 09/13/20   Abonza, Maritza, PA-C  UNABLE TO FIND FLUOROURACIL 5% + CALCIPOTRIENE 0.005% 11/06/20   [provider]    Allergies    Avelox [moxifloxacin hcl in nacl], Cephalosporins, Bee venom, and Other  Review of Systems   Review of Systems  Constitutional:  Negative for activity change, appetite change and fatigue.  Respiratory:  Negative for chest tightness and shortness of breath.   Cardiovascular:  Positive for chest pain (chest wall pain. reproducible.). Negative for palpitations and leg swelling.  Gastrointestinal:  Negative for abdominal pain, nausea and vomiting.  Genitourinary:  Negative for difficulty urinating.  Musculoskeletal:  Positive for arthralgias (knee pain) and joint swelling (left knee). Negative for back pain and gait problem.  Skin:  Positive for color change (bruising to chest wall.).  Neurological:  Positive for dizziness (occassionally. not during this episode.). Negative for seizures, speech difficulty, weakness and light-headedness.  Hematological:  Does not bruise/bleed easily.  Psychiatric/Behavioral:  Negative for agitation, behavioral problems and confusion.   All other systems reviewed and are  negative.  Physical Exam Updated Vital Signs BP (!) 140/108 (BP Location: Right Arm)   Pulse 90   Temp 98.4 F (36.9 C) (Oral)   Resp 15   SpO2 98%   Physical Exam Vitals and nursing note reviewed. Exam conducted with a chaperone present.  Constitutional:      General: She  is not in acute distress.    Appearance: Normal appearance. She is not ill-appearing.  HENT:     Head: Normocephalic. No raccoon eyes, Battle's sign, abrasion or contusion.     Nose: Signs of injury (minor abrasion to nasal bridge) present. No nasal tenderness.     Right Nostril: No epistaxis or septal hematoma.     Left Nostril: No epistaxis or septal hematoma.     Mouth/Throat:     Lips: Pink.     Mouth: Mucous membranes are moist.  Eyes:     General: Lids are normal. Vision grossly intact. No visual field deficit.    Extraocular Movements: Extraocular movements intact.     Right eye: Normal extraocular motion.     Left eye: Normal extraocular motion.     Conjunctiva/sclera: Conjunctivae normal.     Pupils: Pupils are equal, round, and reactive to light.  Neck:     Trachea: No tracheal deviation.  Cardiovascular:     Rate and Rhythm: Normal rate and regular rhythm.     Pulses:          Dorsalis pedis pulses are 1+ on the right side and 1+ on the left side.  Pulmonary:     Effort: Pulmonary effort is normal. No respiratory distress.     Breath sounds: Normal breath sounds. No wheezing.  Chest:     Chest wall: Tenderness present. No lacerations.  Breasts:    Right: Tenderness present.     Left: No tenderness.       Comments: Significant bruising from sternum to lateral right breast border.  Exquisitely tender chest wall. Abdominal:     Palpations: Abdomen is soft.     Tenderness: There is abdominal tenderness in the left lower quadrant.  Musculoskeletal:     Cervical back: Full passive range of motion without pain, normal range of motion and neck supple. No pain with movement or spinous process  tenderness. Normal range of motion.     Comments: Mild swelling to left knee with minimal bruising.  Right knee visually unremarkable.  Right knee with previous surgical scar from total knee replacement.  Full active range of motion in bilateral knees.  Bilateral knees tender to palpation.  Ankles without visual deformity or swelling.  Full active and passive range of motion in bilateral ankles.  5/5 strength in bilateral lower extremities.  Sensation intact.  Bilateral shoulders with full range of motion, and without TTP.  Neurological:     Mental Status: She is alert and oriented to person, place, and time. Mental status is at baseline.     GCS: GCS eye subscore is 4. GCS verbal subscore is 5. GCS motor subscore is 6.     Cranial Nerves: Cranial nerves are intact.    ED Results / Procedures / Treatments   Labs (all labs ordered are listed, but only abnormal results are displayed) Labs Reviewed  CBC  BASIC METABOLIC PANEL    EKG None  Radiology No results found.  Procedures Procedures   Medications Ordered in ED Medications  morphine 2 MG/ML injection 2 mg (has no administration in time range)  ondansetron (ZOFRAN) injection 4 mg (has no administration in time range)    ED Course  I have reviewed the triage vital signs and the nursing notes.  Pertinent labs & imaging results that were available during my care of the patient were reviewed by me and considered in my medical decision making (see chart for details).  Clinical Course as  of 11/15/20 1754  Wed Nov 15, 2020  1501 Hemoglobin(!): 11.4 [AA]    Clinical Course User Index [AA] Evlyn Courier, PA-C   MDM Rules/Calculators/A&P                           80 year old female presents to the emergency room for evaluation of chest wall pain following a fall. Do not feel she had a syncopal episode or seizure.  She denied loss of consciousness, jerking motions or postictal episode after.  She has recollection of the fall and  called for help.  She denies prodrome prior to the fall.  Low suspicion for arrhythmia causing her fall due to normal sinus rhythm on telemetry and no report of palpitations.  Will obtain CBC, BMP, CT head without contrast. CT chest, abdomen, pelvis with contrast to evaluate for bleed..  Chest x-ray to rule out pneumothorax.  Bilateral knee x-rays.  CT head without acute intracranial processes, bilateral knee x-rays without acute fracture or other processes.  CT chest, abdomen, and pelvis with contrast significant only for hematoma in right breast without active extravasation.  Patient has had a hemoglobin drop of 2.1 g since 9/26.  Patient outside of chest wall pain, and knee pain is otherwise without complaints and has stable vital signs.  Patient is appropriate for discharge.  Discussed with patient the importance of follow-up with PCP for evaluation and to have repeat hemoglobin on Monday.  Home PT and OT evaluations have also been ordered on discharge.  Final Clinical Impression(s) / ED Diagnoses Final diagnoses:  Chest trauma    Rx / DC Orders ED Discharge Orders     None        Evlyn Courier, PA-C 11/15/20 1758    Tegeler, Gwenyth Allegra, MD 11/17/20 [phone]

## 2020-11-15 NOTE — Discharge Instructions (Addendum)
As discussed your CT scan showed you have a hematoma in the right breast without evidence of active bleeding.  Apply cold compress as discussed over the right breast.  Take Tylenol for pain control.  Follow-up with your primary care doctor on Monday for repeat hemoglobin and evaluation.

## 2020-11-20 ENCOUNTER — Encounter: Payer: Self-pay | Admitting: Physician Assistant

## 2020-11-20 ENCOUNTER — Other Ambulatory Visit: Payer: Self-pay

## 2020-11-20 ENCOUNTER — Ambulatory Visit (INDEPENDENT_AMBULATORY_CARE_PROVIDER_SITE_OTHER): Payer: PPO | Admitting: Physician Assistant

## 2020-11-20 VITALS — BP 114/72 | HR 84 | Temp 98.1°F | Ht 66.0 in | Wt 212.0 lb

## 2020-11-20 DIAGNOSIS — N6489 Other specified disorders of breast: Secondary | ICD-10-CM

## 2020-11-20 DIAGNOSIS — S299XXA Unspecified injury of thorax, initial encounter: Secondary | ICD-10-CM

## 2020-11-20 DIAGNOSIS — W19XXXA Unspecified fall, initial encounter: Secondary | ICD-10-CM | POA: Diagnosis not present

## 2020-11-20 NOTE — Patient Instructions (Signed)
Hematoma A hematoma is a collection of blood. A hematoma can happen: Under the skin. In an organ. In a body space. In a joint space. In other tissues. The blood can thicken (clot) to form a lump that you can see and feel. The lump is often hard and may become sore and tender. The lump can be very small or very big. Most hematomas get better in a few days to weeks. However, some hematomas may be serious andneed medical care. What are the causes? This condition is caused by: An injury. Blood that leaks under the skin. Problems from surgeries. Medical conditions that cause bleeding or bruising. What increases the risk? You are more likely to develop this condition if: You are an older adult. You use medicines that thin your blood. What are the signs or symptoms? Symptoms depend on where the hematoma is in your body. If the hematoma is under the skin, there is: A firm lump on the body. Pain and tenderness in the area. Bruising. The skin above the lump may be blue, dark blue, purple-red, or yellowish. If the hematoma is deep in the tissues or body spaces, there may be: Blood in the stomach. This may cause pain in the belly (abdomen), weakness, passing out (fainting), and shortness of breath. Blood in the head. This may cause a headache, weakness, trouble speaking or understanding speech, or passing out. How is this diagnosed? This condition is diagnosed based on: Your medical history. A physical exam. Imaging tests, such as ultrasound or CT scan. Blood tests. How is this treated? Treatment depends on the cause, size, and location of the hematoma. Treatment may include: Doing nothing. Many hematomas go away on their own without treatment. Surgery or close monitoring. This may be needed for large hematomas or hematomas that affect the body's organs. Medicines. These may be given if a medical condition caused the hematoma. Follow these instructions at home: Managing pain, stiffness,  and swelling  If told, put ice on the area. Put ice in a plastic bag. Place a towel between your skin and the bag. Leave the ice on for 20 minutes, 2-3 times a day for the first two days. If told, put heat on the affected area after putting ice on the area for two days. Use the heat source that your doctor tells you to use. This could be a moist heat pack or a heating pad. To do this: Place a towel between your skin and the heat source. Leave the heat on for 20-30 minutes. Remove the heat if your skin turns bright red. This is very important if you are unable to feel pain, heat, or cold. You may have a greater risk of getting burned. Raise (elevate) the affected area above the level of your heart while you are sitting or lying down. Wrap the affected area with an elastic bandage, if told by your doctor. Do not wrap the bandage too tightly. If your hematoma is on a leg or foot and is painful, your doctor may give you crutches. Use them as told by your doctor.  General instructions Take over-the-counter and prescription medicines only as told by your doctor. Keep all follow-up visits as told by your doctor. This is important. Contact a doctor if: You have a fever. The swelling or bruising gets worse. You start to get more hematomas. Get help right away if: Your pain gets worse. Your pain is not getting better with medicine. Your skin over the hematoma breaks or starts to bleed.   Your hematoma is in your chest or belly and you: Pass out. Feel weak. Become short of breath. You have a hematoma on your scalp that is caused by a fall or injury, and you: Have a headache that gets worse. Have trouble speaking or understanding speech. Become less alert or you pass out. Summary A hematoma is a collection of blood in any part of your body. Most hematomas get better on their own in a few days to weeks. Some may need medical care. Follow instructions from your doctor about how to care for your  hematoma. Contact a doctor if the swelling or bruising gets worse, or if you are short of breath. This information is not intended to replace advice given to you by your health care provider. Make sure you discuss any questions you have with your healthcare provider. Document Revised: 07/10/2017 Document Reviewed: 07/10/2017 Elsevier Patient Education  2022 [employer]  

## 2020-11-20 NOTE — Progress Notes (Signed)
Established Patient Office Visit  Subjective:  Patient ID: Shelly Hickman, female    DOB: 11-24-1940  Age: 80 y.o. MRN: [id number]  CC:  Chief Complaint  Patient presents with   Hospitalization Follow-up    HPI Shelly Hickman presents for ED follow up. Patient to the ED 11/15/2020 for evaluation of fall and chest was pain. States unsure how she fell, but thinks she tripped and the fell over armrest of chair. Has been applying ice therapy to hematoma and taking Tylenol 500 mg as needed for pain relief. 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 No shortness of breath or palpitations. States having home health PT and OT.   Past Medical History:  Diagnosis Date   Anemia    Anxiety    Arthritis    Depression    H/O hiatal hernia    Hypercholesterolemia    Hypertension    Incontinence of urine    Neuromuscular disorder ([place])    Osteoarthritis     Past Surgical History:  Procedure Laterality Date   2 Synovial Cysts removed between L4-L5     ABDOMINAL HYSTERECTOMY     BACK SURGERY     back fusion after 2 back surgeries    CARPAL TUNNEL RELEASE     COLONOSCOPY     EYE SURGERY     tube inserted for excessive tearing , tube is removed    Right Knee Arthroscopy     SALPINGOOPHORECTOMY  1993   TONSILLECTOMY     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   Procedure: TOTAL HIP ARTHROPLASTY;  Surgeon: Kerin Salen, MD;  Location: [facility];  Service: Orthopedics;  Laterality: Left;   TOTAL KNEE ARTHROPLASTY Right 03/04/2016   Procedure: TOTAL KNEE ARTHROPLASTY;  Surgeon: Elsie Saas, MD;  Location: [facility];  Service: Orthopedics;  Laterality: Right;   WISDOM TOOTH EXTRACTION      Family History  Problem Relation Age of Onset   Hypertension Mother    Diabetes Mother    Stroke Mother    Alcoholism Father    Alcohol abuse Father    Depression Sister     Social History   Socioeconomic History   Marital  status: Married    Spouse name: Not on file   Number of children: Not on file   Years of education: Not on file   Highest education level: Not on file  Occupational History   Not on file  Tobacco Use   Smoking status: Former    Packs/day: 0.50    Types: Cigarettes    Start date: 02/19/1960    Quit date: 02/18/1965    Years since quitting: 55.7   Smokeless tobacco: Never  Vaping Use   Vaping Use: Never used  Substance and Sexual Activity   Alcohol use: No   Drug use: No   Sexual activity: Not Currently  Other Topics Concern   Not on file  Social History Narrative   Not on file   Social Determinants of Health   Financial Resource Strain: Not on file  Food Insecurity: Not on file  Transportation Needs: Not on file  Physical Activity: Not on file  Stress: Not on file  Social Connections: Not on file  Intimate Partner Violence: Not on file    Outpatient Medications Prior to Visit  Medication Sig Dispense Refill   acetaminophen (TYLENOL) 325 MG tablet Take 2 tablets (650 mg total) by mouth  every 6 (six) hours as needed for mild pain (or Fever >/= 101).     amLODipine (NORVASC) 5 MG tablet TAKE 1 TABLET BY MOUTH DAILY 90 tablet 0   aspirin EC 81 MG tablet Take 81 mg by mouth daily.     Calcium Carb-Cholecalciferol (CALCIUM 600-D PO) Take 1 tablet by mouth daily.     Cholecalciferol (VITAMIN D-3) 5000 units TABS Take 5,000 Units by mouth daily.     diphenhydrAMINE (BENADRYL) 25 mg capsule Take 25 mg by mouth every 6 (six) hours as needed.     DULoxetine (CYMBALTA) 60 MG capsule TAKE 2 CAPSULES BY MOUTH DAILY 180 capsule 0   fluticasone (CUTIVATE) 0.05 % cream Apply topically daily. 30 g 1   fluticasone (FLONASE) 50 MCG/ACT nasal spray 1 spray each nostril following sinus rinses twice daily 16 g 5   gabapentin (NEURONTIN) 300 MG capsule TAKE 3 CAPSULES BY MOUTH 3 TIMES DAILY 810 capsule 0   ketoconazole (NIZORAL) 2 % cream Apply 1 application topically daily. 15 g 1    levothyroxine (SYNTHROID) 25 MCG tablet TAKE 1 TABLET BY MOUTH DAILY 90 tablet 0   losartan (COZAAR) 100 MG tablet TAKE 1 TABLET BY MOUTH DAILY 90 tablet 0   niacin (NIASPAN) 1000 MG CR tablet TAKE 1 TABLET BY MOUTH EACH NIGHT AT BEDTIME 90 tablet 0   omega-3 acid ethyl esters (LOVAZA) 1 g capsule TAKE 2 CAPSULES BY MOUTH TWICE DAILY 360 capsule 0   oxybutynin (DITROPAN-XL) 10 MG 24 hr tablet Take 1 tablet (10 mg total) by mouth at bedtime. 90 tablet 1   simvastatin (ZOCOR) 20 MG tablet TAKE 1 TABLET BY MOUTH EACH NIGHT AT BEDTIME 90 tablet 0   traZODone (DESYREL) 100 MG tablet TAKE 1 TABLET BY MOUTH DAILY AT BEDTIME FOR SLEEP 90 tablet 0   UNABLE TO FIND FLUOROURACIL 5% + CALCIPOTRIENE 0.005%     No facility-administered medications prior to visit.    Allergies  Allergen Reactions   Avelox [Moxifloxacin Hcl In Nacl] Other (See Comments)    CHEST PAIN OF UNSPECIFIED ORIGIN   Cephalosporins Hives and Itching   Bee Venom    Other Itching and Swelling    UNSPECIFIED INSECT STINGS    ROS Review of Systems Review of Systems:  A fourteen system review of systems was performed and found to be positive as per HPI.   Objective:    Physical Exam General:  Well Developed, well nourished, appropriate for stated age.  Neuro:  Alert and oriented,  extra-ocular muscles intact  HEENT:  Normocephalic, atraumatic, neck supple Skin:  significant ecchymosis of right breast, mid-sternum and left lower leg. Cardiac:  RRR, S1 S2 Respiratory:  CTA B/L, Not using accessory muscles, speaking in full sentences- unlabored. MSK: tenderness of chest wall and right breast, mild swelling of left knee noted, Vascular:  Ext warm, no cyanosis apprec.; cap RF less 2 sec. Psych:  No HI/SI, judgement and insight good, Euthymic mood. Full Affect.  BP 114/72   Pulse 84   Temp 98.1 F (36.7 C)   Ht 5' 6"  (1.676 m)   Wt 212 lb (96.2 kg)   SpO2 97%   BMI 34.22 kg/m  Wt Readings from Last 3 Encounters:   11/20/20 212 lb (96.2 kg)  11/13/20 212 lb 1.6 oz (96.2 kg)  07/10/20 221 lb 12.8 oz (100.6 kg)     Health Maintenance Due  Topic Date Due   Zoster Vaccines- Shingrix (1 of 2) Never done  COVID-19 Vaccine (4 - Booster for Pfizer series) 04/04/2020    There are no preventive care reminders to display for this patient.  Lab Results  Component Value Date   TSH 3.220 11/13/2020   Lab Results  Component Value Date   WBC 8.9 11/15/2020   HGB 11.4 (L) 11/15/2020   HCT 35.9 (L) 11/15/2020   MCV 88.9 11/15/2020   PLT 334 11/15/2020   Lab Results  Component Value Date   NA 135 11/15/2020   K 3.8 11/15/2020   CO2 23 11/15/2020   GLUCOSE 113 (H) 11/15/2020   BUN 19 11/15/2020   CREATININE 0.91 11/15/2020   BILITOT 0.3 11/13/2020   ALKPHOS 86 11/13/2020   AST 18 11/13/2020   ALT 13 11/13/2020   PROT 6.8 11/13/2020   ALBUMIN 4.4 11/13/2020   CALCIUM 9.0 11/15/2020   ANIONGAP 7 11/15/2020   EGFR 70 11/13/2020   Lab Results  Component Value Date   CHOL 174 11/13/2020   Lab Results  Component Value Date   HDL 49 11/13/2020   Lab Results  Component Value Date   LDLCALC 103 (H) 11/13/2020   Lab Results  Component Value Date   TRIG 122 11/13/2020   Lab Results  Component Value Date   CHOLHDL 3.6 11/13/2020   Lab Results  Component Value Date   HGBA1C 6.1 (H) 11/13/2020      Assessment & Plan:   Problem List Items Addressed This Visit   None Visit Diagnoses     Hematoma of right breast    -  Primary   Relevant Orders   CBC w/Diff   Comp Met (CMET)   Trauma of chest, initial encounter       Fall, initial encounter          Fall, trauma of chest, hematoma of right breast: -Reviewed ED notes, labs and imaging. CT Chest abdomen pelvis w/ contrast: right breast/chest wall hematoma measuring 10.6x3.5 cm, no acute cardiopulmonary disease, no acute findings in the abdomen or pelvis.  -Will repeat CBC to monitor hemoglobin and hematocrit. -Recommend to  continue with ice therapy and Tylenol as needed for pain relief (max dose of 4g/day). -Discussed fall prevention strategies including home safety and good support shoes. -Continue with home health PT/OT.  No orders of the defined types were placed in this encounter.   Follow-up: Return if symptoms worsen or fail to improve.    Lorrene Reid, PA-C

## 2020-11-21 LAB — CBC WITH DIFFERENTIAL/PLATELET
Basophils Absolute: 0 10*3/uL (ref 0.0–0.2)
Basos: 0 %
EOS (ABSOLUTE): 0.6 10*3/uL — ABNORMAL HIGH (ref 0.0–0.4)
Eos: 6 %
Hematocrit: 36.6 % (ref 34.0–46.6)
Hemoglobin: 12.2 g/dL (ref 11.1–15.9)
Immature Grans (Abs): 0 10*3/uL (ref 0.0–0.1)
Immature Granulocytes: 0 %
Lymphocytes Absolute: 1.9 10*3/uL (ref 0.7–3.1)
Lymphs: 21 %
MCH: 29 pg (ref 26.6–33.0)
MCHC: 33.3 g/dL (ref 31.5–35.7)
MCV: 87 fL (ref 79–97)
Monocytes Absolute: 0.6 10*3/uL (ref 0.1–0.9)
Monocytes: 7 %
Neutrophils Absolute: 5.7 10*3/uL (ref 1.4–7.0)
Neutrophils: 66 %
Platelets: 367 10*3/uL (ref 150–450)
RBC: 4.2 x10E6/uL (ref 3.77–5.28)
RDW: 13.6 % (ref 11.7–15.4)
WBC: 8.8 10*3/uL (ref 3.4–10.8)

## 2020-11-21 LAB — COMPREHENSIVE METABOLIC PANEL
ALT: 11 IU/L (ref 0–32)
AST: 17 IU/L (ref 0–40)
Albumin/Globulin Ratio: 2 (ref 1.2–2.2)
Albumin: 4.1 g/dL (ref 3.7–4.7)
Alkaline Phosphatase: 79 IU/L (ref 44–121)
BUN/Creatinine Ratio: 15 (ref 12–28)
BUN: 15 mg/dL (ref 8–27)
Bilirubin Total: 0.4 mg/dL (ref 0.0–1.2)
CO2: 22 mmol/L (ref 20–29)
Calcium: 9.5 mg/dL (ref 8.7–10.3)
Chloride: 102 mmol/L (ref 96–106)
Creatinine, Ser: 0.99 mg/dL (ref 0.57–1.00)
Globulin, Total: 2.1 g/dL (ref 1.5–4.5)
Glucose: 150 mg/dL — ABNORMAL HIGH (ref 70–99)
Potassium: 4.2 mmol/L (ref 3.5–5.2)
Sodium: 139 mmol/L (ref 134–144)
Total Protein: 6.2 g/dL (ref 6.0–8.5)
eGFR: 58 mL/min/{1.73_m2} — ABNORMAL LOW (ref >59)

## 2020-12-06 DIAGNOSIS — Z961 Presence of intraocular lens: Secondary | ICD-10-CM | POA: Diagnosis not present

## 2020-12-06 DIAGNOSIS — H04123 Dry eye syndrome of bilateral lacrimal glands: Secondary | ICD-10-CM | POA: Diagnosis not present

## 2020-12-06 DIAGNOSIS — H02831 Dermatochalasis of right upper eyelid: Secondary | ICD-10-CM | POA: Diagnosis not present

## 2020-12-06 DIAGNOSIS — D3132 Benign neoplasm of left choroid: Secondary | ICD-10-CM | POA: Diagnosis not present

## 2020-12-06 DIAGNOSIS — H02834 Dermatochalasis of left upper eyelid: Secondary | ICD-10-CM | POA: Diagnosis not present

## 2020-12-12 ENCOUNTER — Other Ambulatory Visit: Payer: Self-pay | Admitting: Physician Assistant

## 2020-12-12 DIAGNOSIS — F5102 Adjustment insomnia: Secondary | ICD-10-CM

## 2021-01-30 ENCOUNTER — Other Ambulatory Visit: Payer: Self-pay | Admitting: Physician Assistant

## 2021-01-30 DIAGNOSIS — E782 Mixed hyperlipidemia: Secondary | ICD-10-CM

## 2021-01-30 DIAGNOSIS — E038 Other specified hypothyroidism: Secondary | ICD-10-CM

## 2021-02-01 DIAGNOSIS — L820 Inflamed seborrheic keratosis: Secondary | ICD-10-CM | POA: Diagnosis not present

## 2021-02-01 DIAGNOSIS — I8312 Varicose veins of left lower extremity with inflammation: Secondary | ICD-10-CM | POA: Diagnosis not present

## 2021-02-01 DIAGNOSIS — I8311 Varicose veins of right lower extremity with inflammation: Secondary | ICD-10-CM | POA: Diagnosis not present

## 2021-02-01 DIAGNOSIS — L570 Actinic keratosis: Secondary | ICD-10-CM | POA: Diagnosis not present

## 2021-02-01 DIAGNOSIS — Z85828 Personal history of other malignant neoplasm of skin: Secondary | ICD-10-CM | POA: Diagnosis not present

## 2021-02-01 DIAGNOSIS — I872 Venous insufficiency (chronic) (peripheral): Secondary | ICD-10-CM | POA: Diagnosis not present

## 2021-02-01 DIAGNOSIS — L918 Other hypertrophic disorders of the skin: Secondary | ICD-10-CM | POA: Diagnosis not present

## 2021-02-28 ENCOUNTER — Other Ambulatory Visit: Payer: Self-pay | Admitting: Physician Assistant

## 2021-03-05 ENCOUNTER — Other Ambulatory Visit: Payer: Self-pay | Admitting: Physician Assistant

## 2021-03-20 ENCOUNTER — Ambulatory Visit (INDEPENDENT_AMBULATORY_CARE_PROVIDER_SITE_OTHER): Payer: PPO | Admitting: Physician Assistant

## 2021-03-20 ENCOUNTER — Other Ambulatory Visit: Payer: Self-pay

## 2021-03-20 ENCOUNTER — Encounter: Payer: Self-pay | Admitting: Physician Assistant

## 2021-03-20 VITALS — BP 120/72 | HR 80 | Temp 97.6°F | Ht 66.0 in | Wt 207.0 lb

## 2021-03-20 DIAGNOSIS — Z0000 Encounter for general adult medical examination without abnormal findings: Secondary | ICD-10-CM | POA: Diagnosis not present

## 2021-03-20 DIAGNOSIS — F339 Major depressive disorder, recurrent, unspecified: Secondary | ICD-10-CM

## 2021-03-20 DIAGNOSIS — F419 Anxiety disorder, unspecified: Secondary | ICD-10-CM | POA: Diagnosis not present

## 2021-03-20 MED ORDER — DULOXETINE HCL 60 MG PO CPEP: 120.0000 mg | ORAL_CAPSULE | Freq: Every day | ORAL | 1 refills | Status: DC

## 2021-03-20 NOTE — Progress Notes (Deleted)
Subjective:   Shelly Hickman is a 81 y.o. female who presents for Medicare Annual (Subsequent) preventive examination.  Review of Systems    ***       Objective:    There were no vitals filed for this visit. There is no height or weight on file to calculate BMI.  Advanced Directives 09/03/2016 03/04/2016 02/22/2016 08/30/2015 07/01/2013 12/10/2012  Does Patient Have a Medical Advance Directive? Yes - Yes Yes Patient has advance directive, copy not in chart Patient has advance directive, copy not in chart  Type of Advance Directive [place];Living will - [place];Living will Living will;Healthcare Power of Attorney Living will Living will  Copy of [place] in Chart? - No - copy requested No - copy requested - - Copy requested from family  Pre-existing out of facility DNR order (yellow form or pink MOST form) - - - - - No    Current Medications (verified) Outpatient Encounter Medications as of 03/20/2021  Medication Sig   acetaminophen (TYLENOL) 325 MG tablet Take 2 tablets (650 mg total) by mouth every 6 (six) hours as needed for mild pain (or Fever >/= 101).   amLODipine (NORVASC) 5 MG tablet TAKE 1 TABLET BY MOUTH DAILY   aspirin EC 81 MG tablet Take 81 mg by mouth daily.   Calcium Carb-Cholecalciferol (CALCIUM 600-D PO) Take 1 tablet by mouth daily.   Cholecalciferol (VITAMIN D-3) 5000 units TABS Take 5,000 Units by mouth daily.   diphenhydrAMINE (BENADRYL) 25 mg capsule Take 25 mg by mouth every 6 (six) hours as needed.   DULoxetine (CYMBALTA) 60 MG capsule TAKE 2 CAPSULES BY MOUTH DAILY   fluticasone (CUTIVATE) 0.05 % cream Apply topically daily.   fluticasone (FLONASE) 50 MCG/ACT nasal spray 1 spray each nostril following sinus rinses twice daily   gabapentin (NEURONTIN) 300 MG capsule TAKE 3 CAPSULES BY MOUTH 3 TIMES DAILY   ketoconazole (NIZORAL) 2 % cream Apply 1 application topically daily.   levothyroxine  (SYNTHROID) 25 MCG tablet TAKE 1 TABLET BY MOUTH DAILY   losartan (COZAAR) 100 MG tablet TAKE 1 TABLET BY MOUTH DAILY   niacin (NIASPAN) 1000 MG CR tablet TAKE 1 TABLET BY MOUTH EACH NIGHT AT BEDTIME   omega-3 acid ethyl esters (LOVAZA) 1 g capsule TAKE 2 CAPSULES BY MOUTH TWICE DAILY   oxybutynin (DITROPAN-XL) 10 MG 24 hr tablet Take 1 tablet (10 mg total) by mouth at bedtime.   simvastatin (ZOCOR) 20 MG tablet TAKE 1 TABLET BY MOUTH EACH NIGHT AT BEDTIME   traZODone (DESYREL) 100 MG tablet TAKE 1 TABLET BY MOUTH DAILY AT BEDTIME FOR SLEEP   UNABLE TO FIND FLUOROURACIL 5% + CALCIPOTRIENE 0.005%   No facility-administered encounter medications on file as of 03/20/2021.    Allergies (verified) Avelox [moxifloxacin hcl in nacl], Cephalosporins, Bee venom, and Other   History: Past Medical History:  Diagnosis Date   Anemia    Anxiety    Arthritis    Depression    H/O hiatal hernia    Hypercholesterolemia    Hypertension    Incontinence of urine    Neuromuscular disorder ([place])    Osteoarthritis    Past Surgical History:  Procedure Laterality Date   2 Synovial Cysts removed between L4-L5     ABDOMINAL HYSTERECTOMY     BACK SURGERY     back fusion after 2 back surgeries    CARPAL TUNNEL RELEASE     COLONOSCOPY  EYE SURGERY     tube inserted for excessive tearing , tube is removed    Right Knee Arthroscopy     SALPINGOOPHORECTOMY  1993   TONSILLECTOMY     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   Procedure: TOTAL HIP ARTHROPLASTY;  Surgeon: Kerin Salen, MD;  Location: [facility];  Service: Orthopedics;  Laterality: Left;   TOTAL KNEE ARTHROPLASTY Right 03/04/2016   Procedure: TOTAL KNEE ARTHROPLASTY;  Surgeon: Elsie Saas, MD;  Location: [facility];  Service: Orthopedics;  Laterality: Right;   WISDOM TOOTH EXTRACTION     Family History  Problem Relation Age of Onset   Hypertension Mother    Diabetes Mother    Stroke Mother    Alcoholism Father    Alcohol abuse Father     Depression Sister    Social History   Socioeconomic History   Marital status: Married    Spouse name: Not on file   Number of children: Not on file   Years of education: Not on file   Highest education level: Not on file  Occupational History   Not on file  Tobacco Use   Smoking status: Former    Packs/day: 0.50    Types: Cigarettes    Start date: 02/19/1960    Quit date: 02/18/1965    Years since quitting: 30.1   Smokeless tobacco: Never  Vaping Use   Vaping Use: Never used  Substance and Sexual Activity   Alcohol use: No   Drug use: No   Sexual activity: Not Currently  Other Topics Concern   Not on file  Social History Narrative   Not on file   Social Determinants of Health   Financial Resource Strain: Not on file  Food Insecurity: Not on file  Transportation Needs: Not on file  Physical Activity: Not on file  Stress: Not on file  Social Connections: Not on file    Tobacco Counseling Counseling given: Not Answered   Clinical Intake:                 Diabetic?***         Activities of Daily Living In your present state of health, do you have any difficulty performing the following activities: 11/13/2020 07/10/2020  Hearing? N Y  Vision? N N  Difficulty concentrating or making decisions? Y N  Walking or climbing stairs? N Y  Dressing or bathing? N N  Doing errands, shopping? N N  Some recent data might be hidden    Patient Care Team: Lorrene Reid, PA-C as PCP - Lisa Roca, MD as Consulting Physician (Neurosurgery) Allyn Kenner, MD as Consulting Physician (Dermatology) Elsie Saas, MD as Consulting Physician (Orthopedic Surgery) [facility] [place], MD as Attending Physician Verner Chol, MD as Consulting Physician (Sports Medicine)  Indicate any recent Medical Services you may have received from other than [facility] providers in the past year (date may be approximate).     Assessment:   This is a routine wellness  examination for Shelly Hickman.  Hearing/Vision screen No results found.  Dietary issues and exercise activities discussed:     Goals Addressed   None   Depression Screen PHQ 2/9 Scores 11/13/2020 07/10/2020 05/09/2020 04/12/2020 01/07/2020 11/05/2019 10/07/2019  PHQ - 2 Score 2 1 2  0 0 1 4  PHQ- 9 Score 5 6 7 3 2 4 9     Fall Risk Fall Risk  11/13/2020 07/10/2020 05/09/2020 04/12/2020 01/07/2020  Falls in the past year? 1 1 0 1 0  Number falls in past yr: 1 0 - 0 -  Injury with Fall? 1 1 - 0 -  Risk for fall due to : History of fall(s);Impaired balance/gait Impaired balance/gait;Impaired mobility - History of fall(s) Impaired balance/gait  Follow up Falls evaluation completed Falls evaluation completed Falls evaluation completed Falls evaluation completed Falls evaluation completed    FALL RISK PREVENTION PERTAINING TO THE HOME:  Any stairs in or around the home? Yes  If so, are there any without handrails? No  Home free of loose throw rugs in walkways, pet beds, electrical cords, etc? Yes  Adequate lighting in your home to reduce risk of falls? Yes   ASSISTIVE DEVICES UTILIZED TO PREVENT FALLS:  Life alert? No  Use of a cane, walker or w/c? No  Grab bars in the bathroom? Yes  Shower chair or bench in shower? Yes  Elevated toilet seat or a handicapped toilet? Yes   TIMED UP AND GO:  Was the test performed? Yes .  Length of time to ambulate 10 feet: 15 sec.   Gait slow and steady without use of assistive device  Cognitive Function:     6CIT Screen 03/20/2021 05/17/2019 12/30/2016  What Year? 0 points 0 points 0 points  What month? 0 points 0 points 0 points  What time? 0 points 0 points 0 points  Count back from 20 0 points 0 points 0 points  Months in reverse 0 points 0 points 0 points  Repeat phrase 0 points 0 points 0 points  Total Score 0 0 0    Immunizations Immunization History  Administered Date(s) Administered   Fluad Quad(high Dose 65+) 10/15/2018, 11/13/2020    IPV 01/20/2010   Influenza Split 01/23/2010   Influenza, High Dose Seasonal PF 11/17/2015, 11/22/2016   Influenza-Unspecified 11/26/2017   PFIZER(Purple Top)SARS-COV-2 Vaccination 03/13/2019, 04/03/2019, 12/03/2019   Pfizer Covid-19 Vaccine Bivalent Booster 86yrs & up 09/21/2020   Pneumococcal Conjugate-13 03/25/2008, 11/16/2013   Pneumococcal Polysaccharide-23 03/25/2008, 11/16/2012   Tdap 02/18/2010, 12/28/2015   Zoster, Live 02/19/2007, 11/16/2013    TDAP status: Up to date  Flu Vaccine status: Up to date  Pneumococcal vaccine status: Up to date  Covid-19 vaccine status: Completed vaccines  Qualifies for Shingles Vaccine? Yes   Zostavax completed No   Shingrix Completed?: No.    Education has been provided regarding the importance of this vaccine. Patient has been advised to call insurance company to determine out of pocket expense if they have not yet received this vaccine. Advised may also receive vaccine at local pharmacy or Health Dept. Verbalized acceptance and understanding.  Screening Tests Health Maintenance  Topic Date Due   Zoster Vaccines- Shingrix (1 of 2) Never done   TETANUS/TDAP  12/27/2025   Pneumonia Vaccine 23+ Years old  Completed   INFLUENZA VACCINE  Completed   DEXA SCAN  Completed   COVID-19 Vaccine  Completed   Hepatitis C Screening  Completed   HPV VACCINES  Aged Out    Health Maintenance  Health Maintenance Due  Topic Date Due   Zoster Vaccines- Shingrix (1 of 2) Never done    Colorectal cancer screening: No longer required.   Mammogram status: Ordered 03/20/2021. Pt provided with contact info and advised to call to schedule appt.   Bone Density status: Completed 07/22/2019. Results reflect: Bone density results: NORMAL. Repeat every 2 years.  Lung Cancer Screening: (Low Dose CT Chest recommended if Age 105-80 years, 30 pack-year currently smoking OR have quit w/in 15years.) does not qualify.  Lung Cancer Screening Referral:  ***  Additional Screening:  Hepatitis C Screening: does qualify; Patient declined screening.   Vision Screening: Recommended annual ophthalmology exams for early detection of glaucoma and other disorders of the eye. Is the patient up to date with their annual eye exam?  Yes  Who is the provider or what is the name of the office in which the patient attends annual eye exams? Dr. Katy Fitch  If pt is not established with a provider, would they like to be referred to a provider to establish care? {YES/NO:21197}.   Dental Screening: Recommended annual dental exams for proper oral hygiene  Community Resource Referral / Chronic Care Management: CRR required this visit?  {YES/NO:21197}  CCM required this visit?  {YES/NO:21197}     Plan:     I have personally reviewed and noted the following in the patients chart:   Medical and social history Use of alcohol, tobacco or illicit drugs  Current medications and supplements including opioid prescriptions.  Functional ability and status Nutritional status Physical activity Advanced directives List of other physicians Hospitalizations, surgeries, and ER visits in previous 12 months Vitals Screenings to include cognitive, depression, and falls Referrals and appointments  In addition, I have reviewed and discussed with patient certain preventive protocols, quality metrics, and best practice recommendations. A written personalized care plan for preventive services as well as general preventive health recommendations were provided to patient.     Aron Baba, [place]   03/20/2021   Nurse Notes: ***

## 2021-03-20 NOTE — Patient Instructions (Signed)
Preventive Care 65 Years and Older, Female °Preventive care refers to lifestyle choices and visits with your health care provider that can promote health and wellness. Preventive care visits are also called wellness exams. °What can I expect for my preventive care visit? °Counseling °Your health care provider may ask you questions about your: °Medical history, including: °Past medical problems. °Family medical history. °Pregnancy and menstrual history. °History of falls. °Current health, including: °Memory and ability to understand (cognition). °Emotional well-being. °Home life and relationship well-being. °Sexual activity and sexual health. °Lifestyle, including: °Alcohol, nicotine or tobacco, and drug use. °Access to firearms. °Diet, exercise, and sleep habits. °Work and work environment. °Sunscreen use. °Safety issues such as seatbelt and bike helmet use. °Physical exam °Your health care provider will check your: °Height and weight. These may be used to calculate your BMI (body mass index). BMI is a measurement that tells if you are at a healthy weight. °Waist circumference. This measures the distance around your waistline. This measurement also tells if you are at a healthy weight and may help predict your risk of certain diseases, such as type 2 diabetes and high blood pressure. °Heart rate and blood pressure. °Body temperature. °Skin for abnormal spots. °What immunizations do I need? °Vaccines are usually given at various ages, according to a schedule. Your health care provider will recommend vaccines for you based on your age, medical history, and lifestyle or other factors, such as travel or where you work. °What tests do I need? °Screening °Your health care provider may recommend screening tests for certain conditions. This may include: °Lipid and cholesterol levels. °Hepatitis C test. °Hepatitis B test. °HIV (human immunodeficiency virus) test. °STI (sexually transmitted infection) testing, if you are at  risk. °Lung cancer screening. °Colorectal cancer screening. °Diabetes screening. This is done by checking your blood sugar (glucose) after you have not eaten for a while (fasting). °Mammogram. Talk with your health care provider about how often you should have regular mammograms. °BRCA-related cancer screening. This may be done if you have a family history of breast, ovarian, tubal, or peritoneal cancers. °Bone density scan. This is done to screen for osteoporosis. °Talk with your health care provider about your test results, treatment options, and if necessary, the need for more tests. °Follow these instructions at home: °Eating and drinking ° °Eat a diet that includes fresh fruits and vegetables, whole grains, lean protein, and low-fat dairy products. Limit your intake of foods with high amounts of sugar, saturated fats, and salt. °Take vitamin and mineral supplements as recommended by your health care provider. °Do not drink alcohol if your health care provider tells you not to drink. °If you drink alcohol: °Limit how much you have to 0-1 drink a day. °Know how much alcohol is in your drink. In the [place] one drink equals one 12 oz bottle of beer (355 mL), one 5 oz glass of wine (148 mL), or one 1½ oz glass of hard liquor (44 mL). °Lifestyle °Brush your teeth every morning and night with fluoride toothpaste. Floss one time each day. °Exercise for at least 30 minutes 5 or more days each week. °Do not use any products that contain nicotine or tobacco. These products include cigarettes, chewing tobacco, and vaping devices, such as e-cigarettes. If you need help quitting, ask your health care provider. °Do not use drugs. °If you are sexually active, practice safe sex. Use a condom or other form of protection in order to prevent STIs. °Take aspirin only as told by your   health care provider. Make sure that you understand how much to take and what form to take. Work with your health care provider to find out whether it  is safe and beneficial for you to take aspirin daily. Ask your health care provider if you need to take a cholesterol-lowering medicine (statin). Find healthy ways to manage stress, such as: Meditation, yoga, or listening to music. Journaling. Talking to a trusted person. Spending time with friends and family. Minimize exposure to UV radiation to reduce your risk of skin cancer. Safety Always wear your seat belt while driving or riding in a vehicle. Do not drive: If you have been drinking alcohol. Do not ride with someone who has been drinking. When you are tired or distracted. While texting. If you have been using any mind-altering substances or drugs. Wear a helmet and other protective equipment during sports activities. If you have firearms in your house, make sure you follow all gun safety procedures. What's next? Visit your health care provider once a year for an annual wellness visit. Ask your health care provider how often you should have your eyes and teeth checked. Stay up to date on all vaccines. This information is not intended to replace advice given to you by your health care provider. Make sure you discuss any questions you have with your health care provider. Document Revised: 08/02/2020 Document Reviewed: 08/02/2020 Elsevier Patient Education  [place].

## 2021-03-20 NOTE — Progress Notes (Signed)
Subjective:   Shelly Hickman is a 81 y.o. female who presents for Medicare Annual (Subsequent) preventive examination.  Review of Systems    General:   No F/C, wt loss Pulm:   No DIB, SOB, pleuritic chest pain Card:  No CP, palpitations Abd:  No n/v/d or pain Ext:  No inc edema from baseline    Objective:    Today's Vitals   03/20/21 0930 03/20/21 1030  BP: (!) 119/47 120/72  Pulse: 80   Temp: 97.6 F (36.4 C)   SpO2: 96%   Weight: 207 lb (93.9 kg)   Height: 5\' 6"  (1.676 m)    Body mass index is 33.41 kg/m.  Advanced Directives 09/03/2016 03/04/2016 02/22/2016 08/30/2015 07/01/2013 12/10/2012  Does Patient Have a Medical Advance Directive? Yes - Yes Yes Patient has advance directive, copy not in chart Patient has advance directive, copy not in chart  Type of Advance Directive [place];Living will - [place];Living will Living will;Healthcare Power of Attorney Living will Living will  Copy of [place] in Chart? - No - copy requested No - copy requested - - Copy requested from family  Pre-existing out of facility DNR order (yellow form or pink MOST form) - - - - - No    Current Medications (verified) Outpatient Encounter Medications as of 03/20/2021  Medication Sig   acetaminophen (TYLENOL) 325 MG tablet Take 2 tablets (650 mg total) by mouth every 6 (six) hours as needed for mild pain (or Fever >/= 101).   amLODipine (NORVASC) 5 MG tablet TAKE 1 TABLET BY MOUTH DAILY   aspirin EC 81 MG tablet Take 81 mg by mouth daily.   Calcium Carb-Cholecalciferol (CALCIUM 600-D PO) Take 1 tablet by mouth daily.   Cholecalciferol (VITAMIN D-3) 5000 units TABS Take 5,000 Units by mouth daily.   diphenhydrAMINE (BENADRYL) 25 mg capsule Take 25 mg by mouth every 6 (six) hours as needed.   fluticasone (CUTIVATE) 0.05 % cream Apply topically daily.   fluticasone (FLONASE) 50 MCG/ACT nasal spray 1 spray each nostril following sinus  rinses twice daily   gabapentin (NEURONTIN) 300 MG capsule TAKE 3 CAPSULES BY MOUTH 3 TIMES DAILY   ketoconazole (NIZORAL) 2 % cream Apply 1 application topically daily.   levothyroxine (SYNTHROID) 25 MCG tablet TAKE 1 TABLET BY MOUTH DAILY   losartan (COZAAR) 100 MG tablet TAKE 1 TABLET BY MOUTH DAILY   niacin (NIASPAN) 1000 MG CR tablet TAKE 1 TABLET BY MOUTH EACH NIGHT AT BEDTIME   omega-3 acid ethyl esters (LOVAZA) 1 g capsule TAKE 2 CAPSULES BY MOUTH TWICE DAILY   oxybutynin (DITROPAN-XL) 10 MG 24 hr tablet Take 1 tablet (10 mg total) by mouth at bedtime.   simvastatin (ZOCOR) 20 MG tablet TAKE 1 TABLET BY MOUTH EACH NIGHT AT BEDTIME   traZODone (DESYREL) 100 MG tablet TAKE 1 TABLET BY MOUTH DAILY AT BEDTIME FOR SLEEP   UNABLE TO FIND FLUOROURACIL 5% + CALCIPOTRIENE 0.005%   [DISCONTINUED] DULoxetine (CYMBALTA) 60 MG capsule TAKE 2 CAPSULES BY MOUTH DAILY   DULoxetine (CYMBALTA) 60 MG capsule Take 2 capsules (120 mg total) by mouth daily.   No facility-administered encounter medications on file as of 03/20/2021.    Allergies (verified) Avelox [moxifloxacin hcl in nacl], Cephalosporins, Bee venom, and Other   History: Past Medical History:  Diagnosis Date   Anemia    Anxiety    Arthritis    Depression    H/O hiatal hernia  Hypercholesterolemia    Hypertension    Incontinence of urine    Neuromuscular disorder ([place])    Osteoarthritis    Past Surgical History:  Procedure Laterality Date   2 Synovial Cysts removed between L4-L5     ABDOMINAL HYSTERECTOMY     BACK SURGERY     back fusion after 2 back surgeries    CARPAL TUNNEL RELEASE     COLONOSCOPY     EYE SURGERY     tube inserted for excessive tearing , tube is removed    Right Knee Arthroscopy     SALPINGOOPHORECTOMY  1993   TONSILLECTOMY     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   Procedure: TOTAL HIP ARTHROPLASTY;  Surgeon: Kerin Salen, MD;  Location: [facility];  Service: Orthopedics;  Laterality: Left;   TOTAL  KNEE ARTHROPLASTY Right 03/04/2016   Procedure: TOTAL KNEE ARTHROPLASTY;  Surgeon: Elsie Saas, MD;  Location: [facility];  Service: Orthopedics;  Laterality: Right;   WISDOM TOOTH EXTRACTION     Family History  Problem Relation Age of Onset   Hypertension Mother    Diabetes Mother    Stroke Mother    Alcoholism Father    Alcohol abuse Father    Depression Sister    Social History   Socioeconomic History   Marital status: Married    Spouse name: Not on file   Number of children: Not on file   Years of education: Not on file   Highest education level: Not on file  Occupational History   Not on file  Tobacco Use   Smoking status: Former    Packs/day: 0.50    Types: Cigarettes    Start date: 02/19/1960    Quit date: 02/18/1965    Years since quitting: 56.1   Smokeless tobacco: Never  Vaping Use   Vaping Use: Never used  Substance and Sexual Activity   Alcohol use: No   Drug use: No   Sexual activity: Not Currently  Other Topics Concern   Not on file  Social History Narrative   Not on file   Social Determinants of Health   Financial Resource Strain: Not on file  Food Insecurity: Not on file  Transportation Needs: Not on file  Physical Activity: Not on file  Stress: Not on file  Social Connections: Not on file    Tobacco Counseling Counseling given: Not Answered   Diabetic?no    Activities of Daily Living In your present state of health, do you have any difficulty performing the following activities: 03/20/2021 11/13/2020  Hearing? N N  Vision? N N  Difficulty concentrating or making decisions? N Y  Walking or climbing stairs? N N  Dressing or bathing? N N  Doing errands, shopping? N N  Some recent data might be hidden    Patient Care Team: Lorrene Reid, PA-C as PCP - Lisa Roca, MD as Consulting Physician (Neurosurgery) Allyn Kenner, MD as Consulting Physician (Dermatology) Elsie Saas, MD as Consulting Physician (Orthopedic  Surgery) [facility] [place], MD as Attending Physician Verner Chol, MD as Consulting Physician (Sports Medicine)  Indicate any recent Medical Services you may have received from other than [facility] providers in the past year (date may be approximate).     Assessment:   This is a routine wellness examination for [place].  Hearing/Vision screen No results found.  Dietary issues and exercise activities discussed: -Working on weight loss by changing her diet and staying active around the house. Recommend a heart healthy  diet such as Mediterranean diet.   Goals Addressed   None   Depression Screen PHQ 2/9 Scores 03/20/2021 11/13/2020 07/10/2020 05/09/2020 04/12/2020 01/07/2020 11/05/2019  PHQ - 2 Score 2 2 1 2  0 0 1  PHQ- 9 Score 9 5 6 7 3 2 4     Fall Risk Fall Risk  03/20/2021 11/13/2020 07/10/2020 05/09/2020 04/12/2020  Falls in the past year? 1 1 1  0 1  Number falls in past yr: 1 1 0 - 0  Injury with Fall? 1 1 1  - 0  Risk for fall due to : History of fall(s) History of fall(s);Impaired balance/gait Impaired balance/gait;Impaired mobility - History of fall(s)  Follow up Falls evaluation completed Falls evaluation completed Falls evaluation completed Falls evaluation completed Falls evaluation completed    [place]:  Any stairs in or around the home? Yes  If so, are there any without handrails? No  Home free of loose throw rugs in walkways, pet beds, electrical cords, etc? Yes  Adequate lighting in your home to reduce risk of falls? Yes   ASSISTIVE DEVICES UTILIZED TO PREVENT FALLS:  Life alert? No  Use of a cane, walker or w/c? No  Grab bars in the bathroom? Yes  Shower chair or bench in shower? Yes  Elevated toilet seat or a handicapped toilet? Yes   TIMED UP AND GO:  Was the test performed? Yes .  Length of time to ambulate 10 feet: 15 sec.   Gait slow and steady without use of assistive device  Cognitive Function: wnl's    6CIT Screen 03/20/2021 05/17/2019 12/30/2016  What Year? 0 points 0 points 0 points  What month? 0 points 0 points 0 points  What time? 0 points 0 points 0 points  Count back from 20 0 points 0 points 0 points  Months in reverse 0 points 0 points 0 points  Repeat phrase 0 points 0 points 0 points  Total Score 0 0 0    Immunizations Immunization History  Administered Date(s) Administered   Fluad Quad(high Dose 65+) 10/15/2018, 11/13/2020   IPV 01/20/2010   Influenza Split 01/23/2010   Influenza, High Dose Seasonal PF 11/17/2015, 11/22/2016   Influenza-Unspecified 11/26/2017   PFIZER(Purple Top)SARS-COV-2 Vaccination 03/13/2019, 04/03/2019, 12/03/2019   Pfizer Covid-19 Vaccine Bivalent Booster 42yrs & up 09/21/2020   Pneumococcal Conjugate-13 03/25/2008, 11/16/2013   Pneumococcal Polysaccharide-23 03/25/2008, 11/16/2012   Tdap 02/18/2010, 12/28/2015   Zoster, Live 02/19/2007, 11/16/2013    TDAP status: Up to date  Flu Vaccine status: Up to date  Pneumococcal vaccine status: Up to date  Covid-19 vaccine status: Completed vaccines  Qualifies for Shingles Vaccine? Yes   Zostavax completed No   Shingrix Completed?: No.    Education has been provided regarding the importance of this vaccine. Patient has been advised to call insurance company to determine out of pocket expense if they have not yet received this vaccine. Advised may also receive vaccine at local pharmacy or Health Dept. Verbalized acceptance and understanding.  Screening Tests Health Maintenance  Topic Date Due   Zoster Vaccines- Shingrix (1 of 2) Never done   TETANUS/TDAP  12/27/2025   Pneumonia Vaccine 54+ Years old  Completed   INFLUENZA VACCINE  Completed   DEXA SCAN  Completed   COVID-19 Vaccine  Completed   Hepatitis C Screening  Completed   HPV VACCINES  Aged Out    Health Maintenance  Health Maintenance Due  Topic Date Due   Zoster Vaccines- Shingrix (  1 of 2) Never done    Colorectal cancer  screening: No longer required.   Mammogram status: Ordered 03/20/2021. Pt provided with contact info and advised to call to schedule appt.   Bone Density status: Completed 07/22/2019. Results reflect: Bone density results: NORMAL. Repeat every 2 years.  Lung Cancer Screening: (Low Dose CT Chest recommended if Age 38-80 years, 30 pack-year currently smoking OR have quit w/in 15years.) does not qualify.   Lung Cancer Screening Referral: n/a  Additional Screening:  Hepatitis C Screening: does qualify; Patient declined screening.   Vision Screening: Recommended annual ophthalmology exams for early detection of glaucoma and other disorders of the eye. Is the patient up to date with their annual eye exam?  Yes  Who is the provider or what is the name of the office in which the patient attends annual eye exams? Dr. Katy Fitch  If pt is not established with a provider, would they like to be referred to a provider to establish care? No .   Dental Screening: Recommended annual dental exams for proper oral hygiene  Community Resource Referral / Chronic Care Management: CRR required this visit?  No   CCM required this visit?  No      Plan:  -Right breast hematoma gradually improving, will defer mammogram until hematoma smaller in size. Pt verbalized understanding. -Diastolic BP low, repeated and improved. -Will obtain routine fasting labs at follow-up visit. -Advised to follow-up with [facility] regarding last colonoscopy recommendations. -Follow up in 4 months for reg OV.  I have personally reviewed and noted the following in the patients chart:   Medical and social history Use of alcohol, tobacco or illicit drugs  Current medications and supplements including opioid prescriptions.  Functional ability and status Nutritional status Physical activity Advanced directives List of other physicians Hospitalizations, surgeries, and ER visits in previous 12 months Vitals Screenings  to include cognitive, depression, and falls Referrals and appointments  In addition, I have reviewed and discussed with patient certain preventive protocols, quality metrics, and best practice recommendations. A written personalized care plan for preventive services as well as general preventive health recommendations were provided to patient.     Lorrene Reid, PA-C   03/20/2021

## 2021-03-22 ENCOUNTER — Other Ambulatory Visit: Payer: Self-pay | Admitting: Physician Assistant

## 2021-03-22 DIAGNOSIS — F5102 Adjustment insomnia: Secondary | ICD-10-CM

## 2021-05-02 DIAGNOSIS — M1712 Unilateral primary osteoarthritis, left knee: Secondary | ICD-10-CM | POA: Diagnosis not present

## 2021-05-14 DIAGNOSIS — Z1231 Encounter for screening mammogram for malignant neoplasm of breast: Secondary | ICD-10-CM | POA: Diagnosis not present

## 2021-05-14 LAB — HM MAMMOGRAPHY

## 2021-05-17 ENCOUNTER — Encounter: Payer: Self-pay | Admitting: Physician Assistant

## 2021-05-18 DIAGNOSIS — N6489 Other specified disorders of breast: Secondary | ICD-10-CM | POA: Diagnosis not present

## 2021-05-18 DIAGNOSIS — R928 Other abnormal and inconclusive findings on diagnostic imaging of breast: Secondary | ICD-10-CM | POA: Diagnosis not present

## 2021-05-18 DIAGNOSIS — R922 Inconclusive mammogram: Secondary | ICD-10-CM | POA: Diagnosis not present

## 2021-05-21 ENCOUNTER — Encounter: Payer: Self-pay | Admitting: Physician Assistant

## 2021-05-30 DIAGNOSIS — N6312 Unspecified lump in the right breast, upper inner quadrant: Secondary | ICD-10-CM | POA: Diagnosis not present

## 2021-05-30 DIAGNOSIS — N641 Fat necrosis of breast: Secondary | ICD-10-CM | POA: Diagnosis not present

## 2021-06-04 ENCOUNTER — Encounter: Payer: Self-pay | Admitting: Physician Assistant

## 2021-06-18 ENCOUNTER — Other Ambulatory Visit: Payer: Self-pay | Admitting: Physician Assistant

## 2021-06-18 DIAGNOSIS — F5102 Adjustment insomnia: Secondary | ICD-10-CM

## 2021-06-21 ENCOUNTER — Other Ambulatory Visit: Payer: Self-pay | Admitting: Physician Assistant

## 2021-06-21 DIAGNOSIS — I10 Essential (primary) hypertension: Secondary | ICD-10-CM

## 2021-07-18 ENCOUNTER — Ambulatory Visit (INDEPENDENT_AMBULATORY_CARE_PROVIDER_SITE_OTHER): Payer: PPO | Admitting: Physician Assistant

## 2021-07-18 ENCOUNTER — Encounter: Payer: Self-pay | Admitting: Physician Assistant

## 2021-07-18 VITALS — BP 108/68 | HR 93 | Temp 97.7°F | Ht 66.0 in | Wt 215.0 lb

## 2021-07-18 DIAGNOSIS — E785 Hyperlipidemia, unspecified: Secondary | ICD-10-CM | POA: Diagnosis not present

## 2021-07-18 DIAGNOSIS — F5102 Adjustment insomnia: Secondary | ICD-10-CM

## 2021-07-18 DIAGNOSIS — Z860100 Personal history of colon polyps, unspecified: Secondary | ICD-10-CM | POA: Insufficient documentation

## 2021-07-18 DIAGNOSIS — R7303 Prediabetes: Secondary | ICD-10-CM | POA: Diagnosis not present

## 2021-07-18 DIAGNOSIS — E038 Other specified hypothyroidism: Secondary | ICD-10-CM | POA: Diagnosis not present

## 2021-07-18 DIAGNOSIS — I10 Essential (primary) hypertension: Secondary | ICD-10-CM | POA: Diagnosis not present

## 2021-07-18 DIAGNOSIS — I209 Angina pectoris, unspecified: Secondary | ICD-10-CM | POA: Insufficient documentation

## 2021-07-18 DIAGNOSIS — E7800 Pure hypercholesterolemia, unspecified: Secondary | ICD-10-CM | POA: Insufficient documentation

## 2021-07-18 DIAGNOSIS — F339 Major depressive disorder, recurrent, unspecified: Secondary | ICD-10-CM

## 2021-07-18 DIAGNOSIS — R490 Dysphonia: Secondary | ICD-10-CM | POA: Insufficient documentation

## 2021-07-18 DIAGNOSIS — F419 Anxiety disorder, unspecified: Secondary | ICD-10-CM | POA: Diagnosis not present

## 2021-07-18 DIAGNOSIS — L304 Erythema intertrigo: Secondary | ICD-10-CM | POA: Diagnosis not present

## 2021-07-18 DIAGNOSIS — K649 Unspecified hemorrhoids: Secondary | ICD-10-CM | POA: Insufficient documentation

## 2021-07-18 HISTORY — DX: Angina pectoris, unspecified: I20.9

## 2021-07-18 MED ORDER — KETOCONAZOLE 2 % EX CREA: 1.0000 "application " | TOPICAL_CREAM | Freq: Every day | CUTANEOUS | 1 refills | Status: DC

## 2021-07-18 MED ORDER — FLUTICASONE PROPIONATE 0.05 % EX CREA: TOPICAL_CREAM | Freq: Every day | CUTANEOUS | 1 refills | Status: DC

## 2021-07-18 NOTE — Assessment & Plan Note (Signed)
>>  ASSESSMENT AND PLAN FOR HLD (HYPERLIPIDEMIA) WRITTEN ON 07/18/2021  9:28 AM BY ABONZA, MARITZA, PA-C  -Last lipid panel: HDL 44, LDL 103 -Will repeat lipid panel and hepatic function today. -Continue current medication regimen. If LDL remains elevated recommend increasing Simvastatin to 40 mg. -Will continue to monitor.

## 2021-07-18 NOTE — Assessment & Plan Note (Signed)
>>  ASSESSMENT AND PLAN FOR SUBCLINICAL HYPOTHYROIDISM-WITH SYMPTOMS OF FATIGUE, HAIR LOSS ETC. WRITTEN ON 07/18/2021  9:26 AM BY ABONZA, MARITZA, PA-C  -Discussed improving medication compliance. Will collect TSH. Will continue to monitor.

## 2021-07-18 NOTE — Assessment & Plan Note (Signed)
-  Last A1c 6.1, will repeat A1c today. Recommend to reduce sugar intake and monitor carbohydrates. Will continue to monitor.

## 2021-07-18 NOTE — Patient Instructions (Signed)

## 2021-07-18 NOTE — Assessment & Plan Note (Signed)
-  Stable. -Continue current medication regimen.  -Will continue to monitor. 

## 2021-07-18 NOTE — Assessment & Plan Note (Signed)
-  Last lipid panel: HDL 44, LDL 103 -Will repeat lipid panel and hepatic function today. -Continue current medication regimen. If LDL remains elevated recommend increasing Simvastatin to 40 mg. -Will continue to monitor.

## 2021-07-18 NOTE — Progress Notes (Signed)
Established patient visit   Patient: Shelly Hickman   DOB: 12-21-1940   81 y.o. Female  MRN: [id number] Visit Date: 07/18/2021  Chief Complaint  Patient presents with   Follow-up   Subjective    HPI  Patient presents for chronic follow-up.  HTN: Pt denies chest pain, palpitations, dizziness or lower extremity swelling. Taking medication as directed without side effects. Denies excessive sodium intake.   HLD: Pt taking medication as directed without issues.   Mood: Patient reports stress is about the same. Overall her mood has been stable. Reports medication compliance. No SI/HI.   Insomnia: Reports takes trazodone as needed for sleep which sometimes helps  and others does not.  Thyroid: Reports has missed several doses of medication.   Prediabetes: Pt denies increased thirst or hunger. Reports consuming sweets which is her coping mechanism when stressed.   Medications: Outpatient Medications Prior to Visit  Medication Sig Note   acetaminophen (TYLENOL) 325 MG tablet Take 2 tablets (650 mg total) by mouth every 6 (six) hours as needed for mild pain (or Fever >/= 101).    amLODipine (NORVASC) 5 MG tablet TAKE 1 TABLET BY MOUTH DAILY    aspirin EC 81 MG tablet Take 81 mg by mouth daily.    Calcium Carb-Cholecalciferol (CALCIUM 600-D PO) Take 1 tablet by mouth daily.    Cholecalciferol (VITAMIN D-3) 5000 units TABS Take 5,000 Units by mouth daily.    diphenhydrAMINE (BENADRYL) 25 mg capsule Take 25 mg by mouth every 6 (six) hours as needed.    DULoxetine (CYMBALTA) 60 MG capsule Take 2 capsules (120 mg total) by mouth daily.    fluticasone (FLONASE) 50 MCG/ACT nasal spray 1 spray each nostril following sinus rinses twice daily    levothyroxine (SYNTHROID) 25 MCG tablet TAKE 1 TABLET BY MOUTH DAILY    losartan (COZAAR) 100 MG tablet TAKE 1 TABLET BY MOUTH DAILY    niacin (NIASPAN) 1000 MG CR tablet TAKE 1 TABLET BY MOUTH EACH NIGHT AT BEDTIME    omega-3 acid ethyl esters  (LOVAZA) 1 g capsule TAKE 2 CAPSULES BY MOUTH TWICE DAILY    oxybutynin (DITROPAN-XL) 10 MG 24 hr tablet Take 1 tablet (10 mg total) by mouth at bedtime.    simvastatin (ZOCOR) 20 MG tablet TAKE 1 TABLET BY MOUTH EACH NIGHT AT BEDTIME    traZODone (DESYREL) 100 MG tablet TAKE 1 TABLET BY MOUTH DAILY AT BEDTIME FOR SLEEP    UNABLE TO FIND FLUOROURACIL 5% + CALCIPOTRIENE 0.005%    [DISCONTINUED] fluticasone (CUTIVATE) 0.05 % cream Apply topically daily.    [DISCONTINUED] gabapentin (NEURONTIN) 300 MG capsule TAKE 3 CAPSULES BY MOUTH 3 TIMES DAILY 07/18/2021: pt stopped medication   [DISCONTINUED] ketoconazole (NIZORAL) 2 % cream Apply 1 application topically daily.    No facility-administered medications prior to visit.    Review of Systems Review of Systems:  A fourteen system review of systems was performed and found to be positive as per HPI.   Last CBC Lab Results  Component Value Date   WBC 8.8 11/20/2020   HGB 12.2 11/20/2020   HCT 36.6 11/20/2020   MCV 87 11/20/2020   MCH 29.0 11/20/2020   RDW 13.6 11/20/2020   PLT 367 31/54/0086   Last metabolic panel Lab Results  Component Value Date   GLUCOSE 150 (H) 11/20/2020   NA 139 11/20/2020   K 4.2 11/20/2020   CL 102 11/20/2020   CO2 22 11/20/2020   BUN 15 11/20/2020   CREATININE  0.99 11/20/2020   EGFR 58 (L) 11/20/2020   CALCIUM 9.5 11/20/2020   PROT 6.2 11/20/2020   ALBUMIN 4.1 11/20/2020   LABGLOB 2.1 11/20/2020   AGRATIO 2.0 11/20/2020   BILITOT 0.4 11/20/2020   ALKPHOS 79 11/20/2020   AST 17 11/20/2020   ALT 11 11/20/2020   ANIONGAP 7 11/15/2020   Last lipids Lab Results  Component Value Date   CHOL 174 11/13/2020   HDL 49 11/13/2020   LDLCALC 103 (H) 11/13/2020   TRIG 122 11/13/2020   CHOLHDL 3.6 11/13/2020   Last hemoglobin A1c Lab Results  Component Value Date   HGBA1C 6.1 (H) 11/13/2020   Last thyroid functions Lab Results  Component Value Date   TSH 3.220 11/13/2020   Last vitamin D Lab  Results  Component Value Date   VD25OH 36.9 11/13/2020      07/18/2021    9:07 AM 03/20/2021    9:35 AM 11/13/2020   11:12 AM 07/10/2020   11:07 AM 05/09/2020    3:17 PM  Depression screen PHQ 2/9  Decreased Interest 0 1 1 0 1  Down, Depressed, Hopeless _0 PHQ - 2 Score _1 Altered sleeping 1 1 0 1 1  Tired, decreased energy _2 Change in appetite _3 Feeling bad or failure about yourself  _4 Trouble concentrating 0 1 0 0 0  Moving slowly or fidgety/restless 0 0 0 0 0  Suicidal thoughts 0 0 0 0 0  PHQ-9 Score _5 Difficult doing work/chores Somewhat difficult Somewhat difficult Somewhat difficult Not difficult at all       07/18/2021    9:08 AM 03/20/2021    9:35 AM 11/13/2020   11:12 AM 07/10/2020   11:07 AM  GAD 7 : Generalized Anxiety Score  Nervous, Anxious, on Edge 0 0 0 1  Control/stop worrying _6 Worry too much - different things _7 Trouble relaxing 1 0 0 1  Restless 0 0 0 0  Easily annoyed or irritable _8 Afraid - awful might happen 0 0 0   Total GAD 7 Score _9 Anxiety Difficulty Somewhat difficult Not difficult at all Somewhat difficult Somewhat difficult        Objective    BP 108/68   Pulse 93   Temp 97.7 F (36.5 C)   Ht _10  (1.676 m)   Wt 215 lb (97.5 kg)   SpO2 95%   BMI 34.70 kg/m  BP Readings from Last 3 Encounters:  07/18/21 108/68  03/20/21 120/72  11/20/20 114/72   Wt Readings from Last 3 Encounters:  07/18/21 215 lb (97.5 kg)  03/20/21 207 lb (93.9 kg)  11/20/20 212 lb (96.2 kg)    Physical Exam  General:  Well Developed, well nourished, appropriate for stated age.  Neuro:  Alert and oriented,  extra-ocular muscles intact  HEENT:  Normocephalic, atraumatic, neck supple  Skin:  no gross rash, warm, pink. Cardiac:  RRR, S1 S2 Respiratory: CTA B/L  Vascular:  Ext warm, no cyanosis apprec.; trace of edema b/l Psych:  No HI/SI, judgement and insight good, Euthymic  mood. Full Affect.   No results found for any visits on 07/18/21.  Assessment & Plan      Problem List Items Addressed This Visit  Cardiovascular and Mediastinum   Essential hypertension, benign (Chronic)    -BP elevated on intake, BP repeated with improvement. -Will continue current medication regimen, see med list. -Will continue to monitor.         Endocrine   Subclinical hypothyroidism-with symptoms of fatigue, hair loss etc. (Chronic)    -Discussed improving medication compliance. Will collect TSH. Will continue to monitor.       Relevant Orders   TSH     Other   Anxiety - Primary (Chronic)    -Stable. -Continue current medication regimen. -Will continue to monitor.       Insomnia (Chronic)    -Stable. Discussed limiting/avoiding caffeine. Continue Trazodone 100 mg as needed. Will continue to monitor.       HLD (hyperlipidemia) (Chronic)    -Last lipid panel: HDL 44, LDL 103 -Will repeat lipid panel and hepatic function today. -Continue current medication regimen. If LDL remains elevated recommend increasing Simvastatin to 40 mg. -Will continue to monitor.       Relevant Orders   Lipid Profile   Prediabetes (Chronic)    -Last A1c 6.1, will repeat A1c today. Recommend to reduce sugar intake and monitor carbohydrates. Will continue to monitor.       Relevant Orders   CBC w/Diff   Comp Met (CMET)   HgB A1c   Depression, recurrent (HCC)    -Stable. Continue current medication regimen. Will continue to monitor.       Other Visit Diagnoses     Intertrigo       Relevant Medications   fluticasone (CUTIVATE) 0.05 % cream   ketoconazole (NIZORAL) 2 % cream       Return in about 4 months (around 11/17/2021) for HTN, mood, thyroid.        Lorrene Reid, PA-C  [facility] at [facility] [phone] (phone) [phone] (fax)  [place]

## 2021-07-18 NOTE — Assessment & Plan Note (Signed)
-  BP elevated on intake, BP repeated with improvement. -Will continue current medication regimen, see med list. -Will continue to monitor.

## 2021-07-18 NOTE — Assessment & Plan Note (Signed)
-  Stable. Discussed limiting/avoiding caffeine. Continue Trazodone 100 mg as needed. Will continue to monitor.

## 2021-07-18 NOTE — Assessment & Plan Note (Signed)
-  Discussed improving medication compliance. Will collect TSH. Will continue to monitor.

## 2021-07-19 LAB — COMPREHENSIVE METABOLIC PANEL
ALT: 17 IU/L (ref 0–32)
AST: 18 IU/L (ref 0–40)
Albumin/Globulin Ratio: 1.8 (ref 1.2–2.2)
Albumin: 3.9 g/dL (ref 3.7–4.7)
Alkaline Phosphatase: 81 IU/L (ref 44–121)
BUN/Creatinine Ratio: 18 (ref 12–28)
BUN: 18 mg/dL (ref 8–27)
Bilirubin Total: 0.4 mg/dL (ref 0.0–1.2)
CO2: 25 mmol/L (ref 20–29)
Calcium: 9.3 mg/dL (ref 8.7–10.3)
Chloride: 102 mmol/L (ref 96–106)
Creatinine, Ser: 1.01 mg/dL — ABNORMAL HIGH (ref 0.57–1.00)
Globulin, Total: 2.2 g/dL (ref 1.5–4.5)
Glucose: 108 mg/dL — ABNORMAL HIGH (ref 70–99)
Potassium: 4.5 mmol/L (ref 3.5–5.2)
Sodium: 139 mmol/L (ref 134–144)
Total Protein: 6.1 g/dL (ref 6.0–8.5)
eGFR: 57 mL/min/{1.73_m2} — ABNORMAL LOW (ref >59)

## 2021-07-19 LAB — CBC WITH DIFFERENTIAL/PLATELET
Basophils Absolute: 0.1 10*3/uL (ref 0.0–0.2)
Basos: 1 %
EOS (ABSOLUTE): 0.8 10*3/uL — ABNORMAL HIGH (ref 0.0–0.4)
Eos: 9 %
Hematocrit: 40.9 % (ref 34.0–46.6)
Hemoglobin: 13.5 g/dL (ref 11.1–15.9)
Immature Grans (Abs): 0 10*3/uL (ref 0.0–0.1)
Immature Granulocytes: 0 %
Lymphocytes Absolute: 2.1 10*3/uL (ref 0.7–3.1)
Lymphs: 24 %
MCH: 28.8 pg (ref 26.6–33.0)
MCHC: 33 g/dL (ref 31.5–35.7)
MCV: 87 fL (ref 79–97)
Monocytes Absolute: 0.7 10*3/uL (ref 0.1–0.9)
Monocytes: 8 %
Neutrophils Absolute: 5 10*3/uL (ref 1.4–7.0)
Neutrophils: 58 %
Platelets: 320 10*3/uL (ref 150–450)
RBC: 4.69 x10E6/uL (ref 3.77–5.28)
RDW: 13.1 % (ref 11.7–15.4)
WBC: 8.8 10*3/uL (ref 3.4–10.8)

## 2021-07-19 LAB — TSH: TSH: 5.79 u[IU]/mL — ABNORMAL HIGH (ref 0.450–4.500)

## 2021-07-19 LAB — HEMOGLOBIN A1C
Est. average glucose Bld gHb Est-mCnc: 128 mg/dL
Hgb A1c MFr Bld: 6.1 % — ABNORMAL HIGH (ref 4.8–5.6)

## 2021-07-19 LAB — LIPID PANEL
Chol/HDL Ratio: 3.9 ratio (ref 0.0–4.4)
Cholesterol, Total: 169 mg/dL (ref 100–199)
HDL: 43 mg/dL (ref >39)
LDL Chol Calc (NIH): 98 mg/dL (ref 0–99)
Triglycerides: 163 mg/dL — ABNORMAL HIGH (ref 0–149)
VLDL Cholesterol Cal: 28 mg/dL (ref 5–40)

## 2021-08-13 ENCOUNTER — Other Ambulatory Visit: Payer: Self-pay | Admitting: Physician Assistant

## 2021-08-13 DIAGNOSIS — E782 Mixed hyperlipidemia: Secondary | ICD-10-CM

## 2021-08-28 DIAGNOSIS — K5730 Diverticulosis of large intestine without perforation or abscess without bleeding: Secondary | ICD-10-CM | POA: Diagnosis not present

## 2021-08-28 DIAGNOSIS — Z86010 Personal history of colonic polyps: Secondary | ICD-10-CM | POA: Diagnosis not present

## 2021-08-28 DIAGNOSIS — Z09 Encounter for follow-up examination after completed treatment for conditions other than malignant neoplasm: Secondary | ICD-10-CM | POA: Diagnosis not present

## 2021-09-07 DIAGNOSIS — M25511 Pain in right shoulder: Secondary | ICD-10-CM | POA: Diagnosis not present

## 2021-09-12 ENCOUNTER — Other Ambulatory Visit: Payer: Self-pay | Admitting: Physician Assistant

## 2021-09-12 DIAGNOSIS — F5102 Adjustment insomnia: Secondary | ICD-10-CM

## 2021-10-06 ENCOUNTER — Other Ambulatory Visit: Payer: Self-pay | Admitting: Physician Assistant

## 2021-10-06 DIAGNOSIS — I10 Essential (primary) hypertension: Secondary | ICD-10-CM

## 2021-10-09 ENCOUNTER — Other Ambulatory Visit: Payer: Self-pay | Admitting: Physician Assistant

## 2021-10-09 DIAGNOSIS — L304 Erythema intertrigo: Secondary | ICD-10-CM

## 2021-10-26 ENCOUNTER — Ambulatory Visit (INDEPENDENT_AMBULATORY_CARE_PROVIDER_SITE_OTHER): Payer: PPO | Admitting: Nurse Practitioner

## 2021-10-26 ENCOUNTER — Encounter: Payer: Self-pay | Admitting: Nurse Practitioner

## 2021-10-26 VITALS — BP 112/76 | HR 86 | Ht 66.0 in | Wt 207.1 lb

## 2021-10-26 DIAGNOSIS — M545 Low back pain, unspecified: Secondary | ICD-10-CM

## 2021-10-26 MED ORDER — CELECOXIB 200 MG PO CAPS: 200.0000 mg | ORAL_CAPSULE | Freq: Two times a day (BID) | ORAL | 1 refills | Status: DC | PRN

## 2021-10-26 MED ORDER — KETOROLAC TROMETHAMINE 60 MG/2ML IM SOLN
60.0000 mg | Freq: Once | INTRAMUSCULAR | Status: AC
  Administered 2021-10-26: 60 mg via INTRAMUSCULAR

## 2021-10-26 MED ORDER — METHYLPREDNISOLONE ACETATE 80 MG/ML IJ SUSP
40.0000 mg | Freq: Once | INTRAMUSCULAR | Status: AC
  Administered 2021-10-26: 40 mg via INTRAMUSCULAR

## 2021-10-26 MED ORDER — METHYLPREDNISOLONE 4 MG PO TBPK: ORAL_TABLET | ORAL | 0 refills | Status: DC

## 2021-10-26 NOTE — Patient Instructions (Signed)
Today you were given 2 injections  Toradol 60 mg - strong anti inflammatory and pain reliever Depo Medrol 60 mg which is steroid to reduce swelling.   Two prescriptions sent to the pharmacy  Medrol taper (steroid) - start tomorrow morning. Take per provided instructions  Celebrex 200 mg which can be taken twice daily as needed  You may still take tylenol as needed and as indicated on the bottle instructions.  Call your neurosurgeon first thing Monday morning to get further assessment.

## 2021-10-26 NOTE — Progress Notes (Signed)
Established patient visit   Patient: Shelly Hickman   DOB: 1940/08/22   81 y.o. Female  MRN: [id number] Visit Date: 10/26/2021  Chief Complaint  Patient presents with   Back Pain   Subjective    HPI  The patient is c/o back pain.  -history of threw back surgeries.  -does still see neurosurgeon who recommended physical therapy -did have to take physical therapy at the [facility] Stopped last September due to a fall at homr.  -she states that pain she had in her back yesterday, she states that it felt as bad as when she had ruptured disc in the past -pain is much more severe when she walks and gets worse throughout the day.  -she has taken meloxicam, tylenol. The pain does not go away.   -she has also had significant constipation.  -had to use suppository -has had two bowel movements since use of the suppository. She states that stools were normal.   Medications: Outpatient Medications Prior to Visit  Medication Sig   acetaminophen (TYLENOL) 325 MG tablet Take 2 tablets (650 mg total) by mouth every 6 (six) hours as needed for mild pain (or Fever >/= 101).   amLODipine (NORVASC) 5 MG tablet TAKE 1 TABLET BY MOUTH DAILY   aspirin EC 81 MG tablet Take 81 mg by mouth daily.   Calcium Carb-Cholecalciferol (CALCIUM 600-D PO) Take 1 tablet by mouth daily.   Cholecalciferol (VITAMIN D-3) 5000 units TABS Take 5,000 Units by mouth daily.   diphenhydrAMINE (BENADRYL) 25 mg capsule Take 25 mg by mouth every 6 (six) hours as needed.   DULoxetine (CYMBALTA) 60 MG capsule Take 2 capsules (120 mg total) by mouth daily.   fluticasone (CUTIVATE) 0.05 % cream Apply topically daily.   fluticasone (FLONASE) 50 MCG/ACT nasal spray 1 spray each nostril following sinus rinses twice daily   ketoconazole (NIZORAL) 2 % cream APPLY TOPICALLY DAILY   levothyroxine (SYNTHROID) 25 MCG tablet TAKE 1 TABLET BY MOUTH DAILY   losartan (COZAAR) 100 MG tablet TAKE 1 TABLET BY MOUTH DAILY   niacin (NIASPAN) 1000 MG CR  tablet TAKE 1 TABLET BY MOUTH EACH NIGHT AT BEDTIME   omega-3 acid ethyl esters (LOVAZA) 1 g capsule TAKE 2 CAPSULES BY MOUTH TWICE DAILY   oxybutynin (DITROPAN-XL) 10 MG 24 hr tablet Take 1 tablet (10 mg total) by mouth at bedtime.   simvastatin (ZOCOR) 20 MG tablet TAKE 1 TABLET BY MOUTH EACH NIGHT AT BEDTIME   traZODone (DESYREL) 100 MG tablet TAKE 1 TABLET BY MOUTH DAILY AT BEDTIME FOR SLEEP   UNABLE TO FIND FLUOROURACIL 5% + CALCIPOTRIENE 0.005%   No facility-administered medications prior to visit.    Review of Systems  Constitutional:  Positive for activity change. Negative for appetite change, chills, fatigue and fever.  HENT:  Negative for congestion, postnasal drip, rhinorrhea, sinus pressure, sinus pain, sneezing and sore throat.   Eyes: Negative.   Respiratory:  Negative for cough, chest tightness, shortness of breath and wheezing.   Cardiovascular:  Negative for chest pain and palpitations.  Gastrointestinal:  Positive for constipation. Negative for abdominal pain, diarrhea, nausea and vomiting.  Endocrine: Negative for cold intolerance, heat intolerance, polydipsia and polyuria.  Genitourinary:  Negative for dyspareunia, dysuria, flank pain, frequency and urgency.  Musculoskeletal:  Positive for arthralgias, back pain and myalgias.  Skin:  Negative for rash.  Allergic/Immunologic: Negative for environmental allergies.  Neurological:  Negative for dizziness, weakness and headaches.  Hematological:  Negative for adenopathy.  Psychiatric/Behavioral:  The patient is not nervous/anxious.      Objective     Today's Vitals   10/26/21 0935  BP: 112/76  Pulse: 86  SpO2: 96%  Weight: 207 lb 1.9 oz (93.9 kg)  Height: '5\' 6"'$  (1.676 m)   Body mass index is 33.43 kg/m.   Physical Exam Vitals and nursing note reviewed.  Constitutional:      Appearance: Normal appearance. She is well-developed.  HENT:     Head: Normocephalic.  Eyes:     Pupils: Pupils are equal, round,  and reactive to light.  Cardiovascular:     Rate and Rhythm: Normal rate and regular rhythm.     Pulses: Normal pulses.     Heart sounds: Normal heart sounds.  Pulmonary:     Effort: Pulmonary effort is normal.     Breath sounds: Normal breath sounds.  Abdominal:     General: Bowel sounds are normal. There is no distension.     Palpations: Abdomen is soft. There is no mass.     Tenderness: There is no abdominal tenderness. There is no right CVA tenderness, left CVA tenderness, guarding or rebound.     Hernia: No hernia is present.  Musculoskeletal:        General: Normal range of motion.     Cervical back: Normal range of motion and neck supple.     Comments: Moderate  lower back. Worse with bending and twiting at the waist. Also more severe with sitting for long periods of time. No visible or palpable abnormalities   Lymphadenopathy:     Cervical: No cervical adenopathy.  Skin:    General: Skin is warm and dry.     Capillary Refill: Capillary refill takes less than 2 seconds.  Neurological:     General: No focal deficit present.     Mental Status: She is alert and oriented to person, place, and time.  Psychiatric:        Mood and Affect: Mood normal.        Behavior: Behavior normal.        Thought Content: Thought content normal.        Judgment: Judgment normal.       Assessment & Plan     1. Acute midline low back pain without sciatica Toradol 60 mg and solu-medrol 40 mg injections given IM during today's visit. Will follow up with medrol taper tomorrow. Take as directed for 6 days. Add celebrex 200 mg up to twice daily as needed for pain/inflammation. Recommend she do stretches daily/twice daily. Apply heating pad to effected area in 20 minute increments as needed.  - celecoxib (CELEBREX) 200 MG capsule; Take 1 capsule (200 mg total) by mouth 2 (two) times daily as needed.  Dispense: 60 capsule; Refill: 1 - methylPREDNISolone (MEDROL) 4 MG TBPK tablet; Take by mouth as  directed for 6 days  Dispense: 21 tablet; Refill: 0 - ketorolac (TORADOL) injection 60 mg - methylPREDNISolone acetate (DEPO-MEDROL) injection 40 mg   Return for prn worsening or persistent symptoms.        Ronnell Freshwater, NP  [facility] at [facility] [phone] (phone) [phone] (fax)  [place]

## 2021-11-02 DIAGNOSIS — L821 Other seborrheic keratosis: Secondary | ICD-10-CM | POA: Diagnosis not present

## 2021-11-02 DIAGNOSIS — L304 Erythema intertrigo: Secondary | ICD-10-CM | POA: Diagnosis not present

## 2021-11-02 DIAGNOSIS — L309 Dermatitis, unspecified: Secondary | ICD-10-CM | POA: Diagnosis not present

## 2021-11-02 DIAGNOSIS — B078 Other viral warts: Secondary | ICD-10-CM | POA: Diagnosis not present

## 2021-11-02 DIAGNOSIS — L820 Inflamed seborrheic keratosis: Secondary | ICD-10-CM | POA: Diagnosis not present

## 2021-11-02 DIAGNOSIS — D1801 Hemangioma of skin and subcutaneous tissue: Secondary | ICD-10-CM | POA: Diagnosis not present

## 2021-11-02 DIAGNOSIS — Z85828 Personal history of other malignant neoplasm of skin: Secondary | ICD-10-CM | POA: Diagnosis not present

## 2021-11-02 DIAGNOSIS — D485 Neoplasm of uncertain behavior of skin: Secondary | ICD-10-CM | POA: Diagnosis not present

## 2021-11-19 ENCOUNTER — Other Ambulatory Visit: Payer: Self-pay | Admitting: Physician Assistant

## 2021-11-19 DIAGNOSIS — E782 Mixed hyperlipidemia: Secondary | ICD-10-CM

## 2021-11-19 MED ORDER — SIMVASTATIN 20 MG PO TABS: ORAL_TABLET | ORAL | 0 refills | Status: DC

## 2021-12-07 ENCOUNTER — Other Ambulatory Visit: Payer: Self-pay | Admitting: Physician Assistant

## 2021-12-07 DIAGNOSIS — F5102 Adjustment insomnia: Secondary | ICD-10-CM

## 2021-12-11 DIAGNOSIS — H02831 Dermatochalasis of right upper eyelid: Secondary | ICD-10-CM | POA: Diagnosis not present

## 2021-12-11 DIAGNOSIS — Z961 Presence of intraocular lens: Secondary | ICD-10-CM | POA: Diagnosis not present

## 2021-12-11 DIAGNOSIS — D3132 Benign neoplasm of left choroid: Secondary | ICD-10-CM | POA: Diagnosis not present

## 2021-12-11 DIAGNOSIS — H02834 Dermatochalasis of left upper eyelid: Secondary | ICD-10-CM | POA: Diagnosis not present

## 2021-12-11 DIAGNOSIS — H04123 Dry eye syndrome of bilateral lacrimal glands: Secondary | ICD-10-CM | POA: Diagnosis not present

## 2021-12-24 ENCOUNTER — Other Ambulatory Visit: Payer: Self-pay | Admitting: Nurse Practitioner

## 2021-12-24 DIAGNOSIS — M545 Low back pain, unspecified: Secondary | ICD-10-CM

## 2022-01-16 ENCOUNTER — Other Ambulatory Visit: Payer: Self-pay

## 2022-01-16 DIAGNOSIS — I10 Essential (primary) hypertension: Secondary | ICD-10-CM

## 2022-01-16 MED ORDER — AMLODIPINE BESYLATE 5 MG PO TABS: 5.0000 mg | ORAL_TABLET | Freq: Every day | ORAL | 0 refills | Status: DC

## 2022-01-16 NOTE — Telephone Encounter (Signed)
30 day supply of Amlodipine has been sent to the pharmacy per protocol. Office visit required for further refills.

## 2022-01-22 ENCOUNTER — Telehealth: Payer: Self-pay | Admitting: *Deleted

## 2022-01-22 ENCOUNTER — Other Ambulatory Visit: Payer: Self-pay

## 2022-01-22 DIAGNOSIS — F419 Anxiety disorder, unspecified: Secondary | ICD-10-CM

## 2022-01-22 DIAGNOSIS — I10 Essential (primary) hypertension: Secondary | ICD-10-CM

## 2022-01-22 DIAGNOSIS — F339 Major depressive disorder, recurrent, unspecified: Secondary | ICD-10-CM

## 2022-01-22 MED ORDER — DULOXETINE HCL 60 MG PO CPEP: 120.0000 mg | ORAL_CAPSULE | Freq: Every day | ORAL | 0 refills | Status: DC

## 2022-01-22 MED ORDER — LOSARTAN POTASSIUM 100 MG PO TABS: 100.0000 mg | ORAL_TABLET | Freq: Every day | ORAL | 0 refills | Status: DC

## 2022-01-22 NOTE — Telephone Encounter (Signed)
30 day supply has been sent for both prescriptions to [facility].

## 2022-01-22 NOTE — Telephone Encounter (Signed)
LVM for pt to call office to inform her that her Rx's have been sent to pharmacy. Mitsuru Dault Zimmerman Rumple, CMA

## 2022-01-22 NOTE — Telephone Encounter (Signed)
Pt called requesting a refill on her losartan and Duloxetine.  She said that the pharmacy said they reached out to [place] last Thursday and they have not received anything.  Please send refill to [facility]. Abdulkadir Emmanuel Zimmerman Rumple, CMA

## 2022-01-23 DIAGNOSIS — H903 Sensorineural hearing loss, bilateral: Secondary | ICD-10-CM | POA: Diagnosis not present

## 2022-01-24 ENCOUNTER — Ambulatory Visit (INDEPENDENT_AMBULATORY_CARE_PROVIDER_SITE_OTHER): Payer: PPO | Admitting: Nurse Practitioner

## 2022-01-24 ENCOUNTER — Encounter: Payer: Self-pay | Admitting: Nurse Practitioner

## 2022-01-24 VITALS — BP 110/72 | HR 77 | Ht 66.0 in | Wt 207.8 lb

## 2022-01-24 DIAGNOSIS — M549 Dorsalgia, unspecified: Secondary | ICD-10-CM

## 2022-01-24 DIAGNOSIS — F5101 Primary insomnia: Secondary | ICD-10-CM | POA: Diagnosis not present

## 2022-01-24 MED ORDER — TEMAZEPAM 15 MG PO CAPS: 15.0000 mg | ORAL_CAPSULE | Freq: Every evening | ORAL | 1 refills | Status: DC | PRN

## 2022-01-24 MED ORDER — KETOROLAC TROMETHAMINE 30 MG/ML IJ SOLN
30.0000 mg | Freq: Once | INTRAMUSCULAR | Status: AC
  Administered 2022-01-24: 30 mg via INTRAVENOUS

## 2022-01-24 MED ORDER — METHYLPREDNISOLONE SODIUM SUCC 40 MG IJ SOLR
40.0000 mg | Freq: Once | INTRAMUSCULAR | Status: AC
  Administered 2022-01-24: 40 mg via INTRAMUSCULAR

## 2022-01-24 NOTE — Progress Notes (Signed)
Established patient visit   Patient: Shelly Hickman   DOB: 1940-12-03   81 y.o. Female  MRN: [id number] Visit Date: 01/24/2022  Chief Complaint  Patient presents with   Back Pain   Subjective    Initial bak pain started in September  -treated with steroid ijection and toradol during visit  -got celebrex  -did not take prednisone. Made her irritable in the past.  --initially, she states that the celebbrex, but now, not helping so much.  -having leg cramps. Severe the last 2 nights.  --increased water intake.  -having difficulty sleeping.  -husband passed away on 2021-12-16 -continues to deal with papers and legal work relating to her husband's death.  -she has been cleaning and overdoing things physically since her husband's death and she knows that is not helping.  States that her daughter gave her some temazepam 15 mg while visiting.  -really helped her to sleep without waking up.    Back Pain This is a recurrent problem. The current episode started more than 1 month ago. The problem occurs constantly. The pain is present in the lumbar spine and gluteal. The quality of the pain is described as aching and cramping. The pain is severe. The symptoms are aggravated by bending and stress. Associated symptoms include leg pain. Pertinent negatives include no abdominal pain, chest pain, dysuria, fever, headaches or weakness. Risk factors include menopause.     Medications: Outpatient Medications Prior to Visit  Medication Sig   acetaminophen (TYLENOL) 325 MG tablet Take 2 tablets (650 mg total) by mouth every 6 (six) hours as needed for mild pain (or Fever >/= 101).   aspirin EC 81 MG tablet Take 81 mg by mouth daily.   Calcium Carb-Cholecalciferol (CALCIUM 600-D PO) Take 1 tablet by mouth daily.   celecoxib (CELEBREX) 200 MG capsule TAKE 1 CAPSULE BY MOUTH TWICE DAILY AS NEEDED   Cholecalciferol (VITAMIN D-3) 5000 units TABS Take 5,000 Units by mouth daily.   diphenhydrAMINE  (BENADRYL) 25 mg capsule Take 25 mg by mouth every 6 (six) hours as needed.   DULoxetine (CYMBALTA) 60 MG capsule Take 2 capsules (120 mg total) by mouth daily.   fluticasone (FLONASE) 50 MCG/ACT nasal spray 1 spray each nostril following sinus rinses twice daily   ketoconazole (NIZORAL) 2 % cream APPLY TOPICALLY DAILY   levothyroxine (SYNTHROID) 25 MCG tablet TAKE 1 TABLET BY MOUTH DAILY   losartan (COZAAR) 100 MG tablet Take 1 tablet (100 mg total) by mouth daily.   methylPREDNISolone (MEDROL) 4 MG TBPK tablet Take by mouth as directed for 6 days   niacin (NIASPAN) 1000 MG CR tablet TAKE 1 TABLET BY MOUTH EACH NIGHT AT BEDTIME   omega-3 acid ethyl esters (LOVAZA) 1 g capsule TAKE 2 CAPSULES BY MOUTH TWICE DAILY   oxybutynin (DITROPAN-XL) 10 MG 24 hr tablet Take 1 tablet (10 mg total) by mouth at bedtime.   simvastatin (ZOCOR) 20 MG tablet TAKE 1 TABLET BY MOUTH EACH NIGHT AT BEDTIME   traZODone (DESYREL) 100 MG tablet TAKE 1 TABLET BY MOUTH DAILY AT BEDTIME FOR SLEEP   UNABLE TO FIND FLUOROURACIL 5% + CALCIPOTRIENE 0.005%   [DISCONTINUED] amLODipine (NORVASC) 5 MG tablet Take 1 tablet (5 mg total) by mouth daily.   [DISCONTINUED] fluticasone (CUTIVATE) 0.05 % cream Apply topically daily.   No facility-administered medications prior to visit.    Review of Systems  Constitutional:  Positive for fatigue. Negative for activity change, appetite change, chills and fever.  HENT:  Negative  for congestion, postnasal drip, rhinorrhea, sinus pressure, sinus pain, sneezing and sore throat.   Eyes: Negative.   Respiratory:  Negative for cough, chest tightness, shortness of breath and wheezing.   Cardiovascular:  Negative for chest pain and palpitations.  Gastrointestinal:  Negative for abdominal pain, constipation, diarrhea, nausea and vomiting.  Endocrine: Negative for cold intolerance, heat intolerance, polydipsia and polyuria.  Genitourinary:  Negative for dyspareunia, dysuria, flank pain,  frequency and urgency.  Musculoskeletal:  Positive for back pain and myalgias. Negative for arthralgias.       Leg cramps   Skin:  Negative for rash.  Allergic/Immunologic: Negative for environmental allergies.  Neurological:  Negative for dizziness, weakness and headaches.  Hematological:  Negative for adenopathy.  Psychiatric/Behavioral:  Positive for sleep disturbance. The patient is nervous/anxious.      Objective     Today's Vitals   01/24/22 1623  BP: 110/72  Pulse: 77  SpO2: 96%  Weight: 207 lb 12.8 oz (94.3 kg)  Height: '5\' 6"'$  (1.676 m)   Body mass index is 33.54 kg/m.   Physical Exam Vitals and nursing note reviewed.  Constitutional:      Appearance: Normal appearance. She is well-developed.  HENT:     Head: Normocephalic and atraumatic.  Eyes:     Pupils: Pupils are equal, round, and reactive to light.  Cardiovascular:     Rate and Rhythm: Normal rate and regular rhythm.     Pulses: Normal pulses.     Heart sounds: Normal heart sounds.  Pulmonary:     Effort: Pulmonary effort is normal.     Breath sounds: Normal breath sounds.  Abdominal:     Palpations: Abdomen is soft.  Musculoskeletal:        General: Normal range of motion.     Cervical back: Normal range of motion and neck supple.     Comments: Moderate back pain, especially when bending and twisting at the waist. No bony abnormalities or deformities noted.   Lymphadenopathy:     Cervical: No cervical adenopathy.  Skin:    General: Skin is warm and dry.     Capillary Refill: Capillary refill takes less than 2 seconds.  Neurological:     General: No focal deficit present.     Mental Status: She is alert and oriented to person, place, and time.  Psychiatric:        Mood and Affect: Mood normal.        Behavior: Behavior normal.        Thought Content: Thought content normal.        Judgment: Judgment normal.       Assessment & Plan     1. Back pain, unspecified back location, unspecified  back pain laterality, unspecified chronicity Injection toradol 30 mg and solumedrol 40 mg given IM during visit. Patient monitored closely for 20 minutes after injections with no negative reactions. She tolerated these well. Continue home medications for pain relief as previously prescribed. Reassess in 6 months.  - ketorolac (TORADOL) 30 MG/ML injection 30 mg - methylPREDNISolone sodium succinate (SOLU-MEDROL) 40 mg/mL injection 40 mg  2. Primary insomnia Trial temazepam 15 mg at bedtime as needed. Reassess in 6 weeks.  - temazepam (RESTORIL) 15 MG capsule; Take 1 capsule (15 mg total) by mouth at bedtime as needed for sleep.  Dispense: 30 capsule; Refill: 1   Return in about 5 weeks (around 02/28/2022) for mood, check HgbA1c.        Ronnell Freshwater, NP  [place] at [facility] [phone] (phone) [phone] (fax)  [place]

## 2022-02-04 ENCOUNTER — Telehealth: Payer: Self-pay | Admitting: *Deleted

## 2022-02-04 ENCOUNTER — Other Ambulatory Visit: Payer: Self-pay | Admitting: Nurse Practitioner

## 2022-02-04 DIAGNOSIS — I10 Essential (primary) hypertension: Secondary | ICD-10-CM

## 2022-02-04 NOTE — Telephone Encounter (Signed)
Pt calls wanting to have refills on her losartan and amlodipine.  She is leaving to go out of town for about 3 weeks and she will need to have enough to get her through that time. Please refill accordingly.  Aleka Twitty Zimmerman Rumple, CMA

## 2022-02-05 NOTE — Telephone Encounter (Signed)
These Rx' s were refill on the 01/22/2022 and 02/04/2022

## 2022-02-11 ENCOUNTER — Other Ambulatory Visit: Payer: Self-pay | Admitting: Nurse Practitioner

## 2022-02-11 DIAGNOSIS — M549 Dorsalgia, unspecified: Secondary | ICD-10-CM | POA: Insufficient documentation

## 2022-02-11 DIAGNOSIS — I10 Essential (primary) hypertension: Secondary | ICD-10-CM

## 2022-02-22 ENCOUNTER — Other Ambulatory Visit: Payer: Self-pay | Admitting: Nurse Practitioner

## 2022-02-22 DIAGNOSIS — F419 Anxiety disorder, unspecified: Secondary | ICD-10-CM

## 2022-02-22 DIAGNOSIS — F339 Major depressive disorder, recurrent, unspecified: Secondary | ICD-10-CM

## 2022-02-28 NOTE — Progress Notes (Signed)
Established patient visit   Patient: Shelly Hickman   DOB: [birth year]   82 y.o. Female  MRN: [id number] Visit Date: 03/01/2022   Chief Complaint  Patient presents with   Follow-up   Diabetes   Subjective    HPI  Follow up  -prediabetes  --due to have check of HgbA1c which is 6.1 today  -anxiety/depression  -would like to wean herself off of her cymbalta.  --feels like she is doing well. Feels happy. Feels as though she is going to be "okay."  -added temazepam to help with sleep  -taking cymbalta 60 mg twice daily  -grieving the loss of her husband who passed in 11/2021  Hypertension  -typically well controlled  -chronic low back pain  --would like to restart physical therapy   Medications: Outpatient Medications Prior to Visit  Medication Sig   acetaminophen (TYLENOL) 325 MG tablet Take 2 tablets (650 mg total) by mouth every 6 (six) hours as needed for mild pain (or Fever >/= 101).   aspirin EC 81 MG tablet Take 81 mg by mouth daily.   Calcium Carb-Cholecalciferol (CALCIUM 600-D PO) Take 1 tablet by mouth daily.   celecoxib (CELEBREX) 200 MG capsule TAKE 1 CAPSULE BY MOUTH TWICE DAILY AS NEEDED   Cholecalciferol (VITAMIN D-3) 5000 units TABS Take 5,000 Units by mouth daily.   diphenhydrAMINE (BENADRYL) 25 mg capsule Take 25 mg by mouth every 6 (six) hours as needed.   fluticasone (FLONASE) 50 MCG/ACT nasal spray 1 spray each nostril following sinus rinses twice daily   ketoconazole (NIZORAL) 2 % cream APPLY TOPICALLY DAILY   levothyroxine (SYNTHROID) 25 MCG tablet TAKE 1 TABLET BY MOUTH DAILY   methylPREDNISolone (MEDROL) 4 MG TBPK tablet Take by mouth as directed for 6 days   oxybutynin (DITROPAN-XL) 10 MG 24 hr tablet Take 1 tablet (10 mg total) by mouth at bedtime.   temazepam (RESTORIL) 15 MG capsule Take 1 capsule (15 mg total) by mouth at bedtime as needed for sleep.   UNABLE TO FIND FLUOROURACIL 5% + CALCIPOTRIENE 0.005%   [DISCONTINUED] amLODipine (NORVASC) 5 MG  tablet TAKE 1 TABLET BY MOUTH DAILY   [DISCONTINUED] DULoxetine (CYMBALTA) 60 MG capsule TAKE 2 CAPSULES BY MOUTH DAILY   [DISCONTINUED] losartan (COZAAR) 100 MG tablet TAKE 1 TABLET BY MOUTH DAILY   [DISCONTINUED] niacin (NIASPAN) 1000 MG CR tablet TAKE 1 TABLET BY MOUTH EACH NIGHT AT BEDTIME   [DISCONTINUED] omega-3 acid ethyl esters (LOVAZA) 1 g capsule TAKE 2 CAPSULES BY MOUTH TWICE DAILY   [DISCONTINUED] simvastatin (ZOCOR) 20 MG tablet TAKE 1 TABLET BY MOUTH EACH NIGHT AT BEDTIME   [DISCONTINUED] traZODone (DESYREL) 100 MG tablet TAKE 1 TABLET BY MOUTH DAILY AT BEDTIME FOR SLEEP   No facility-administered medications prior to visit.    Review of Systems  Constitutional:  Positive for fatigue. Negative for activity change, appetite change, chills and fever.  HENT:  Negative for congestion, postnasal drip, rhinorrhea, sinus pressure, sinus pain, sneezing and sore throat.   Eyes: Negative.   Respiratory:  Negative for cough, chest tightness, shortness of breath and wheezing.   Cardiovascular:  Negative for chest pain and palpitations.  Gastrointestinal:  Negative for abdominal pain, constipation, diarrhea, nausea and vomiting.  Endocrine: Negative for cold intolerance, heat intolerance, polydipsia and polyuria.       Blood sugars doing well    Genitourinary:  Negative for dyspareunia, dysuria, flank pain, frequency and urgency.  Musculoskeletal:  Negative for arthralgias, back pain and myalgias.  Skin:  Negative for rash.  Allergic/Immunologic: Negative for environmental allergies.  Neurological:  Negative for dizziness, weakness and headaches.  Hematological:  Negative for adenopathy.  Psychiatric/Behavioral:  The patient is nervous/anxious.        Improved over last several months      Last CBC Lab Results  Component Value Date   WBC 8.8 07/18/2021   HGB 13.5 07/18/2021   HCT 40.9 07/18/2021   MCV 87 07/18/2021   MCH 28.8 07/18/2021   RDW 13.1 07/18/2021   PLT 320  [id number]   Last metabolic panel Lab Results  Component Value Date   GLUCOSE 108 (H) 07/18/2021   NA 139 07/18/2021   K 4.5 07/18/2021   CL 102 07/18/2021   CO2 25 07/18/2021   BUN 18 07/18/2021   CREATININE 1.01 (H) 07/18/2021   EGFR 57 (L) 07/18/2021   CALCIUM 9.3 07/18/2021   PROT 6.1 07/18/2021   ALBUMIN 3.9 07/18/2021   LABGLOB 2.2 07/18/2021   AGRATIO 1.8 07/18/2021   BILITOT 0.4 07/18/2021   ALKPHOS 81 07/18/2021   AST 18 07/18/2021   ALT 17 07/18/2021   ANIONGAP 7 11/15/2020   Last lipids Lab Results  Component Value Date   CHOL 169 07/18/2021   HDL 43 07/18/2021   LDLCALC 98 07/18/2021   TRIG 163 (H) 07/18/2021   CHOLHDL 3.9 07/18/2021   Last hemoglobin A1c Lab Results  Component Value Date   HGBA1C 6.1 03/01/2022   Last thyroid functions Lab Results  Component Value Date   TSH 4.290 03/01/2022   Last vitamin D Lab Results  Component Value Date   VD25OH 36.9 11/13/2020       Objective     Today's Vitals   03/01/22 0949  BP: 117/74  Pulse: 78  SpO2: 97%  Weight: 204 lb 6.4 oz (92.7 kg)  Height: 5' 6"$  (1.676 m)   Body mass index is 32.99 kg/m.  BP Readings from Last 3 Encounters:  03/01/22 117/74  01/24/22 110/72  10/26/21 112/76    Wt Readings from Last 3 Encounters:  03/01/22 204 lb 6.4 oz (92.7 kg)  01/24/22 207 lb 12.8 oz (94.3 kg)  10/26/21 207 lb 1.9 oz (93.9 kg)    Physical Exam Vitals and nursing note reviewed.  Constitutional:      Appearance: Normal appearance. She is well-developed.  HENT:     Head: Normocephalic and atraumatic.     Nose: Nose normal.     Mouth/Throat:     Mouth: Mucous membranes are moist.     Pharynx: Oropharynx is clear.  Eyes:     Extraocular Movements: Extraocular movements intact.     Conjunctiva/sclera: Conjunctivae normal.     Pupils: Pupils are equal, round, and reactive to light.  Cardiovascular:     Rate and Rhythm: Normal rate and regular rhythm.     Pulses: Normal pulses.      Heart sounds: Normal heart sounds.  Pulmonary:     Effort: Pulmonary effort is normal.     Breath sounds: Normal breath sounds.  Abdominal:     Palpations: Abdomen is soft.  Musculoskeletal:        General: Normal range of motion.     Cervical back: Normal range of motion and neck supple.  Lymphadenopathy:     Cervical: No cervical adenopathy.  Skin:    General: Skin is warm and dry.     Capillary Refill: Capillary refill takes less than 2 seconds.  Neurological:     General: No  focal deficit present.     Mental Status: She is alert and oriented to person, place, and time.  Psychiatric:        Mood and Affect: Mood normal.        Behavior: Behavior normal.        Thought Content: Thought content normal.        Judgment: Judgment normal.     Results for orders placed or performed in visit on 03/01/22  TSH + free T4  Result Value Ref Range   TSH 4.290 0.450 - 4.500 uIU/mL   Free T4 1.01 0.82 - 1.77 ng/dL  POCT glycosylated hemoglobin (Hb A1C)  Result Value Ref Range   Hemoglobin A1C     HbA1c POC (<> result, manual entry) 6.1 4.0 - 5.6 %   HbA1c, POC (prediabetic range)     HbA1c, POC (controlled diabetic range)      Assessment & Plan    1. Prediabetes HgbA1c 6.1 today. Continue to control with diet and exercise. Recheck at next visit.   2. Mixed hyperlipidemia Continue simvastatin and lovaza as prescribed. Refills to be provided as needed  3. Acquired hypothyroidism Check thyroid panel today and adjust dose of synthroid as indicated.  - TSH + free T4  4. Back pain, unspecified back location, unspecified back pain laterality, unspecified chronicity Back pain improved but persistent. Refer for physical therapy.  - Ambulatory referral to Physical Therapy  5. Depression, recurrent ([place]) Recommend slow wean from cymbalta. Reviewed instructions for weaning process. She voiced understanding of all instructions.   6. Essential hypertension, benign Stable. Continue bp  medication as prescribed.     Problem List Items Addressed This Visit       Cardiovascular and Mediastinum   Essential hypertension, benign (Chronic)     Endocrine   Acquired hypothyroidism   Relevant Orders   TSH + free T4 (Completed)     Other   HLD (hyperlipidemia) (Chronic)   Relevant Orders   POCT glycosylated hemoglobin (Hb A1C) (Completed)   Prediabetes - Primary (Chronic)   Depression, recurrent (HCC)   Back pain   Relevant Orders   Ambulatory referral to Physical Therapy     Return in about 3 months (around 05/31/2022) for medicare wellness, FBW a week prior to visit.         Ronnell Freshwater, NP  [facility] [phone] (phone) [phone] (fax)  [place]

## 2022-03-01 ENCOUNTER — Other Ambulatory Visit: Payer: Self-pay

## 2022-03-01 ENCOUNTER — Ambulatory Visit (INDEPENDENT_AMBULATORY_CARE_PROVIDER_SITE_OTHER): Payer: PPO | Admitting: Nurse Practitioner

## 2022-03-01 ENCOUNTER — Encounter: Payer: Self-pay | Admitting: Nurse Practitioner

## 2022-03-01 VITALS — BP 117/74 | HR 78 | Ht 66.0 in | Wt 204.4 lb

## 2022-03-01 DIAGNOSIS — F5102 Adjustment insomnia: Secondary | ICD-10-CM

## 2022-03-01 DIAGNOSIS — M549 Dorsalgia, unspecified: Secondary | ICD-10-CM | POA: Diagnosis not present

## 2022-03-01 DIAGNOSIS — R7303 Prediabetes: Secondary | ICD-10-CM

## 2022-03-01 DIAGNOSIS — E782 Mixed hyperlipidemia: Secondary | ICD-10-CM

## 2022-03-01 DIAGNOSIS — I10 Essential (primary) hypertension: Secondary | ICD-10-CM

## 2022-03-01 DIAGNOSIS — E039 Hypothyroidism, unspecified: Secondary | ICD-10-CM

## 2022-03-01 DIAGNOSIS — F339 Major depressive disorder, recurrent, unspecified: Secondary | ICD-10-CM | POA: Diagnosis not present

## 2022-03-01 DIAGNOSIS — E785 Hyperlipidemia, unspecified: Secondary | ICD-10-CM

## 2022-03-01 LAB — POCT GLYCOSYLATED HEMOGLOBIN (HGB A1C): HbA1c POC (<> result, manual entry): 6.1 % (ref 4.0–5.6)

## 2022-03-01 MED ORDER — TRAZODONE HCL 100 MG PO TABS: ORAL_TABLET | ORAL | 0 refills | Status: DC

## 2022-03-01 MED ORDER — SIMVASTATIN 20 MG PO TABS: ORAL_TABLET | ORAL | 0 refills | Status: DC

## 2022-03-02 LAB — TSH+FREE T4
Free T4: 1.01 ng/dL (ref 0.82–1.77)
TSH: 4.29 u[IU]/mL (ref 0.450–4.500)

## 2022-03-19 ENCOUNTER — Telehealth: Payer: Self-pay | Admitting: *Deleted

## 2022-03-19 ENCOUNTER — Other Ambulatory Visit: Payer: Self-pay | Admitting: Nurse Practitioner

## 2022-03-19 DIAGNOSIS — E785 Hyperlipidemia, unspecified: Secondary | ICD-10-CM

## 2022-03-19 DIAGNOSIS — E781 Pure hyperglyceridemia: Secondary | ICD-10-CM

## 2022-03-19 DIAGNOSIS — E782 Mixed hyperlipidemia: Secondary | ICD-10-CM

## 2022-03-19 DIAGNOSIS — I10 Essential (primary) hypertension: Secondary | ICD-10-CM

## 2022-03-19 MED ORDER — NIACIN ER (ANTIHYPERLIPIDEMIC) 1000 MG PO TBCR: EXTENDED_RELEASE_TABLET | ORAL | 1 refills | Status: DC

## 2022-03-19 MED ORDER — OMEGA-3-ACID ETHYL ESTERS 1 G PO CAPS: 2.0000 | ORAL_CAPSULE | Freq: Two times a day (BID) | ORAL | 1 refills | Status: DC

## 2022-03-19 NOTE — Telephone Encounter (Signed)
Pt calling stating that prescriptions sent today need to go to [facility].  They went to a pharmacy in [place].  Also she is requesting refills on the below medications. Informed her that it has been a while since she has been on these.  Told her I would send a message to see if provider would fill. .  She had an appointment on  03/01/22 with provider and forgot to ask about these. Next appointment is in [place].   These 2 need to be sent to the pharmacy below.    amLODipine (NORVASC) 5 MG tablet   losartan (COZAAR) 100 MG tablet    niacin (NIASPAN) 1000 MG CR tablet    omega-3 acid ethyl esters (LOVAZA) 1 g capsule   [facility] - [place], [place]

## 2022-03-19 NOTE — Telephone Encounter (Signed)
Please resend the losartan and amlodipine to the [facility].  They were sent to a [employer] previously. Salem Lembke Zimmerman Rumple, CMA

## 2022-03-19 NOTE — Telephone Encounter (Signed)
I did the niacin and omega 3. Looks like the other 2 were already done

## 2022-03-19 NOTE — Progress Notes (Signed)
Normal

## 2022-03-20 ENCOUNTER — Other Ambulatory Visit: Payer: Self-pay

## 2022-03-20 DIAGNOSIS — I10 Essential (primary) hypertension: Secondary | ICD-10-CM

## 2022-03-20 MED ORDER — AMLODIPINE BESYLATE 5 MG PO TABS: 5.0000 mg | ORAL_TABLET | Freq: Every day | ORAL | 1 refills | Status: DC

## 2022-03-20 MED ORDER — LOSARTAN POTASSIUM 100 MG PO TABS: 100.0000 mg | ORAL_TABLET | Freq: Every day | ORAL | 1 refills | Status: DC

## 2022-03-20 NOTE — Telephone Encounter (Signed)
Pt calling about the 2 Rx's that needed to be sent to local pharmacy.  They were sent to a [facility] previously.  Please send the below medication to the below pharmacy.    Losartan  Amlodipine  [facility].

## 2022-03-20 NOTE — Telephone Encounter (Signed)
Rx's were sent to [facility] 03/20/2022

## 2022-03-22 ENCOUNTER — Telehealth: Payer: Self-pay | Admitting: Nurse Practitioner

## 2022-03-22 DIAGNOSIS — F339 Major depressive disorder, recurrent, unspecified: Secondary | ICD-10-CM

## 2022-03-22 DIAGNOSIS — F419 Anxiety disorder, unspecified: Secondary | ICD-10-CM

## 2022-03-22 NOTE — Telephone Encounter (Signed)
LVM for pt to call office back to inform her of below.Cristopher Ciccarelli Zimmerman Rumple, CMA

## 2022-03-27 ENCOUNTER — Other Ambulatory Visit: Payer: Self-pay

## 2022-03-27 DIAGNOSIS — F339 Major depressive disorder, recurrent, unspecified: Secondary | ICD-10-CM

## 2022-03-27 DIAGNOSIS — F419 Anxiety disorder, unspecified: Secondary | ICD-10-CM

## 2022-03-27 MED ORDER — DULOXETINE HCL 60 MG PO CPEP: 120.0000 mg | ORAL_CAPSULE | Freq: Every day | ORAL | 1 refills | Status: DC

## 2022-03-27 NOTE — Telephone Encounter (Signed)
Pt is no longer in [place] and wanting this medication sent to  [place], [place]

## 2022-03-27 NOTE — Telephone Encounter (Signed)
Rx has been sent to [facility]

## 2022-04-05 DIAGNOSIS — E039 Hypothyroidism, unspecified: Secondary | ICD-10-CM | POA: Insufficient documentation

## 2022-04-24 DIAGNOSIS — R350 Frequency of micturition: Secondary | ICD-10-CM | POA: Diagnosis not present

## 2022-04-24 DIAGNOSIS — N3941 Urge incontinence: Secondary | ICD-10-CM | POA: Diagnosis not present

## 2022-05-17 DIAGNOSIS — Z1231 Encounter for screening mammogram for malignant neoplasm of breast: Secondary | ICD-10-CM | POA: Diagnosis not present

## 2022-05-17 LAB — HM MAMMOGRAPHY

## 2022-05-20 ENCOUNTER — Other Ambulatory Visit: Payer: Self-pay | Admitting: Nurse Practitioner

## 2022-05-20 DIAGNOSIS — F5102 Adjustment insomnia: Secondary | ICD-10-CM

## 2022-05-21 ENCOUNTER — Encounter: Payer: Self-pay | Admitting: Family Medicine

## 2022-05-22 DIAGNOSIS — H903 Sensorineural hearing loss, bilateral: Secondary | ICD-10-CM | POA: Diagnosis not present

## 2022-05-23 ENCOUNTER — Telehealth: Payer: Self-pay | Admitting: *Deleted

## 2022-05-23 NOTE — Telephone Encounter (Signed)
Pt calling to inquire about her trazadone Rx, she said pharmacy has reached out to [place] for this and they have not heard back from [place]. It  shows still pending.  Please advise.   traZODone (DESYREL) 100 MG tablet   [facility], [place]     LOV 03/01/22 ROV 05/31/22

## 2022-05-23 NOTE — Telephone Encounter (Signed)
Refill sent.

## 2022-05-24 ENCOUNTER — Other Ambulatory Visit: Payer: PPO

## 2022-05-24 DIAGNOSIS — Z0000 Encounter for general adult medical examination without abnormal findings: Secondary | ICD-10-CM

## 2022-05-24 DIAGNOSIS — E782 Mixed hyperlipidemia: Secondary | ICD-10-CM

## 2022-05-24 DIAGNOSIS — Z130 Encounter for screening for diseases of the blood and blood-forming organs and certain disorders involving the immune mechanism: Secondary | ICD-10-CM

## 2022-05-24 DIAGNOSIS — R7303 Prediabetes: Secondary | ICD-10-CM

## 2022-05-27 ENCOUNTER — Other Ambulatory Visit: Payer: PPO

## 2022-05-27 DIAGNOSIS — Z1329 Encounter for screening for other suspected endocrine disorder: Secondary | ICD-10-CM | POA: Diagnosis not present

## 2022-05-27 DIAGNOSIS — E782 Mixed hyperlipidemia: Secondary | ICD-10-CM | POA: Diagnosis not present

## 2022-05-27 DIAGNOSIS — Z0000 Encounter for general adult medical examination without abnormal findings: Secondary | ICD-10-CM | POA: Diagnosis not present

## 2022-05-27 DIAGNOSIS — Z130 Encounter for screening for diseases of the blood and blood-forming organs and certain disorders involving the immune mechanism: Secondary | ICD-10-CM | POA: Diagnosis not present

## 2022-05-27 DIAGNOSIS — R7303 Prediabetes: Secondary | ICD-10-CM | POA: Diagnosis not present

## 2022-05-27 DIAGNOSIS — Z13228 Encounter for screening for other metabolic disorders: Secondary | ICD-10-CM | POA: Diagnosis not present

## 2022-05-27 DIAGNOSIS — Z1321 Encounter for screening for nutritional disorder: Secondary | ICD-10-CM | POA: Diagnosis not present

## 2022-05-28 ENCOUNTER — Other Ambulatory Visit: Payer: Self-pay | Admitting: Nurse Practitioner

## 2022-05-28 DIAGNOSIS — F339 Major depressive disorder, recurrent, unspecified: Secondary | ICD-10-CM

## 2022-05-28 DIAGNOSIS — F419 Anxiety disorder, unspecified: Secondary | ICD-10-CM

## 2022-05-28 LAB — LIPID PANEL
Chol/HDL Ratio: 3.6 ratio (ref 0.0–4.4)
Cholesterol, Total: 174 mg/dL (ref 100–199)
HDL: 48 mg/dL (ref >39)
LDL Chol Calc (NIH): 104 mg/dL — ABNORMAL HIGH (ref 0–99)
Triglycerides: 125 mg/dL (ref 0–149)
VLDL Cholesterol Cal: 22 mg/dL (ref 5–40)

## 2022-05-28 LAB — CBC
Hematocrit: 43.1 % (ref 34.0–46.6)
Hemoglobin: 13.6 g/dL (ref 11.1–15.9)
MCH: 27.9 pg (ref 26.6–33.0)
MCHC: 31.6 g/dL (ref 31.5–35.7)
MCV: 88 fL (ref 79–97)
Platelets: 333 10*3/uL (ref 150–450)
RBC: 4.88 x10E6/uL (ref 3.77–5.28)
RDW: 13.1 % (ref 11.7–15.4)
WBC: 8.1 10*3/uL (ref 3.4–10.8)

## 2022-05-28 LAB — COMPREHENSIVE METABOLIC PANEL
ALT: 24 IU/L (ref 0–32)
AST: 23 IU/L (ref 0–40)
Albumin/Globulin Ratio: 1.8 (ref 1.2–2.2)
Albumin: 4.2 g/dL (ref 3.8–4.8)
Alkaline Phosphatase: 88 IU/L (ref 44–121)
BUN/Creatinine Ratio: 17 (ref 12–28)
BUN: 15 mg/dL (ref 8–27)
Bilirubin Total: 0.6 mg/dL (ref 0.0–1.2)
CO2: 26 mmol/L (ref 20–29)
Calcium: 9.5 mg/dL (ref 8.7–10.3)
Chloride: 97 mmol/L (ref 96–106)
Creatinine, Ser: 0.86 mg/dL (ref 0.57–1.00)
Globulin, Total: 2.4 g/dL (ref 1.5–4.5)
Glucose: 121 mg/dL — ABNORMAL HIGH (ref 70–99)
Potassium: 4.4 mmol/L (ref 3.5–5.2)
Sodium: 135 mmol/L (ref 134–144)
Total Protein: 6.6 g/dL (ref 6.0–8.5)
eGFR: 69 mL/min/{1.73_m2} (ref >59)

## 2022-05-28 LAB — HEMOGLOBIN A1C
Est. average glucose Bld gHb Est-mCnc: 123 mg/dL
Hgb A1c MFr Bld: 5.9 % — ABNORMAL HIGH (ref 4.8–5.6)

## 2022-05-28 LAB — TSH: TSH: 5.59 u[IU]/mL — ABNORMAL HIGH (ref 0.450–4.500)

## 2022-05-31 ENCOUNTER — Ambulatory Visit (INDEPENDENT_AMBULATORY_CARE_PROVIDER_SITE_OTHER): Payer: PPO | Admitting: Family Medicine

## 2022-05-31 ENCOUNTER — Encounter: Payer: Self-pay | Admitting: Family Medicine

## 2022-05-31 VITALS — BP 121/81 | HR 74 | Resp 18 | Ht 66.0 in | Wt 216.0 lb

## 2022-05-31 DIAGNOSIS — R7303 Prediabetes: Secondary | ICD-10-CM | POA: Diagnosis not present

## 2022-05-31 DIAGNOSIS — I10 Essential (primary) hypertension: Secondary | ICD-10-CM | POA: Diagnosis not present

## 2022-05-31 DIAGNOSIS — Z0000 Encounter for general adult medical examination without abnormal findings: Secondary | ICD-10-CM

## 2022-05-31 DIAGNOSIS — E039 Hypothyroidism, unspecified: Secondary | ICD-10-CM

## 2022-05-31 NOTE — Assessment & Plan Note (Signed)
Last A1c 5.9.  Will continue to monitor.  Encouraged her to get regular exercise and be mindful of carb intake, limiting sugary foods and drinks and simple carbs like white bread, pasta, rice.  We discussed strategies such as staying stopped with snack options like baby carrots or celery that are already prepped and easy to grab.

## 2022-05-31 NOTE — Progress Notes (Signed)
Subjective:   Shelly Hickman is a 82 y.o. female who presents for Medicare Annual (Subsequent) preventive examination.  Review of Systems    Review of Systems  Skin:  Positive for rash.  All other systems reviewed and are negative.   Cardiac Risk Factors include: none     Objective:    Today's Vitals   05/31/22 0951 05/31/22 0954  BP: 121/81   Pulse: 74   Resp: 18   SpO2: 96%   Weight: 216 lb (98 kg)   Height:  (1.676 m)   PainSc: 0-No pain 0-No pain   Body mass index is 34.86 kg/m.     05/31/2022    9:56 AM 09/03/2016   10:46 AM 03/04/2016    7:54 AM 02/22/2016   11:13 AM 08/30/2015    1:14 PM 07/01/2013   12:45 PM 12/10/2012   11:41 AM  Advanced Directives  Does Patient Have a Medical Advance Directive? Yes Yes  Yes Yes Patient has advance directive, copy not in chart Patient has advance directive, copy not in chart  Type of Advance Directive Living will Healthcare Power of [place];Living will  Healthcare Power of [place];Living will Living will;Healthcare Power of Attorney Living will Living will  Does patient want to make changes to medical advance directive? No - Patient declined        Copy of Healthcare Power of Attorney in Chart?   No - copy requested No - copy requested   Copy requested from family  Pre-existing out of facility DNR order (yellow form or pink MOST form)       No    Current Medications (verified) Outpatient Encounter Medications as of 05/31/2022  Medication Sig   acetaminophen (TYLENOL) 325 MG tablet Take 2 tablets (650 mg total) by mouth every 6 (six) hours as needed for mild pain (or Fever >/= 101).   amLODipine (NORVASC) 5 MG tablet Take 1 tablet (5 mg total) by mouth daily.   aspirin EC 81 MG tablet Take 81 mg by mouth daily.   Calcium Carb-Cholecalciferol (CALCIUM 600-D PO) Take 1 tablet by mouth daily.   celecoxib (CELEBREX) 200 MG capsule TAKE 1 CAPSULE BY MOUTH TWICE DAILY AS NEEDED   Cholecalciferol (VITAMIN D-3) 5000 units  TABS Take 5,000 Units by mouth daily.   diphenhydrAMINE (BENADRYL) 25 mg capsule Take 25 mg by mouth every 6 (six) hours as needed.   DULoxetine (CYMBALTA) 60 MG capsule TAKE TWO CAPSULES BY MOUTH DAILY   fluticasone (FLONASE) 50 MCG/ACT nasal spray 1 spray each nostril following sinus rinses twice daily   ketoconazole (NIZORAL) 2 % cream APPLY TOPICALLY DAILY   levothyroxine (SYNTHROID) 25 MCG tablet TAKE 1 TABLET BY MOUTH DAILY   losartan (COZAAR) 100 MG tablet Take 1 tablet (100 mg total) by mouth daily.   niacin (NIASPAN) 1000 MG CR tablet TAKE 1 TABLET BY MOUTH EACH NIGHT AT BEDTIME   omega-3 acid ethyl esters (LOVAZA) 1 g capsule Take 2 capsules (2 g total) by mouth 2 (two) times daily.   oxybutynin (DITROPAN-XL) 10 MG 24 hr tablet Take 1 tablet (10 mg total) by mouth at bedtime.   simvastatin (ZOCOR) 20 MG tablet TAKE 1 TABLET BY MOUTH EACH NIGHT AT BEDTIME   traZODone (DESYREL) 100 MG tablet TAKE 1 TABLET BY MOUTH AT BEDTIME FOR SLEEP   UNABLE TO FIND FLUOROURACIL 5% + CALCIPOTRIENE 0.005%   [DISCONTINUED] methylPREDNISolone (MEDROL) 4 MG TBPK tablet Take by mouth as directed for 6 days   [  DISCONTINUED] temazepam (RESTORIL) 15 MG capsule Take 1 capsule (15 mg total) by mouth at bedtime as needed for sleep. (Patient not taking: Reported on 05/31/2022)   No facility-administered encounter medications on file as of 05/31/2022.    Allergies (verified) Avelox [moxifloxacin hcl in nacl], Cephalosporins, Bee venom, and Other   History: Past Medical History:  Diagnosis Date   Anemia    Anxiety    Arthritis    Depression    H/O hiatal hernia    Hypercholesterolemia    Hypertension    Incontinence of urine    Neuromuscular disorder    Osteoarthritis    Past Surgical History:  Procedure Laterality Date   2 Synovial Cysts removed between L4-L5     ABDOMINAL HYSTERECTOMY     BACK SURGERY     back fusion after 2 back surgeries    CARPAL TUNNEL RELEASE     COLONOSCOPY     EYE  SURGERY     tube inserted for excessive tearing , tube is removed    Right Knee Arthroscopy     SALPINGOOPHORECTOMY  1993   TONSILLECTOMY     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   Procedure: TOTAL HIP ARTHROPLASTY;  Surgeon: Nestor Lewandowsky, MD;  Location: [facility] OR;  Service: Orthopedics;  Laterality: Left;   TOTAL KNEE ARTHROPLASTY Right 03/04/2016   Procedure: TOTAL KNEE ARTHROPLASTY;  Surgeon: Salvatore Marvel, MD;  [facility];  Service: Orthopedics;  Laterality: Right;   WISDOM TOOTH EXTRACTION     Family History  Problem Relation Age of Onset   Hypertension Mother    Diabetes Mother    Stroke Mother    Alcoholism Father    Alcohol abuse Father    Depression Sister    Social History   Socioeconomic History   Marital status: Married    Spouse name: Not on file   Number of children: Not on file   Years of education: Not on file   Highest education level: Not on file  Occupational History   Not on file  Tobacco Use   Smoking status: Former    Packs/day: .5    Types: Cigarettes    Start date: 02/19/1960    Quit date: 02/18/1965    Years since quitting: 57.3    Passive exposure: Never   Smokeless tobacco: Never  Vaping Use   Vaping Use: Never used  Substance and Sexual Activity   Alcohol use: No   Drug use: No   Sexual activity: Not Currently  Other Topics Concern   Not on file  Social History Narrative   Not on file   Social Determinants of Health   Financial Resource Strain: Medium Risk (05/31/2022)   Overall Financial Resource Strain (CARDIA)    Difficulty of Paying Living Expenses: Somewhat hard  Food Insecurity: No Food Insecurity (05/31/2022)   Hunger Vital Sign    Worried About Running Out of Food in the Last Year: Never true    Ran Out of Food in the Last Year: Never true  Transportation Needs: No Transportation Needs (05/31/2022)   PRAPARE - Administrator, Civil Service (Medical): No    Lack of Transportation (Non-Medical): No  Physical  Activity: Inactive (05/31/2022)   Exercise Vital Sign    Days of Exercise per Week: 0 days    Minutes of Exercise per Session: 0 min  Stress: No Stress Concern Present (05/31/2022)   [employer] of Occupational Health - Occupational Stress Questionnaire    Feeling  of Stress : Only a little  Social Connections: Moderately Isolated (05/31/2022)   Social Connection and Isolation Panel [NHANES]    Frequency of Communication with Friends and Family: Three times a week    Frequency of Social Gatherings with Friends and Family: Once a week    Attends Religious Services: 1 to 4 times per year    Active Member of [employer] or Organizations: No    Attends Banker Meetings: Never    Marital Status: Widowed    Tobacco Counseling Counseling given: Not Answered   Clinical Intake:  Pre-visit preparation completed: No  Pain : No/denies pain Pain Score: 0-No pain     BMI - recorded: 34.86 Nutritional Status: BMI > 30  Obese Nutritional Risks: None Diabetes: No  How often do you need to have someone help you when you read instructions, pamphlets, or other written materials from your doctor or pharmacy?: 1 - Never  Diabetic? No  Interpreter Needed?: No  Information entered by :: Jacquelyne Balint, RMA   Activities of Daily Living    05/31/2022    9:55 AM 03/01/2022   10:38 AM  In your present state of health, do you have any difficulty performing the following activities:  Hearing? 1 1  Vision? 0 0  Difficulty concentrating or making decisions? 0 0  Walking or climbing stairs? 0 1  Dressing or bathing? 0 0  Doing errands, shopping? 0 0  Preparing Food and eating ? N   Using the Toilet? N   In the past six months, have you accidently leaked urine? Y   Do you have problems with loss of bowel control? N   Managing your Medications? N   Managing your Finances? N   Housekeeping or managing your Housekeeping? N     Patient Care Team: Melida Quitter, PA as PCP -  General (Family Medicine) Barnett Abu, MD as Consulting Physician (Neurosurgery) Nita Sells, MD as Consulting Physician (Dermatology) Salvatore Marvel, MD as Consulting Physician (Orthopedic Surgery) [facility] [place], MD as Attending Physician Melina Fiddler, MD as Consulting Physician (Sports Medicine)  Indicate any recent Medical Services you may have received from other than [facility] providers in the past year (date may be approximate).     Assessment:   This is a routine wellness examination for [place].  Hearing/Vision screen Hearing Screening - Comments:: Followed by [facility] Vision Screening - Comments:: Followed by [facility]  Dietary issues and exercise activities discussed: Current Exercise Habits: The patient does not participate in regular exercise at present, Exercise limited by: None identified   Goals Addressed               This Visit's Progress     Activity and Exercise Increased (pt-stated)        Evidence-based guidance:  Review current exercise levels.  Assess patient perspective on exercise or activity level, barriers to increasing activity, motivation and readiness for change.  Recommend or set healthy exercise goal based on individual tolerance.  Encourage small steps toward making change in amount of exercise or activity.  Urge reduction of sedentary activities or screen time.  Promote group activities within the community or with family or support person.  Consider referral to rehabiliation therapist for assessment and exercise/activity plan.   Notes:       Patient Stated (pt-stated)        Patient would like to lose weight in the next year.  She has noticed that her weight has  increased over the past several months.      Depression Screen    03/01/2022   10:38 AM 01/24/2022    4:26 PM 10/26/2021   11:32 AM 07/18/2021    9:07 AM 03/20/2021    9:35 AM 11/13/2020   11:12 AM 07/10/2020   11:07 AM  PHQ 2/9 Scores  PHQ -  2 Score 0 0 PHQ- 9 Score Fall Risk    05/31/2022    9:55 AM 07/18/2021    9:07 AM 03/20/2021    9:35 AM 11/13/2020   11:11 AM 07/10/2020   11:06 AM  Fall Risk   Falls in the past year? 0 Number falls in past yr: 0 0 1 1 0  Injury with Fall? 0 Risk for fall due to : History of fall(s) History of fall(s) History of fall(s) History of fall(s);Impaired balance/gait Impaired balance/gait;Impaired mobility  Follow up  Falls evaluation completed Falls evaluation completed Falls evaluation completed Falls evaluation completed    FALL RISK PREVENTION PERTAINING TO THE HOME:  Any stairs in or around the home? Yes  If so, are there any without handrails? No  Home free of loose throw rugs in walkways, pet beds, electrical cords, etc? Yes  Adequate lighting in your home to reduce risk of falls? Yes   ASSISTIVE DEVICES UTILIZED TO PREVENT FALLS:  Life alert? Yes  Use of a cane, walker or w/c? No  Grab bars in the bathroom? Yes  Shower chair or bench in shower? Yes  Elevated toilet seat or a handicapped toilet? Yes   TIMED UP AND GO:  Was the test performed? Yes .  Length of time to ambulate 10 feet: 10-15 sec.   Gait steady and fast without use of assistive device  Cognitive Function:        05/31/2022    9:55 AM 03/20/2021    9:21 AM 05/17/2019    1:21 PM 12/30/2016    2:49 PM  6CIT Screen  What Year? 0 points 0 points 0 points 0 points  What month? 0 points 0 points 0 points 0 points  What time? 0 points 0 points 0 points 0 points  Count back from 20 0 points 0 points 0 points 0 points  Months in reverse 0 points 0 points 0 points 0 points  Repeat phrase 0 points 0 points 0 points 0 points  Total Score 0 points 0 points 0 points 0 points    Immunizations Immunization History  Administered Date(s) Administered   Fluad Quad(high Dose 65+) 10/15/2018, 11/13/2020   IPV 01/20/2010   Influenza Split 01/23/2010   Influenza, High  Dose Seasonal PF 11/17/2015, 11/22/2016, 01/18/2022   Influenza-Unspecified 11/26/2017   PFIZER(Purple Top)SARS-COV-2 Vaccination 03/13/2019, 04/03/2019, 12/03/2019   Pfizer Covid-19 Vaccine Bivalent Booster 101yrs & up 09/21/2020   Pneumococcal Conjugate-13 03/25/2008, 11/16/2013   Pneumococcal Polysaccharide-23 03/25/2008, 11/16/2012   Td 01/25/2001   Tdap 02/18/2010, 12/28/2015   Zoster Recombinat (Shingrix) 05/03/2021, 09/26/2021   Zoster, Live 06/26/2006, 02/19/2007, 11/16/2013    TDAP status: Up to date  Flu Vaccine status: Up to date  Pneumococcal vaccine status: Up to date  Covid-19 vaccine status: Information provided on how to obtain vaccines.   Qualifies for Shingles Vaccine? Yes   Zostavax completed No   Shingrix Completed?: Yes  Screening Tests Health Maintenance  Topic Date Due  COVID-19 Vaccine (5 - 2023-24 season) 10/19/2021   INFLUENZA VACCINE  09/19/2022   Medicare Annual Wellness (AWV)  05/31/2023   DTaP/Tdap/Td (4 - Td or Tdap) 12/27/2025   Pneumonia Vaccine 11+ Years old  Completed   DEXA SCAN  Completed   Hepatitis C Screening  Completed   Zoster Vaccines- Shingrix  Completed   HPV VACCINES  Aged Out    Health Maintenance  Health Maintenance Due  Topic Date Due   COVID-19 Vaccine (5 - 2023-24 season) 10/19/2021    Colorectal cancer screening: No longer required.   Mammogram status: Completed 05/17/2022. Repeat every year  Bone Density status: Completed 07/22/2019. Results reflect: Bone density results: NORMAL. Repeat every 10 years.  Lung Cancer Screening: (Low Dose CT Chest recommended if Age 6-80 years, 30 pack-year currently smoking OR have quit w/in 15years.) does not qualify.    Additional Screening:  Hepatitis C Screening: does qualify; Completed 10/04/2016  Vision Screening: Recommended annual ophthalmology exams for early detection of glaucoma and other disorders of the eye. Is the patient up to date with their annual eye exam?   Yes  Who is the provider or what is the name of the office in which the patient attends annual eye exams? [facility] If pt is not established with a provider, would they like to be referred to a provider to establish care?  Established .   Dental Screening: Recommended annual dental exams for proper oral hygiene  Community Resource Referral / Chronic Care Management: CRR required this visit?  No   CCM required this visit?  No      Plan:    Encounter for Medicare annual wellness exam  Benign hypertension Assessment & Plan: Stable.  Continue losartan 100 mg daily.  Last CMP within normal limits.  Will continue to monitor.   Acquired hypothyroidism Assessment & Plan: Last TSH above normal limits at 5.59.  Rechecking thyroid function labs today prior to adjusting any medication.  Continue levothyroxine 25 mcg daily unless labs warrant necessary change.  Orders: -     TSH; Future -     T4, free; Future -     T3, free; Future  Prediabetes Assessment & Plan: Last A1c 5.9.  Will continue to monitor.  Encouraged her to get regular exercise and be mindful of carb intake, limiting sugary foods and drinks and simple carbs like white bread, pasta, rice.  We discussed strategies such as staying stopped with snack options like baby carrots or celery that are already prepped and easy to grab.   We also discussed that her most recent mammogram showed no concerning findings, colonoscopy up-to-date and will not need to be repeated per recommendation from gastroenterologist given her age.  Up-to-date on vaccinations other than pneumonia.  We discussed the Prevnar 20 including its indications and recommendations and possible side effects.  Patient is aware that she can get that at her local pharmacy, but she requested some printed information to review prior to completing the immunization.  Provided printed patient education and after visit summary for PCV 20.  I have personally reviewed and  noted the following in the patient's chart:   Medical and social history Use of alcohol, tobacco or illicit drugs  Current medications and supplements including opioid prescriptions. Patient is not currently taking opioid prescriptions. Functional ability and status Nutritional status Physical activity Advanced directives List of other physicians Hospitalizations, surgeries, and ER visits in previous 12 months Vitals Screenings to include cognitive, depression, and falls Referrals  and appointments  In addition, I have reviewed and discussed with patient certain preventive protocols, quality metrics, and best practice recommendations. A written personalized care plan for preventive services as well as general preventive health recommendations were provided to patient.    Return in 4 months (on 09/30/2022) for follow-up for hypertension, prediabetes, thyroid, hyperlipidemia, fasting blood work 1 week before.  Melida Quitter, PA   05/31/2022   Nurse Notes: Face to face 25 min

## 2022-05-31 NOTE — Assessment & Plan Note (Signed)
Last TSH above normal limits at 5.59.  Rechecking thyroid function labs today prior to adjusting any medication.  Continue levothyroxine 25 mcg daily unless labs warrant necessary change.

## 2022-05-31 NOTE — Assessment & Plan Note (Signed)
Stable.  Continue losartan 100 mg daily.  Last CMP within normal limits.  Will continue to monitor.

## 2022-06-05 ENCOUNTER — Other Ambulatory Visit: Payer: PPO

## 2022-06-05 DIAGNOSIS — E039 Hypothyroidism, unspecified: Secondary | ICD-10-CM | POA: Diagnosis not present

## 2022-06-06 LAB — T4, FREE: Free T4: 1.12 ng/dL (ref 0.82–1.77)

## 2022-06-06 LAB — TSH: TSH: 4.96 u[IU]/mL — ABNORMAL HIGH (ref 0.450–4.500)

## 2022-06-06 LAB — T3, FREE: T3, Free: 3.2 pg/mL (ref 2.0–4.4)

## 2022-06-10 ENCOUNTER — Other Ambulatory Visit: Payer: Self-pay | Admitting: Nurse Practitioner

## 2022-06-10 DIAGNOSIS — E782 Mixed hyperlipidemia: Secondary | ICD-10-CM

## 2022-06-25 ENCOUNTER — Other Ambulatory Visit: Payer: Self-pay | Admitting: Family Medicine

## 2022-06-25 DIAGNOSIS — F419 Anxiety disorder, unspecified: Secondary | ICD-10-CM

## 2022-06-25 DIAGNOSIS — F339 Major depressive disorder, recurrent, unspecified: Secondary | ICD-10-CM

## 2022-07-03 DIAGNOSIS — M1712 Unilateral primary osteoarthritis, left knee: Secondary | ICD-10-CM | POA: Diagnosis not present

## 2022-07-05 ENCOUNTER — Ambulatory Visit: Payer: PPO | Admitting: Podiatry

## 2022-07-08 DIAGNOSIS — M2041 Other hammer toe(s) (acquired), right foot: Secondary | ICD-10-CM | POA: Diagnosis not present

## 2022-07-08 DIAGNOSIS — M2042 Other hammer toe(s) (acquired), left foot: Secondary | ICD-10-CM | POA: Diagnosis not present

## 2022-07-08 DIAGNOSIS — M7742 Metatarsalgia, left foot: Secondary | ICD-10-CM | POA: Diagnosis not present

## 2022-08-11 ENCOUNTER — Other Ambulatory Visit: Payer: Self-pay | Admitting: Family Medicine

## 2022-08-11 DIAGNOSIS — F5102 Adjustment insomnia: Secondary | ICD-10-CM

## 2022-08-12 ENCOUNTER — Other Ambulatory Visit: Payer: Self-pay

## 2022-08-12 DIAGNOSIS — L304 Erythema intertrigo: Secondary | ICD-10-CM

## 2022-08-12 DIAGNOSIS — E038 Other specified hypothyroidism: Secondary | ICD-10-CM

## 2022-08-12 MED ORDER — LEVOTHYROXINE SODIUM 25 MCG PO TABS
25.0000 ug | ORAL_TABLET | Freq: Every day | ORAL | 0 refills | Status: DC
Start: 2022-08-12 — End: 2022-09-10

## 2022-08-12 MED ORDER — KETOCONAZOLE 2 % EX CREA
TOPICAL_CREAM | Freq: Every day | CUTANEOUS | 1 refills | Status: DC
Start: 2022-08-12 — End: 2023-04-28

## 2022-08-23 ENCOUNTER — Other Ambulatory Visit: Payer: Self-pay

## 2022-08-23 DIAGNOSIS — Z130 Encounter for screening for diseases of the blood and blood-forming organs and certain disorders involving the immune mechanism: Secondary | ICD-10-CM

## 2022-08-23 DIAGNOSIS — E785 Hyperlipidemia, unspecified: Secondary | ICD-10-CM

## 2022-08-23 DIAGNOSIS — R7303 Prediabetes: Secondary | ICD-10-CM

## 2022-08-23 DIAGNOSIS — Z0000 Encounter for general adult medical examination without abnormal findings: Secondary | ICD-10-CM

## 2022-09-03 ENCOUNTER — Other Ambulatory Visit: Payer: PPO

## 2022-09-03 DIAGNOSIS — R7303 Prediabetes: Secondary | ICD-10-CM

## 2022-09-03 DIAGNOSIS — Z130 Encounter for screening for diseases of the blood and blood-forming organs and certain disorders involving the immune mechanism: Secondary | ICD-10-CM

## 2022-09-03 DIAGNOSIS — Z1329 Encounter for screening for other suspected endocrine disorder: Secondary | ICD-10-CM | POA: Diagnosis not present

## 2022-09-03 DIAGNOSIS — Z0000 Encounter for general adult medical examination without abnormal findings: Secondary | ICD-10-CM

## 2022-09-03 DIAGNOSIS — Z1321 Encounter for screening for nutritional disorder: Secondary | ICD-10-CM | POA: Diagnosis not present

## 2022-09-03 DIAGNOSIS — E785 Hyperlipidemia, unspecified: Secondary | ICD-10-CM | POA: Diagnosis not present

## 2022-09-03 DIAGNOSIS — Z13228 Encounter for screening for other metabolic disorders: Secondary | ICD-10-CM | POA: Diagnosis not present

## 2022-09-04 LAB — CBC WITH DIFFERENTIAL/PLATELET
Basophils Absolute: 0.1 10*3/uL (ref 0.0–0.2)
Basos: 1 %
EOS (ABSOLUTE): 0.6 10*3/uL — ABNORMAL HIGH (ref 0.0–0.4)
Eos: 6 %
Hematocrit: 42.9 % (ref 34.0–46.6)
Hemoglobin: 13.7 g/dL (ref 11.1–15.9)
Immature Grans (Abs): 0 10*3/uL (ref 0.0–0.1)
Immature Granulocytes: 0 %
Lymphocytes Absolute: 2.6 10*3/uL (ref 0.7–3.1)
Lymphs: 27 %
MCH: 29 pg (ref 26.6–33.0)
MCHC: 31.9 g/dL (ref 31.5–35.7)
MCV: 91 fL (ref 79–97)
Monocytes Absolute: 0.8 10*3/uL (ref 0.1–0.9)
Monocytes: 8 %
Neutrophils Absolute: 5.6 10*3/uL (ref 1.4–7.0)
Neutrophils: 58 %
Platelets: 361 10*3/uL (ref 150–450)
RBC: 4.72 x10E6/uL (ref 3.77–5.28)
RDW: 13.1 % (ref 11.7–15.4)
WBC: 9.6 10*3/uL (ref 3.4–10.8)

## 2022-09-04 LAB — COMPREHENSIVE METABOLIC PANEL
ALT: 17 IU/L (ref 0–32)
AST: 17 IU/L (ref 0–40)
Albumin: 4.1 g/dL (ref 3.8–4.8)
Alkaline Phosphatase: 72 IU/L (ref 44–121)
BUN/Creatinine Ratio: 14 (ref 12–28)
BUN: 12 mg/dL (ref 8–27)
Bilirubin Total: 0.2 mg/dL (ref 0.0–1.2)
CO2: 23 mmol/L (ref 20–29)
Calcium: 9.6 mg/dL (ref 8.7–10.3)
Chloride: 99 mmol/L (ref 96–106)
Creatinine, Ser: 0.85 mg/dL (ref 0.57–1.00)
Globulin, Total: 2.5 g/dL (ref 1.5–4.5)
Glucose: 98 mg/dL (ref 70–99)
Potassium: 4.1 mmol/L (ref 3.5–5.2)
Sodium: 137 mmol/L (ref 134–144)
Total Protein: 6.6 g/dL (ref 6.0–8.5)
eGFR: 69 mL/min/{1.73_m2} (ref >59)

## 2022-09-04 LAB — HEMOGLOBIN A1C
Est. average glucose Bld gHb Est-mCnc: 117 mg/dL
Hgb A1c MFr Bld: 5.7 % — ABNORMAL HIGH (ref 4.8–5.6)

## 2022-09-04 LAB — LIPID PANEL
Chol/HDL Ratio: 3.5 ratio (ref 0.0–4.4)
Cholesterol, Total: 173 mg/dL (ref 100–199)
HDL: 49 mg/dL (ref >39)
LDL Chol Calc (NIH): 102 mg/dL — ABNORMAL HIGH (ref 0–99)
Triglycerides: 125 mg/dL (ref 0–149)
VLDL Cholesterol Cal: 22 mg/dL (ref 5–40)

## 2022-09-04 LAB — TSH: TSH: 6.27 u[IU]/mL — ABNORMAL HIGH (ref 0.450–4.500)

## 2022-09-09 ENCOUNTER — Other Ambulatory Visit: Payer: Self-pay | Admitting: Nurse Practitioner

## 2022-09-09 DIAGNOSIS — E781 Pure hyperglyceridemia: Secondary | ICD-10-CM

## 2022-09-09 DIAGNOSIS — E782 Mixed hyperlipidemia: Secondary | ICD-10-CM

## 2022-09-10 ENCOUNTER — Ambulatory Visit: Payer: PPO | Admitting: Family Medicine

## 2022-09-10 ENCOUNTER — Encounter: Payer: Self-pay | Admitting: Family Medicine

## 2022-09-10 VITALS — BP 136/66 | HR 99 | Temp 97.5°F | Ht 66.0 in | Wt 221.0 lb

## 2022-09-10 DIAGNOSIS — E782 Mixed hyperlipidemia: Secondary | ICD-10-CM | POA: Diagnosis not present

## 2022-09-10 DIAGNOSIS — E039 Hypothyroidism, unspecified: Secondary | ICD-10-CM | POA: Diagnosis not present

## 2022-09-10 DIAGNOSIS — I10 Essential (primary) hypertension: Secondary | ICD-10-CM | POA: Diagnosis not present

## 2022-09-10 DIAGNOSIS — R7303 Prediabetes: Secondary | ICD-10-CM

## 2022-09-10 DIAGNOSIS — F419 Anxiety disorder, unspecified: Secondary | ICD-10-CM

## 2022-09-10 DIAGNOSIS — F32A Depression, unspecified: Secondary | ICD-10-CM

## 2022-09-10 MED ORDER — LEVOTHYROXINE SODIUM 50 MCG PO TABS
50.0000 ug | ORAL_TABLET | Freq: Every day | ORAL | 1 refills | Status: DC
Start: 2022-09-10 — End: 2022-12-12

## 2022-09-10 MED ORDER — SIMVASTATIN 20 MG PO TABS
ORAL_TABLET | ORAL | 1 refills | Status: DC
Start: 2022-09-10 — End: 2022-12-12

## 2022-09-10 MED ORDER — LOSARTAN POTASSIUM 100 MG PO TABS
100.0000 mg | ORAL_TABLET | Freq: Every day | ORAL | 1 refills | Status: DC
Start: 2022-09-10 — End: 2022-12-12

## 2022-09-10 MED ORDER — AMLODIPINE BESYLATE 5 MG PO TABS
5.0000 mg | ORAL_TABLET | Freq: Every day | ORAL | 1 refills | Status: DC
Start: 2022-09-10 — End: 2022-12-12

## 2022-09-10 NOTE — Assessment & Plan Note (Signed)
Last lipid panel: LDL 102, HDL 49, triglycerides 125.  Niacin caused rash so discontinue.  Continue simvastatin 20 mg daily.  Will continue to monitor and recheck lipid panel in a few months.  If cholesterol not well-controlled with simvastatin alone, consider increasing dose or changing to higher intensity statin.

## 2022-09-10 NOTE — Patient Instructions (Signed)
You will not need to be fasting for your next lab appointment!

## 2022-09-10 NOTE — Progress Notes (Signed)
Established Patient Office Visit  Subjective   Patient ID: Shelly Hickman, female    DOB: 10-05-1940  Age: 82 y.o. MRN: [id number]  Chief Complaint  Patient presents with   Follow Up Labs    HPI Shelly Hickman is a 82 y.o. female presenting today for follow up of hypertension, hyperlipidemia, prediabetes, hypothyroidism, mood. Hypertension:   Pt denies chest pain, SOB, dizziness, edema, syncope, fatigue or heart palpitations. Taking losartan, amlodipine, reports excellent compliance with treatment. Denies side effects. Hyperlipidemia: tolerating simvastatin well with no myalgias or significant side effects.  She develops horrible episodes of itching and a rash on her forearms after taking the niacin.  When she picked up her next bottle, she told the pharmacist what had happened and they recommended a trial of discontinuing.  When she did stop the medication, the episodes also stopped. The ASCVD Risk score (Arnett DK, et al., 2019) failed to calculate for the following reasons:   The 2019 ASCVD risk score is only valid for ages 17 to 27 Prediabetes: denies hypoglycemic events, wounds or sores that are not healing well, increased thirst or urination.  Hypothyroidism: Taking levothyroxine 25 mcg regularly in the AM away from food and vitamins. Denies fatigue, weight changes, heat/cold intolerance, skin/hair changes, bowel changes, CVS symptoms. Mood: Patient is here to follow up for anxiety and, currently managing with Cymbalta. Taking medication without side effects, reports excellent compliance with treatment. She feels mood is fairly stable since last visit.     09/10/2022   11:16 AM 05/31/2022   10:47 AM 05/31/2022   10:46 AM  Depression screen PHQ 2/9  Decreased Interest 1 1 1   Down, Depressed, Hopeless 0 1 1  PHQ - 2 Score 1 2 2   Altered sleeping 1 1 1   Tired, decreased energy 1 1 1   Change in appetite 2 1 1   Feeling bad or failure about yourself  1 0 0  Trouble concentrating  0 0 0  Moving slowly or fidgety/restless 0 0 0  Suicidal thoughts 0 0 0  PHQ-9 Score 6 5 5   Difficult doing work/chores Very difficult Somewhat difficult Somewhat difficult       09/10/2022   11:18 AM 05/31/2022   10:47 AM 03/01/2022   10:38 AM 01/24/2022    4:27 PM  GAD 7 : Generalized Anxiety Score  Nervous, Anxious, on Edge 0 1 0 0  Control/stop worrying 0 1 0 1  Worry too much - different things 1 1 0 1  Trouble relaxing 0 0 0 1  Restless 0 0 0 0  Easily annoyed or irritable 0 0 0 1  Afraid - awful might happen 1 0 0 0  Total GAD 7 Score 2 3 0 4  Anxiety Difficulty Very difficult Somewhat difficult     ROS Negative unless otherwise noted in HPI   Objective:     BP 136/66   Pulse 99   Temp (!) 97.5 F (36.4 C) (Oral)   Ht 5\' 6"  (1.676 m)   Wt 221 lb (100.2 kg)   SpO2 94%   BMI 35.67 kg/m   Physical Exam Constitutional:      General: She is not in acute distress.    Appearance: Normal appearance.  HENT:     Head: Normocephalic and atraumatic.  Cardiovascular:     Rate and Rhythm: Normal rate and regular rhythm.     Heart sounds: No murmur heard.    No friction rub. No gallop.  Pulmonary:  Effort: Pulmonary effort is normal. No respiratory distress.     Breath sounds: No wheezing, rhonchi or rales.  Skin:    General: Skin is warm and dry.  Neurological:     Mental Status: She is alert and oriented to person, place, and time.      Assessment & Plan:  Benign hypertension Assessment & Plan: Stable.  Continue losartan 100 mg daily.  Last CMP within normal limits.  Will continue to monitor.  Orders: -     Losartan Potassium; Take 1 tablet (100 mg total) by mouth daily.  Dispense: 90 tablet; Refill: 1 -     amLODIPine Besylate; Take 1 tablet (5 mg total) by mouth daily.  Dispense: 90 tablet; Refill: 1  Mixed hyperlipidemia Assessment & Plan: Last lipid panel: LDL 102, HDL 49, triglycerides 125.  Niacin caused rash so discontinue.  Continue simvastatin  20 mg daily.  Will continue to monitor and recheck lipid panel in a few months.  If cholesterol not well-controlled with simvastatin alone, consider increasing dose or changing to higher intensity statin.  Orders: -     Simvastatin; TAKE 1 TABLET BY MOUTH AT BEDTIME  Dispense: 90 tablet; Refill: 1  Prediabetes Assessment & Plan: Last A1c 5.7, decreased from 5.9.  Will continue to monitor.  Encouraged her to get regular exercise and be mindful of carb intake, limiting sugary foods and drinks and simple carbs like white bread, pasta, rice.     Acquired hypothyroidism Assessment & Plan: Last TSH above normal limits at 6.270, before that 5.59.  Increased dose to levothyroxine 50 mcg daily.  Repeat thyroid labs in about 8 weeks and adjust as indicated by results.  Orders: -     Levothyroxine Sodium; Take 1 tablet (50 mcg total) by mouth daily before breakfast.  Dispense: 90 tablet; Refill: 1  Depression, unspecified depression type  Anxiety Assessment & Plan: PHQ-9 score 5, GAD-7 score 3.  Continue Cymbalta 120 mg daily, trazodone 100 mg daily at bedtime.  Will continue to monitor.   Reviewed most recent labs including CBC, CMP, lipid panel, A1c, TSH.  Labs within normal limits with the exception of A1c which is slightly elevated at 5.7 but down from 5.9.  LDL is also slightly elevated at 102.  TSH remains elevated at 6.270, and increased from the last check.  Return in about 3 months (around 12/11/2022) for follow-up for thyroid medicine adjustment, blood work (not fasting) 1 week before.    Shelly Quitter, PA

## 2022-09-10 NOTE — Assessment & Plan Note (Signed)
Last A1c 5.7, decreased from 5.9.  Will continue to monitor.  Encouraged her to get regular exercise and be mindful of carb intake, limiting sugary foods and drinks and simple carbs like white bread, pasta, rice.

## 2022-09-10 NOTE — Assessment & Plan Note (Signed)
Last TSH above normal limits at 6.270, before that 5.59.  Increased dose to levothyroxine 50 mcg daily.  Repeat thyroid labs in about 8 weeks and adjust as indicated by results.

## 2022-09-10 NOTE — Assessment & Plan Note (Signed)
PHQ-9 score 5, GAD-7 score 3.  Continue Cymbalta 120 mg daily, trazodone 100 mg daily at bedtime.  Will continue to monitor.

## 2022-09-10 NOTE — Assessment & Plan Note (Signed)
Stable.  Continue losartan 100 mg daily.  Last CMP within normal limits.  Will continue to monitor.

## 2022-09-23 ENCOUNTER — Other Ambulatory Visit: Payer: Self-pay | Admitting: Nurse Practitioner

## 2022-09-23 DIAGNOSIS — E785 Hyperlipidemia, unspecified: Secondary | ICD-10-CM

## 2022-09-23 DIAGNOSIS — E781 Pure hyperglyceridemia: Secondary | ICD-10-CM

## 2022-09-27 ENCOUNTER — Other Ambulatory Visit: Payer: Self-pay | Admitting: Family Medicine

## 2022-09-27 DIAGNOSIS — F339 Major depressive disorder, recurrent, unspecified: Secondary | ICD-10-CM

## 2022-09-27 DIAGNOSIS — F419 Anxiety disorder, unspecified: Secondary | ICD-10-CM

## 2022-09-30 IMAGING — DX DG KNEE COMPLETE 4+V*L*
4 series · 4 of 4 positions shown · non-contrast
Comparison: 03/13/2016

CLINICAL DATA: Left knee pain after fall 1 day ago

EXAM:
LEFT KNEE - COMPLETE 4+ VIEW

[x knee ap left]
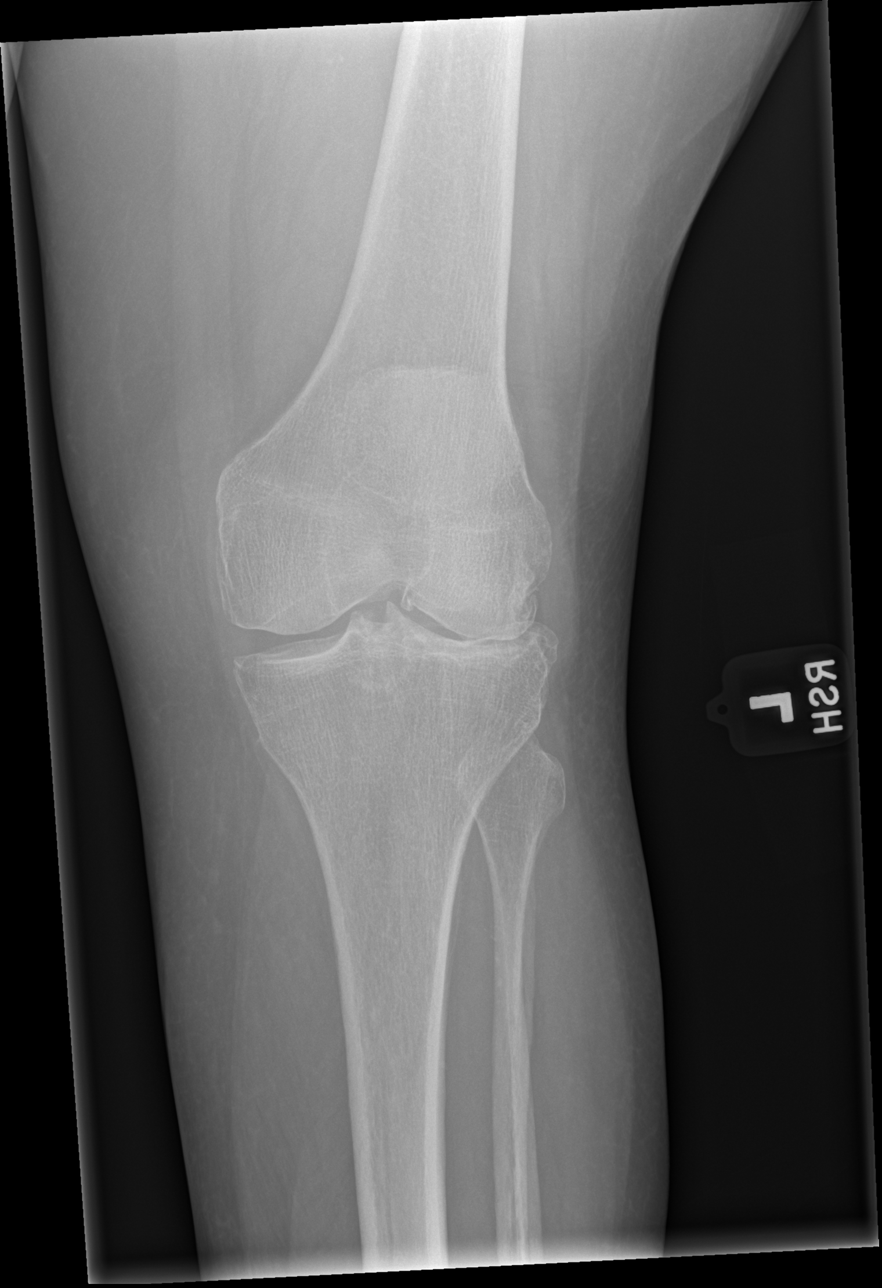

[x knee obl left (1 of 2)]
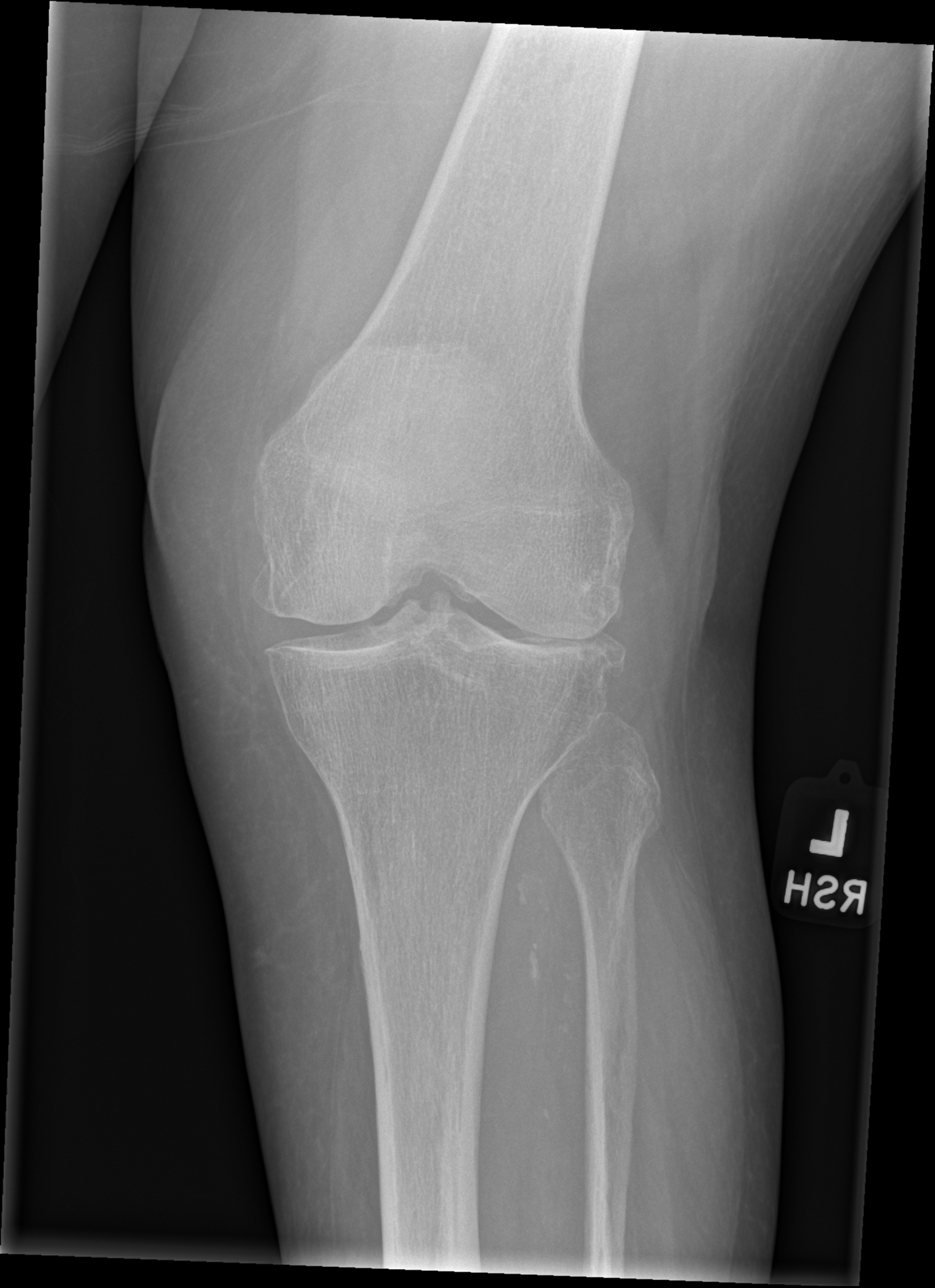

[x knee obl left (2 of 2)]
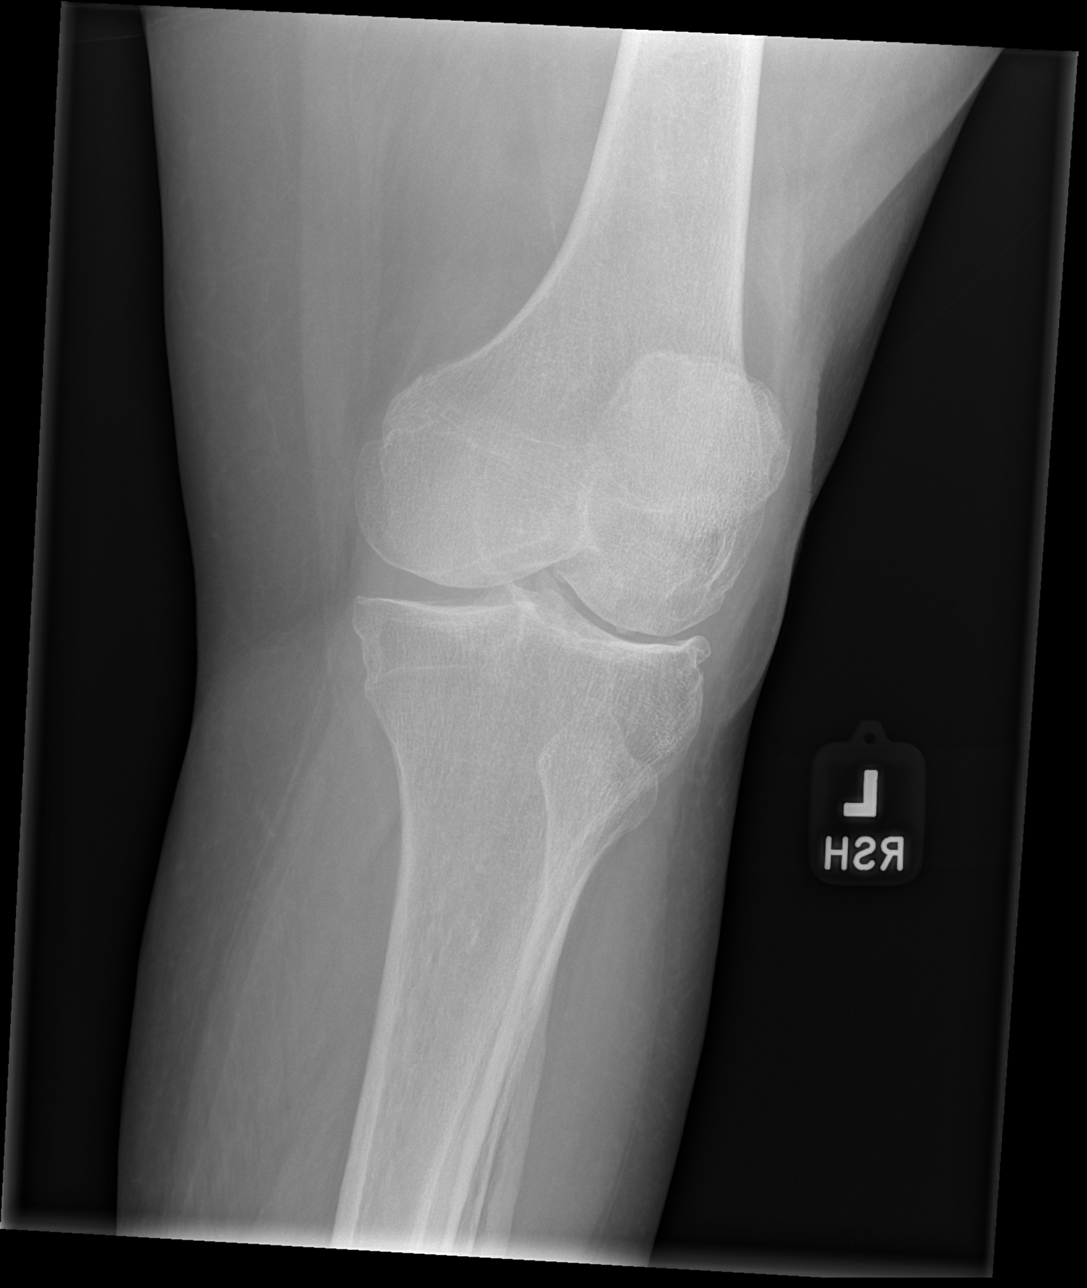

[x knee lat left]
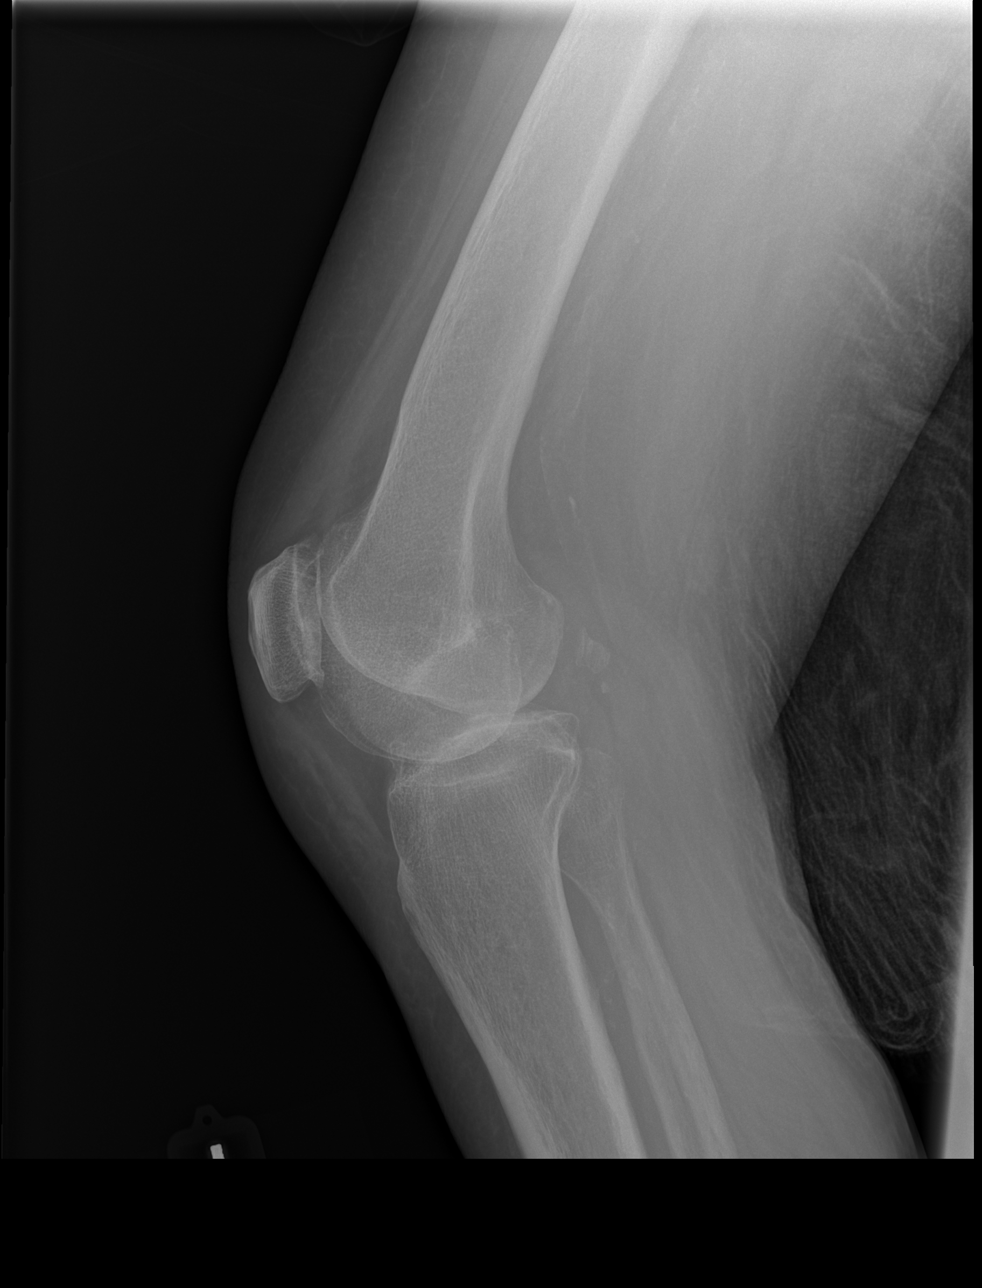

[4 of 4 positions shown; findings below may reference images not displayed]

FINDINGS: No acute fracture or malalignment. Moderate to severe osteoarthritis
of the left knee which is most pronounced in the lateral
compartment. Findings have progressed from the previous study. Trace
joint effusion, nonspecific. Advanced atherosclerotic vascular
calcifications. No focal soft tissue swelling is evident.
IMPRESSION: 1. No acute osseous abnormality, left knee.
2. Moderate to severe osteoarthritis of the left knee, most
pronounced in the lateral compartment, progressed from the previous
exam.

## 2022-09-30 IMAGING — CT CT HEAD W/O CM
4 series · 16 of 47 positions shown, 18 images · non-contrast
Comparison: None.

CLINICAL DATA: Facial trauma, falls

EXAM:
CT HEAD WITHOUT CONTRAST
TECHNIQUE: Contiguous axial images were obtained from the base of the skull
through the vertex without intravenous contrast.

[Series 3: head wo · axial · 0.43mm/px · z∈[-100,+20]mm · 7 of 32 slices shown, 9 images]
[im 4/32  brain]
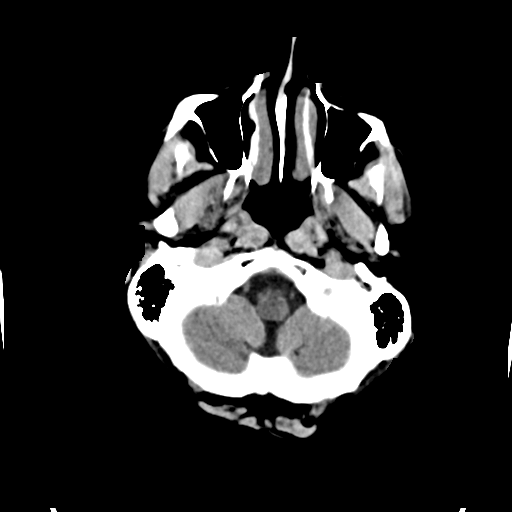
[im 4/32  bone]
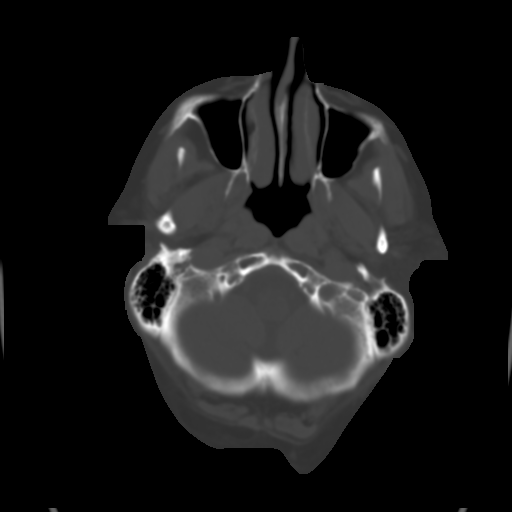
[im 8/32  brain]
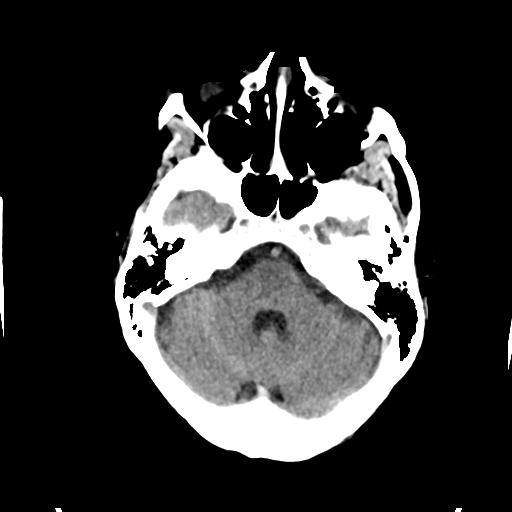
[im 12/32  brain]
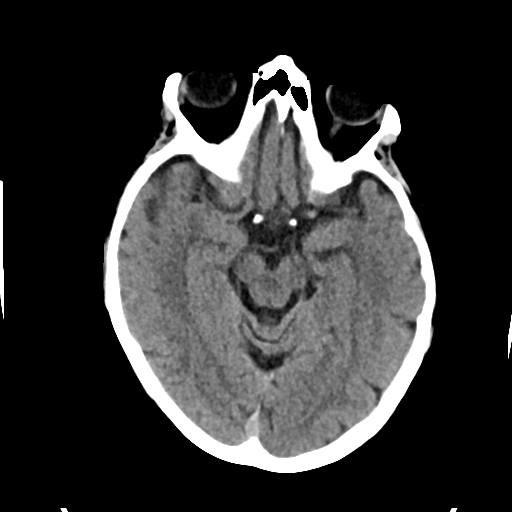
[im 16/32  brain]
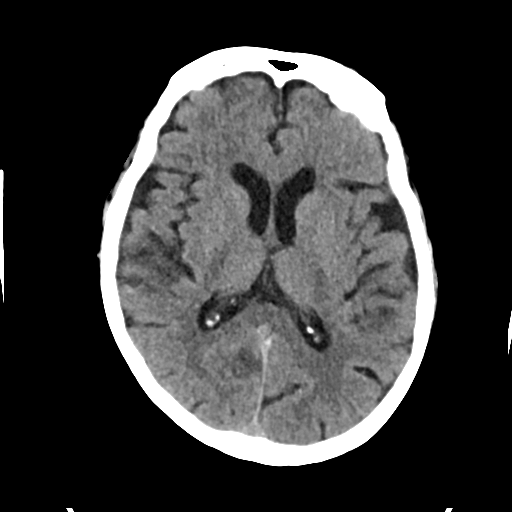
[im 20/32  brain]
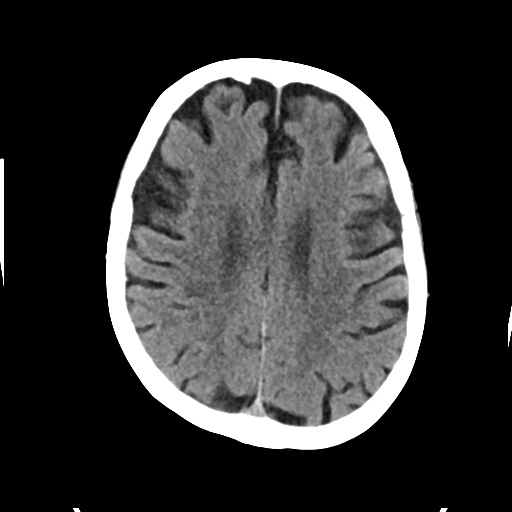
[im 20/32  bone]
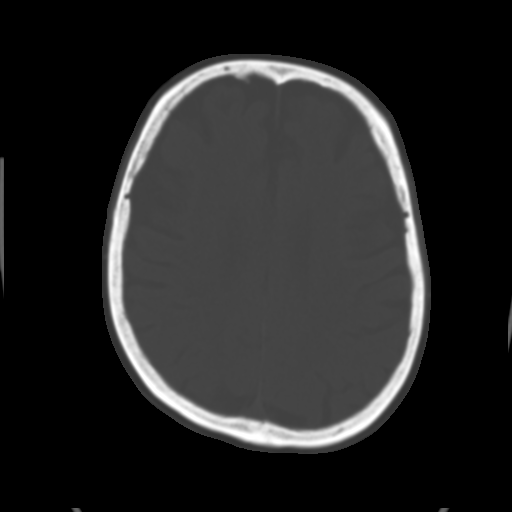
[im 24/32  brain]
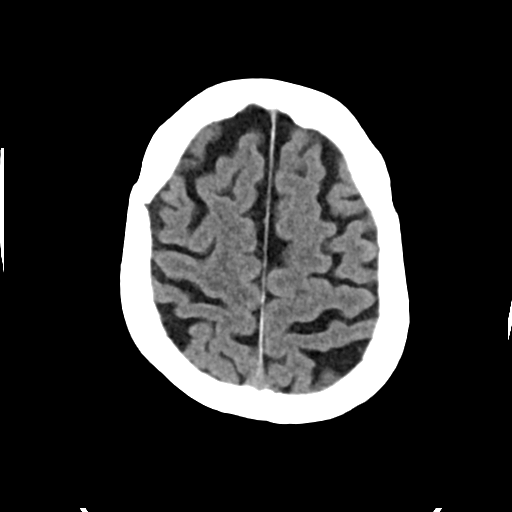
[im 28/32  brain]
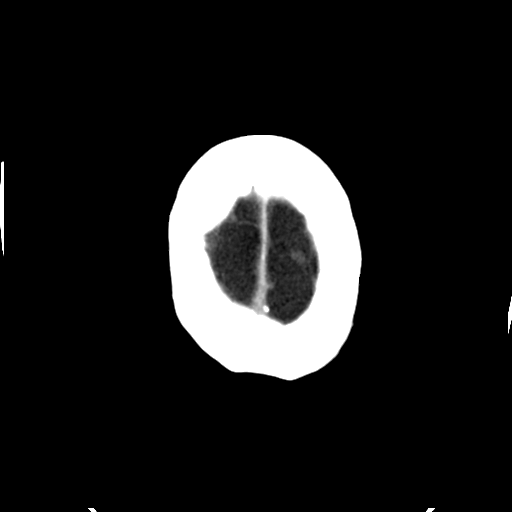

[Series 4: head bone · axial · 0.43mm/px · z∈[-101,-69]mm · 3 of 79 slices shown]
[im 8/79  bone]
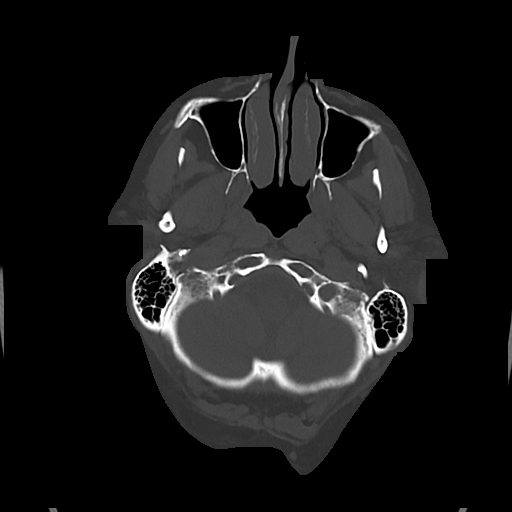
[im 16/79  bone]
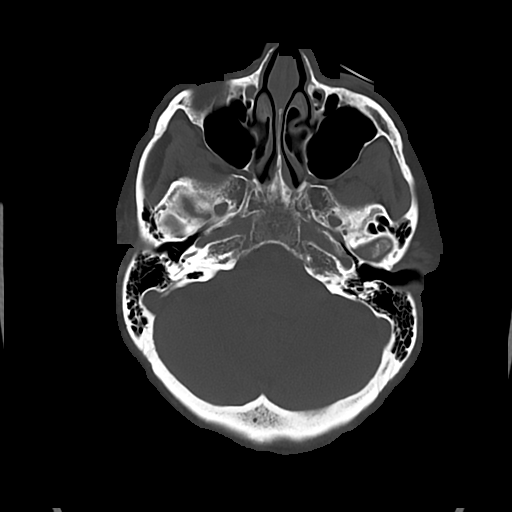
[im 24/79  bone]
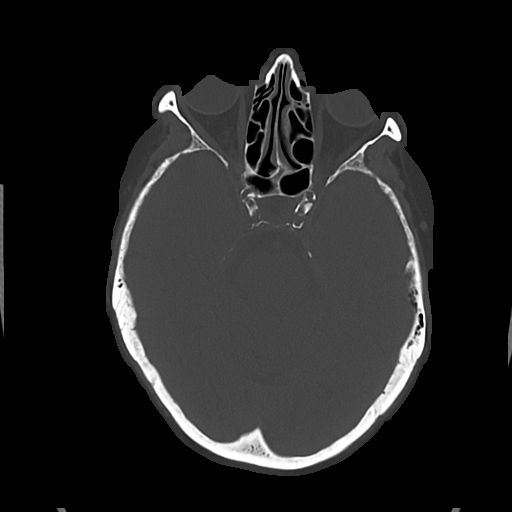

[Series 5: cor soft · coronal · 0.34mm/px · 3 of 64 slices shown]
[im 22/64  brain]
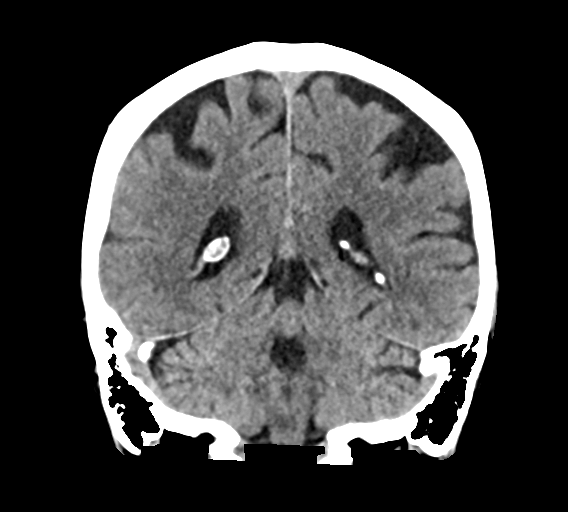
[im 29/64  brain]
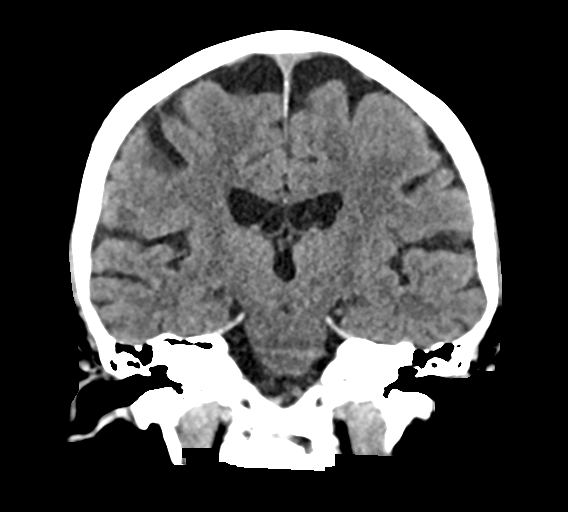
[im 36/64  brain]
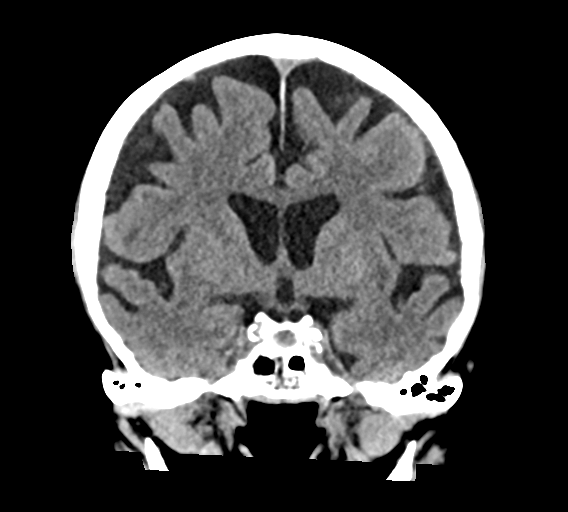

[Series 6: sag soft · sagittal · 0.34mm/px · 3 of 53 slices shown]
[im 18/53  brain]
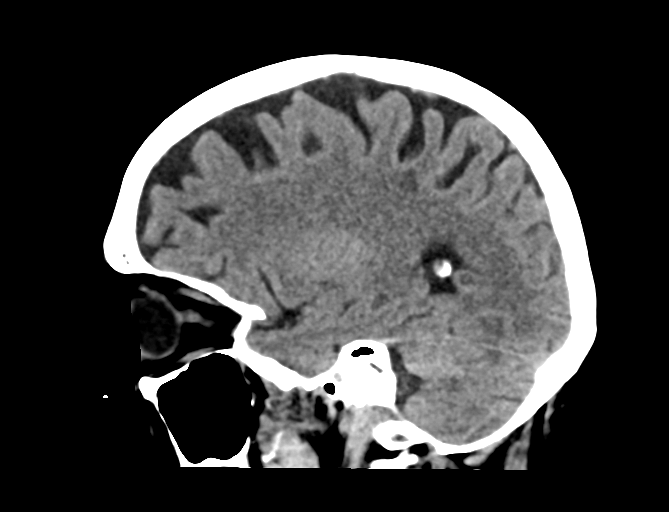
[im 27/53  brain]
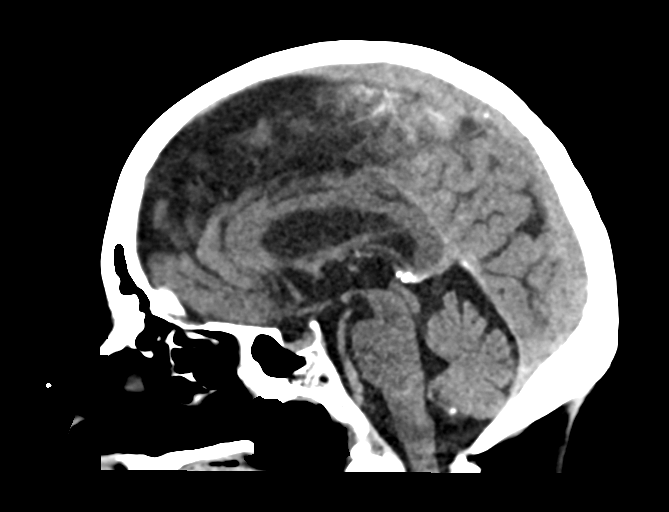
[im 35/53  brain]
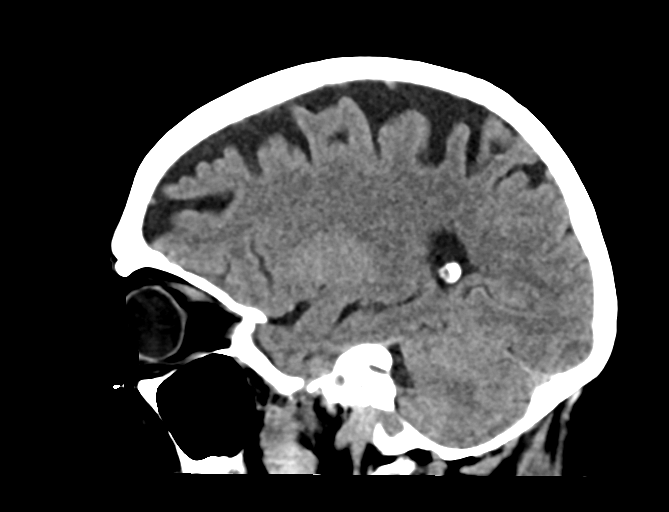

[16 of 47 positions shown; findings below may reference images not displayed]

FINDINGS: Brain: There is no acute intracranial hemorrhage, mass effect, or
edema. Gray-white differentiation is preserved. There is no
extra-axial fluid collection. Ventricles and sulci are within normal
limits in size and configuration. Patchy low-density in the
supratentorial white matter is nonspecific but may reflect minor
chronic microvascular ischemic changes.

Vascular: There is atherosclerotic calcification at the skull base.

Skull: Calvarium is unremarkable.

Sinuses/Orbits: No acute finding.

Other: None.
IMPRESSION: No evidence of acute intracranial injury.

## 2022-09-30 IMAGING — CT CT CHEST-ABD-PELV W/ CM
2 of 5 series · 14 of 46 positions shown, 16 images · IV contrast (omnipaque)
Comparison: 12/10/2012

CLINICAL DATA: Multiple falls. Abdominal trauma Chest trauma,
mod-severe

EXAM:
CT CHEST, ABDOMEN, AND PELVIS WITH CONTRAST
TECHNIQUE: Multidetector CT imaging of the chest, abdomen and pelvis was
performed following the standard protocol during bolus
administration of intravenous contrast.
CONTRAST:  100mL OMNIPAQUE IOHEXOL 300 MG/ML  SOLN

[Series 3: cap with · axial · 0.84mm/px · z∈[-755,-235]mm · 11 of 126 slices shown, 13 images]
[im 11/126  soft-tissue]
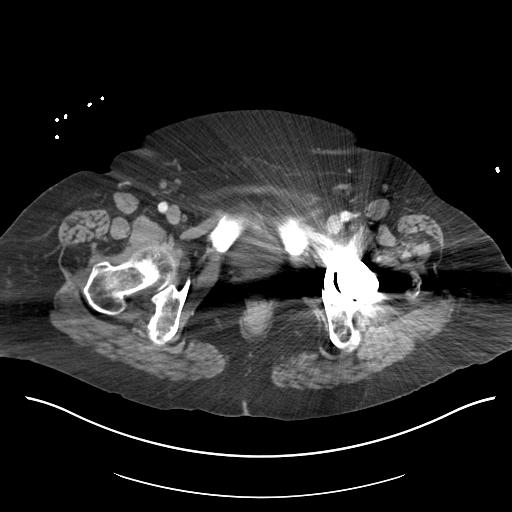
[im 11/126  bone]
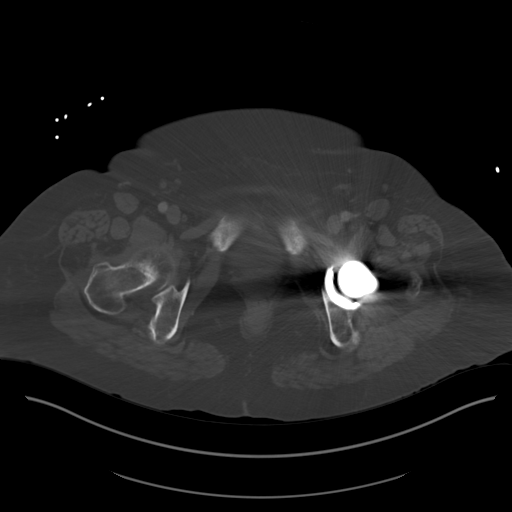
[im 21/126  soft-tissue]
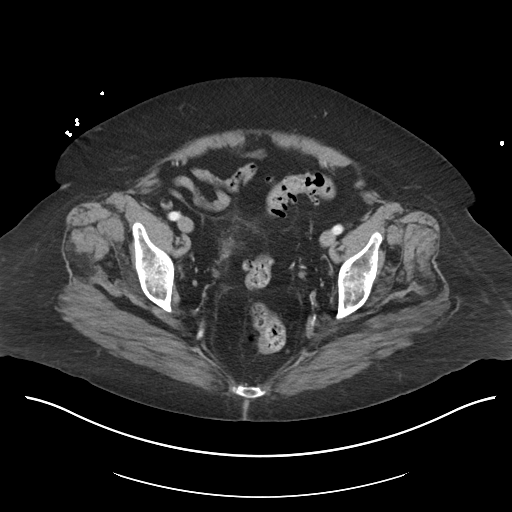
[im 32/126  soft-tissue]
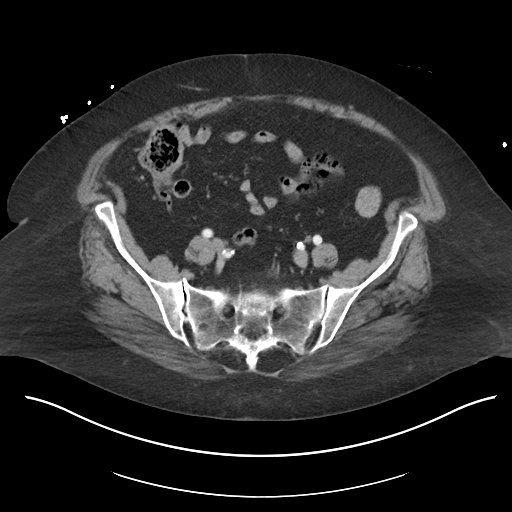
[im 42/126  soft-tissue]
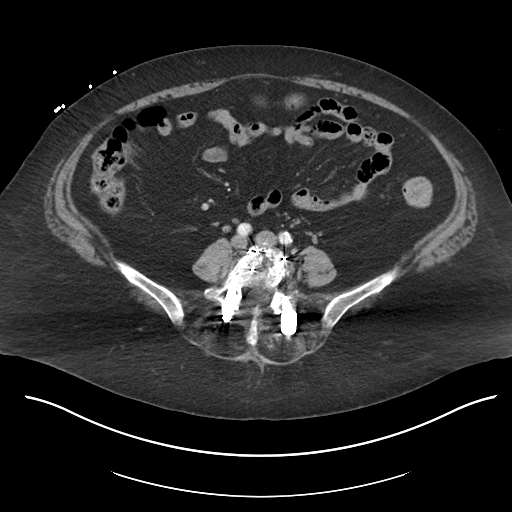
[im 53/126  soft-tissue]
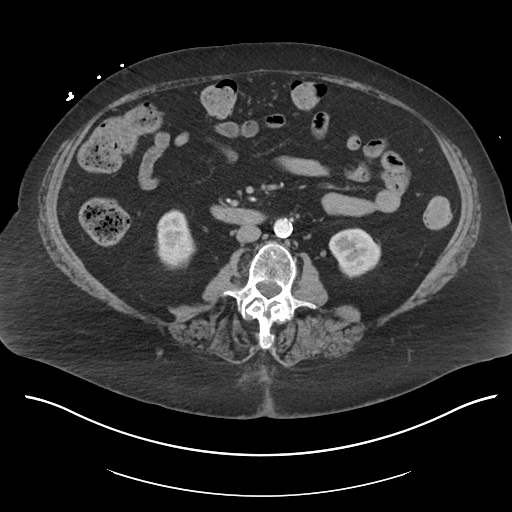
[im 63/126  soft-tissue]
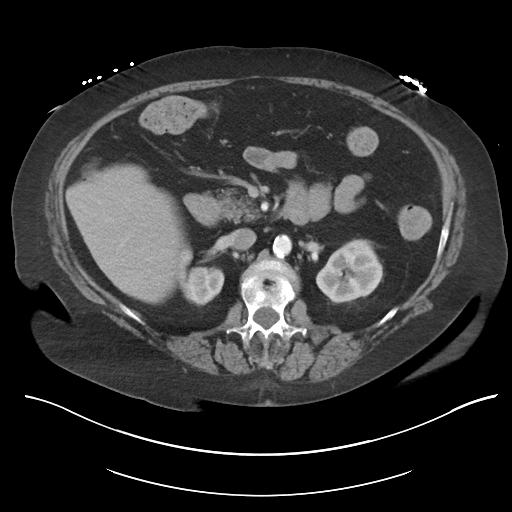
[im 73/126  soft-tissue]
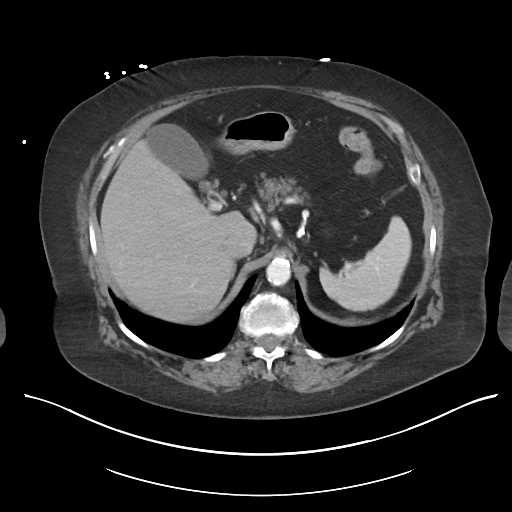
[im 84/126  soft-tissue]
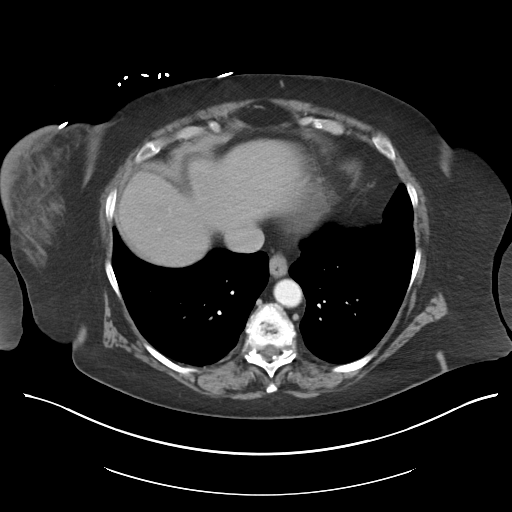
[im 94/126  soft-tissue]
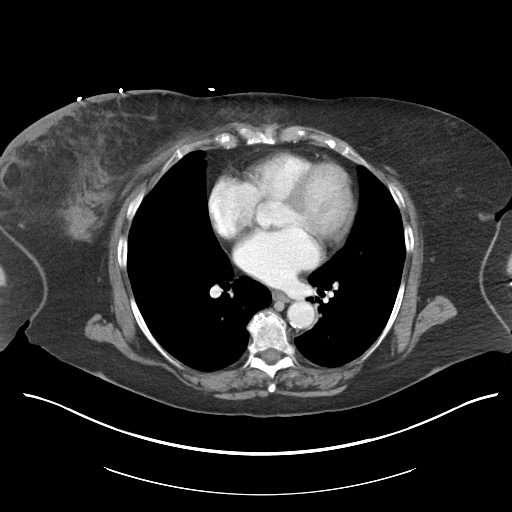
[im 94/126  bone]
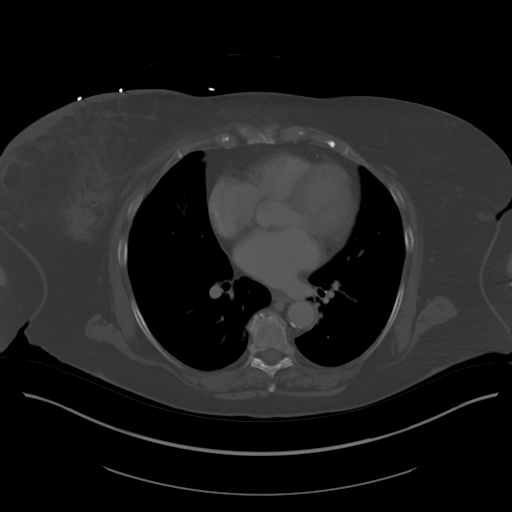
[im 105/126  soft-tissue]
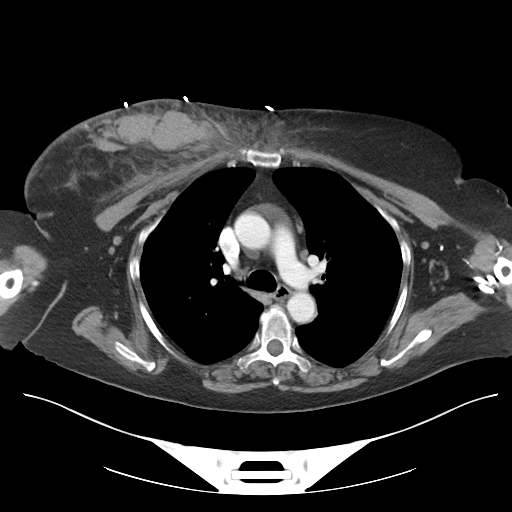
[im 115/126  soft-tissue]
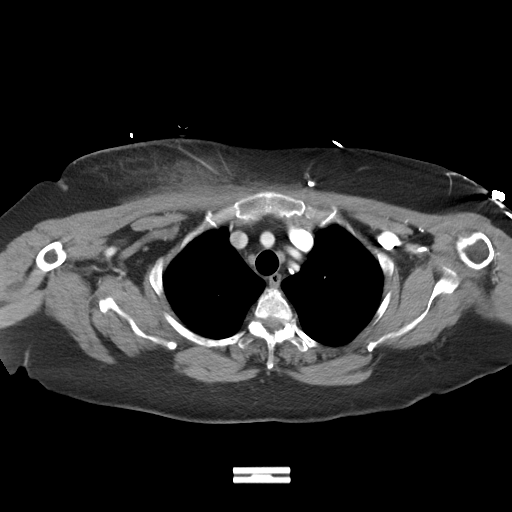

[Series 6: cor · coronal · 0.92mm/px · 3 of 98 slices shown]
[im 33/98  soft-tissue]
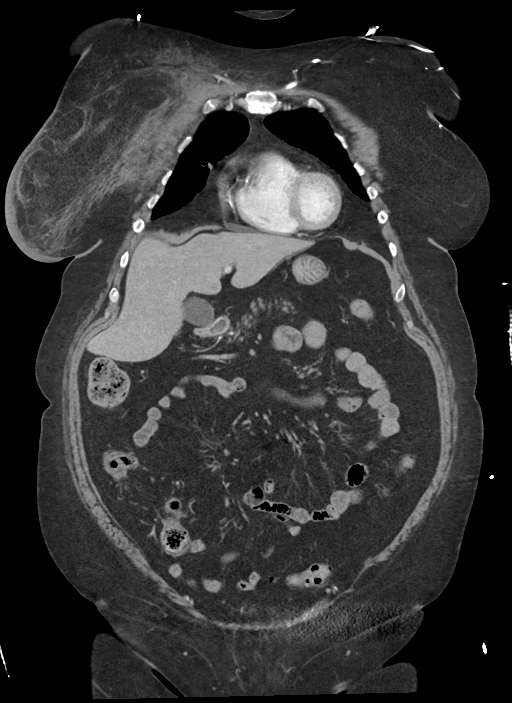
[im 44/98  soft-tissue]
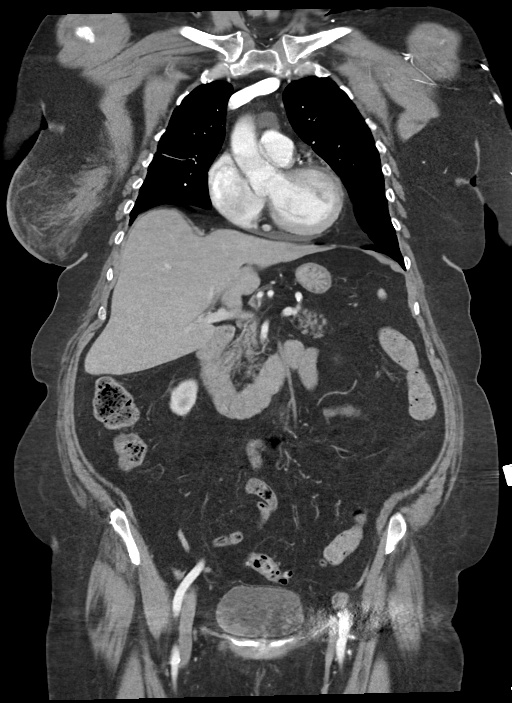
[im 54/98  soft-tissue]
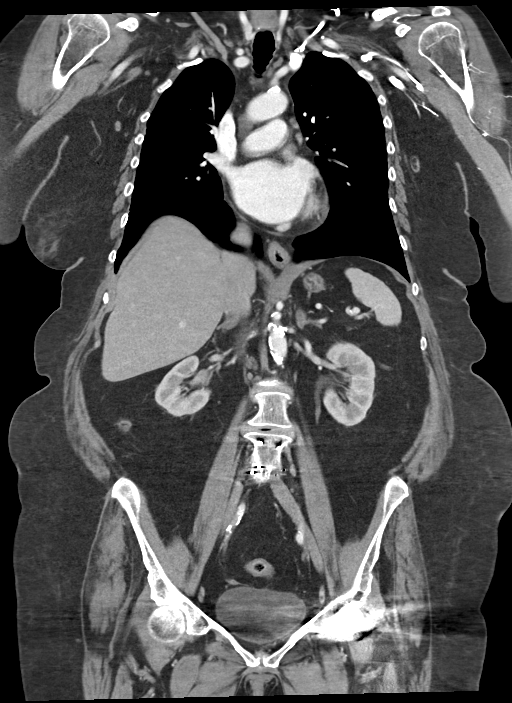

[14 of 46 positions shown; findings below may reference images not displayed]

FINDINGS: CT CHEST FINDINGS

Cardiovascular: Heart is normal size. Aorta is normal caliber.
Moderate coronary artery and aortic calcifications.

Mediastinum/Nodes: No mediastinal, hilar, or axillary adenopathy.
Trachea and esophagus are unremarkable. Thyroid unremarkable. No
mediastinal hematoma.

Lungs/Pleura: Lungs are clear. No focal airspace opacities or
suspicious nodules. No effusions. No pneumothorax

Musculoskeletal: Large hematoma in the right breast/chest wall
measuring approximately 10.6 x 3.5 cm on image 22 of series 3.

CT ABDOMEN PELVIS FINDINGS

Hepatobiliary: No hepatic injury or perihepatic hematoma.
Gallbladder is unremarkable.

Pancreas: No focal abnormality or ductal dilatation.

Spleen: No splenic injury or perisplenic hematoma.

Adrenals/Urinary Tract: No adrenal hemorrhage or renal injury
identified. Bladder is unremarkable.

Stomach/Bowel: Sigmoid diverticulosis. No active diverticulitis.
Stomach and small bowel decompressed, unremarkable.

Vascular/Lymphatic: Aortic atherosclerosis. No evidence of aneurysm
or adenopathy.

Reproductive: No mass.

Other: No free fluid or free air.

Musculoskeletal: Prior left hip replacement. Postoperative changes
in the lumbar spine is crash that postoperative and degenerative
changes in the lumbar spine. No acute bony abnormality.
IMPRESSION: Right breast/chest wall hematoma measuring 10.6 x 3.5 cm.

No acute cardiopulmonary disease.

No acute findings in the abdomen or pelvis.

## 2022-10-14 DIAGNOSIS — M1712 Unilateral primary osteoarthritis, left knee: Secondary | ICD-10-CM | POA: Diagnosis not present

## 2022-11-11 DIAGNOSIS — I788 Other diseases of capillaries: Secondary | ICD-10-CM | POA: Diagnosis not present

## 2022-11-11 DIAGNOSIS — L565 Disseminated superficial actinic porokeratosis (DSAP): Secondary | ICD-10-CM | POA: Diagnosis not present

## 2022-11-11 DIAGNOSIS — L304 Erythema intertrigo: Secondary | ICD-10-CM | POA: Diagnosis not present

## 2022-11-11 DIAGNOSIS — L570 Actinic keratosis: Secondary | ICD-10-CM | POA: Diagnosis not present

## 2022-11-11 DIAGNOSIS — L820 Inflamed seborrheic keratosis: Secondary | ICD-10-CM | POA: Diagnosis not present

## 2022-11-11 DIAGNOSIS — L738 Other specified follicular disorders: Secondary | ICD-10-CM | POA: Diagnosis not present

## 2022-11-11 DIAGNOSIS — L821 Other seborrheic keratosis: Secondary | ICD-10-CM | POA: Diagnosis not present

## 2022-11-11 DIAGNOSIS — Z85828 Personal history of other malignant neoplasm of skin: Secondary | ICD-10-CM | POA: Diagnosis not present

## 2022-11-25 ENCOUNTER — Other Ambulatory Visit: Payer: Self-pay | Admitting: Family Medicine

## 2022-11-25 DIAGNOSIS — F5102 Adjustment insomnia: Secondary | ICD-10-CM

## 2022-12-03 ENCOUNTER — Other Ambulatory Visit: Payer: Self-pay

## 2022-12-03 DIAGNOSIS — I10 Essential (primary) hypertension: Secondary | ICD-10-CM

## 2022-12-03 DIAGNOSIS — R7303 Prediabetes: Secondary | ICD-10-CM

## 2022-12-03 DIAGNOSIS — E039 Hypothyroidism, unspecified: Secondary | ICD-10-CM

## 2022-12-03 DIAGNOSIS — E782 Mixed hyperlipidemia: Secondary | ICD-10-CM

## 2022-12-05 ENCOUNTER — Encounter: Payer: Self-pay | Admitting: Family Medicine

## 2022-12-05 ENCOUNTER — Other Ambulatory Visit: Payer: PPO

## 2022-12-05 ENCOUNTER — Ambulatory Visit (INDEPENDENT_AMBULATORY_CARE_PROVIDER_SITE_OTHER): Payer: PPO | Admitting: Family Medicine

## 2022-12-05 VITALS — BP 134/81 | HR 100 | Ht 66.0 in | Wt 224.4 lb

## 2022-12-05 DIAGNOSIS — R197 Diarrhea, unspecified: Secondary | ICD-10-CM | POA: Diagnosis not present

## 2022-12-05 DIAGNOSIS — R7303 Prediabetes: Secondary | ICD-10-CM | POA: Diagnosis not present

## 2022-12-05 DIAGNOSIS — R600 Localized edema: Secondary | ICD-10-CM | POA: Insufficient documentation

## 2022-12-05 DIAGNOSIS — I10 Essential (primary) hypertension: Secondary | ICD-10-CM | POA: Diagnosis not present

## 2022-12-05 DIAGNOSIS — E039 Hypothyroidism, unspecified: Secondary | ICD-10-CM | POA: Diagnosis not present

## 2022-12-05 DIAGNOSIS — E782 Mixed hyperlipidemia: Secondary | ICD-10-CM | POA: Diagnosis not present

## 2022-12-05 NOTE — Assessment & Plan Note (Signed)
3 bowel movements in the last hour.  2 soft formed bowel movements followed by diarrhea.  Would favor food intolerance as the cause.  No fevers or chills.  No abdominal pain.  Recommend patient stay hydrated.  Discussed reasons to seek medical attention again including severe diarrhea, bloody diarrhea, fevers, severe abdominal pain.

## 2022-12-05 NOTE — Patient Instructions (Addendum)
It was nice to see you today,  We addressed the following topics today: -Your leg swelling is most likely due to venous incompetence.  The treatment for this is to wear compression stockings and try to elevate your legs when sitting down.  Compression stocking should have a rating of 20 to 30 mmHg.  Try compression stockings are available at the pharmacy.  I will also get a test called a BNP to check for heart failure as a potential cause. - For your diarrhea it is important to stay hydrated.  Drink plenty of water.  You can also get some electrolyte packets that are available at the grocery stores and mix them with the water. - If you continue to have diarrhea for several more days or the diarrhea worsens to the point where you are having 6 or more bowel movements a day or if you develop fever or severe abdominal pain let us know.  Have a great day,  Frederic Jericho, MD

## 2022-12-05 NOTE — Assessment & Plan Note (Signed)
Patient has no history of heart failure.  Does not have any signs or symptoms of heart failure other than the leg swelling.  Discussed venous incompetence as the likely cause.  Recommended compression stockings.  Will get BNP added onto the labs she was planning on getting today.

## 2022-12-05 NOTE — Progress Notes (Signed)
   Acute Office Visit  Subjective:     Patient ID: MEHREEN GUAMAN, female    DOB: March 17, 1940, 82 y.o.   MRN: [id number]  Chief Complaint  Patient presents with   Diarrhea   Joint Swelling    HPI Patient is in today for diarrhea and leg swelling.  Patient was initially here for lab testing.  While she was waiting in the lobby she had two formed bowel movements and 1 episode of diarrhea.  She asked to be seen for this and her 1 week of leg swelling.  Patient states her leg swelling has been going on for 1 week.  Has not been an issue before.  Does not get any new shortness of breath or dyspnea on exertion.  We discussed potential causes of leg swelling.  Discussed compression stockings and testing for heart failure.  Patient yesterday a bacon egg and cheese biscuit from [employer], and then at night had macaroni and cheese that she got from the store.  This morning she finished the rest of the mac & cheese.  Patient has never been diagnosed with lactose intolerance but does feel like she does not tolerate dairy very well.  Patient denies fever.  Denies abdominal pain.  Typically has a bowel movement every 3 days that is soft to hard, never diarrhea.  Has never been diagnosed with IBS in the past she states.  ROS      Objective:    BP (!) 165/80   Pulse 100   Ht 5\' 6"  (1.676 m)   Wt 224 lb 6.4 oz (101.8 kg)   SpO2 98%   BMI 36.22 kg/m    Physical Exam General: Alert, oriented CV: Regular rate and rhythm, 1/6 systolic murmur. Pulmonary: Lungs clear to auscultation bilaterally no wheeze or crackles GI: Soft, nontender.  Normal bowel sounds. Extremities: 2+ pedal edema bilaterally.  No results found for any visits on 12/05/22.      Assessment & Plan:   Leg edema Assessment & Plan: Patient has no history of heart failure.  Does not have any signs or symptoms of heart failure other than the leg swelling.  Discussed venous incompetence as the likely cause.  Recommended  compression stockings.  Will get BNP added onto the labs she was planning on getting today.  Orders: -     Brain natriuretic peptide  Acute diarrhea Assessment & Plan: 3 bowel movements in the last hour.  2 soft formed bowel movements followed by diarrhea.  Would favor food intolerance as the cause.  No fevers or chills.  No abdominal pain.  Recommend patient stay hydrated.  Discussed reasons to seek medical attention again including severe diarrhea, bloody diarrhea, fevers, severe abdominal pain.        Return if symptoms worsen or fail to improve.  Sandre Kitty, MD

## 2022-12-06 LAB — CBC WITH DIFFERENTIAL/PLATELET
Basophils Absolute: 0 10*3/uL (ref 0.0–0.2)
Basos: 0 %
EOS (ABSOLUTE): 0.2 10*3/uL (ref 0.0–0.4)
Eos: 3 %
Hematocrit: 42.6 % (ref 34.0–46.6)
Hemoglobin: 13.4 g/dL (ref 11.1–15.9)
Immature Grans (Abs): 0 10*3/uL (ref 0.0–0.1)
Immature Granulocytes: 0 %
Lymphocytes Absolute: 1.7 10*3/uL (ref 0.7–3.1)
Lymphs: 19 %
MCH: 28.9 pg (ref 26.6–33.0)
MCHC: 31.5 g/dL (ref 31.5–35.7)
MCV: 92 fL (ref 79–97)
Monocytes Absolute: 0.5 10*3/uL (ref 0.1–0.9)
Monocytes: 5 %
Neutrophils Absolute: 6.6 10*3/uL (ref 1.4–7.0)
Neutrophils: 73 %
Platelets: 395 10*3/uL (ref 150–450)
RBC: 4.63 x10E6/uL (ref 3.77–5.28)
RDW: 12.7 % (ref 11.7–15.4)
WBC: 9 10*3/uL (ref 3.4–10.8)

## 2022-12-06 LAB — TSH: TSH: 2.98 u[IU]/mL (ref 0.450–4.500)

## 2022-12-06 LAB — COMPREHENSIVE METABOLIC PANEL
ALT: 35 [IU]/L — ABNORMAL HIGH (ref 0–32)
AST: 35 [IU]/L (ref 0–40)
Albumin: 3.7 g/dL — ABNORMAL LOW (ref 3.8–4.8)
Alkaline Phosphatase: 84 [IU]/L (ref 44–121)
BUN/Creatinine Ratio: 16 (ref 12–28)
BUN: 16 mg/dL (ref 8–27)
Bilirubin Total: 0.3 mg/dL (ref 0.0–1.2)
CO2: 21 mmol/L (ref 20–29)
Calcium: 9.4 mg/dL (ref 8.7–10.3)
Chloride: 101 mmol/L (ref 96–106)
Creatinine, Ser: 1 mg/dL (ref 0.57–1.00)
Globulin, Total: 2.5 g/dL (ref 1.5–4.5)
Glucose: 186 mg/dL — ABNORMAL HIGH (ref 70–99)
Potassium: 4.1 mmol/L (ref 3.5–5.2)
Sodium: 139 mmol/L (ref 134–144)
Total Protein: 6.2 g/dL (ref 6.0–8.5)
eGFR: 57 mL/min/{1.73_m2} — ABNORMAL LOW (ref >59)

## 2022-12-06 LAB — LIPID PANEL
Chol/HDL Ratio: 3.9 {ratio} (ref 0.0–4.4)
Cholesterol, Total: 196 mg/dL (ref 100–199)
HDL: 50 mg/dL (ref >39)
LDL Chol Calc (NIH): 126 mg/dL — ABNORMAL HIGH (ref 0–99)
Triglycerides: 114 mg/dL (ref 0–149)
VLDL Cholesterol Cal: 20 mg/dL (ref 5–40)

## 2022-12-06 LAB — BRAIN NATRIURETIC PEPTIDE: BNP: 67.5 pg/mL (ref 0.0–100.0)

## 2022-12-06 LAB — HEMOGLOBIN A1C
Est. average glucose Bld gHb Est-mCnc: 126 mg/dL
Hgb A1c MFr Bld: 6 % — ABNORMAL HIGH (ref 4.8–5.6)

## 2022-12-12 ENCOUNTER — Encounter: Payer: Self-pay | Admitting: Family Medicine

## 2022-12-12 ENCOUNTER — Ambulatory Visit (INDEPENDENT_AMBULATORY_CARE_PROVIDER_SITE_OTHER): Payer: PPO | Admitting: Family Medicine

## 2022-12-12 VITALS — BP 129/69 | HR 84 | Resp 18 | Ht 66.0 in | Wt 222.0 lb

## 2022-12-12 DIAGNOSIS — F419 Anxiety disorder, unspecified: Secondary | ICD-10-CM | POA: Diagnosis not present

## 2022-12-12 DIAGNOSIS — E782 Mixed hyperlipidemia: Secondary | ICD-10-CM

## 2022-12-12 DIAGNOSIS — L089 Local infection of the skin and subcutaneous tissue, unspecified: Secondary | ICD-10-CM

## 2022-12-12 DIAGNOSIS — I10 Essential (primary) hypertension: Secondary | ICD-10-CM | POA: Diagnosis not present

## 2022-12-12 DIAGNOSIS — Z23 Encounter for immunization: Secondary | ICD-10-CM | POA: Diagnosis not present

## 2022-12-12 DIAGNOSIS — F339 Major depressive disorder, recurrent, unspecified: Secondary | ICD-10-CM

## 2022-12-12 DIAGNOSIS — E781 Pure hyperglyceridemia: Secondary | ICD-10-CM | POA: Diagnosis not present

## 2022-12-12 DIAGNOSIS — E039 Hypothyroidism, unspecified: Secondary | ICD-10-CM

## 2022-12-12 MED ORDER — OMEGA-3-ACID ETHYL ESTERS 1 G PO CAPS: 2.0000 | ORAL_CAPSULE | Freq: Two times a day (BID) | ORAL | 1 refills | Status: DC

## 2022-12-12 MED ORDER — LOSARTAN POTASSIUM 100 MG PO TABS: 100.0000 mg | ORAL_TABLET | Freq: Every day | ORAL | 1 refills | Status: DC

## 2022-12-12 MED ORDER — SIMVASTATIN 40 MG PO TABS: 40.0000 mg | ORAL_TABLET | Freq: Every day | ORAL | 3 refills | Status: DC

## 2022-12-12 MED ORDER — DULOXETINE HCL 60 MG PO CPEP: 120.0000 mg | ORAL_CAPSULE | Freq: Every day | ORAL | 1 refills | Status: DC

## 2022-12-12 MED ORDER — AMLODIPINE BESYLATE 5 MG PO TABS: 5.0000 mg | ORAL_TABLET | Freq: Every day | ORAL | 1 refills | Status: DC

## 2022-12-12 MED ORDER — SULFAMETHOXAZOLE-TRIMETHOPRIM 800-160 MG PO TABS
1.0000 | ORAL_TABLET | Freq: Two times a day (BID) | ORAL | 0 refills | Status: AC
Start: 2022-12-12 — End: 2022-12-17

## 2022-12-12 MED ORDER — LEVOTHYROXINE SODIUM 50 MCG PO TABS: 50.0000 ug | ORAL_TABLET | Freq: Every day | ORAL | 1 refills | Status: DC

## 2022-12-12 NOTE — Patient Instructions (Signed)
Continue levothyroxine 50 mcg daily.  Start a higher dose of simvastatin at 40 mg daily.

## 2022-12-12 NOTE — Assessment & Plan Note (Signed)
Last lipid panel: LDL 126, HDL 50, triglycerides 114.  Increase simvastatin to 40 mg daily, continue Lovaza 2 mg twice daily.  Will continue to monitor.

## 2022-12-12 NOTE — Progress Notes (Signed)
Established Patient Office Visit  Subjective   Patient ID: Shelly Hickman, female    DOB: December 15, 1940  Age: 82 y.o. MRN: [id number]  Chief Complaint  Patient presents with   Hand Injury    Cut on back of right hand on 12/09/22 from removing a paper jam in a paper shredder.    Hypothyroidism    HPI Shelly Hickman is a 82 y.o. female presenting today for follow up of hypothyroidism.  She also cut the back of her right hand 3 days ago removing [place] from her paper shorter.  It has been healing slowly, but is red, warm, and tender.  She has been using prescription of mupirocin ointment from several years ago. Hypothyroidism: Taking levothyroxine 50 mcg regularly in the AM away from food and vitamins. Dose was increased at last visit after three subsequent elevated TSH values. Denies fatigue, weight changes, heat/cold intolerance, skin/hair changes, bowel changes, CVS symptoms.   Outpatient Medications Prior to Visit  Medication Sig   acetaminophen (TYLENOL) 325 MG tablet Take 2 tablets (650 mg total) by mouth every 6 (six) hours as needed for mild pain (or Fever >/= 101).   aspirin EC 81 MG tablet Take 81 mg by mouth daily.   Calcium Carb-Cholecalciferol (CALCIUM 600-D PO) Take 1 tablet by mouth daily.   Cholecalciferol (VITAMIN D-3) 5000 units TABS Take 5,000 Units by mouth daily.   diphenhydrAMINE (BENADRYL) 25 mg capsule Take 25 mg by mouth every 6 (six) hours as needed.   ketoconazole (NIZORAL) 2 % cream Apply topically daily.   oxybutynin (DITROPAN-XL) 10 MG 24 hr tablet Take 1 tablet (10 mg total) by mouth at bedtime.   traZODone (DESYREL) 100 MG tablet TAKE 1 TABLET BY MOUTH AT BEDTIME FOR SLEEP   [DISCONTINUED] amLODipine (NORVASC) 5 MG tablet Take 1 tablet (5 mg total) by mouth daily.   [DISCONTINUED] DULoxetine (CYMBALTA) 60 MG capsule TAKE TWO CAPSULES BY MOUTH DAILY   [DISCONTINUED] levothyroxine (SYNTHROID) 50 MCG tablet Take 1 tablet (50 mcg total) by mouth daily  before breakfast.   [DISCONTINUED] losartan (COZAAR) 100 MG tablet Take 1 tablet (100 mg total) by mouth daily.   [DISCONTINUED] omega-3 acid ethyl esters (LOVAZA) 1 g capsule TAKE 2 CAPSULES BY MOUTH TWICE DAILY   [DISCONTINUED] simvastatin (ZOCOR) 20 MG tablet TAKE 1 TABLET BY MOUTH AT BEDTIME   No facility-administered medications prior to visit.    ROS Negative unless otherwise noted in HPI   Objective:     BP 129/69 (BP Location: Left Arm, Patient Position: Sitting, Cuff Size: Normal)   Pulse 84   Resp 18   Ht 5\' 6"  (1.676 m)   Wt 222 lb (100.7 kg)   SpO2 95%   BMI 35.83 kg/m   Physical Exam Constitutional:      General: She is not in acute distress.    Appearance: Normal appearance.  HENT:     Head: Normocephalic and atraumatic.  Cardiovascular:     Rate and Rhythm: Normal rate and regular rhythm.     Heart sounds: No murmur heard.    No friction rub. No gallop.  Pulmonary:     Effort: Pulmonary effort is normal. No respiratory distress.     Breath sounds: No wheezing, rhonchi or rales.  Skin:    General: Skin is warm and dry.     Findings: Abrasion present.     Comments: 1.5 cm abrasion on dorsal lateral aspect of right hand.  Surrounding erythema, warm and tender  to touch.  Neurological:     Mental Status: She is alert and oriented to person, place, and time.     Assessment & Plan:  Acquired hypothyroidism Assessment & Plan: TSH within normal limits after increasing dose.  Continue levothyroxine 50 mcg daily.  Orders: -     Levothyroxine Sodium; Take 1 tablet (50 mcg total) by mouth daily before breakfast.  Dispense: 90 tablet; Refill: 1  Need for influenza vaccination -     Flu Vaccine Trivalent High Dose (Fluad)  Mixed hyperlipidemia Assessment & Plan: Last lipid panel: LDL 126, HDL 50, triglycerides 114.  Increase simvastatin to 40 mg daily, continue Lovaza 2 mg twice daily.  Will continue to monitor.  Orders: -     Simvastatin; Take 1 tablet (40  mg total) by mouth at bedtime.  Dispense: 90 tablet; Refill: 3 -     Omega-3-acid Ethyl Esters; Take 2 capsules (2 g total) by mouth 2 (two) times daily.  Dispense: 360 capsule; Refill: 1  Anxiety Assessment & Plan: Continue Cymbalta 120 mg daily, trazodone 100 mg daily at bedtime.  Orders: -     DULoxetine HCl; Take 2 capsules (120 mg total) by mouth daily.  Dispense: 180 capsule; Refill: 1  Depression, recurrent (HCC) -     DULoxetine HCl; Take 2 capsules (120 mg total) by mouth daily.  Dispense: 180 capsule; Refill: 1  Benign hypertension Assessment & Plan: BP goal <130/80. Stable.  Continue losartan 100 mg daily.  Last CMP within normal limits.  Will continue to monitor.  Orders: -     amLODIPine Besylate; Take 1 tablet (5 mg total) by mouth daily.  Dispense: 90 tablet; Refill: 1 -     Losartan Potassium; Take 1 tablet (100 mg total) by mouth daily.  Dispense: 90 tablet; Refill: 1  Hypertriglyceridemia -     Omega-3-acid Ethyl Esters; Take 2 capsules (2 g total) by mouth 2 (two) times daily.  Dispense: 360 capsule; Refill: 1  Skin infection -     Sulfamethoxazole-Trimethoprim; Take 1 tablet by mouth 2 (two) times daily for 5 days.  Dispense: 10 tablet; Refill: 0  Given presence of erythema and warmth in addition to immunocompromised state due to diabetes, starting 5-day course of Bactrim for treatment of hand injury.  Return in about 4 months (around 04/14/2023) for follow-up for HTN, HLD, DM, thyroid, fasting blood work 1 week before.    Melida Quitter, PA

## 2022-12-12 NOTE — Assessment & Plan Note (Signed)
BP goal <130/80. Stable.  Continue losartan 100 mg daily.  Last CMP within normal limits.  Will continue to monitor.

## 2022-12-12 NOTE — Assessment & Plan Note (Signed)
Continue Cymbalta 120 mg daily, trazodone 100 mg daily at bedtime.

## 2022-12-12 NOTE — Assessment & Plan Note (Signed)
TSH within normal limits after increasing dose.  Continue levothyroxine 50 mcg daily.

## 2022-12-18 DIAGNOSIS — D3132 Benign neoplasm of left choroid: Secondary | ICD-10-CM | POA: Diagnosis not present

## 2022-12-18 DIAGNOSIS — M47816 Spondylosis without myelopathy or radiculopathy, lumbar region: Secondary | ICD-10-CM | POA: Diagnosis not present

## 2022-12-18 DIAGNOSIS — H02831 Dermatochalasis of right upper eyelid: Secondary | ICD-10-CM | POA: Diagnosis not present

## 2022-12-18 DIAGNOSIS — H02834 Dermatochalasis of left upper eyelid: Secondary | ICD-10-CM | POA: Diagnosis not present

## 2022-12-18 DIAGNOSIS — Z961 Presence of intraocular lens: Secondary | ICD-10-CM | POA: Diagnosis not present

## 2022-12-18 DIAGNOSIS — H04123 Dry eye syndrome of bilateral lacrimal glands: Secondary | ICD-10-CM | POA: Diagnosis not present

## 2022-12-18 DIAGNOSIS — M545 Low back pain, unspecified: Secondary | ICD-10-CM | POA: Diagnosis not present

## 2022-12-23 DIAGNOSIS — M545 Low back pain, unspecified: Secondary | ICD-10-CM | POA: Diagnosis not present

## 2022-12-23 DIAGNOSIS — M47816 Spondylosis without myelopathy or radiculopathy, lumbar region: Secondary | ICD-10-CM | POA: Diagnosis not present

## 2022-12-25 DIAGNOSIS — M47816 Spondylosis without myelopathy or radiculopathy, lumbar region: Secondary | ICD-10-CM | POA: Diagnosis not present

## 2022-12-25 DIAGNOSIS — M545 Low back pain, unspecified: Secondary | ICD-10-CM | POA: Diagnosis not present

## 2022-12-30 DIAGNOSIS — M47816 Spondylosis without myelopathy or radiculopathy, lumbar region: Secondary | ICD-10-CM | POA: Diagnosis not present

## 2022-12-30 DIAGNOSIS — M545 Low back pain, unspecified: Secondary | ICD-10-CM | POA: Diagnosis not present

## 2023-01-01 DIAGNOSIS — M545 Low back pain, unspecified: Secondary | ICD-10-CM | POA: Diagnosis not present

## 2023-01-01 DIAGNOSIS — M47816 Spondylosis without myelopathy or radiculopathy, lumbar region: Secondary | ICD-10-CM | POA: Diagnosis not present

## 2023-01-06 DIAGNOSIS — M47816 Spondylosis without myelopathy or radiculopathy, lumbar region: Secondary | ICD-10-CM | POA: Diagnosis not present

## 2023-01-06 DIAGNOSIS — M545 Low back pain, unspecified: Secondary | ICD-10-CM | POA: Diagnosis not present

## 2023-01-08 DIAGNOSIS — M545 Low back pain, unspecified: Secondary | ICD-10-CM | POA: Diagnosis not present

## 2023-01-08 DIAGNOSIS — M47816 Spondylosis without myelopathy or radiculopathy, lumbar region: Secondary | ICD-10-CM | POA: Diagnosis not present

## 2023-01-15 DIAGNOSIS — M47816 Spondylosis without myelopathy or radiculopathy, lumbar region: Secondary | ICD-10-CM | POA: Diagnosis not present

## 2023-01-15 DIAGNOSIS — M545 Low back pain, unspecified: Secondary | ICD-10-CM | POA: Diagnosis not present

## 2023-01-20 DIAGNOSIS — M1712 Unilateral primary osteoarthritis, left knee: Secondary | ICD-10-CM | POA: Diagnosis not present

## 2023-01-20 DIAGNOSIS — M47816 Spondylosis without myelopathy or radiculopathy, lumbar region: Secondary | ICD-10-CM | POA: Diagnosis not present

## 2023-01-20 DIAGNOSIS — M545 Low back pain, unspecified: Secondary | ICD-10-CM | POA: Diagnosis not present

## 2023-01-24 DIAGNOSIS — M1712 Unilateral primary osteoarthritis, left knee: Secondary | ICD-10-CM | POA: Diagnosis not present

## 2023-01-29 DIAGNOSIS — M1712 Unilateral primary osteoarthritis, left knee: Secondary | ICD-10-CM | POA: Diagnosis not present

## 2023-02-07 DIAGNOSIS — M1712 Unilateral primary osteoarthritis, left knee: Secondary | ICD-10-CM | POA: Diagnosis not present

## 2023-02-20 ENCOUNTER — Other Ambulatory Visit: Payer: Self-pay | Admitting: Family Medicine

## 2023-02-20 DIAGNOSIS — F5102 Adjustment insomnia: Secondary | ICD-10-CM

## 2023-03-05 ENCOUNTER — Other Ambulatory Visit: Payer: Self-pay | Admitting: Family Medicine

## 2023-03-05 DIAGNOSIS — E782 Mixed hyperlipidemia: Secondary | ICD-10-CM

## 2023-03-27 ENCOUNTER — Encounter: Payer: Self-pay | Admitting: Family Medicine

## 2023-03-27 ENCOUNTER — Other Ambulatory Visit: Payer: Self-pay

## 2023-03-27 DIAGNOSIS — I10 Essential (primary) hypertension: Secondary | ICD-10-CM

## 2023-03-27 DIAGNOSIS — R7303 Prediabetes: Secondary | ICD-10-CM

## 2023-03-27 DIAGNOSIS — E782 Mixed hyperlipidemia: Secondary | ICD-10-CM

## 2023-04-07 ENCOUNTER — Other Ambulatory Visit: Payer: PPO

## 2023-04-09 ENCOUNTER — Other Ambulatory Visit: Payer: PPO

## 2023-04-09 DIAGNOSIS — I10 Essential (primary) hypertension: Secondary | ICD-10-CM

## 2023-04-09 DIAGNOSIS — E782 Mixed hyperlipidemia: Secondary | ICD-10-CM

## 2023-04-09 DIAGNOSIS — R7303 Prediabetes: Secondary | ICD-10-CM

## 2023-04-10 ENCOUNTER — Encounter: Payer: Self-pay | Admitting: Family Medicine

## 2023-04-10 LAB — COMPREHENSIVE METABOLIC PANEL
ALT: 23 [IU]/L (ref 0–32)
AST: 24 [IU]/L (ref 0–40)
Albumin: 4.1 g/dL (ref 3.8–4.8)
Alkaline Phosphatase: 79 [IU]/L (ref 44–121)
BUN/Creatinine Ratio: 22 (ref 12–28)
BUN: 20 mg/dL (ref 8–27)
Bilirubin Total: 0.3 mg/dL (ref 0.0–1.2)
CO2: 22 mmol/L (ref 20–29)
Calcium: 9.4 mg/dL (ref 8.7–10.3)
Chloride: 100 mmol/L (ref 96–106)
Creatinine, Ser: 0.9 mg/dL (ref 0.57–1.00)
Globulin, Total: 2.7 g/dL (ref 1.5–4.5)
Glucose: 118 mg/dL — ABNORMAL HIGH (ref 70–99)
Potassium: 4.3 mmol/L (ref 3.5–5.2)
Sodium: 136 mmol/L (ref 134–144)
Total Protein: 6.8 g/dL (ref 6.0–8.5)
eGFR: 65 mL/min/{1.73_m2} (ref >59)

## 2023-04-10 LAB — LIPID PANEL
Chol/HDL Ratio: 3.9 {ratio} (ref 0.0–4.4)
Cholesterol, Total: 174 mg/dL (ref 100–199)
HDL: 45 mg/dL (ref >39)
LDL Chol Calc (NIH): 106 mg/dL — ABNORMAL HIGH (ref 0–99)
Triglycerides: 127 mg/dL (ref 0–149)
VLDL Cholesterol Cal: 23 mg/dL (ref 5–40)

## 2023-04-10 LAB — HEMOGLOBIN A1C
Est. average glucose Bld gHb Est-mCnc: 137 mg/dL
Hgb A1c MFr Bld: 6.4 % — ABNORMAL HIGH (ref 4.8–5.6)

## 2023-04-14 ENCOUNTER — Ambulatory Visit (INDEPENDENT_AMBULATORY_CARE_PROVIDER_SITE_OTHER): Payer: PPO | Admitting: Family Medicine

## 2023-04-14 ENCOUNTER — Encounter: Payer: Self-pay | Admitting: Family Medicine

## 2023-04-14 VITALS — BP 123/77 | HR 92 | Temp 97.6°F | Ht 66.0 in | Wt 227.0 lb

## 2023-04-14 DIAGNOSIS — R7303 Prediabetes: Secondary | ICD-10-CM

## 2023-04-14 DIAGNOSIS — F32A Depression, unspecified: Secondary | ICD-10-CM

## 2023-04-14 DIAGNOSIS — E782 Mixed hyperlipidemia: Secondary | ICD-10-CM | POA: Diagnosis not present

## 2023-04-14 DIAGNOSIS — E039 Hypothyroidism, unspecified: Secondary | ICD-10-CM

## 2023-04-14 DIAGNOSIS — Z01818 Encounter for other preprocedural examination: Secondary | ICD-10-CM

## 2023-04-14 DIAGNOSIS — I10 Essential (primary) hypertension: Secondary | ICD-10-CM | POA: Diagnosis not present

## 2023-04-14 NOTE — Assessment & Plan Note (Addendum)
 TSH within normal limits after increasing dose.  Continue levothyroxine 50 mcg daily.

## 2023-04-14 NOTE — Progress Notes (Signed)
 Established Patient Office Visit  Subjective   Patient ID: Shelly Hickman, female    DOB: 02/22/1940  Age: 83 y.o. MRN: [id number]  Chief Complaint  Patient presents with   Follow-up    HPI Shelly Hickman is a 83 y.o. female presenting today for follow up of hypertension, hyperlipidemia, prediabetes, hypothyroidism.  She is also scheduled for left total knee replacement on 05/13/2023 and will need surgical clearance. Hypertension: Pt denies chest pain, SOB, dizziness, edema, syncope, fatigue or heart palpitations. Taking amlodipine and losartan, reports excellent compliance with treatment. Denies side effects. Hyperlipidemia: tolerating simvastatin well with no myalgias or significant side effects.  At last appointment, LDL had increased so recommended increasing dose of simvastatin to 40 mg daily.  She reports that she has only been taking half of this dose regularly for the last couple of weeks.   The ASCVD Risk score (Arnett DK, et al., 2019) failed to calculate for the following reasons:   The 2019 ASCVD risk score is only valid for ages 68 to 42 Prediabetes: denies hypoglycemic events, wounds or sores that are not healing well, increased thirst or urination.  Hypothyroidism: Taking levothyroxine regularly in the AM away from food and vitamins. Denies fatigue, weight changes, heat/cold intolerance, skin/hair changes, bowel changes, CVS symptoms.   Outpatient Medications Prior to Visit  Medication Sig   acetaminophen (TYLENOL) 325 MG tablet Take 2 tablets (650 mg total) by mouth every 6 (six) hours as needed for mild pain (or Fever >/= 101).   amLODipine (NORVASC) 5 MG tablet Take 1 tablet (5 mg total) by mouth daily.   aspirin EC 81 MG tablet Take 81 mg by mouth daily.   Calcium Carb-Cholecalciferol (CALCIUM 600-D PO) Take 1 tablet by mouth daily.   Cholecalciferol (VITAMIN D-3) 5000 units TABS Take 5,000 Units by mouth daily.   diphenhydrAMINE (BENADRYL) 25 mg capsule Take 25  mg by mouth every 6 (six) hours as needed.   DULoxetine (CYMBALTA) 60 MG capsule Take 2 capsules (120 mg total) by mouth daily.   ketoconazole (NIZORAL) 2 % cream Apply topically daily.   levothyroxine (SYNTHROID) 50 MCG tablet Take 1 tablet (50 mcg total) by mouth daily before breakfast. (Patient taking differently: Take 25 mcg by mouth daily before breakfast.)   losartan (COZAAR) 100 MG tablet Take 1 tablet (100 mg total) by mouth daily.   omega-3 acid ethyl esters (LOVAZA) 1 g capsule Take 2 capsules (2 g total) by mouth 2 (two) times daily.   oxybutynin (DITROPAN-XL) 10 MG 24 hr tablet Take 1 tablet (10 mg total) by mouth at bedtime.   simvastatin (ZOCOR) 40 MG tablet Take 1 tablet (40 mg total) by mouth at bedtime. (Patient taking differently: Take 20 mg by mouth at bedtime.)   traZODone (DESYREL) 100 MG tablet TAKE 1 TABLET BY MOUTH AT BEDTIME FOR SLEEP   No facility-administered medications prior to visit.    ROS Negative unless otherwise noted in HPI   Objective:     BP 123/77   Pulse 92   Temp 97.6 F (36.4 C) (Temporal)   Ht 5\' 6"  (1.676 m)   Wt 227 lb (103 kg)   SpO2 94%   BMI 36.64 kg/m   Physical Exam Constitutional:      General: She is not in acute distress.    Appearance: Normal appearance.  HENT:     Head: Normocephalic and atraumatic.  Cardiovascular:     Rate and Rhythm: Normal rate and regular rhythm.  Heart sounds: No murmur heard.    No friction rub. No gallop.  Pulmonary:     Effort: Pulmonary effort is normal. No respiratory distress.     Breath sounds: No wheezing, rhonchi or rales.  Skin:    General: Skin is warm and dry.  Neurological:     Mental Status: She is alert and oriented to person, place, and time.   Predictors of intubation difficulty:  Morbid obesity? yes - BMI 36.64 with HTN, HLD, preDM  Anatomically abnormal facies? no  Prominent incisors? no  Short, thick neck? no  Neck range of motion: normal  Mallampati score: II (hard  and soft palate, upper portion of tonsils anduvula visible)  Dentition: No chipped, loose, or missing teeth.   Assessment & Plan:  Benign hypertension Assessment & Plan: BP goal <130/80. Stable, at goal.  Continue amlodipine 5 mg daily, losartan 100 mg daily.  Last CMP within normal limits.  Will continue to monitor.   Mixed hyperlipidemia Assessment & Plan: Last lipid panel: LDL 106, HDL 45, triglycerides 127.  Continue simvastatin 40 mg daily, Lovaza 2 mg twice daily.  Reassured patient that current dose of simvastatin is safe to take with her other medications, particularly since she denies any side effects with the increased dose.  Will continue to monitor.   Prediabetes Assessment & Plan: A1c 6.4, significant increase from last check.  Will continue to monitor.  Continue aiming to get regular exercise and limit intake of foods and drinks high in carbs/sugar.   Acquired hypothyroidism Assessment & Plan: TSH within normal limits after increasing dose.  Continue levothyroxine 50 mcg daily.   Depression, unspecified depression type Assessment & Plan: She feels that her depression has worsened due to her knee pain and its impact on her ability to stay active.  She would like to maintain her current medication routine for now and reevaluate after her knee replacement surgery.   Preop examination  Reviewed current medications and dosages.  Provided patient printed information as well to compare to her medication bottles when she gets home to ensure that she has the correct prescriptions.  Total knee replacement 83 y.o. female with planned surgery as above.   Known risk factors for perioperative complications: None   Difficulty with intubation is not anticipated. Risk Estimation:  ASA II  Return in about 3 months (around 07/12/2023) for follow-up for mood.    Melida Quitter, PA

## 2023-04-14 NOTE — Patient Instructions (Addendum)
 When you get home, double check that all of your bottles are for the correct doses of medication!  CURRENT MEDICATIONS: -Levothyroxine 50 mcg daily for thyroid -Losartan 100 mg daily and amlodipine 5 mg daily for blood pressure -Simvastatin 40 mg daily and Lovaza 2 g (2 capsules) twice daily for cholesterol -Duloxetine 120 mg (2 capsules) daily for mood -Trazodone 100 mg nightly at bedtime for sleep

## 2023-04-14 NOTE — Assessment & Plan Note (Signed)
 She feels that her depression has worsened due to her knee pain and its impact on her ability to stay active.  She would like to maintain her current medication routine for now and reevaluate after her knee replacement surgery.

## 2023-04-14 NOTE — Assessment & Plan Note (Addendum)
 BP goal <130/80. Stable, at goal.  Continue amlodipine 5 mg daily, losartan 100 mg daily.  Last CMP within normal limits.  Will continue to monitor.

## 2023-04-14 NOTE — Assessment & Plan Note (Addendum)
 Last lipid panel: LDL 106, HDL 45, triglycerides 127.  Continue simvastatin 40 mg daily, Lovaza 2 mg twice daily.  Reassured patient that current dose of simvastatin is safe to take with her other medications, particularly since she denies any side effects with the increased dose.  Will continue to monitor.

## 2023-04-14 NOTE — Assessment & Plan Note (Signed)
 A1c 6.4, significant increase from last check.  Will continue to monitor.  Continue aiming to get regular exercise and limit intake of foods and drinks high in carbs/sugar.

## 2023-04-24 NOTE — Progress Notes (Signed)
 Sent message, via epic in basket, requesting orders in epic from [facility].

## 2023-04-28 ENCOUNTER — Other Ambulatory Visit: Payer: Self-pay | Admitting: Family Medicine

## 2023-04-28 DIAGNOSIS — L304 Erythema intertrigo: Secondary | ICD-10-CM

## 2023-04-28 MED ORDER — KETOCONAZOLE 2 % EX CREA
TOPICAL_CREAM | Freq: Every day | CUTANEOUS | 2 refills | Status: AC
Start: 2023-04-28 — End: ?

## 2023-04-28 NOTE — Patient Instructions (Signed)
 SURGICAL WAITING ROOM VISITATION  Patients having surgery or a procedure may have no more than 2 support people in the waiting area - these visitors may rotate.    Children under the age of 22 must have an adult with them who is not the patient.  Due to an increase in RSV and influenza rates and associated hospitalizations, children ages 62 and under may not visit patients in [facility] hospitals.  Visitors with respiratory illnesses are discouraged from visiting and should remain at home.  If the patient needs to stay at the hospital during part of their recovery, the visitor guidelines for inpatient rooms apply. Pre-op nurse will coordinate an appropriate time for 1 support person to accompany patient in pre-op.  This support person may not rotate.    Please refer to the [facility] website for the visitor guidelines for Inpatients (after your surgery is over and you are in a regular room).       Your procedure is scheduled on:  05/13/23    Report to [facility] Main Entrance    Report to admitting at  0515 AM   Call this number if you have problems the morning of surgery [phone]   Do not eat food :After Midnight.   After Midnight you may have the following liquids until _ 0430_____ AM DAY OF SURGERY  Water Non-Citrus Juices (without pulp, NO RED-Apple, White grape, White cranberry) Black Coffee (NO MILK/CREAM OR CREAMERS, sugar ok)  Clear Tea (NO MILK/CREAM OR CREAMERS, sugar ok) regular and decaf                             Plain Jell-O (NO RED)                                           Fruit ices (not with fruit pulp, NO RED)                                     Popsicles (NO RED)                                                               Sports drinks like Gatorade (NO RED)                   The day of surgery:  Drink ONE (1) Pre-Surgery Clear Ensure or G2 at 0430 AM ( have completed by )  the morning of surgery. Drink in one sitting. Do not sip.  This  drink was given to you during your hospital  pre-op appointment visit. Nothing else to drink after completing the  Pre-Surgery Clear Ensure or G2.          If you have questions, please contact your surgeon's office.       Oral Hygiene is also important to reduce your risk of infection.  Remember - BRUSH YOUR TEETH THE MORNING OF SURGERY WITH YOUR REGULAR TOOTHPASTE  DENTURES WILL BE REMOVED PRIOR TO SURGERY PLEASE DO NOT APPLY "Poly grip" OR ADHESIVES!!!   Do NOT smoke after Midnight   Stop all vitamins and herbal supplements 7 days before surgery.   Take these medicines the morning of surgery with A SIP OF WATER:  amlodinpoine, cymbalta, synthroid   DO NOT TAKE ANY ORAL DIABETIC MEDICATIONS DAY OF YOUR SURGERY  Bring CPAP mask and tubing day of surgery.                              You may not have any metal on your body including hair pins, jewelry, and body piercing             Do not wear make-up, lotions, powders, perfumes/cologne, or deodorant  Do not wear nail polish including gel and S&S, artificial/acrylic nails, or any other type of covering on natural nails including finger and toenails. If you have artificial nails, gel coating, etc. that needs to be removed by a nail salon please have this removed prior to surgery or surgery may need to be canceled/ delayed if the surgeon/ anesthesia feels like they are unable to be safely monitored.   Do not shave  48 hours prior to surgery.               Men may shave face and neck.   Do not bring valuables to the hospital. [facility] IS NOT             RESPONSIBLE   FOR VALUABLES.   Contacts, glasses, dentures or bridgework may not be worn into surgery.   Bring small overnight bag day of surgery.   DO NOT BRING YOUR HOME MEDICATIONS TO THE HOSPITAL. PHARMACY WILL DISPENSE MEDICATIONS LISTED ON YOUR MEDICATION LIST TO YOU DURING YOUR ADMISSION IN THE HOSPITAL!    Patients discharged on  the day of surgery will not be allowed to drive home.  Someone NEEDS to stay with you for the first 24 hours after anesthesia.   Special Instructions: Bring a copy of your healthcare power of attorney and living will documents the day of surgery if you haven't scanned them before.              Please read over the following fact sheets you were given: IF YOU HAVE QUESTIONS ABOUT YOUR PRE-OP INSTRUCTIONS PLEASE CALL [phone]   If you received a COVID test during your pre-op visit  it is requested that you wear a mask when out in public, stay away from anyone that may not be feeling well and notify your surgeon if you develop symptoms. If you test positive for Covid or have been in contact with anyone that has tested positive in the last 10 days please notify you surgeon.      Pre-operative 5 CHG Bath Instructions   You can play a key role in reducing the risk of infection after surgery. Your skin needs to be as free of germs as possible. You can reduce the number of germs on your skin by washing with CHG (chlorhexidine gluconate) soap before surgery. CHG is an antiseptic soap that kills germs and continues to kill germs even after washing.   DO NOT use if you have an allergy to chlorhexidine/CHG or antibacterial soaps. If your skin becomes reddened or irritated, stop using the CHG and notify one of our  RNs at [phone].   Please shower with the CHG soap starting 4 days before surgery using the following schedule:     Please keep in mind the following:  DO NOT shave, including legs and underarms, starting the day of your first shower.   You may shave your face at any point before/day of surgery.  Place clean sheets on your bed the day you start using CHG soap. Use a clean washcloth (not used since being washed) for each shower. DO NOT sleep with pets once you start using the CHG.   CHG Shower Instructions:  If you choose to wash your hair and private area, wash first with your  normal shampoo/soap.  After you use shampoo/soap, rinse your hair and body thoroughly to remove shampoo/soap residue.  Turn the water OFF and apply about 3 tablespoons (45 ml) of CHG soap to a CLEAN washcloth.  Apply CHG soap ONLY FROM YOUR NECK DOWN TO YOUR TOES (washing for 3-5 minutes)  DO NOT use CHG soap on face, private areas, open wounds, or sores.  Pay special attention to the area where your surgery is being performed.  If you are having back surgery, having someone wash your back for you may be helpful. Wait 2 minutes after CHG soap is applied, then you may rinse off the CHG soap.  Pat dry with a clean towel  Put on clean clothes/pajamas   If you choose to wear lotion, please use ONLY the CHG-compatible lotions on the back of this paper.     Additional instructions for the day of surgery: DO NOT APPLY any lotions, deodorants, cologne, or perfumes.   Put on clean/comfortable clothes.  Brush your teeth.  Ask your nurse before applying any prescription medications to the skin.      CHG Compatible Lotions   Aveeno Moisturizing lotion  Cetaphil Moisturizing Cream  Cetaphil Moisturizing Lotion  Clairol Herbal Essence Moisturizing Lotion, Dry Skin  Clairol Herbal Essence Moisturizing Lotion, Extra Dry Skin  Clairol Herbal Essence Moisturizing Lotion, Normal Skin  Curel Age Defying Therapeutic Moisturizing Lotion with Alpha Hydroxy  Curel Extreme Care Body Lotion  Curel Soothing Hands Moisturizing Hand Lotion  Curel Therapeutic Moisturizing Cream, Fragrance-Free  Curel Therapeutic Moisturizing Lotion, Fragrance-Free  Curel Therapeutic Moisturizing Lotion, Original Formula  Eucerin Daily Replenishing Lotion  Eucerin Dry Skin Therapy Plus Alpha Hydroxy Crme  Eucerin Dry Skin Therapy Plus Alpha Hydroxy Lotion  Eucerin Original Crme  Eucerin Original Lotion  Eucerin Plus Crme Eucerin Plus Lotion  Eucerin TriLipid Replenishing Lotion  Keri Anti-Bacterial Hand Lotion  Keri  Deep Conditioning Original Lotion Dry Skin Formula Softly Scented  Keri Deep Conditioning Original Lotion, Fragrance Free Sensitive Skin Formula  Keri Lotion Fast Absorbing Fragrance Free Sensitive Skin Formula  Keri Lotion Fast Absorbing Softly Scented Dry Skin Formula  Keri Original Lotion  Keri Skin Renewal Lotion Keri Silky Smooth Lotion  Keri Silky Smooth Sensitive Skin Lotion  Nivea Body Creamy Conditioning Oil  Nivea Body Extra Enriched Teacher, adult education Moisturizing Lotion Nivea Crme  Nivea Skin Firming Lotion  NutraDerm 30 Skin Lotion  NutraDerm Skin Lotion  NutraDerm Therapeutic Skin Cream  NutraDerm Therapeutic Skin Lotion  ProShield Protective Hand Cream  Provon moisturizing lotion

## 2023-04-28 NOTE — Progress Notes (Addendum)
 Anesthesia Review:  PCP: Edstrom LOV 04/18/22  Cardiologist : none   PPM/ ICD: Device Orders: Rep Notified:  Chest x-ray : EKG : 04/30/23  Echo : Stress test: Cardiac Cath :   Activity level: can do a flight of stairs without difficutoy  Sleep Study/ CPAP : none  Fasting Blood Sugar :      / Checks Blood Sugar -- times a day:    Blood Thinner/ Instructions /Last Dose: ASA / Instructions/ Last Dose :    81 mg aspirin    04/09/23-hgba1c- 6.4    Take blood pressure in lower arm .   Hearing aids - does not wear    PT to call preop nurse back with name of Estrogen cream she has been placed on

## 2023-04-30 ENCOUNTER — Encounter (HOSPITAL_COMMUNITY)
Admission: RE | Admit: 2023-04-30 | Discharge: 2023-04-30 | Disposition: A | Discharge status: Home / Self Care | Payer: PPO | Source: Ambulatory Visit | Attending: Orthopedic Surgery | Admitting: Orthopedic Surgery

## 2023-04-30 ENCOUNTER — Encounter (HOSPITAL_COMMUNITY): Payer: Self-pay

## 2023-04-30 ENCOUNTER — Other Ambulatory Visit: Payer: Self-pay

## 2023-04-30 VITALS — BP 174/79 | HR 81 | Temp 98.6°F | Resp 16 | Ht 66.0 in | Wt 224.9 lb

## 2023-04-30 DIAGNOSIS — Z01810 Encounter for preprocedural cardiovascular examination: Secondary | ICD-10-CM | POA: Diagnosis present

## 2023-04-30 DIAGNOSIS — Z01818 Encounter for other preprocedural examination: Secondary | ICD-10-CM | POA: Diagnosis not present

## 2023-04-30 DIAGNOSIS — Z01812 Encounter for preprocedural laboratory examination: Secondary | ICD-10-CM | POA: Diagnosis present

## 2023-04-30 HISTORY — DX: Gastro-esophageal reflux disease without esophagitis: K21.9

## 2023-04-30 HISTORY — DX: Hypothyroidism, unspecified: E03.9

## 2023-04-30 LAB — BASIC METABOLIC PANEL
Anion gap: 9 (ref 5–15)
BUN: 19 mg/dL (ref 8–23)
CO2: 25 mmol/L (ref 22–32)
Calcium: 9.3 mg/dL (ref 8.9–10.3)
Chloride: 101 mmol/L (ref 98–111)
Creatinine, Ser: 0.76 mg/dL (ref 0.44–1.00)
GFR, Estimated: >60 mL/min (ref >60)
Glucose, Bld: 115 mg/dL — ABNORMAL HIGH (ref 70–99)
Potassium: 4.2 mmol/L (ref 3.5–5.1)
Sodium: 135 mmol/L (ref 135–145)

## 2023-04-30 LAB — CBC
HCT: 43.5 % (ref 36.0–46.0)
Hemoglobin: 13.8 g/dL (ref 12.0–15.0)
MCH: 28.7 pg (ref 26.0–34.0)
MCHC: 31.7 g/dL (ref 30.0–36.0)
MCV: 90.4 fL (ref 80.0–100.0)
Platelets: 344 10*3/uL (ref 150–400)
RBC: 4.81 MIL/uL (ref 3.87–5.11)
RDW: 13.7 % (ref 11.5–15.5)
WBC: 10.8 10*3/uL — ABNORMAL HIGH (ref 4.0–10.5)
nRBC: 0 % (ref 0.0–0.2)

## 2023-04-30 LAB — SURGICAL PCR SCREEN
MRSA, PCR: NEGATIVE
Staphylococcus aureus: NEGATIVE

## 2023-04-30 NOTE — Care Plan (Signed)
 Ortho Bundle Case Management Note  Patient Details  Name: Shelly Hickman MRN: [id number] Date of Birth: 1940/11/20  met with patient in the office for H&P. will discharge to home with family to assist. has rolling walker. would like CPM. Tora Kindred, PT to see at home for 4 visits prior to starting in the clinic at [facility]. discharge instructions discussed and questions answered. Patient and MD in agreement with plan. Choice offered.                    DME Arranged:  CPM DME Agency:  [employer]  HH Arranged:    HH Agency:     Additional Comments: Please contact me with any questions of if this plan should need to change.  Shauna Hugh,  RN,BSN,MHA,CCM  [facility] Orthopaedic Specialist  [phone] 04/30/2023, 4:06 PM

## 2023-05-01 NOTE — Anesthesia Preprocedure Evaluation (Addendum)
 Anesthesia Evaluation  Patient identified by MRN, date of birth, ID band Patient awake    Reviewed: Allergy & Precautions, NPO status , Patient's Chart, lab work & pertinent test results  Airway Mallampati: III  TM Distance: >3 FB Neck ROM: Full    Dental  (+) Dental Advisory Given, Missing   Pulmonary pneumonia, former smoker   Pulmonary exam normal breath sounds clear to auscultation       Cardiovascular hypertension, Pt. on medications Normal cardiovascular exam Rhythm:Regular Rate:Normal     Neuro/Psych  PSYCHIATRIC DISORDERS Anxiety Depression     Neuromuscular disease negative neurological ROS     GI/Hepatic Neg liver ROS, hiatal hernia,GERD  ,,  Endo/Other  Hypothyroidism    Renal/GU negative Renal ROS     Musculoskeletal negative musculoskeletal ROS (+) Arthritis ,    Abdominal  (+) + obese  Peds  Hematology negative hematology ROS (+)   Anesthesia Other Findings   Reproductive/Obstetrics                             Anesthesia Physical Anesthesia Plan  ASA: 2  Anesthesia Plan: General   Post-op Pain Management: Tylenol PO (pre-op)* and Regional block*   Induction: Intravenous  PONV Risk Score and Plan: 4 or greater and TIVA, Treatment may vary due to age or medical condition, Propofol infusion, Ondansetron and Dexamethasone  Airway Management Planned: Natural Airway  Additional Equipment:   Intra-op Plan:   Post-operative Plan:   Informed Consent: I have reviewed the patients History and Physical, chart, labs and discussed the procedure including the risks, benefits and alternatives for the proposed anesthesia with the patient or authorized representative who has indicated his/her understanding and acceptance.     Dental advisory given  Plan Discussed with: CRNA  Anesthesia Plan Comments: (Reviewed. PMH of former smoking, HTN, HLD, prediabetes, hypothyroidism,  GERD, anxiety, depression, arthritis. Seen by PCP on 2/24 and cleared.)        Anesthesia Quick Evaluation

## 2023-05-12 NOTE — H&P (Signed)
 KNEE ARTHROPLASTY ADMISSION H&P  Patient ID: SHANEQUA WHITENIGHT MRN: [id number] DOB/AGE: 1941/02/09 83 y.o.  Chief Complaint: left knee pain.  Planned Procedure Date: 05/13/23 Medical Clearance by Joen Laura, PA-C     HPI: DENEISHA DADE is a 83 y.o. female who presents for evaluation of left knee OA. The patient has a history of pain and functional disability in the left knee due to arthritis and has failed non-surgical conservative treatments for greater than 12 weeks to include NSAID's and/or analgesics, corticosteriod injections, viscosupplementation injections, and activity modification.  Onset of symptoms was gradual, starting 4 years ago with gradually worsening course since that time. The patient noted no past surgery on the left knee.  Patient currently rates pain at moderate with activity. Patient has worsening of pain with activity and weight bearing and pain that interferes with activities of daily living.  Patient has evidence of joint space narrowing by imaging studies.  There is no active infection.  Past Medical History:  Diagnosis Date   Anxiety    Arthritis    Depression    GERD (gastroesophageal reflux disease)    H/O hiatal hernia    Hypercholesterolemia    Hypertension    Hypothyroidism    Incontinence of urine    Osteoarthritis    Pneumonia    hx of   Past Surgical History:  Procedure Laterality Date   2 Synovial Cysts removed between L4-L5     ABDOMINAL HYSTERECTOMY     BACK SURGERY     back fusion after 2 back surgeries    CARPAL TUNNEL RELEASE     COLONOSCOPY     EYE SURGERY     tube inserted for excessive tearing , tube is removed    Right Knee Arthroscopy     SALPINGOOPHORECTOMY  1993   TONSILLECTOMY     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   Procedure: TOTAL HIP ARTHROPLASTY;  Surgeon: Nestor Lewandowsky, MD;  Location: [facility] OR;  Service: Orthopedics;  Laterality: Left;   TOTAL KNEE ARTHROPLASTY Right 03/04/2016   Procedure: TOTAL KNEE  ARTHROPLASTY;  Surgeon: Salvatore Marvel, MD;  Location: [facility] OR;  Service: Orthopedics;  Laterality: Right;   WISDOM TOOTH EXTRACTION     Allergies  Allergen Reactions   Avelox [Moxifloxacin Hcl In Nacl] Other (See Comments)    CHEST PAIN OF UNSPECIFIED ORIGIN   Cephalosporins Hives and Itching   Bee Venom    Other Itching and Swelling    UNSPECIFIED INSECT STINGS   Niacin And Related Rash   Prior to Admission medications   Medication Sig Start Date End Date Taking? Authorizing Provider  acetaminophen (TYLENOL) 325 MG tablet Take 2 tablets (650 mg total) by mouth every 6 (six) hours as needed for mild pain (or Fever >/= 101). 03/07/16   Shepperson, Kirstin, PA-C  amLODipine (NORVASC) 5 MG tablet Take 1 tablet (5 mg total) by mouth daily. 12/12/22   Melida Quitter, PA  aspirin EC 81 MG tablet Take 81 mg by mouth daily.    [provider]  Calcium Carb-Cholecalciferol (CALCIUM 600-D PO) Take 1 tablet by mouth daily.    [provider]  celecoxib (CELEBREX) 200 MG capsule Take 200 mg by mouth 2 (two) times daily. Daily    [provider]  Cholecalciferol (VITAMIN D-3) 5000 units TABS Take 5,000 Units by mouth daily.    [provider]  diphenhydrAMINE (BENADRYL) 25 mg capsule Take 25 mg by mouth every 6 (six) hours as needed.  [provider]  DULoxetine (CYMBALTA) 60 MG capsule Take 2 capsules (120 mg total) by mouth daily. 12/12/22   Melida Quitter, PA  estradiol (ESTRACE) 0.1 MG/GM vaginal cream Place 1 Applicatorful vaginally at bedtime. 3 nites per week for a month then once per week    [provider]  ketoconazole (NIZORAL) 2 % cream Apply topically daily. 04/28/23   Sandre Kitty, MD  levothyroxine (SYNTHROID) 50 MCG tablet Take 1 tablet (50 mcg total) by mouth daily before breakfast. Patient taking differently: Take 25 mcg by mouth daily before breakfast. 12/12/22   Melida Quitter, PA  losartan (COZAAR) 100 MG tablet Take 1  tablet (100 mg total) by mouth daily. 12/12/22   Melida Quitter, PA  omega-3 acid ethyl esters (LOVAZA) 1 g capsule Take 2 capsules (2 g total) by mouth 2 (two) times daily. 12/12/22   Melida Quitter, PA  simvastatin (ZOCOR) 40 MG tablet Take 1 tablet (40 mg total) by mouth at bedtime. Patient taking differently: Take 20 mg by mouth at bedtime. 12/12/22   Melida Quitter, PA  traZODone (DESYREL) 100 MG tablet TAKE 1 TABLET BY MOUTH AT BEDTIME FOR SLEEP 02/20/23   Saralyn Pilar A, PA  Vibegron (GEMTESA) 75 MG TABS Take by mouth daily. Pt takes at [employer], Historical, MD   Social History   Socioeconomic History   Marital status: Widowed    Spouse name: Not on file   Number of children: Not on file   Years of education: Not on file   Highest education level: Not on file  Occupational History   Not on file  Tobacco Use   Smoking status: Former    Current packs/day: 0.00    Average packs/day: 0.5 packs/day for 5.0 years (2.5 ttl pk-yrs)    Types: Cigarettes    Start date: 02/19/1960    Quit date: 02/18/1965    Years since quitting: 58.2    Passive exposure: Never   Smokeless tobacco: Never  Vaping Use   Vaping status: Never Used  Substance and Sexual Activity   Alcohol use: Yes    Comment: rare   Drug use: No   Sexual activity: Not Currently  Other Topics Concern   Not on file  Social History Narrative   Not on file   Social Drivers of Health   Financial Resource Strain: Medium Risk (05/31/2022)   Overall Financial Resource Strain (CARDIA)    Difficulty of Paying Living Expenses: Somewhat hard  Food Insecurity: No Food Insecurity (05/31/2022)   Hunger Vital Sign    Worried About Running Out of Food in the Last Year: Never true    Ran Out of Food in the Last Year: Never true  Transportation Needs: No Transportation Needs (05/31/2022)   PRAPARE - Administrator, Civil Service (Medical): No    Lack of Transportation (Non-Medical): No  Physical  Activity: Inactive (05/31/2022)   Exercise Vital Sign    Days of Exercise per Week: 0 days    Minutes of Exercise per Session: 0 min  Stress: No Stress Concern Present (05/31/2022)   [employer] of Occupational Health - Occupational Stress Questionnaire    Feeling of Stress : Only a little  Social Connections: Moderately Isolated (05/31/2022)   Social Connection and Isolation Panel [NHANES]    Frequency of Communication with Friends and Family: Three times a week    Frequency of Social Gatherings with Friends and Family: Once a week  Attends Religious Services: 1 to 4 times per year    Active Member of Clubs or Organizations: No    Attends Banker Meetings: Never    Marital Status: Widowed   Family History  Problem Relation Age of Onset   Hypertension Mother    Diabetes Mother    Stroke Mother    Alcoholism Father    Alcohol abuse Father    Depression Sister     ROS: Currently denies lightheadedness, dizziness, Fever, chills, CP, SOB.   No personal history of DVT, PE, MI, or CVA. No loose teeth or dentures All other systems have been reviewed and were otherwise currently negative with the exception of those mentioned in the HPI and as above.  Objective: Vitals: HT: 5\' 6"   WT: 229.8  T: 98.5  BP 57/81 P: 91  O2 SAT: 94% on room air.  Physical Exam: General: Alert, NAD.  Antalgic Gait  HEENT: EOMI, Good Neck Extension  Pulm: No increased work of breathing.  Clear B/L A/P w/o crackle or wheeze.  CV: RRR, No m/g/r appreciated  GI: soft, NT, ND Neuro: Neuro without gross focal deficit.  Sensation intact distally Skin: No lesions in the area of chief complaint MSK/Surgical Site: She has 20-100 degrees range of motion of the left knee with a positive medial and lateral joint line tenderness. Positive crepitus. Valgus alignment. +EHL/FHL.  NVI.  Stable varus and valgus stress.    Imaging Review Plain radiographs demonstrate severe degenerative joint disease of  the left knee.   The overall alignment issignificant valgus. The bone quality appears to be adequate for age and reported activity level.  Preoperative templating of the joint replacement has been completed, documented, and submitted to the Operating Room personnel in order to optimize intra-operative equipment management.  Assessment: left knee OA   Plan: Plan for Procedure(s): ARTHROPLASTY, KNEE, TOTAL  The patient history, physical exam, clinical judgement of the provider and imaging are consistent with end stage degenerative joint disease and total joint arthroplasty is deemed medically necessary. The treatment options including medical management, injection therapy, and arthroplasty were discussed at length. The risks and benefits of Procedure(s): ARTHROPLASTY, KNEE, TOTAL were presented and reviewed.  The risks of nonoperative treatment, versus surgical intervention including but not limited to continued pain, aseptic loosening, stiffness, dislocation/subluxation, infection, bleeding, nerve injury, blood clots, cardiopulmonary complications, morbidity, mortality, among others were discussed. The patient verbalizes understanding and wishes to proceed with the plan.  Patient is being admitted for inpatient treatment for surgery, pain control, PT, prophylactic antibiotics, VTE prophylaxis, progressive ambulation, ADL's and discharge planning.    The patient does meet the criteria for TXA which will be used perioperatively.   ASA 325 mg will be used postoperatively for DVT prophylaxis in addition to SCDs, and early ambulation. The patient is planning to be discharged home with OPPT in care of her son    Annita Brod 05/12/2023 2:47 PM

## 2023-05-12 NOTE — Progress Notes (Signed)
 Pt and her son aware to arrive at [facility] admitting at 0845 on Tuesday 05/13/2023 for scheduled surgical procedure. No food after midnight; clear liquids from midnight till 0815 also consuming entire pre surgery drink by 0815 then nothing by mouth.

## 2023-05-13 ENCOUNTER — Ambulatory Visit (HOSPITAL_COMMUNITY): Payer: Self-pay | Admitting: Certified Registered"

## 2023-05-13 ENCOUNTER — Encounter (HOSPITAL_COMMUNITY)
Admission: RE | Disposition: A | Discharge status: Skilled Nursing Facility | Payer: Self-pay | Source: Home / Self Care | Attending: Orthopedic Surgery

## 2023-05-13 ENCOUNTER — Encounter (HOSPITAL_COMMUNITY): Payer: Self-pay | Admitting: Orthopedic Surgery

## 2023-05-13 ENCOUNTER — Ambulatory Visit (HOSPITAL_COMMUNITY)

## 2023-05-13 ENCOUNTER — Ambulatory Visit (HOSPITAL_COMMUNITY): Payer: Self-pay | Admitting: Medical

## 2023-05-13 ENCOUNTER — Inpatient Hospital Stay (HOSPITAL_COMMUNITY)
Admission: RE | Admit: 2023-05-13 | Discharge: 2023-05-19 | DRG: 470 | Disposition: A | Discharge status: Skilled Nursing Facility | Payer: PPO | Attending: Orthopedic Surgery | Admitting: Orthopedic Surgery

## 2023-05-13 ENCOUNTER — Other Ambulatory Visit: Payer: Self-pay

## 2023-05-13 DIAGNOSIS — Z5986 Financial insecurity: Secondary | ICD-10-CM

## 2023-05-13 DIAGNOSIS — M1712 Unilateral primary osteoarthritis, left knee: Secondary | ICD-10-CM

## 2023-05-13 DIAGNOSIS — F32A Depression, unspecified: Secondary | ICD-10-CM | POA: Diagnosis present

## 2023-05-13 DIAGNOSIS — Z7982 Long term (current) use of aspirin: Secondary | ICD-10-CM

## 2023-05-13 DIAGNOSIS — Z79899 Other long term (current) drug therapy: Secondary | ICD-10-CM

## 2023-05-13 DIAGNOSIS — Z79818 Long term (current) use of other agents affecting estrogen receptors and estrogen levels: Secondary | ICD-10-CM

## 2023-05-13 DIAGNOSIS — Z8249 Family history of ischemic heart disease and other diseases of the circulatory system: Secondary | ICD-10-CM

## 2023-05-13 DIAGNOSIS — Z96652 Presence of left artificial knee joint: Principal | ICD-10-CM

## 2023-05-13 DIAGNOSIS — Z96651 Presence of right artificial knee joint: Secondary | ICD-10-CM | POA: Diagnosis present

## 2023-05-13 DIAGNOSIS — E7800 Pure hypercholesterolemia, unspecified: Secondary | ICD-10-CM | POA: Diagnosis present

## 2023-05-13 DIAGNOSIS — Z981 Arthrodesis status: Secondary | ICD-10-CM

## 2023-05-13 DIAGNOSIS — Z881 Allergy status to other antibiotic agents status: Secondary | ICD-10-CM

## 2023-05-13 DIAGNOSIS — Z823 Family history of stroke: Secondary | ICD-10-CM

## 2023-05-13 DIAGNOSIS — E039 Hypothyroidism, unspecified: Secondary | ICD-10-CM | POA: Diagnosis present

## 2023-05-13 DIAGNOSIS — Z01818 Encounter for other preprocedural examination: Secondary | ICD-10-CM

## 2023-05-13 DIAGNOSIS — R32 Unspecified urinary incontinence: Secondary | ICD-10-CM | POA: Diagnosis present

## 2023-05-13 DIAGNOSIS — Z833 Family history of diabetes mellitus: Secondary | ICD-10-CM

## 2023-05-13 DIAGNOSIS — Z87891 Personal history of nicotine dependence: Secondary | ICD-10-CM

## 2023-05-13 DIAGNOSIS — Z811 Family history of alcohol abuse and dependence: Secondary | ICD-10-CM

## 2023-05-13 DIAGNOSIS — Z79890 Hormone replacement therapy (postmenopausal): Secondary | ICD-10-CM

## 2023-05-13 DIAGNOSIS — Z96642 Presence of left artificial hip joint: Secondary | ICD-10-CM | POA: Diagnosis present

## 2023-05-13 DIAGNOSIS — Z818 Family history of other mental and behavioral disorders: Secondary | ICD-10-CM

## 2023-05-13 DIAGNOSIS — I10 Essential (primary) hypertension: Secondary | ICD-10-CM | POA: Diagnosis present

## 2023-05-13 DIAGNOSIS — Z91030 Bee allergy status: Secondary | ICD-10-CM

## 2023-05-13 HISTORY — PX: TOTAL KNEE ARTHROPLASTY: SHX125

## 2023-05-13 SURGERY — ARTHROPLASTY, KNEE, TOTAL
Anesthesia: General | Site: Knee | Laterality: Left

## 2023-05-13 MED ORDER — ORAL CARE MOUTH RINSE: 15.0000 mL | Freq: Once | OROMUCOSAL | Status: AC

## 2023-05-13 MED ORDER — ROPIVACAINE HCL 7.5 MG/ML IJ SOLN
INTRAMUSCULAR | Status: DC | PRN
  Administered 2023-05-13: 20 mL via PERINEURAL

## 2023-05-13 MED ORDER — LEVOTHYROXINE SODIUM 25 MCG PO TABS
25.0000 ug | ORAL_TABLET | Freq: Every day | ORAL | Status: DC
  Administered 2023-05-14 – 2023-05-19 (×6): 25 ug via ORAL
  Filled 2023-05-13 (×6): qty 1

## 2023-05-13 MED ORDER — PROPOFOL 500 MG/50ML IV EMUL
INTRAVENOUS | Status: DC | PRN
  Administered 2023-05-13: 75 ug/kg/min via INTRAVENOUS

## 2023-05-13 MED ORDER — DULOXETINE HCL 60 MG PO CPEP
120.0000 mg | ORAL_CAPSULE | Freq: Every day | ORAL | Status: DC
  Administered 2023-05-14 – 2023-05-19 (×6): 120 mg via ORAL
  Filled 2023-05-13 (×6): qty 2

## 2023-05-13 MED ORDER — DOCUSATE SODIUM 100 MG PO CAPS
100.0000 mg | ORAL_CAPSULE | Freq: Two times a day (BID) | ORAL | Status: DC
  Administered 2023-05-13 – 2023-05-19 (×12): 100 mg via ORAL
  Filled 2023-05-13 (×12): qty 1

## 2023-05-13 MED ORDER — OXYCODONE HCL 5 MG PO TABS
5.0000 mg | ORAL_TABLET | ORAL | Status: DC | PRN
  Administered 2023-05-13 – 2023-05-14 (×3): 5 mg via ORAL
  Filled 2023-05-13 (×2): qty 1

## 2023-05-13 MED ORDER — MIRABEGRON ER 25 MG PO TB24
25.0000 mg | ORAL_TABLET | Freq: Every day | ORAL | Status: DC
  Administered 2023-05-13 – 2023-05-18 (×6): 25 mg via ORAL
  Filled 2023-05-13 (×6): qty 1

## 2023-05-13 MED ORDER — OXYCODONE HCL 5 MG PO TABS
10.0000 mg | ORAL_TABLET | ORAL | Status: DC | PRN
  Administered 2023-05-13 – 2023-05-14 (×3): 10 mg via ORAL
  Administered 2023-05-14: 5 mg via ORAL
  Administered 2023-05-14 – 2023-05-15 (×2): 10 mg via ORAL
  Filled 2023-05-13 (×7): qty 2

## 2023-05-13 MED ORDER — METHOCARBAMOL 1000 MG/10ML IJ SOLN: 500.0000 mg | Freq: Four times a day (QID) | INTRAMUSCULAR | Status: DC | PRN

## 2023-05-13 MED ORDER — ALUM & MAG HYDROXIDE-SIMETH 200-200-20 MG/5ML PO SUSP: 30.0000 mL | ORAL | Status: DC | PRN

## 2023-05-13 MED ORDER — ACETAMINOPHEN 500 MG PO TABS
1000.0000 mg | ORAL_TABLET | Freq: Four times a day (QID) | ORAL | Status: AC
  Administered 2023-05-13 – 2023-05-14 (×4): 1000 mg via ORAL
  Filled 2023-05-13 (×4): qty 2

## 2023-05-13 MED ORDER — LIDOCAINE HCL (PF) 2 % IJ SOLN
INTRAMUSCULAR | Status: AC
Start: 2023-05-13 — End: ?
  Filled 2023-05-13: qty 5

## 2023-05-13 MED ORDER — HYDROMORPHONE HCL 1 MG/ML IJ SOLN: 0.2500 mg | INTRAMUSCULAR | Status: DC | PRN

## 2023-05-13 MED ORDER — PHENYLEPHRINE 80 MCG/ML (10ML) SYRINGE FOR IV PUSH (FOR BLOOD PRESSURE SUPPORT)
PREFILLED_SYRINGE | INTRAVENOUS | Status: DC | PRN
  Administered 2023-05-13: 80 ug via INTRAVENOUS

## 2023-05-13 MED ORDER — TRANEXAMIC ACID-NACL 1000-0.7 MG/100ML-% IV SOLN
1000.0000 mg | INTRAVENOUS | Status: AC
  Administered 2023-05-13: 1000 mg via INTRAVENOUS
  Filled 2023-05-13: qty 100

## 2023-05-13 MED ORDER — SODIUM CHLORIDE 0.9 % IR SOLN
Status: DC | PRN
  Administered 2023-05-13: 1000 mL

## 2023-05-13 MED ORDER — LIDOCAINE HCL (PF) 2 % IJ SOLN
INTRAMUSCULAR | Status: DC | PRN
Start: 2023-05-13 — End: 2023-05-13
  Administered 2023-05-13: 40 mg via INTRADERMAL

## 2023-05-13 MED ORDER — ONDANSETRON HCL 4 MG/2ML IJ SOLN
INTRAMUSCULAR | Status: DC | PRN
  Administered 2023-05-13: 4 mg via INTRAVENOUS

## 2023-05-13 MED ORDER — KETOROLAC TROMETHAMINE 30 MG/ML IJ SOLN
INTRAMUSCULAR | Status: AC
  Filled 2023-05-13: qty 1

## 2023-05-13 MED ORDER — FENTANYL CITRATE PF 50 MCG/ML IJ SOSY
50.0000 ug | PREFILLED_SYRINGE | INTRAMUSCULAR | Status: AC
Start: 2023-05-13 — End: 2023-05-13
  Administered 2023-05-13: 50 ug via INTRAVENOUS
  Filled 2023-05-13: qty 2

## 2023-05-13 MED ORDER — LACTATED RINGERS IV SOLN: INTRAVENOUS | Status: DC

## 2023-05-13 MED ORDER — PHENYLEPHRINE 80 MCG/ML (10ML) SYRINGE FOR IV PUSH (FOR BLOOD PRESSURE SUPPORT)
PREFILLED_SYRINGE | INTRAVENOUS | Status: AC
  Filled 2023-05-13: qty 10

## 2023-05-13 MED ORDER — SENNA-DOCUSATE SODIUM 8.6-50 MG PO TABS: 2.0000 | ORAL_TABLET | Freq: Every day | ORAL | 1 refills | Status: AC

## 2023-05-13 MED ORDER — CLONIDINE HCL (ANALGESIA) 100 MCG/ML EP SOLN
EPIDURAL | Status: DC | PRN
  Administered 2023-05-13: 80 ug

## 2023-05-13 MED ORDER — POLYETHYLENE GLYCOL 3350 17 G PO PACK
17.0000 g | PACK | Freq: Every day | ORAL | Status: DC | PRN
  Administered 2023-05-16 – 2023-05-19 (×3): 17 g via ORAL
  Filled 2023-05-13 (×3): qty 1

## 2023-05-13 MED ORDER — EPHEDRINE SULFATE-NACL 50-0.9 MG/10ML-% IV SOSY
PREFILLED_SYRINGE | INTRAVENOUS | Status: DC | PRN
  Administered 2023-05-13: 5 mg via INTRAVENOUS
  Administered 2023-05-13: 10 mg via INTRAVENOUS

## 2023-05-13 MED ORDER — PHENOL 1.4 % MT LIQD: 1.0000 | OROMUCOSAL | Status: DC | PRN

## 2023-05-13 MED ORDER — ONDANSETRON HCL 4 MG PO TABS: 4.0000 mg | ORAL_TABLET | Freq: Three times a day (TID) | ORAL | 0 refills | Status: AC | PRN

## 2023-05-13 MED ORDER — OXYCODONE HCL 5 MG PO TABS: 5.0000 mg | ORAL_TABLET | Freq: Once | ORAL | Status: DC | PRN

## 2023-05-13 MED ORDER — DEXAMETHASONE SODIUM PHOSPHATE 10 MG/ML IJ SOLN
10.0000 mg | Freq: Once | INTRAMUSCULAR | Status: AC
  Administered 2023-05-14: 10 mg via INTRAVENOUS
  Filled 2023-05-13: qty 1

## 2023-05-13 MED ORDER — GENTAMICIN SULFATE 40 MG/ML IJ SOLN
5.0000 mg/kg | INTRAVENOUS | Status: AC
  Administered 2023-05-13: 382 mg via INTRAVENOUS
  Filled 2023-05-13: qty 9.5

## 2023-05-13 MED ORDER — SODIUM CHLORIDE 0.9% FLUSH: 3.0000 mL | INTRAVENOUS | Status: DC | PRN

## 2023-05-13 MED ORDER — METOCLOPRAMIDE HCL 5 MG/ML IJ SOLN: 5.0000 mg | Freq: Three times a day (TID) | INTRAMUSCULAR | Status: DC | PRN

## 2023-05-13 MED ORDER — HYDROMORPHONE HCL 1 MG/ML IJ SOLN
0.5000 mg | INTRAMUSCULAR | Status: DC | PRN
  Administered 2023-05-13 – 2023-05-15 (×2): 1 mg via INTRAVENOUS
  Filled 2023-05-13 (×3): qty 1

## 2023-05-13 MED ORDER — ASPIRIN 325 MG PO TBEC
325.0000 mg | DELAYED_RELEASE_TABLET | Freq: Every day | ORAL | Status: DC
  Administered 2023-05-14: 325 mg via ORAL
  Filled 2023-05-13: qty 1

## 2023-05-13 MED ORDER — VANCOMYCIN HCL 1500 MG/300ML IV SOLN
1500.0000 mg | INTRAVENOUS | Status: AC
  Administered 2023-05-13: 1500 mg via INTRAVENOUS
  Filled 2023-05-13: qty 300

## 2023-05-13 MED ORDER — BUPIVACAINE IN DEXTROSE 0.75-8.25 % IT SOLN
INTRATHECAL | Status: DC | PRN
  Administered 2023-05-13: 1.8 mL via INTRATHECAL

## 2023-05-13 MED ORDER — ALBUMIN HUMAN 5 % IV SOLN: INTRAVENOUS | Status: DC | PRN

## 2023-05-13 MED ORDER — DROPERIDOL 2.5 MG/ML IJ SOLN: 0.6250 mg | Freq: Once | INTRAMUSCULAR | Status: DC | PRN

## 2023-05-13 MED ORDER — KETOROLAC TROMETHAMINE 30 MG/ML IJ SOLN
INTRAMUSCULAR | Status: DC | PRN
  Administered 2023-05-13: 31 mL

## 2023-05-13 MED ORDER — OXYCODONE HCL 5 MG/5ML PO SOLN: 5.0000 mg | Freq: Once | ORAL | Status: DC | PRN

## 2023-05-13 MED ORDER — CHLORHEXIDINE GLUCONATE 0.12 % MT SOLN
15.0000 mL | Freq: Once | OROMUCOSAL | Status: AC
  Administered 2023-05-13: 15 mL via OROMUCOSAL

## 2023-05-13 MED ORDER — ASPIRIN 325 MG PO TBEC
325.0000 mg | DELAYED_RELEASE_TABLET | Freq: Two times a day (BID) | ORAL | 0 refills | Status: AC
Start: 2023-05-13 — End: ?

## 2023-05-13 MED ORDER — ONDANSETRON HCL 4 MG PO TABS: 4.0000 mg | ORAL_TABLET | Freq: Four times a day (QID) | ORAL | Status: DC | PRN

## 2023-05-13 MED ORDER — EPHEDRINE 5 MG/ML INJ
INTRAVENOUS | Status: AC
  Filled 2023-05-13: qty 5

## 2023-05-13 MED ORDER — DEXAMETHASONE SODIUM PHOSPHATE 10 MG/ML IJ SOLN
INTRAMUSCULAR | Status: AC
  Filled 2023-05-13: qty 1

## 2023-05-13 MED ORDER — SIMVASTATIN 20 MG PO TABS
20.0000 mg | ORAL_TABLET | Freq: Every day | ORAL | Status: DC
  Administered 2023-05-14 – 2023-05-18 (×5): 20 mg via ORAL
  Filled 2023-05-13 (×5): qty 1

## 2023-05-13 MED ORDER — AMLODIPINE BESYLATE 5 MG PO TABS: 5.0000 mg | ORAL_TABLET | Freq: Every day | ORAL | Status: DC

## 2023-05-13 MED ORDER — SODIUM CHLORIDE 0.9% FLUSH
3.0000 mL | Freq: Two times a day (BID) | INTRAVENOUS | Status: DC
  Administered 2023-05-13 – 2023-05-14 (×2): 3 mL via INTRAVENOUS
  Administered 2023-05-14: 10 mL via INTRAVENOUS
  Administered 2023-05-15: 3 mL via INTRAVENOUS
  Administered 2023-05-16: 10 mL via INTRAVENOUS

## 2023-05-13 MED ORDER — ONDANSETRON HCL 4 MG/2ML IJ SOLN: 4.0000 mg | Freq: Four times a day (QID) | INTRAMUSCULAR | Status: DC | PRN

## 2023-05-13 MED ORDER — WATER FOR IRRIGATION, STERILE IR SOLN
Status: DC | PRN
  Administered 2023-05-13: 1000 mL

## 2023-05-13 MED ORDER — ONDANSETRON HCL 4 MG/2ML IJ SOLN
INTRAMUSCULAR | Status: AC
  Filled 2023-05-13: qty 2

## 2023-05-13 MED ORDER — LACTATED RINGERS IV BOLUS
500.0000 mL | Freq: Once | INTRAVENOUS | Status: AC
  Administered 2023-05-13: 500 mL via INTRAVENOUS

## 2023-05-13 MED ORDER — LOSARTAN POTASSIUM 50 MG PO TABS: 100.0000 mg | ORAL_TABLET | Freq: Every day | ORAL | Status: DC

## 2023-05-13 MED ORDER — TRANEXAMIC ACID-NACL 1000-0.7 MG/100ML-% IV SOLN
1000.0000 mg | Freq: Once | INTRAVENOUS | Status: AC
  Administered 2023-05-13: 1000 mg via INTRAVENOUS
  Filled 2023-05-13: qty 100

## 2023-05-13 MED ORDER — PHENYLEPHRINE HCL-NACL 20-0.9 MG/250ML-% IV SOLN
INTRAVENOUS | Status: DC | PRN
  Administered 2023-05-13: 30 ug/min via INTRAVENOUS

## 2023-05-13 MED ORDER — PROPOFOL 10 MG/ML IV BOLUS
INTRAVENOUS | Status: DC | PRN
Start: 2023-05-13 — End: 2023-05-13
  Administered 2023-05-13: 40 mg via INTRAVENOUS

## 2023-05-13 MED ORDER — MENTHOL 3 MG MT LOZG: 1.0000 | LOZENGE | OROMUCOSAL | Status: DC | PRN

## 2023-05-13 MED ORDER — POVIDONE-IODINE 10 % EX SWAB: 2.0000 | Freq: Once | CUTANEOUS | Status: DC

## 2023-05-13 MED ORDER — LIDOCAINE 2% (20 MG/ML) 5 ML SYRINGE: INTRAMUSCULAR | Status: DC | PRN

## 2023-05-13 MED ORDER — METOCLOPRAMIDE HCL 5 MG PO TABS: 5.0000 mg | ORAL_TABLET | Freq: Three times a day (TID) | ORAL | Status: DC | PRN

## 2023-05-13 MED ORDER — METHOCARBAMOL 500 MG PO TABS
500.0000 mg | ORAL_TABLET | Freq: Four times a day (QID) | ORAL | Status: DC | PRN
  Administered 2023-05-13 – 2023-05-19 (×12): 500 mg via ORAL
  Filled 2023-05-13 (×14): qty 1

## 2023-05-13 MED ORDER — ACETAMINOPHEN 500 MG PO TABS
1000.0000 mg | ORAL_TABLET | Freq: Once | ORAL | Status: AC
  Administered 2023-05-13: 1000 mg via ORAL
  Filled 2023-05-13: qty 2

## 2023-05-13 MED ORDER — POVIDONE-IODINE 7.5 % EX SOLN: Freq: Once | CUTANEOUS | Status: DC

## 2023-05-13 MED ORDER — DEXAMETHASONE SODIUM PHOSPHATE 10 MG/ML IJ SOLN
INTRAMUSCULAR | Status: DC | PRN
  Administered 2023-05-13: 4 mg via INTRAVENOUS

## 2023-05-13 MED ORDER — BISACODYL 10 MG RE SUPP
10.0000 mg | Freq: Every day | RECTAL | Status: DC | PRN
  Administered 2023-05-15: 10 mg via RECTAL
  Filled 2023-05-13: qty 1

## 2023-05-13 MED ORDER — DEXAMETHASONE SODIUM PHOSPHATE 4 MG/ML IJ SOLN
INTRAMUSCULAR | Status: DC | PRN
  Administered 2023-05-13: 4 mg via PERINEURAL

## 2023-05-13 MED ORDER — OXYCODONE HCL 5 MG PO TABS: 5.0000 mg | ORAL_TABLET | ORAL | 0 refills | Status: DC | PRN

## 2023-05-13 MED ORDER — LACTATED RINGERS IV BOLUS: 250.0000 mL | Freq: Once | INTRAVENOUS | Status: DC

## 2023-05-13 MED ORDER — PROPOFOL 1000 MG/100ML IV EMUL
INTRAVENOUS | Status: AC
Start: 2023-05-13 — End: ?
  Filled 2023-05-13: qty 100

## 2023-05-13 MED ORDER — DIPHENHYDRAMINE HCL 12.5 MG/5ML PO ELIX: 12.5000 mg | ORAL_SOLUTION | ORAL | Status: DC | PRN

## 2023-05-13 MED ORDER — ALBUMIN HUMAN 5 % IV SOLN
INTRAVENOUS | Status: AC
  Filled 2023-05-13: qty 250

## 2023-05-13 MED ORDER — ACETAMINOPHEN 325 MG PO TABS: 325.0000 mg | ORAL_TABLET | Freq: Four times a day (QID) | ORAL | Status: DC | PRN

## 2023-05-13 MED ORDER — MAGNESIUM CITRATE PO SOLN: 1.0000 | Freq: Once | ORAL | Status: DC | PRN

## 2023-05-13 MED ORDER — BUPIVACAINE HCL (PF) 0.25 % IJ SOLN
INTRAMUSCULAR | Status: AC
  Filled 2023-05-13: qty 30

## 2023-05-13 MED ORDER — FENTANYL CITRATE (PF) 100 MCG/2ML IJ SOLN
INTRAMUSCULAR | Status: AC
  Filled 2023-05-13: qty 2

## 2023-05-13 SURGICAL SUPPLY — 49 items
ATTUNE MED DOME PAT 32 KNEE (Knees) IMPLANT
ATTUNE PSFEM LTSZ5 NARCEM KNEE (Femur) IMPLANT
ATTUNE PSRP INSR SZ5 5 KNEE (Insert) IMPLANT
BAG COUNTER SPONGE SURGICOUNT (BAG) IMPLANT
BAG ZIPLOCK 12X15 (MISCELLANEOUS) IMPLANT
BASEPLATE TIB CMT FB PCKT SZ5 (Knees) IMPLANT
BLADE SAG 18X100X1.27 (BLADE) ×2 IMPLANT
BLADE SAW SGTL 11.0X1.19X90.0M (BLADE) IMPLANT
BLADE SAW SGTL 13X75X1.27 (BLADE) ×2 IMPLANT
BLADE SURG 15 STRL LF DISP TIS (BLADE) ×2 IMPLANT
BNDG ELASTIC 6X10 VLCR STRL LF (GAUZE/BANDAGES/DRESSINGS) ×2 IMPLANT
BOWL SMART MIX CTS (DISPOSABLE) ×2 IMPLANT
CEMENT HV SMART SET (Cement) ×4 IMPLANT
CLSR STERI-STRIP ANTIMIC 1/2X4 (GAUZE/BANDAGES/DRESSINGS) ×4 IMPLANT
COVER SURGICAL LIGHT HANDLE (MISCELLANEOUS) ×2 IMPLANT
CUFF TRNQT CYL 34X4.125X (TOURNIQUET CUFF) ×2 IMPLANT
DRAPE SHEET LG 3/4 BI-LAMINATE (DRAPES) ×2 IMPLANT
DRAPE U-SHAPE 47X51 STRL (DRAPES) ×2 IMPLANT
DRSG MEPILEX POST OP 4X12 (GAUZE/BANDAGES/DRESSINGS) ×2 IMPLANT
DURAPREP 26ML APPLICATOR (WOUND CARE) ×4 IMPLANT
ELECT REM PT RETURN 15FT ADLT (MISCELLANEOUS) ×2 IMPLANT
GAUZE PAD ABD 8X10 STRL (GAUZE/BANDAGES/DRESSINGS) ×4 IMPLANT
GLOVE BIO SURGEON STRL SZ 6.5 (GLOVE) ×2 IMPLANT
GLOVE BIO SURGEON STRL SZ7.5 (GLOVE) ×2 IMPLANT
GLOVE BIOGEL PI IND STRL 7.0 (GLOVE) ×2 IMPLANT
GLOVE BIOGEL PI IND STRL 8 (GLOVE) ×2 IMPLANT
GOWN STRL SURGICAL XL XLNG (GOWN DISPOSABLE) ×4 IMPLANT
HOLDER FOLEY CATH W/STRAP (MISCELLANEOUS) IMPLANT
IMMOBILIZER KNEE 20 (SOFTGOODS) ×1 IMPLANT
IMMOBILIZER KNEE 20 THIGH 36 (SOFTGOODS) ×2 IMPLANT
INSERT TIB FIX BEARNG SZ 5 5MM (Insert) IMPLANT
KIT TURNOVER KIT A (KITS) IMPLANT
MANIFOLD NEPTUNE II (INSTRUMENTS) ×2 IMPLANT
NS IRRIG 1000ML POUR BTL (IV SOLUTION) ×2 IMPLANT
PACK TOTAL KNEE CUSTOM (KITS) ×2 IMPLANT
PIN STEINMAN FIXATION KNEE (PIN) IMPLANT
PROTECTOR NERVE ULNAR (MISCELLANEOUS) ×2 IMPLANT
SET HNDPC FAN SPRY TIP SCT (DISPOSABLE) ×2 IMPLANT
SET PAD KNEE POSITIONER (MISCELLANEOUS) ×2 IMPLANT
SPIKE FLUID TRANSFER (MISCELLANEOUS) IMPLANT
SUT MNCRL AB 3-0 PS2 18 (SUTURE) IMPLANT
SUT STRATAFIX PDS+ 0 24IN (SUTURE) ×2 IMPLANT
SUT VIC AB 1 CT1 36 (SUTURE) ×4 IMPLANT
SUT VIC AB 2-0 CT1 TAPERPNT 27 (SUTURE) ×2 IMPLANT
SUT VIC AB 3-0 SH 27X BRD (SUTURE) IMPLANT
TRAY FOLEY MTR SLVR 16FR STAT (SET/KITS/TRAYS/PACK) ×2 IMPLANT
TUBE SUCTION HIGH CAP CLEAR NV (SUCTIONS) ×2 IMPLANT
WATER STERILE IRR 1000ML POUR (IV SOLUTION) ×4 IMPLANT
WRAP KNEE MAXI GEL POST OP (GAUZE/BANDAGES/DRESSINGS) ×2 IMPLANT

## 2023-05-13 NOTE — Transfer of Care (Signed)
 Immediate Anesthesia Transfer of Care Note  Patient: Shelly Hickman  Procedure(s) Performed: ARTHROPLASTY, KNEE, TOTAL (Left: Knee)  Patient Location: PACU  Anesthesia Type:Spinal  Level of Consciousness: awake, alert , and patient cooperative  Airway & Oxygen Therapy: Patient Spontanous Breathing and Patient connected to face mask oxygen  Post-op Assessment: Report given to RN and Post -op Vital signs reviewed and stable  Post vital signs: Reviewed and stable  Last Vitals:  Vitals Value Taken Time  BP 119/61   Temp    Pulse 84 05/13/23 1405  Resp 16 05/13/23 1405  SpO2 97 % 05/13/23 1405  Vitals shown include unfiled device data.  Last Pain:  Vitals:   05/13/23 1120  TempSrc:   PainSc: 0-No pain         Complications: No notable events documented.

## 2023-05-13 NOTE — Anesthesia Procedure Notes (Addendum)
 Spinal  Patient location during procedure: OR Start time: 05/13/2023 11:42 AM Reason for block: surgical anesthesia Staffing Performed: resident/CRNA  Anesthesiologist: Lewie Loron, MD Resident/CRNA: Sindy Guadeloupe, CRNA Performed by: Sindy Guadeloupe, CRNA Authorized by: Lewie Loron, MD   Preanesthetic Checklist Completed: patient identified, IV checked, site marked, risks and benefits discussed, surgical consent, monitors and equipment checked, pre-op evaluation and timeout performed Spinal Block Patient position: sitting Prep: DuraPrep Patient monitoring: continuous pulse ox, blood pressure, heart rate and cardiac monitor Approach: midline Location: L3-4 Injection technique: single-shot Needle Needle type: Pencan  Needle gauge: 24 G Needle length: 10 cm Assessment Sensory level: T4 Events: CSF return Additional Notes Pt placed in sitting position, spinal kit expiration date checked and verified, timeout performed by Dr Renold Don, + CSF, - heme, pt tolerated well. Dr Renold Don present and supervising throughout SAB placement. Adequate sensory level. Lot [id number].

## 2023-05-13 NOTE — Op Note (Signed)
 DATE OF SURGERY:  05/13/2023 TIME: 1:32 PM  PATIENT NAME:  Shelly Hickman   AGE: 83 y.o.    PRE-OPERATIVE DIAGNOSIS: Left knee primary localized osteoarthritis  POST-OPERATIVE DIAGNOSIS:  Same  PROCEDURE: Left total Knee Arthroplasty  SURGEON:  Eulas Post, MD   ASSISTANT:  Janine Ores, PA-C, present and scrubbed throughout the case, critical for assistance with exposure, retraction, instrumentation, and closure.  OPERATIVE IMPLANTS: Implant Name: CEMENT HV SMART SET - [id number] Type: Cement Inv. Item: CEMENT HV SMART SET Serial No.:  Manufacturer: [employer] Lot No.: [id number] LRB: Left No. Used: 2 Action: Implanted   Implant Name: ATTUNE PSFEM LTSZ5 NARCEM KNEE - [id number] Type: Femur Inv. Item: ATTUNE PSFEM LTSZ5 NARCEM KNEE Serial No.:  Manufacturer: [employer] Lot No.: [id number] LRB: Left No. Used: 1 Action: Implanted   Implant Name: ATTUNE MED DOME PAT 32 KNEE - [id number] Type: Knees Inv. Item: ATTUNE MED DOME PAT 32 KNEE Serial No.:  Manufacturer: [employer] Lot No.: [id number] LRB: Left No. Used: 1 Action: Implanted   Implant Name: BASEPLATE TIB CMT FB PCKT SZ5 - [id number] Type: Knees Inv. Item: BASEPLATE TIB CMT FB PCKT SZ5 Serial No.:  Manufacturer: [employer] Lot No.: [id number] LRB: Left No. Used: 1 Action: Implanted   Implant Name: INSERT TIB FIX BEARNG SZ 5 - [id number] Type: Insert Inv. Item: INSERT TIB FIX BEARNG SZ 5 Serial No.:  Manufacturer: [employer] Lot No.: [id number] LRB: Left No. Used: 1 Action: Implanted   PREOPERATIVE INDICATIONS:  Shelly Hickman is a 83 y.o. year old female with end stage bone on bone degenerative arthritis of the knee who failed conservative treatment, including injections, antiinflammatories, activity modification, and assistive devices, and had significant impairment of their activities of daily living, and elected for Total Knee Arthroplasty.    The risks, benefits, and alternatives were discussed at length including but not limited to the risks of infection, bleeding, nerve injury, stiffness, blood clots, the need for revision surgery, cardiopulmonary complications, among others, and they were willing to proceed.  OPERATIVE FINDINGS AND UNIQUE ASPECTS OF THE CASE: She had significant valgus alignment, with full-thickness chondral loss laterally.  Overall tissue quality was quite delicate.  Bone quality was mediocre at best.  The patella only measured 19 mm thick.  During selection of the polyethylene, a rotating platform polyethylene size 5 was inadvertently opened, and I waited for the cement to cure to place the final fixed-bearing polyethylene.  I had excellent no thumb tracking, and full extension.  ESTIMATED BLOOD LOSS: 150 mL  OPERATIVE DESCRIPTION:  The patient was brought to the operative room and placed in a supine position.  Anesthesia was administered.  IV antibiotics were given.  The lower extremity was prepped and draped in the usual sterile fashion.  Time out was performed.  The leg was elevated and exsanguinated and the tourniquet was inflated.  Anterior quadriceps tendon splitting approach was performed.  The patella was everted and osteophytes were removed.  The anterior horn of the medial and lateral meniscus was removed.   The patella was then measured, and cut with the saw.  The thickness before the cut was 19 and after the cut was 13.5.  A metal shield was used to protect the patella throughout the case.    The distal femur was opened with the drill and the intramedullary distal femoral cutting jig was utilized, set at 5 degrees resecting 9 mm off the distal femur.  Care was taken to  protect the collateral ligaments.  Then the extramedullary tibial cutting jig was utilized making the appropriate cut using the anterior tibial crest as a reference building in appropriate posterior slope.  Care was taken during the cut  to protect the medial and collateral ligaments.  The proximal tibia was removed along with the posterior horns of the menisci.  The PCL was sacrificed.    The extensor gap was measured and found to have adequate resection, measuring to a size 5.    The distal femoral sizing jig was applied, taking care to avoid notching.  This was set at 3 degrees of external rotation.  Then the 4-in-1 cutting jig was applied and the anterior and posterior femur was cut, along with the chamfer cuts.  All posterior osteophytes were removed.  The flexion gap was then measured and was symmetric with the extension gap.  I completed the distal femoral preparation using the appropriate jig to prepare the box.  The proximal tibia sized and prepared accordingly with the reamer and the punch, and then all components were trialed with the poly insert.  The knee was found to have excellent balance and full motion.    The above named components were then cemented into place and all excess cement was removed.  The real polyethylene implant was placed.  After the cement had cured I released the tourniquet and confirmed excellent hemostasis with no major posterior vessel injury.    The knee was easily taken through a range of motion and the patella tracked well and the knee irrigated copiously and the parapatellar tissue closed with Stratafix and vicryl, and subcutaneous tissue closed with vicryl, and monocryl with steri strips for the skin.  The wounds were injected with marcaine, and dressed with sterile gauze and the patient was awakened and returned to the PACU in stable and satisfactory condition.  There were no complications.  Total tourniquet time was 71 minutes.

## 2023-05-13 NOTE — Interval H&P Note (Signed)
 History and Physical Interval Note:  05/13/2023 11:29 AM  Shelly Hickman  has presented today for surgery, with the diagnosis of left knee OA.  The various methods of treatment have been discussed with the patient and family. After consideration of risks, benefits and other options for treatment, the patient has consented to  Procedure(s): ARTHROPLASTY, KNEE, TOTAL (Left) as a surgical intervention.  The patient's history has been reviewed, patient examined, no change in status, stable for surgery.  I have reviewed the patient's chart and labs.  Questions were answered to the patient's satisfaction.     Eulas Post

## 2023-05-13 NOTE — Anesthesia Procedure Notes (Signed)
 Anesthesia Regional Block: Adductor canal block   Pre-Anesthetic Checklist: , timeout performed,  Correct Patient, Correct Site, Correct Laterality,  Correct Procedure, Correct Position, site marked,  Risks and benefits discussed,  Surgical consent,  Pre-op evaluation,  At surgeon's request and post-op pain management  Laterality: Lower and Left  Prep: chloraprep       Needles:  Injection technique: Single-shot  Needle Type: Stimiplex     Needle Length: 9cm  Needle Gauge: 21     Additional Needles:   Procedures:,,,, ultrasound used (permanent image in chart),,    Narrative:  Start time: 05/13/2023 10:53 AM End time: 05/13/2023 11:13 AM Injection made incrementally with aspirations every 5 mL.  Performed by: Personally  Anesthesiologist: Lewie Loron, MD  Additional Notes: BP cuff, EKG monitors applied. Sedation begun. Artery and nerve location verified with ultrasound. Anesthetic injected incrementally (5ml), slowly, and after negative aspirations under direct u/s guidance. Good fascial/perineural spread. Tolerated well.

## 2023-05-13 NOTE — Anesthesia Postprocedure Evaluation (Signed)
 Anesthesia Post Note  Patient: Shelly Hickman  Procedure(s) Performed: ARTHROPLASTY, KNEE, TOTAL (Left: Knee)     Patient location during evaluation: PACU Anesthesia Type: General Level of consciousness: sedated and patient cooperative Pain management: pain level controlled Vital Signs Assessment: post-procedure vital signs reviewed and stable Respiratory status: spontaneous breathing Cardiovascular status: stable Anesthetic complications: no   No notable events documented.  Last Vitals:  Vitals:   05/13/23 1405 05/13/23 1415  BP: 119/61 107/70  Pulse: 87 81  Resp: 18 18  Temp: 36.6 C   SpO2: 97% 98%    Last Pain:  Vitals:   05/13/23 1415  TempSrc:   PainSc: 0-No pain    LLE Motor Response: Purposeful movement;Responds to commands (05/13/23 1415) LLE Sensation: Decreased;Numbness;No pain (05/13/23 1415) RLE Motor Response: Purposeful movement;Responds to commands (05/13/23 1415) RLE Sensation: Decreased;Numbness;No pain (05/13/23 1415) L Sensory Level: L4-Anterior knee, lower leg (05/13/23 1415) R Sensory Level: L5-Outer lower leg, top of foot, great toe (05/13/23 1415)  Lewie Loron

## 2023-05-13 NOTE — Plan of Care (Signed)
   Problem: Education: Goal: Knowledge of General Education information will improve Description Including pain rating scale, medication(s)/side effects and non-pharmacologic comfort measures Outcome: Progressing   Problem: Health Behavior/Discharge Planning: Goal: Ability to manage health-related needs will improve Outcome: Progressing

## 2023-05-13 NOTE — Discharge Instructions (Signed)
 INSTRUCTIONS AFTER JOINT REPLACEMENT   Remove items at home which could result in a fall. This includes throw rugs or furniture in walking pathways ICE to the affected joint every three hours while awake for 30 minutes at a time, for at least the first 3-5 days, and then as needed for pain and swelling.  Continue to use ice for pain and swelling. You may notice swelling that will progress down to the foot and ankle.  This is normal after surgery.  Elevate your leg when you are not up walking on it.   Continue to use the breathing machine you got in the hospital (incentive spirometer) which will help keep your temperature down.  It is common for your temperature to cycle up and down following surgery, especially at night when you are not up moving around and exerting yourself.  The breathing machine keeps your lungs expanded and your temperature down.   DIET:  As you were doing prior to hospitalization, we recommend a well-balanced diet.  DRESSING / WOUND CARE / SHOWERING  You may shower 3 days after surgery, but keep the wounds dry during showering.  You may use an occlusive plastic wrap (Press'n Seal for example), NO SOAKING/SUBMERGING IN THE BATHTUB.  If the bandage gets wet, change with a clean dry gauze.  If the incision gets wet, pat the wound dry with a clean towel. Ok to remove the ace wrap around your leg 24 hours after surgery. Please then use the compression stockings on both legs during the day (you may remove at night) until follow-up visit.   ACTIVITY  Increase activity slowly as tolerated, but follow the weight bearing instructions below.   No driving for 6 weeks or until further direction given by your physician.  You cannot drive while taking narcotics.  No lifting or carrying greater than 10 lbs. until further directed by your surgeon. Avoid periods of inactivity such as sitting longer than an hour when not asleep. This helps prevent blood clots.  You may return to work once you  are authorized by your doctor.     WEIGHT BEARING   Weight bearing as tolerated with assist device (walker, cane, etc) as directed, use it as long as suggested by your surgeon or therapist, typically at least 4-6 weeks.   EXERCISES  Results after joint replacement surgery are often greatly improved when you follow the exercise, range of motion and muscle strengthening exercises prescribed by your doctor. Safety measures are also important to protect the joint from further injury. Any time any of these exercises cause you to have increased pain or swelling, decrease what you are doing until you are comfortable again and then slowly increase them. If you have problems or questions, call your caregiver or physical therapist for advice.   Rehabilitation is important following a joint replacement. After just a few days of immobilization, the muscles of the leg can become weakened and shrink (atrophy).  These exercises are designed to build up the tone and strength of the thigh and leg muscles and to improve motion. Often times heat used for twenty to thirty minutes before working out will loosen up your tissues and help with improving the range of motion but do not use heat for the first two weeks following surgery (sometimes heat can increase post-operative swelling).   These exercises can be done on a training (exercise) mat, on the floor, on a table or on a bed. Use whatever works the best and is most comfortable  for you.    Use music or television while you are exercising so that the exercises are a pleasant break in your day. This will make your life better with the exercises acting as a break in your routine that you can look forward to.   Perform all exercises about fifteen times, three times per day or as directed.  You should exercise both the operative leg and the other leg as well.  Exercises include:   Quad Sets - Tighten up the muscle on the front of the thigh (Quad) and hold for 5-10  seconds.   Straight Leg Raises - With your knee straight (if you were given a brace, keep it on), lift the leg to 60 degrees, hold for 3 seconds, and slowly lower the leg.  Perform this exercise against resistance later as your leg gets stronger.  Leg Slides: Lying on your back, slowly slide your foot toward your buttocks, bending your knee up off the floor (only go as far as is comfortable). Then slowly slide your foot back down until your leg is flat on the floor again.  Angel Wings: Lying on your back spread your legs to the side as far apart as you can without causing discomfort.  Hamstring Strength:  Lying on your back, push your heel against the floor with your leg straight by tightening up the muscles of your buttocks.  Repeat, but this time bend your knee to a comfortable angle, and push your heel against the floor.  You may put a pillow under the heel to make it more comfortable if necessary.   A rehabilitation program following joint replacement surgery can speed recovery and prevent re-injury in the future due to weakened muscles. Contact your doctor or a physical therapist for more information on knee rehabilitation.    CONSTIPATION  Constipation is defined medically as fewer than three stools per week and severe constipation as less than one stool per week.  Even if you have a regular bowel pattern at home, your normal regimen is likely to be disrupted due to multiple reasons following surgery.  Combination of anesthesia, postoperative narcotics, change in appetite and fluid intake all can affect your bowels.   YOU MUST use at least one of the following options; they are listed in order of increasing strength to get the job done.  They are all available over the counter, and you may need to use some, POSSIBLY even all of these options:    Drink plenty of fluids (prune juice may be helpful) and high fiber foods Colace 100 mg by mouth twice a day  Senokot for constipation as directed and  as needed Dulcolax (bisacodyl), take with full glass of water  Miralax (polyethylene glycol) once or twice a day as needed.  If you have tried all these things and are unable to have a bowel movement in the first 3-4 days after surgery call either your surgeon or your primary doctor.    If you experience loose stools or diarrhea, hold the medications until you stool forms back up.  If your symptoms do not get better within 1 week or if they get worse, check with your doctor.  If you experience "the worst abdominal pain ever" or develop nausea or vomiting, please contact the office immediately for further recommendations for treatment.   ITCHING:  If you experience itching with your medications, try taking only a single pain pill, or even half a pain pill at a time.  You can also  use Benadryl over the counter for itching or also to help with sleep.   TED HOSE STOCKINGS:  Use stockings on both legs until for at least 2 weeks or as directed by physician office. They may be removed at night for sleeping.  MEDICATIONS:  See your medication summary on the "After Visit Summary" that nursing will review with you.  You may have some home medications which will be placed on hold until you complete the course of blood thinner medication.  It is important for you to complete the blood thinner medication as prescribed.  PRECAUTIONS:  If you experience chest pain or shortness of breath - call 911 immediately for transfer to the hospital emergency department.   If you develop a fever greater that 101 F, purulent drainage from wound, increased redness or drainage from wound, foul odor from the wound/dressing, or calf pain - CONTACT YOUR SURGEON.                                                   FOLLOW-UP APPOINTMENTS:  If you do not already have a post-op appointment, please call the office for an appointment to be seen by your surgeon.  Guidelines for how soon to be seen are listed in your "After Visit Summary",  but are typically between 1-4 weeks after surgery.  OTHER INSTRUCTIONS:   Knee Replacement:  Do not place pillow under knee, focus on keeping the knee straight while resting.    POST-OPERATIVE OPIOID TAPER INSTRUCTIONS: It is important to wean off of your opioid medication as soon as possible. If you do not need pain medication after your surgery it is ok to stop day one. Opioids include: Codeine, Hydrocodone(Norco, Vicodin), Oxycodone(Percocet, oxycontin) and hydromorphone amongst others.  Long term and even short term use of opiods can cause: Increased pain response Dependence Constipation Depression Respiratory depression And more.  Withdrawal symptoms can include Flu like symptoms Nausea, vomiting And more Techniques to manage these symptoms Hydrate well Eat regular healthy meals Stay active Use relaxation techniques(deep breathing, meditating, yoga) Do Not substitute Alcohol to help with tapering If you have been on opioids for less than two weeks and do not have pain than it is ok to stop all together.  Plan to wean off of opioids This plan should start within one week post op of your joint replacement. Maintain the same interval or time between taking each dose and first decrease the dose.  Cut the total daily intake of opioids by one tablet each day Next start to increase the time between doses. The last dose that should be eliminated is the evening dose.   MAKE SURE YOU:  Understand these instructions.  Get help right away if you are not doing well or get worse.    Thank you for letting us be a part of your medical care team.  It is a privilege we respect greatly.  We hope these instructions will help you stay on track for a fast and full recovery!

## 2023-05-13 NOTE — Anesthesia Procedure Notes (Addendum)
 Procedure Name: MAC Date/Time: 05/13/2023 11:39 AM  Performed by: Sindy Guadeloupe, CRNAPre-anesthesia Checklist: Patient identified, Emergency Drugs available, Suction available, Patient being monitored and Timeout performed Oxygen Delivery Method: Simple face mask Placement Confirmation: positive ETCO2

## 2023-05-14 ENCOUNTER — Encounter (HOSPITAL_COMMUNITY): Payer: Self-pay | Admitting: Orthopedic Surgery

## 2023-05-14 LAB — BASIC METABOLIC PANEL
Anion gap: 7 (ref 5–15)
BUN: 20 mg/dL (ref 8–23)
CO2: 25 mmol/L (ref 22–32)
Calcium: 8.4 mg/dL — ABNORMAL LOW (ref 8.9–10.3)
Chloride: 105 mmol/L (ref 98–111)
Creatinine, Ser: 1.1 mg/dL — ABNORMAL HIGH (ref 0.44–1.00)
GFR, Estimated: 51 mL/min — ABNORMAL LOW (ref >60)
Glucose, Bld: 156 mg/dL — ABNORMAL HIGH (ref 70–99)
Potassium: 4.6 mmol/L (ref 3.5–5.1)
Sodium: 137 mmol/L (ref 135–145)

## 2023-05-14 LAB — CBC
HCT: 41.3 % (ref 36.0–46.0)
Hemoglobin: 12.6 g/dL (ref 12.0–15.0)
MCH: 29.1 pg (ref 26.0–34.0)
MCHC: 30.5 g/dL (ref 30.0–36.0)
MCV: 95.4 fL (ref 80.0–100.0)
Platelets: 297 10*3/uL (ref 150–400)
RBC: 4.33 MIL/uL (ref 3.87–5.11)
RDW: 14 % (ref 11.5–15.5)
WBC: 17.1 10*3/uL — ABNORMAL HIGH (ref 4.0–10.5)
nRBC: 0 % (ref 0.0–0.2)

## 2023-05-14 MED ORDER — AMLODIPINE BESYLATE 5 MG PO TABS
5.0000 mg | ORAL_TABLET | Freq: Every day | ORAL | Status: DC
  Administered 2023-05-14: 5 mg via ORAL
  Filled 2023-05-14: qty 1

## 2023-05-14 MED ORDER — LOSARTAN POTASSIUM 50 MG PO TABS
100.0000 mg | ORAL_TABLET | Freq: Every day | ORAL | Status: DC
  Administered 2023-05-14: 100 mg via ORAL
  Filled 2023-05-14: qty 2

## 2023-05-14 NOTE — Care Management Obs Status (Signed)
 MEDICARE OBSERVATION STATUS NOTIFICATION   Patient Details  Name: WILHELMINA HARK MRN: [id number] Date of Birth: [birth year]   Medicare Observation Status Notification Given:  Yes    Howell Rucks, RN 05/14/2023, 9:29 AM

## 2023-05-14 NOTE — Progress Notes (Signed)
 Physical Therapy Treatment Patient Details Name: Shelly Hickman MRN: [id number] DOB: March 22, 1940 Today's Date: 05/14/2023   History of Present Illness 83 yo female S/P L TKA. PMH: RTKA, L THA,, urinary incontinence, Neuromuscular disorder, HTN, anemia, back surgeries.    PT Comments  The patient is improving but not yet deemed safe for DC, Continues with significant pain  with WB per patient. Patient is also unsteady while managing  briefs and  absorbant pad when needing to change.  Continue PT.     If plan is discharge home, recommend the following: A little help with walking and/or transfers;A little help with bathing/dressing/bathroom;Assist for transportation;Help with stairs or ramp for entrance   Can travel by private vehicle        Equipment Recommendations  None recommended by PT    Recommendations for Other Services       Precautions / Restrictions Precautions Precautions: Fall;Knee Restrictions Weight Bearing Restrictions Per Provider Order: Yes LLE Weight Bearing Per Provider Order: Weight bearing as tolerated     Mobility  Bed Mobility Overal bed mobility: Needs Assistance Bed Mobility: Sit to Supine     Supine to sit: Contact guard Sit to supine: Supervision   General bed mobility comments: used belt to lift leg onto bed    Transfers Overall transfer level: Needs assistance Equipment used: Rolling walker (2 wheels) Transfers: Sit to/from Stand Sit to Stand: From elevated surface, Min assist           General transfer comment: able to power up from recliner  and toilet    Ambulation/Gait Ambulation/Gait assistance: Min assist Gait Distance (Feet): 20 Feet (then 50) Assistive device: Rolling walker (2 wheels) Gait Pattern/deviations: Step-to pattern, Antalgic Gait velocity: decr     General Gait Details: Patient very antalgic on LLE and reports increased pain with WB   Stairs             Wheelchair Mobility     Tilt Bed     Modified Rankin (Stroke Patients Only)       Balance Overall balance assessment: Needs assistance         Standing balance support: Bilateral upper extremity supported, During functional activity, Reliant on assistive device for balance Standing balance-Leahy Scale: Fair                              Hotel manager: No apparent difficulties Factors Affecting Communication: Hearing impaired  Cognition Arousal: Alert Behavior During Therapy: WFL for tasks assessed/performed   PT - Cognitive impairments: No apparent impairments                                Cueing    Exercises Total Joint Exercises Ankle Circles/Pumps: AROM, Both, 10 reps Quad Sets: AROM, Left, 5 reps Hip ABduction/ADduction: AROM, Left, 5 reps Straight Leg Raises: AAROM, Left, 5 reps    General Comments        Pertinent Vitals/Pain Pain Assessment Pain Assessment: 0-10 Pain Score: 7  Pain Location: left knee when WB, 0 at rest Pain Descriptors / Indicators: Grimacing, Discomfort Pain Intervention(s): Premedicated before session    Home Living Family/patient expects to be discharged to:: Private residence Living Arrangements: Alone Available Help at Discharge: Family;Available 24 hours/day Type of Home: House Home Access: Ramped entrance       Home Layout: One level Home Equipment: Agricultural consultant (2 wheels);Cane -  single point;BSC/3in1;Toilet riser      Prior Function            PT Goals (current goals can now be found in the care plan section) Acute Rehab PT Goals Patient Stated Goal: go home PT Goal Formulation: With patient Time For Goal Achievement: 05/21/23 Potential to Achieve Goals: Good Progress towards PT goals: Progressing toward goals    Frequency    7X/week      PT Plan      Co-evaluation              AM-PAC PT "6 Clicks" Mobility   Outcome Measure  Help needed turning from your back to your side  while in a flat bed without using bedrails?: A Little Help needed moving from lying on your back to sitting on the side of a flat bed without using bedrails?: A Little Help needed moving to and from a bed to a chair (including a wheelchair)?: A Little Help needed standing up from a chair using your arms (e.g., wheelchair or bedside chair)?: A Little Help needed to walk in hospital room?: A Little Help needed climbing 3-5 steps with a railing? : A Lot 6 Click Score: 17    End of Session Equipment Utilized During Treatment: Gait belt Activity Tolerance: Patient limited by pain Patient left: in bed;with bed alarm set;with call bell/phone within reach Nurse Communication: Mobility status PT Visit Diagnosis: Unsteadiness on feet (R26.81);Other abnormalities of gait and mobility (R26.89);Pain Pain - Right/Left: Left Pain - part of body: Knee     Time: 1610-9604 PT Time Calculation (min) (ACUTE ONLY): 38 min  Charges:    $Gait Training: 8-22 mins $Self Care/Home Management: 23-37 PT General Charges $$ ACUTE PT VISIT: 1 Visit                      Blanchard Kelch PT Acute Rehabilitation Services Office [phone]      Rada Hay 05/14/2023, 3:43 PM

## 2023-05-14 NOTE — Progress Notes (Signed)
 Subjective: 1 Day Post-Op s/p Procedure(s): ARTHROPLASTY, KNEE, TOTAL   Patient is alert, oriented. Reports pain was severe overnight, thought she took her oxycodone last night but it apparently did not make it out of her cup. Pain better controlled this morning. Has urinary incontinence at baseline, worried about how she will get to the bathroom/bedside commode when she gets home and she goes to the bathroom every 2 hours.  Denies chest pain, SOB, Calf pain. No nausea/vomiting. No other complaints.    There was some confusion about whether patient was staying the night or discharging but patient ended up being happy she stayed.   Objective:  PE: VITALS:   Vitals:   05/13/23 2211 05/14/23 0155 05/14/23 0158 05/14/23 0546  BP: (!) 151/73 (!) 155/75  (!) 148/62  Pulse: 82 75  71  Resp: 17 18  17   Temp: 98 F (36.7 C) 97.6 F (36.4 C)  97.7 F (36.5 C)  TempSrc: Oral Oral  Oral  SpO2: 96% 90% 92% 96%  Weight:      Height:        Sensation intact distally Intact pulses distally Dorsiflexion/Plantar flexion intact Incision: dressing C/D/I  LABS  Results for orders placed or performed during the hospital encounter of 05/13/23 (from the past 24 hours)  CBC     Status: Abnormal   Collection Time: 05/14/23  3:48 AM  Result Value Ref Range   WBC 17.1 (H) 4.0 - 10.5 K/uL   RBC 4.33 3.87 - 5.11 MIL/uL   Hemoglobin 12.6 12.0 - 15.0 g/dL   HCT 16.1 09.6 - 04.5 %   MCV 95.4 80.0 - 100.0 fL   MCH 29.1 26.0 - 34.0 pg   MCHC 30.5 30.0 - 36.0 g/dL   RDW 40.9 81.1 - 91.4 %   Platelets 297 150 - 400 K/uL   nRBC 0.0 0.0 - 0.2 %  Basic metabolic panel     Status: Abnormal   Collection Time: 05/14/23  3:48 AM  Result Value Ref Range   Sodium 137 135 - 145 mmol/L   Potassium 4.6 3.5 - 5.1 mmol/L   Chloride 105 98 - 111 mmol/L   CO2 25 22 - 32 mmol/L   Glucose, Bld 156 (H) 70 - 99 mg/dL   BUN 20 8 - 23 mg/dL   Creatinine, Ser 7.82 (H) 0.44 - 1.00 mg/dL   Calcium 8.4 (L) 8.9 -  10.3 mg/dL   GFR, Estimated 51 (L) >60 mL/min   Anion gap 7 5 - 15    DG Knee Left Port Result Date: 05/13/2023 CLINICAL DATA:  Status post total left knee arthroplasty. EXAM: PORTABLE LEFT KNEE - 1-2 VIEW COMPARISON:  Left knee radiographs 11/15/2020 FINDINGS: Interval total left knee arthroplasty. No perihardware lucency is seen to indicate hardware failure or loosening. Expected postoperative changes including intra-articular and subcutaneous air. Smalljoint effusion. No acute fracture or dislocation. Mild atherosclerotic calcifications. IMPRESSION: Interval total left knee arthroplasty without evidence of hardware failure. Electronically Signed   By: Neita Garnet M.D.   On: 05/13/2023 16:26    Assessment/Plan: Principal Problem:   S/P total knee arthroplasty, left  1 Day Post-Op s/p Procedure(s): ARTHROPLASTY, KNEE, TOTAL - stable post-op imaging - up with PT, continue incentive spirometry  Weightbearing: WBAT LLE Insicional and dressing care: Reinforce dressings as needed VTE prophylaxis: Aspirin 325mg  BID x 30 days Pain control: continue current regimen, discussed limited IV dilaudid with patient this morning Follow - up plan: 2 weeks with  Dr. Dion Saucier Had extensive discussion regarding plan for getting up to bedside commode/bathroom when she gets home but she will discuss more with PT when they come to work with her.  Dispo: pending progress with PT, patient does not have any stairs at home. Hopeful to discharge her home this afternoon after PT.  Contact information:   Janine Ores, Cordelia Poche [id number] 8-5  After hours and holidays please check [email or web address] for group call information for [facility]  Armida Sans 05/14/2023, 9:18 AM

## 2023-05-14 NOTE — Evaluation (Signed)
 Physical Therapy Evaluation Patient Details Name: Shelly Hickman MRN: [id number] DOB: 04-Aug-1940 Today's Date: 05/14/2023  History of Present Illness  83 yo female S/P L TKA. PMH: RTKA, L THA,, urinary incontinence, Neuromuscular disorder, HTN, anemia, back surgeries.  Clinical Impression  Patient reports pain is 7/10 with WB. Tolerated ambulation    x 20' x 2. Will see how she tolerates  ambulation next visit and if she  meets goals. Patient will have her saon as primary caregiver. Patient is highly incontinent.  Pt admitted with above diagnosis.  Pt currently with functional limitations due to the deficits listed below (see PT Problem List). Pt will benefit from acute skilled PT to increase their independence and safety with mobility to allow discharge.         If plan is discharge home, recommend the following: A little help with walking and/or transfers;A little help with bathing/dressing/bathroom;Assist for transportation;Help with stairs or ramp for entrance   Can travel by private vehicle        Equipment Recommendations None recommended by PT  Recommendations for Other Services       Functional Status Assessment Patient has had a recent decline in their functional status and demonstrates the ability to make significant improvements in function in a reasonable and predictable amount of time.     Precautions / Restrictions Precautions Precautions: Fall;Knee Restrictions LLE Weight Bearing Per Provider Order: Weight bearing as tolerated      Mobility  Bed Mobility Overal bed mobility: Needs Assistance Bed Mobility: Supine to Sit     Supine to sit: Contact guard     General bed mobility comments: avble to move the Left leg to bed edge    Transfers Overall transfer level: Needs assistance Equipment used: Rolling walker (2 wheels) Transfers: Sit to/from Stand Sit to Stand: From elevated surface, Min assist           General transfer comment: able to power  up from bed and toilet    Ambulation/Gait Ambulation/Gait assistance: Min assist Gait Distance (Feet): 20 Feet (x 2) Assistive device: Rolling walker (2 wheels) Gait Pattern/deviations: Step-to pattern, Antalgic Gait velocity: decr     General Gait Details: Patient very antalgic on LLE and reports increased pain with WB  Stairs            Wheelchair Mobility     Tilt Bed    Modified Rankin (Stroke Patients Only)       Balance Overall balance assessment: Mild deficits observed, not formally tested                                           Pertinent Vitals/Pain Pain Assessment Pain Assessment: 0-10 Pain Score: 7  Pain Location: left knee Pain Intervention(s): Limited activity within patient's tolerance, Premedicated before session, Ice applied    Home Living Family/patient expects to be discharged to:: Private residence Living Arrangements: Alone Available Help at Discharge: Family;Available 24 hours/day Type of Home: House Home Access: Ramped entrance       Home Layout: One level Home Equipment: Agricultural consultant (2 wheels);Cane - single point;BSC/3in1;Toilet riser      Prior Function Prior Level of Function : Independent/Modified Independent                     Extremity/Trunk Assessment   Upper Extremity Assessment Upper Extremity Assessment: Overall WFL for tasks assessed  Lower Extremity Assessment Lower Extremity Assessment: LLE deficits/detail LLE Deficits / Details: + SLR, Knee flex to ~ 70    Cervical / Trunk Assessment Cervical / Trunk Assessment: Normal  Communication   Communication Communication: No apparent difficulties Factors Affecting Communication: Hearing impaired    Cognition Arousal: Alert Behavior During Therapy: WFL for tasks assessed/performed   PT - Cognitive impairments: No apparent impairments                                 Cueing       General Comments       Exercises Total Joint Exercises Ankle Circles/Pumps: AROM, Both, 10 reps Quad Sets: AROM, Left, 5 reps Hip ABduction/ADduction: AROM, Left, 5 reps Straight Leg Raises: AAROM, Left, 5 reps   Assessment/Plan    PT Assessment Patient needs continued PT services  PT Problem List Decreased strength;Decreased range of motion;Decreased activity tolerance;Decreased mobility;Pain       PT Treatment Interventions DME instruction;Therapeutic exercise;Gait training;Functional mobility training;Therapeutic activities;Patient/family education    PT Goals (Current goals can be found in the Care Plan section)  Acute Rehab PT Goals Patient Stated Goal: go home PT Goal Formulation: With patient Time For Goal Achievement: 05/21/23 Potential to Achieve Goals: Good    Frequency 7X/week     Co-evaluation               AM-PAC PT "6 Clicks" Mobility  Outcome Measure Help needed turning from your back to your side while in a flat bed without using bedrails?: A Little Help needed moving from lying on your back to sitting on the side of a flat bed without using bedrails?: A Little Help needed moving to and from a bed to a chair (including a wheelchair)?: A Little Help needed standing up from a chair using your arms (e.g., wheelchair or bedside chair)?: A Little Help needed to walk in hospital room?: A Little Help needed climbing 3-5 steps with a railing? : A Lot 6 Click Score: 17    End of Session Equipment Utilized During Treatment: Gait belt Activity Tolerance: Patient limited by pain Patient left: in chair;with call bell/phone within reach Nurse Communication: Mobility status PT Visit Diagnosis: Unsteadiness on feet (R26.81);Other abnormalities of gait and mobility (R26.89);Pain Pain - Right/Left: Left Pain - part of body: Knee    Time: 2841-3244 PT Time Calculation (min) (ACUTE ONLY): 32 min   Charges:   PT Evaluation $PT Eval Low Complexity: 1 Low PT Treatments $Gait  Training: 8-22 mins PT General Charges $$ ACUTE PT VISIT: 1 Visit         Blanchard Kelch PT Acute Rehabilitation Services Office [phone] Weekend pager-[phone]   Rada Hay 05/14/2023, 1:25 PM

## 2023-05-14 NOTE — Plan of Care (Signed)

## 2023-05-14 NOTE — TOC Transition Note (Signed)
 Transition of Care [facility]) - Discharge Note   Patient Details  Name: Shelly Hickman MRN: [id number] Date of Birth: 06/21/1940  Transition of [facility]) CM/SW Contact:  Howell Rucks, RN Phone Number: 05/14/2023, 10:50 AM   Clinical Narrative:   Met with pt at bedside to review dc therapy and home equipment needs, pt confirmed HH PT x 4 visits, then OPPT, pt confirms she has a RW, no home DME needs. No TOC needs.     Final next level of care: Home w Home Health Services Barriers to Discharge: No Barriers Identified   Patient Goals and CMS Choice Patient states their goals for this hospitalization and ongoing recovery are:: return home          Discharge Placement                       Discharge Plan and Services Additional resources added to the After Visit Summary for                  DME Arranged: CPM DME Agency: [employer]                  Social Drivers of Health (SDOH) Interventions SDOH Screenings   Food Insecurity: No Food Insecurity (05/13/2023)  Housing: Low Risk  (05/13/2023)  Transportation Needs: No Transportation Needs (05/13/2023)  Utilities: Not At Risk (05/13/2023)  Alcohol Screen: Low Risk  (05/31/2022)  Depression (PHQ2-9): Low Risk  (04/14/2023)  Financial Resource Strain: Medium Risk (05/31/2022)  Physical Activity: Inactive (05/31/2022)  Social Connections: Moderately Isolated (05/13/2023)  Stress: No Stress Concern Present (05/31/2022)  Tobacco Use: Medium Risk (05/13/2023)     Readmission Risk Interventions     No data to display

## 2023-05-15 DIAGNOSIS — Z833 Family history of diabetes mellitus: Secondary | ICD-10-CM | POA: Diagnosis not present

## 2023-05-15 DIAGNOSIS — Z811 Family history of alcohol abuse and dependence: Secondary | ICD-10-CM | POA: Diagnosis not present

## 2023-05-15 DIAGNOSIS — Z91030 Bee allergy status: Secondary | ICD-10-CM | POA: Diagnosis not present

## 2023-05-15 DIAGNOSIS — Z96652 Presence of left artificial knee joint: Secondary | ICD-10-CM

## 2023-05-15 DIAGNOSIS — Z8249 Family history of ischemic heart disease and other diseases of the circulatory system: Secondary | ICD-10-CM | POA: Diagnosis not present

## 2023-05-15 DIAGNOSIS — Z5986 Financial insecurity: Secondary | ICD-10-CM | POA: Diagnosis not present

## 2023-05-15 DIAGNOSIS — Z981 Arthrodesis status: Secondary | ICD-10-CM | POA: Diagnosis not present

## 2023-05-15 DIAGNOSIS — F32A Depression, unspecified: Secondary | ICD-10-CM | POA: Diagnosis present

## 2023-05-15 DIAGNOSIS — Z87891 Personal history of nicotine dependence: Secondary | ICD-10-CM | POA: Diagnosis not present

## 2023-05-15 DIAGNOSIS — Z79818 Long term (current) use of other agents affecting estrogen receptors and estrogen levels: Secondary | ICD-10-CM | POA: Diagnosis not present

## 2023-05-15 DIAGNOSIS — Z823 Family history of stroke: Secondary | ICD-10-CM | POA: Diagnosis not present

## 2023-05-15 DIAGNOSIS — E7800 Pure hypercholesterolemia, unspecified: Secondary | ICD-10-CM | POA: Diagnosis present

## 2023-05-15 DIAGNOSIS — Z818 Family history of other mental and behavioral disorders: Secondary | ICD-10-CM | POA: Diagnosis not present

## 2023-05-15 DIAGNOSIS — E039 Hypothyroidism, unspecified: Secondary | ICD-10-CM | POA: Diagnosis present

## 2023-05-15 DIAGNOSIS — Z881 Allergy status to other antibiotic agents status: Secondary | ICD-10-CM | POA: Diagnosis not present

## 2023-05-15 DIAGNOSIS — R32 Unspecified urinary incontinence: Secondary | ICD-10-CM | POA: Diagnosis present

## 2023-05-15 DIAGNOSIS — Z79890 Hormone replacement therapy (postmenopausal): Secondary | ICD-10-CM | POA: Diagnosis not present

## 2023-05-15 DIAGNOSIS — Z7982 Long term (current) use of aspirin: Secondary | ICD-10-CM | POA: Diagnosis not present

## 2023-05-15 DIAGNOSIS — Z96642 Presence of left artificial hip joint: Secondary | ICD-10-CM | POA: Diagnosis present

## 2023-05-15 DIAGNOSIS — Z79899 Other long term (current) drug therapy: Secondary | ICD-10-CM | POA: Diagnosis not present

## 2023-05-15 DIAGNOSIS — M1712 Unilateral primary osteoarthritis, left knee: Secondary | ICD-10-CM | POA: Diagnosis present

## 2023-05-15 DIAGNOSIS — I10 Essential (primary) hypertension: Secondary | ICD-10-CM | POA: Diagnosis present

## 2023-05-15 DIAGNOSIS — Z96651 Presence of right artificial knee joint: Secondary | ICD-10-CM | POA: Diagnosis present

## 2023-05-15 LAB — CBC
HCT: 40.5 % (ref 36.0–46.0)
Hemoglobin: 12.3 g/dL (ref 12.0–15.0)
MCH: 28.7 pg (ref 26.0–34.0)
MCHC: 30.4 g/dL (ref 30.0–36.0)
MCV: 94.6 fL (ref 80.0–100.0)
Platelets: 264 10*3/uL (ref 150–400)
RBC: 4.28 MIL/uL (ref 3.87–5.11)
RDW: 14 % (ref 11.5–15.5)
WBC: 21.4 10*3/uL — ABNORMAL HIGH (ref 4.0–10.5)
nRBC: 0 % (ref 0.0–0.2)

## 2023-05-15 MED ORDER — AMLODIPINE BESYLATE 5 MG PO TABS
5.0000 mg | ORAL_TABLET | Freq: Every day | ORAL | Status: DC
  Administered 2023-05-15 – 2023-05-19 (×5): 5 mg via ORAL
  Filled 2023-05-15 (×5): qty 1

## 2023-05-15 MED ORDER — GABAPENTIN 300 MG PO CAPS
300.0000 mg | ORAL_CAPSULE | Freq: Every day | ORAL | Status: DC
  Administered 2023-05-15 – 2023-05-18 (×4): 300 mg via ORAL
  Filled 2023-05-15 (×4): qty 1

## 2023-05-15 MED ORDER — ACETAMINOPHEN 500 MG PO TABS
500.0000 mg | ORAL_TABLET | ORAL | Status: DC
  Administered 2023-05-15 – 2023-05-19 (×23): 500 mg via ORAL
  Filled 2023-05-15 (×24): qty 1

## 2023-05-15 MED ORDER — HYDRALAZINE HCL 20 MG/ML IJ SOLN
10.0000 mg | INTRAMUSCULAR | Status: DC | PRN
Start: 2023-05-15 — End: 2023-05-18
  Administered 2023-05-15 – 2023-05-18 (×2): 10 mg via INTRAVENOUS
  Filled 2023-05-15 (×2): qty 1

## 2023-05-15 MED ORDER — OXYCODONE HCL 5 MG PO TABS
5.0000 mg | ORAL_TABLET | ORAL | Status: DC
  Administered 2023-05-15 – 2023-05-16 (×6): 5 mg via ORAL
  Filled 2023-05-15 (×6): qty 1

## 2023-05-15 MED ORDER — ASPIRIN 325 MG PO TBEC
325.0000 mg | DELAYED_RELEASE_TABLET | Freq: Two times a day (BID) | ORAL | Status: DC
  Administered 2023-05-15 – 2023-05-19 (×9): 325 mg via ORAL
  Filled 2023-05-15 (×9): qty 1

## 2023-05-15 MED ORDER — OXYCODONE HCL 5 MG PO TABS
5.0000 mg | ORAL_TABLET | ORAL | Status: DC | PRN
  Administered 2023-05-15 – 2023-05-16 (×6): 5 mg via ORAL
  Filled 2023-05-15 (×6): qty 1

## 2023-05-15 MED ORDER — LOSARTAN POTASSIUM 50 MG PO TABS
100.0000 mg | ORAL_TABLET | Freq: Every day | ORAL | Status: DC
  Administered 2023-05-15 – 2023-05-19 (×5): 100 mg via ORAL
  Filled 2023-05-15 (×5): qty 2

## 2023-05-15 MED ORDER — POLYETHYLENE GLYCOL 3350 17 G PO PACK
17.0000 g | PACK | Freq: Once | ORAL | Status: AC
  Administered 2023-05-15: 17 g via ORAL
  Filled 2023-05-15: qty 1

## 2023-05-15 MED ORDER — CELECOXIB 100 MG PO CAPS
100.0000 mg | ORAL_CAPSULE | Freq: Two times a day (BID) | ORAL | Status: DC
  Administered 2023-05-15 – 2023-05-19 (×8): 100 mg via ORAL
  Filled 2023-05-15 (×10): qty 1

## 2023-05-15 NOTE — Progress Notes (Signed)
     Subjective: 2 Days Post-Op s/p Procedure(s): ARTHROPLASTY, KNEE, TOTAL   Patient is alert, oriented. Still struggling with pain control today and last night. Feel likes her kidneys hurt. Voiding well, no bowel movement yet.  Denies chest pain, SOB, Calf pain. No nausea/vomiting. No other complaints. Eager to discharge home today.   Objective:  PE: VITALS:   Vitals:   05/14/23 0925 05/14/23 1309 05/14/23 2129 05/15/23 0612  BP: (!) 97/54 (!) 160/89 (!) 159/80 (!) 190/88  Pulse:  72 71 86  Resp: 16 16 18 16   Temp: 98.2 F (36.8 C) 97.8 F (36.6 C) 98.8 F (37.1 C) 97.7 F (36.5 C)  TempSrc:   Oral Oral  SpO2: 95% 92% 92% 93%  Weight:      Height:        Sensation intact distally Intact pulses distally Dorsiflexion/Plantar flexion intact Incision: dressing C/D/I  LABS  Results for orders placed or performed during the hospital encounter of 05/13/23 (from the past 24 hours)  CBC     Status: Abnormal   Collection Time: 05/15/23  3:43 AM  Result Value Ref Range   WBC 21.4 (H) 4.0 - 10.5 K/uL   RBC 4.28 3.87 - 5.11 MIL/uL   Hemoglobin 12.3 12.0 - 15.0 g/dL   HCT 16.1 09.6 - 04.5 %   MCV 94.6 80.0 - 100.0 fL   MCH 28.7 26.0 - 34.0 pg   MCHC 30.4 30.0 - 36.0 g/dL   RDW 40.9 81.1 - 91.4 %   Platelets 264 150 - 400 K/uL   nRBC 0.0 0.0 - 0.2 %    DG Knee Left Port Result Date: 05/13/2023 CLINICAL DATA:  Status post total left knee arthroplasty. EXAM: PORTABLE LEFT KNEE - 1-2 VIEW COMPARISON:  Left knee radiographs 11/15/2020 FINDINGS: Interval total left knee arthroplasty. No perihardware lucency is seen to indicate hardware failure or loosening. Expected postoperative changes including intra-articular and subcutaneous air. Smalljoint effusion. No acute fracture or dislocation. Mild atherosclerotic calcifications. IMPRESSION: Interval total left knee arthroplasty without evidence of hardware failure. Electronically Signed   By: Neita Garnet M.D.   On: 05/13/2023 16:26     Assessment/Plan: Principal Problem:   S/P total knee arthroplasty, left  2 Days Post-Op s/p Procedure(s): ARTHROPLASTY, KNEE, TOTAL - stable post-op imaging - up with PT, continue incentive spirometry - BP elevated this morning, will be receiving BP meds shortly  Weightbearing: WBAT LLE Insicional and dressing care: Reinforce dressings as needed VTE prophylaxis: Aspirin 325mg  BID x 30 days Pain control: Will change to scheduled oxycodone and tylenol with prn oxycodone 5 on top of this, hopeful that we can get a little better pain control today Follow - up plan: 2 weeks with Dr. Dion Saucier  Dispo: pending progress with PT,  Hopeful to discharge her home this afternoon after PT.  Contact information:   Janine Ores, Cordelia Poche [id number] 8-5  After hours and holidays please check [email or web address] for group call information for [facility]  Armida Sans 05/15/2023, 7:08 AM

## 2023-05-15 NOTE — TOC Progression Note (Signed)
 Transition of Care [facility]) - Progression Note    Patient Details  Name: Shelly Hickman MRN: [id number] Date of Birth: 01-01-1941  Transition [facility]) CM/SW Contact  Howell Rucks, RN Phone Number: 05/15/2023, 3:39 PM  Clinical Narrative:  [facility] consult received for SNF placement, PT eval recommendation for SNF as well. Teams chat sent to  Janine Ores, Ortho PA, requesting confirmation to proceed with SNF work up as patient is an Ortho Bundle, await confirmation.       Barriers to Discharge: No Barriers Identified  Expected Discharge Plan and Services         Expected Discharge Date: 05/13/23               DME Arranged: CPM DME Agency: [employer]                   Social Determinants of Health (SDOH) Interventions SDOH Screenings   Food Insecurity: No Food Insecurity (05/13/2023)  Housing: Low Risk  (05/13/2023)  Transportation Needs: No Transportation Needs (05/13/2023)  Utilities: Not At Risk (05/13/2023)  Alcohol Screen: Low Risk  (05/31/2022)  Depression (PHQ2-9): Low Risk  (04/14/2023)  Financial Resource Strain: Medium Risk (05/31/2022)  Physical Activity: Inactive (05/31/2022)  Social Connections: Moderately Isolated (05/13/2023)  Stress: No Stress Concern Present (05/31/2022)  Tobacco Use: Medium Risk (05/13/2023)    Readmission Risk Interventions     No data to display

## 2023-05-15 NOTE — Progress Notes (Signed)
 Physical Therapy Treatment Patient Details Name: Shelly Hickman MRN: [id number] DOB: July 28, 1940 Today's Date: 05/15/2023   History of Present Illness 83 yo female S/P L TKA. PMH: RTKA, L THA,, urinary incontinence, Neuromuscular disorder, HTN, anemia, back surgeries.    PT Comments  Patient progress this visit limited  with reports of medial left knee pain. Patient has had pain medication.  Patient assisted to sitting and standing with incontinence, requiring transfer to [facility], then needing hygiene and  clean pads. Patient  required steady support for balance  as patient did not tolerate WB on the LLE .   Patient then took a few steps along the bed.  Patient  does put forth the effort.  Patient's son and primary caregiver present  during PT visit. Patient currently has  not met PT goals for safe DC . Recommend SNF if eligible. PT has communicated concerns with MD via chat.   If plan is discharge home, recommend the following: A little help with walking and/or transfers;Assist for transportation;Help with stairs or ramp for entrance;A lot of help with bathing/dressing/bathroom;Assistance with cooking/housework   Can travel by private vehicle        Equipment Recommendations  None recommended by PT    Recommendations for Other Services       Precautions / Restrictions Precautions Precautions: Fall;Knee Required Braces or Orthoses: Knee Immobilizer - Left Knee Immobilizer - Left: Other (comment) (use as needed) Restrictions LLE Weight Bearing Per Provider Order: Weight bearing as tolerated     Mobility  Bed Mobility Overal bed mobility: Needs Assistance Bed Mobility: Supine to Sit, Sit to Supine     Supine to sit: Mod assist Sit to supine: Min assist   General bed mobility comments: practiced exit to the left, use of belt for LLE support, required mod support to sit upright and support LLE while patient pushed to bed edge. Assist LLE onto bed , pt. using belt     Transfers Overall transfer level: Needs assistance Equipment used: Rolling walker (2 wheels) Transfers: Sit to/from Stand, Bed to chair/wheelchair/BSC Sit to Stand: Min assist, Mod assist   Step pivot transfers: Mod assist       General transfer comment: patient very antalgic on the LLE, ni KI in place this visit. Patient scoots on right foot to turn , decreased WB on the LLE. Transferred to [facility]. Able to step  x 4 to move to  [facility] prior to sitting down. Cues for LLE position    Ambulation/Gait   Gait Distance (Feet): 30 Feet Assistive device: Rolling walker (2 wheels) Gait Pattern/deviations: Step-to pattern, Antalgic Gait velocity: decr     General Gait Details: unable to tolerate per patient due to L knee pain   Stairs             Wheelchair Mobility     Tilt Bed    Modified Rankin (Stroke Patients Only)       Balance Overall balance assessment: Needs assistance   Sitting balance-Leahy Scale: Good     Standing balance support: Bilateral upper extremity supported, During functional activity, Reliant on assistive device for balance Standing balance-Leahy Scale: Fair Standing balance comment: has to balance self to pull down briefs and  soiled pad and then pull up the   briefs and place a pad, required mod support for  balance when UE's are not supported on RW  Communication Communication Communication: Impaired Factors Affecting Communication: Hearing impaired  Cognition Arousal: Alert Behavior During Therapy: Anxious, Restless, WFL for tasks assessed/performed   PT - Cognitive impairments: No apparent impairments                       PT - Cognition Comments: patient appears more anxious, does push her self to try to progress but limited by pain per patient, becomes emotional Following commands: Intact      Cueing    Exercises Total Joint Exercises Heel Slides: AAROM, Left, 10 reps Straight Leg  Raises: AAROM, Left, 10 reps Goniometric ROM: 10-50 left knee flex    General Comments        Pertinent Vitals/Pain Pain Assessment Pain Score: 9  Pain Location: medial left knee below patella when WB, and right wrist with WB Pain Descriptors / Indicators: Grimacing, Discomfort, Guarding, Crying Pain Intervention(s): Limited activity within patient's tolerance, Monitored during session, Premedicated before session, Repositioned, Ice applied    Home Living                          Prior Function            PT Goals (current goals can now be found in the care plan section) Progress towards PT goals: Not progressing toward goals - comment (reports pain left knee)    Frequency    7X/week      PT Plan      Co-evaluation              AM-PAC PT "6 Clicks" Mobility   Outcome Measure  Help needed turning from your back to your side while in a flat bed without using bedrails?: A Little Help needed moving from lying on your back to sitting on the side of a flat bed without using bedrails?: A Little Help needed moving to and from a bed to a chair (including a wheelchair)?: A Lot Help needed standing up from a chair using your arms (e.g., wheelchair or bedside chair)?: A Lot Help needed to walk in hospital room?: A Lot Help needed climbing 3-5 steps with a railing? : Total 6 Click Score: 13    End of Session Equipment Utilized During Treatment: Gait belt Activity Tolerance: Patient limited by pain Patient left: in bed;with call bell/phone within reach;with family/visitor present Nurse Communication: Mobility status PT Visit Diagnosis: Unsteadiness on feet (R26.81);Other abnormalities of gait and mobility (R26.89);Pain;Muscle weakness (generalized) (M62.81) Pain - Right/Left: Left Pain - part of body: Knee     Time: 6387-5643 PT Time Calculation (min) (ACUTE ONLY): 48 min  Charges:    $Therapeutic Exercise: 8-22 mins $Therapeutic Activity: 8-22  mins $Self Care/Home Management: 8-22 PT General Charges $$ ACUTE PT VISIT: 1 Visit                    Blanchard Kelch PT Acute Rehabilitation Services Office [phone]    Rada Hay 05/15/2023, 3:33 PM

## 2023-05-15 NOTE — Plan of Care (Signed)

## 2023-05-15 NOTE — Progress Notes (Signed)
 Physical Therapy Treatment Patient Details Name: Shelly Hickman MRN: [id number] DOB: June 15, 1940 Today's Date: 05/15/2023   History of Present Illness 83 yo female S/P L TKA. PMH: RTKA, L THA,, urinary incontinence, Neuromuscular disorder, HTN, anemia, back surgeries.    PT Comments  The patient is struggling more today with mobility. Patient has been premedicated for pain, rating L knee pain 10/10 with WB abd when lifting the leg from bed. KI used today to attempt more support to lessen  discomfort on LLE  Patient also demonstrates low endurance and is quite  SOB after short distances.  Patient could benefit from a short rehab  stay to improve functional independence. Patient has severe urinary incontinence and has to change her urinary incontinence pads several times per day, requiring her to balance and manage the briefs. She is noted to be unbalanced when perming this task.    If plan is discharge home, recommend the following: A little help with walking and/or transfers;A little help with bathing/dressing/bathroom;Assist for transportation;Help with stairs or ramp for entrance   Can travel by private vehicle        Equipment Recommendations  None recommended by PT    Recommendations for Other Services       Precautions / Restrictions Precautions Precautions: Fall;Knee Required Braces or Orthoses: Knee Immobilizer - Left Knee Immobilizer - Left: On when out of bed or walking Restrictions LLE Weight Bearing Per Provider Order: Weight bearing as tolerated Other Position/Activity Restrictions: Placed KI  on LLE as patient reporting significant pain.     Mobility  Bed Mobility   Bed Mobility: Supine to Sit     Supine to sit: Contact guard     General bed mobility comments: practiced with the belt, no rails, Patient struggles  with out use of rails    Transfers Overall transfer level: Needs assistance Equipment used: Rolling walker (2 wheels) Transfers: Sit to/from  Stand Sit to Stand: From elevated surface, Min assist           General transfer comment: able to power up from the raised bed with effort now with KI in place    Ambulation/Gait   Gait Distance (Feet): 30 Feet Assistive device: Rolling walker (2 wheels) Gait Pattern/deviations: Step-to pattern, Antalgic Gait velocity: decr     General Gait Details: Patient very antalgic on LLE and reports increased pain with WB   Stairs             Wheelchair Mobility     Tilt Bed    Modified Rankin (Stroke Patients Only)       Balance Overall balance assessment: Needs assistance   Sitting balance-Leahy Scale: Good     Standing balance support: Bilateral upper extremity supported, During functional activity, Reliant on assistive device for balance Standing balance-Leahy Scale: Fair                              Musician Factors Affecting Communication: Hearing impaired  Cognition Arousal: Alert Behavior During Therapy: Anxious, Restless, WFL for tasks assessed/performed   PT - Cognitive impairments: No apparent impairments                       PT - Cognition Comments: patient appears more anxious, does push her self to try to progress but limited by painper patient, becomes emotional Following commands: Intact      Cueing    Exercises  General Comments        Pertinent Vitals/Pain Pain Assessment Pain Score: 8  Pain Location: left knee when WB, and right wrist with WB Pain Descriptors / Indicators: Grimacing, Discomfort, Guarding, Crying Pain Intervention(s): Limited activity within patient's tolerance, Monitored during session, Premedicated before session, Repositioned    Home Living                          Prior Function            PT Goals (current goals can now be found in the care plan section) Progress towards PT goals: Progressing toward goals    Frequency    7X/week      PT  Plan      Co-evaluation              AM-PAC PT "6 Clicks" Mobility   Outcome Measure    Help needed moving from lying on your back to sitting on the side of a flat bed without using bedrails?: A Lot Help needed moving to and from a bed to a chair (including a wheelchair)?: A Little Help needed standing up from a chair using your arms (e.g., wheelchair or bedside chair)?: A Lot Help needed to walk in hospital room?: A Little Help needed climbing 3-5 steps with a railing? : A Lot 6 Click Score: 12    End of Session Equipment Utilized During Treatment: Gait belt Activity Tolerance: Patient limited by pain Patient left: in chair;with call bell/phone within reach Nurse Communication: Mobility status PT Visit Diagnosis: Unsteadiness on feet (R26.81);Other abnormalities of gait and mobility (R26.89);Pain Pain - Right/Left: Left Pain - part of body: Knee     Time: 0830-0901 PT Time Calculation (min) (ACUTE ONLY): 31 min  Charges:    $Gait Training: 8-22 mins $Therapeutic Activity: 8-22 mins PT General Charges $$ ACUTE PT VISIT: 1 Visit                     Blanchard Kelch PT Acute Rehabilitation Services Office [phone] Weekend pager-[phone]    Rada Hay 05/15/2023, 9:15 AM

## 2023-05-16 ENCOUNTER — Inpatient Hospital Stay (HOSPITAL_COMMUNITY)

## 2023-05-16 LAB — CBC
HCT: 42.7 % (ref 36.0–46.0)
Hemoglobin: 13.1 g/dL (ref 12.0–15.0)
MCH: 28.9 pg (ref 26.0–34.0)
MCHC: 30.7 g/dL (ref 30.0–36.0)
MCV: 94.3 fL (ref 80.0–100.0)
Platelets: 320 10*3/uL (ref 150–400)
RBC: 4.53 MIL/uL (ref 3.87–5.11)
RDW: 14.4 % (ref 11.5–15.5)
WBC: 16.5 10*3/uL — ABNORMAL HIGH (ref 4.0–10.5)
nRBC: 0 % (ref 0.0–0.2)

## 2023-05-16 LAB — URINALYSIS, ROUTINE W REFLEX MICROSCOPIC
Bacteria, UA: NONE SEEN
Glucose, UA: NEGATIVE mg/dL
Hgb urine dipstick: NEGATIVE
Ketones, ur: NEGATIVE mg/dL
Nitrite: NEGATIVE
Protein, ur: NEGATIVE mg/dL
Specific Gravity, Urine: 1.031 — ABNORMAL HIGH (ref 1.005–1.030)
pH: 5 (ref 5.0–8.0)

## 2023-05-16 LAB — BASIC METABOLIC PANEL WITH GFR
Anion gap: 12 (ref 5–15)
BUN: 20 mg/dL (ref 8–23)
CO2: 25 mmol/L (ref 22–32)
Calcium: 8.5 mg/dL — ABNORMAL LOW (ref 8.9–10.3)
Chloride: 94 mmol/L — ABNORMAL LOW (ref 98–111)
Creatinine, Ser: 0.76 mg/dL (ref 0.44–1.00)
GFR, Estimated: >60 mL/min (ref >60)
Glucose, Bld: 97 mg/dL (ref 70–99)
Potassium: 4.5 mmol/L (ref 3.5–5.1)
Sodium: 131 mmol/L — ABNORMAL LOW (ref 135–145)

## 2023-05-16 MED ORDER — OXYCODONE HCL 5 MG PO TABS: 10.0000 mg | ORAL_TABLET | ORAL | Status: DC | PRN

## 2023-05-16 MED ORDER — OXYCODONE HCL 5 MG PO TABS
5.0000 mg | ORAL_TABLET | ORAL | Status: DC | PRN
  Administered 2023-05-16 – 2023-05-19 (×5): 5 mg via ORAL
  Filled 2023-05-16 (×5): qty 1

## 2023-05-16 MED ORDER — OXYCODONE HCL 5 MG PO TABS: 5.0000 mg | ORAL_TABLET | ORAL | 0 refills | Status: DC | PRN

## 2023-05-16 MED ORDER — SODIUM CHLORIDE 0.9 % IV SOLN: INTRAVENOUS | Status: AC

## 2023-05-16 NOTE — Progress Notes (Signed)
 Patient's oxygen saturation 87% on room air. Patient placed on 2 liters nasal canula with a resulting oxygen saturation of 93%.

## 2023-05-16 NOTE — NC FL2 (Signed)
 [place] MEDICAID [facility] LEVEL OF CARE FORM     IDENTIFICATION  Patient Name: Shelly Hickman Birthdate: 02/22/1940 Sex: female Admission Date (Current Location): 05/13/2023  [facility] and [place] Number:  Producer, television/film/video and Address:  [facility],  [address] [place], [place] [address]      Provider Number: [id number]  Attending Physician Name and Address:  Teryl Lucy, MD  Relative Name and Phone Number:  Aamori, Mcmasters (Son)  [phone] [facility])    Current Level of Care: Hospital Recommended Level of Care: Skilled Nursing Facility Prior Approval Number:    Date Approved/Denied:   PASRR Number: [id number]  Discharge Plan: SNF    Current Diagnoses: Patient Active Problem List   Diagnosis Date Noted   S/P TKR (total knee replacement), left 05/15/2023   S/P total knee arthroplasty, left 05/13/2023   Leg edema 12/05/2022   Hemorrhoids without complication 07/18/2021   History of colonic polyps 07/18/2021   Onychomycosis of toenail 10/15/2018   Hammer toe of left foot 10/15/2018   Acquired hypothyroidism 12/17/2017   Bruit of left carotid artery 09/11/2017   Claw toe, acquired, left 05/29/2017   Morton neuroma, left 01/20/2017   Primary localized osteoarthritis of right knee 12/12/2015   Spinal stenosis in cervical region 10/18/2015   Spinal stenosis of lumbar region 10/18/2015   Morbid obesity (HCC) 09/09/2015   Prediabetes 09/06/2015   Memory impairment 08/30/2015   Vitamin D deficiency 08/30/2015   Mixed hyperlipidemia 08/30/2015   Incontinence of urine 09/05/2013   Status post left hip replacement 07/14/2013   Allergic rhinitis 07/14/2013   Anxiety 07/14/2013   Insomnia 07/14/2013   Irritable bowel syndrome with both constipation and diarrhea 07/14/2013   GERD (gastroesophageal reflux disease) 12/10/2012   Osteopenia 06/27/2011   Benign hypertension 03/26/2011   Depression 03/26/2011   Epiphora 02/04/2011   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   Degenerative joint disease involving multiple joints 04/06/2010    Orientation RESPIRATION BLADDER Height & Weight     Self, Time, Situation, Place  O2 Incontinent, External catheter Weight: 102 kg Height:  5\' 6"  (167.6 cm)  BEHAVIORAL SYMPTOMS/MOOD NEUROLOGICAL BOWEL NUTRITION STATUS      Continent Diet (Regular diet)  AMBULATORY STATUS COMMUNICATION OF NEEDS Skin   Limited Assist Verbally Surgical wounds (Left Total Knee Arthroplasty)                       Personal Care Assistance Level of Assistance  Bathing, Feeding, Dressing Bathing Assistance: Limited assistance Feeding assistance: Limited assistance Dressing Assistance: Limited assistance     Functional Limitations Info  Sight, Hearing, Speech Sight Info: Impaired Hearing Info: Impaired Speech Info: Adequate    SPECIAL CARE FACTORS FREQUENCY  PT (By licensed PT), OT (By licensed OT)     PT Frequency: 5x/wk OT Frequency: 5x/wk            Contractures Contractures Info: Not present    Additional Factors Info  Code Status Code Status Info: Full Code             Current Medications (05/16/2023):  This is the current hospital active medication list Current Facility-Administered Medications  Medication Dose Route Frequency Provider Last Rate Last Admin   acetaminophen (TYLENOL) tablet 500 mg  500 mg Oral Q4H Brown, Blaine K, PA-C   500 mg at 05/16/23 0442   alum & mag hydroxide-simeth (MAALOX/MYLANTA) 200-200-20 MG/5ML suspension 30 mL  30 mL Oral Q4H PRN Armida Sans, PA-C  amLODipine (NORVASC) tablet 5 mg  5 mg Oral Daily Armida Sans, PA-C   5 mg at 05/15/23 1610   aspirin EC tablet 325 mg  325 mg Oral BID Armida Sans, PA-C   325 mg at 05/15/23 2047   bisacodyl (DULCOLAX) suppository 10 mg  10 mg Rectal Daily PRN Armida Sans, PA-C   10 mg at 05/15/23 9604   celecoxib (CELEBREX) capsule 100 mg  100 mg Oral BID Armida Sans, PA-C   100 mg at 05/15/23 2239    diphenhydrAMINE (BENADRYL) 12.5 MG/5ML elixir 12.5-25 mg  12.5-25 mg Oral Q4H PRN Janine Ores K, PA-C       docusate sodium (COLACE) capsule 100 mg  100 mg Oral BID Janine Ores K, PA-C   100 mg at 05/15/23 2046   DULoxetine (CYMBALTA) DR capsule 120 mg  120 mg Oral Daily Armida Sans, PA-C   120 mg at 05/15/23 0810   gabapentin (NEURONTIN) capsule 300 mg  300 mg Oral QHS Janine Ores K, PA-C   300 mg at 05/15/23 2049   hydrALAZINE (APRESOLINE) injection 10 mg  10 mg Intravenous Q4H PRN Janine Ores K, PA-C   10 mg at 05/15/23 1832   HYDROmorphone (DILAUDID) injection 0.5-1 mg  0.5-1 mg Intravenous Q4H PRN Janine Ores K, PA-C   1 mg at 05/15/23 1926   lactated ringers bolus 250 mL  250 mL Intravenous Once Armida Sans, PA-C   Held at 05/13/23 5409   levothyroxine (SYNTHROID) tablet 25 mcg  25 mcg Oral QAC breakfast Armida Sans, PA-C   25 mcg at 05/16/23 0602   losartan (COZAAR) tablet 100 mg  100 mg Oral Daily Armida Sans, PA-C   100 mg at 05/15/23 8119   magnesium citrate solution 1 Bottle  1 Bottle Oral Once PRN Armida Sans, PA-C       menthol-cetylpyridinium (CEPACOL) lozenge 3 mg  1 lozenge Oral PRN Janine Ores K, PA-C       Or   phenol (CHLORASEPTIC) mouth spray 1 spray  1 spray Mouth/Throat PRN Janine Ores K, PA-C       methocarbamol (ROBAXIN) tablet 500 mg  500 mg Oral Q6H PRN Janine Ores K, PA-C   500 mg at 05/15/23 1904   Or   methocarbamol (ROBAXIN) injection 500 mg  500 mg Intravenous Q6H PRN Janine Ores K, PA-C       metoCLOPramide (REGLAN) tablet 5-10 mg  5-10 mg Oral Q8H PRN Janine Ores K, PA-C       Or   metoCLOPramide (REGLAN) injection 5-10 mg  5-10 mg Intravenous Q8H PRN Janine Ores K, PA-C       mirabegron ER (MYRBETRIQ) tablet 25 mg  25 mg Oral QHS Janine Ores K, PA-C   25 mg at 05/15/23 2045   ondansetron (ZOFRAN) tablet 4 mg  4 mg Oral Q6H PRN Janine Ores K, PA-C       Or   ondansetron [facility]) injection 4 mg  4 mg Intravenous Q6H  PRN Janine Ores K, PA-C       oxyCODONE (Oxy IR/ROXICODONE) immediate release tablet 5 mg  5 mg Oral Q4H Janine Ores K, PA-C   5 mg at 05/16/23 0441   oxyCODONE (Oxy IR/ROXICODONE) immediate release tablet 5 mg  5 mg Oral Q4H PRN Janine Ores K, PA-C   5 mg at 05/16/23 0442   polyethylene glycol (MIRALAX / GLYCOLAX) packet 17 g  17 g Oral Daily PRN  Janine Ores K, PA-C       simvastatin (ZOCOR) tablet 20 mg  20 mg Oral QHS Janine Ores K, PA-C   20 mg at 05/15/23 2046   sodium chloride flush (NS) 0.9 % injection 3-10 mL  3-10 mL Intravenous Q12H Armida Sans, PA-C   3 mL at 05/15/23 2051   sodium chloride flush (NS) 0.9 % injection 3-10 mL  3-10 mL Intravenous PRN Armida Sans, PA-C         Discharge Medications: Please see discharge summary for a list of discharge medications.  Relevant Imaging Results:  Relevant Lab Results:   Additional Information SSN: [id number]  Howell Rucks, RN

## 2023-05-16 NOTE — Progress Notes (Signed)
 Subjective: 3 Days Post-Op s/p Procedure(s): ARTHROPLASTY, KNEE, TOTAL   Patient is alert, oriented. Looks and feels better this morning. Feels like pain medication is making her groggy. Put back on O2 last night.  Voiding well, urinary incontinence at baseline. Denies chest pain, SOB, Calf pain. No nausea/vomiting. Agreeable to SNF level care at discharge.  Objective:  PE: VITALS:   Vitals:   05/15/23 2354 05/16/23 0554 05/16/23 0559 05/16/23 0858  BP: (!) 156/73 (!) 146/61  (!) 159/88  Pulse: 92 85 80 93  Resp: 17 14  20   Temp: 98.6 F (37 C) 97.8 F (36.6 C)    TempSrc: Oral Oral    SpO2: 93% (!) 87% 93% 96%  Weight:      Height:       Alert, sitting up in chair eating breakfast. In no acute distress, better color today Not on O2, no increased work of breathing Sensation intact distally Intact pulses distally Dorsiflexion/Plantar flexion intact Incision: dressing C/D/I  LABS  Results for orders placed or performed during the hospital encounter of 05/13/23 (from the past 24 hours)  Urinalysis, Routine w reflex microscopic -Urine, Clean Catch     Status: Abnormal   Collection Time: 05/16/23 12:11 AM  Result Value Ref Range   Color, Urine YELLOW YELLOW   APPearance CLEAR CLEAR   Specific Gravity, Urine 1.031 (H) 1.005 - 1.030   pH 5.0 5.0 - 8.0   Glucose, UA NEGATIVE NEGATIVE mg/dL   Hgb urine dipstick NEGATIVE NEGATIVE   Bilirubin Urine SMALL (A) NEGATIVE   Ketones, ur NEGATIVE NEGATIVE mg/dL   Protein, ur NEGATIVE NEGATIVE mg/dL   Nitrite NEGATIVE NEGATIVE   Leukocytes,Ua TRACE (A) NEGATIVE   RBC / HPF 0-5 0 - 5 RBC/hpf   WBC, UA 0-5 0 - 5 WBC/hpf   Bacteria, UA NONE SEEN NONE SEEN   Squamous Epithelial / HPF 6-10 0 - 5 /HPF   Mucus PRESENT   CBC     Status: Abnormal   Collection Time: 05/16/23  3:32 AM  Result Value Ref Range   WBC 16.5 (H) 4.0 - 10.5 K/uL   RBC 4.53 3.87 - 5.11 MIL/uL   Hemoglobin 13.1 12.0 - 15.0 g/dL   HCT 62.1 30.8 - 65.7 %    MCV 94.3 80.0 - 100.0 fL   MCH 28.9 26.0 - 34.0 pg   MCHC 30.7 30.0 - 36.0 g/dL   RDW 84.6 96.2 - 95.2 %   Platelets 320 150 - 400 K/uL   nRBC 0.0 0.0 - 0.2 %  Basic metabolic panel     Status: Abnormal   Collection Time: 05/16/23  3:32 AM  Result Value Ref Range   Sodium 131 (L) 135 - 145 mmol/L   Potassium 4.5 3.5 - 5.1 mmol/L   Chloride 94 (L) 98 - 111 mmol/L   CO2 25 22 - 32 mmol/L   Glucose, Bld 97 70 - 99 mg/dL   BUN 20 8 - 23 mg/dL   Creatinine, Ser 8.41 0.44 - 1.00 mg/dL   Calcium 8.5 (L) 8.9 - 10.3 mg/dL   GFR, Estimated >32 >44 mL/min   Anion gap 12 5 - 15    No results found.   Assessment/Plan: Principal Problem:   S/P total knee arthroplasty, left Active Problems:   S/P TKR (total knee replacement), left  3 Days Post-Op s/p Procedure(s): ARTHROPLASTY, KNEE, TOTAL - stable post-op imaging - up with PT, continue incentive spirometry - originally had some left hip  pain, better today, will get hip x-rays to check previous hip replacement  Weightbearing: WBAT LLE Insicional and dressing care: Reinforce dressings as needed VTE prophylaxis: Aspirin 325mg  BID x 30 days Pain control:  - so far having a difficult time with adequate pain control without patient feeling too sedated - yesterday afternoon I added on celebrex and nightly gabapentin and patient feels better today - I have removed the schedule oxy 5's with prn oxy 5's, now back to prn oxy 5-10 mg q 4 hrs with hope to decrease about of narcotic usage - continue schedule tylenol Follow - up plan: 2 weeks with Dr. Dion Saucier  Dispo: not making progress with PT, difficult for her to manage her pad changes/needing to get up to the bathroom often for urinary incontinence. TOC on board for SNF placement, looking into this today. Pain meds are printed in chart.   Contact information:   Janine Ores, Cordelia Poche [id number]  After hours and holidays please check [email or web address] for group call information for Sports Med  Group  Armida Sans 05/16/2023, 11:12 AM

## 2023-05-16 NOTE — TOC Progression Note (Addendum)
 Transition of Care [facility]) - Progression Note    Patient Details  Name: Shelly Hickman MRN: [id number] Date of Birth: [birth year]  [facility] CM/SW Contact  Howell Rucks, RN Phone Number: 05/16/2023, 8:45 AM  Clinical Narrative:   Teams chat received from Ortho PA  regarding proceeding with  SNF placement consult as patient is an Ortho Bundle " confirmed with Dr. Dion Saucier that's ok, we're really struggling with mobility for her".    -10:24am Met with pt at bedside, agreeable to short term rehab/SNF, prefers [facility] or [facility], faxed out for bed offers.   -1:04pm, [facility] made bed offer, informed pt, accepted bed offer. Call to [facility], sw Tammy, short term rehab/SNF and PTAR auth intiiated, await determination.   -2:47pm Call received from [facility] with [employer], short term rehab/SNF auth approved, auth# [id number], pt has 5 business days to transfer to SNF, approved for 7 days once admitted. PTAR request pending-sent for medical review, team notified.       Barriers to Discharge: No Barriers Identified  Expected Discharge Plan and Services         Expected Discharge Date: 05/13/23               DME Arranged: CPM DME Agency: [employer]                   Social Determinants of Health (SDOH) Interventions SDOH Screenings   Food Insecurity: No Food Insecurity (05/13/2023)  Housing: Low Risk  (05/13/2023)  Transportation Needs: No Transportation Needs (05/13/2023)  Utilities: Not At Risk (05/13/2023)  Alcohol Screen: Low Risk  (05/31/2022)  Depression (PHQ2-9): Low Risk  (04/14/2023)  Financial Resource Strain: Medium Risk (05/31/2022)  Physical Activity: Inactive (05/31/2022)  Social Connections: Moderately Isolated (05/13/2023)  Stress: No Stress Concern Present (05/31/2022)  Tobacco Use: Medium Risk (05/13/2023)    Readmission Risk Interventions     No data to display

## 2023-05-16 NOTE — Progress Notes (Signed)
 Physical Therapy Treatment Patient Details Name: Shelly Hickman MRN: [id number] DOB: 11/04/1940 Today's Date: 05/16/2023   History of Present Illness 83 yo female S/P L TKA. PMH: RTKA, L THA,, urinary incontinence, Neuromuscular disorder, HTN, anemia, back surgeries.    PT Comments  POD # 3 am session withheld as Pt was downstairs for a Hip X ray POD # 3 pm session assisted OOB to amb to bathroom.  General bed mobility comments: attempted to demonstarte and educate on use of belt to self assist LE however pt struggled to perform due to pain/effort/weakness/fatigue. General transfer comment: heavy use of UB on RW to off load L knee due to pain.  Also assisted with a toilet transfer.  Required increased time. General Gait Details: very limited amb distance to and from bathroom 11 feet x 2 due to increased pain level and max c/o fatigue.Then returned to room to perform some TE's following HEP handout.  Instructed on proper tech, freq as well as use of ICE.   Pt will need ST Rehab at SNF to address mobility and functional decline prior to safely returning home.     If plan is discharge home, recommend the following: A little help with walking and/or transfers;Assist for transportation;Help with stairs or ramp for entrance;A lot of help with bathing/dressing/bathroom;Assistance with cooking/housework   Can travel by private vehicle     No  Equipment Recommendations  None recommended by PT    Recommendations for Other Services       Precautions / Restrictions Precautions Precautions: Fall;Knee Precaution/Restrictions Comments: no pillow under knee Restrictions Weight Bearing Restrictions Per Provider Order: No LLE Weight Bearing Per Provider Order: Weight bearing as tolerated Other Position/Activity Restrictions: did not use KI this session     Mobility  Bed Mobility   Bed Mobility: Supine to Sit           General bed mobility comments: attempted to demonstarte and educate on  use of belt to self assist LE however pt struggled to perform due to pain/effort/weakness/fatigue.    Transfers Overall transfer level: Needs assistance Equipment used: Rolling walker (2 wheels) Transfers: Sit to/from Stand Sit to Stand: Min assist, Mod assist           General transfer comment: heavy use of UB on RW to off load L knee due to pain.  Also assisted with a toilet transfer.  Required increased time.    Ambulation/Gait Ambulation/Gait assistance: Min assist Gait Distance (Feet): 22 Feet (11 feet x 2) Assistive device: Rolling walker (2 wheels) Gait Pattern/deviations: Step-to pattern, Antalgic Gait velocity: decr     General Gait Details: very limited amb distance to and from bathroom due to increased pain level and max c/o fatigue.   Stairs             Wheelchair Mobility     Tilt Bed    Modified Rankin (Stroke Patients Only)       Balance                                            Communication Communication Communication: Impaired Factors Affecting Communication: Hearing impaired  Cognition Arousal: Alert Behavior During Therapy: WFL for tasks assessed/performed   PT - Cognitive impairments: No apparent impairments  PT - Cognition Comments: AxO x 3 pleasant and willing.  Required increased time and positive support. Following commands: Intact      Cueing Cueing Techniques: Verbal cues  Exercises  Total Knee Replacement TE's following HEP handout 10 reps B LE ankle pumps 05 reps towel squeezes 05 reps knee presses 05 reps heel slides AAROM  Educated on use of gait belt to assist with TE's Followed by ICE     General Comments        Pertinent Vitals/Pain Pain Assessment Pain Assessment: 0-10 Pain Score: 7  Pain Location: L knee Pain Descriptors / Indicators: Grimacing, Discomfort, Guarding, Crying Pain Intervention(s): Monitored during session, Premedicated before session,  Repositioned, Ice applied    Home Living                          Prior Function            PT Goals (current goals can now be found in the care plan section) Progress towards PT goals: Progressing toward goals    Frequency    7X/week      PT Plan      Co-evaluation              AM-PAC PT "6 Clicks" Mobility   Outcome Measure  Help needed turning from your back to your side while in a flat bed without using bedrails?: A Lot Help needed moving from lying on your back to sitting on the side of a flat bed without using bedrails?: A Lot Help needed moving to and from a bed to a chair (including a wheelchair)?: A Lot Help needed standing up from a chair using your arms (e.g., wheelchair or bedside chair)?: A Lot Help needed to walk in hospital room?: A Lot Help needed climbing 3-5 steps with a railing? : Total 6 Click Score: 11    End of Session Equipment Utilized During Treatment: Gait belt Activity Tolerance: Patient limited by pain;Patient limited by fatigue Patient left: in chair;with call bell/phone within reach;with family/visitor present Nurse Communication: Mobility status PT Visit Diagnosis: Unsteadiness on feet (R26.81);Other abnormalities of gait and mobility (R26.89);Pain;Muscle weakness (generalized) (M62.81) Pain - Right/Left: Left Pain - part of body: Knee     Time: 1610-9604 PT Time Calculation (min) (ACUTE ONLY): 28 min  Charges:    $Gait Training: 8-22 mins $Therapeutic Exercise: 8-22 mins PT General Charges $$ ACUTE PT VISIT: 1 Visit                     Felecia Shelling  PTA Acute  Rehabilitation Services Office M-F          [phone]

## 2023-05-16 NOTE — Evaluation (Signed)
 Occupational Therapy Evaluation Patient Details Name: Shelly Hickman MRN: [id number] DOB: [birth year] Today's Date: 05/16/2023   History of Present Illness   83 yo female S/P L TKA. PMH: RTKA, L THA,, urinary incontinence, Neuromuscular disorder, HTN, anemia, back surgeries.     Clinical Impressions  PTA, patient was living alone and was independent with family living out of state. Patient now presents to OT this am with post op L TKA with pain, decreased balance, activity tolerance and L LE edema impacting BADL's and functional transfers and mobility. Patient with need for 1 ltr O2 via [place] this am, issues with urinary incontinence and mild decreased processing due to pain distraction and s/s of anxiety. Patient requires continued skilled OT services on Acute setting to progress function. Patient will benefit from continued inpatient follow up therapy, <3 hours/day based on current functional status.       If plan is discharge home, recommend the following:   A lot of help with walking and/or transfers;A lot of help with bathing/dressing/bathroom;Assistance with cooking/housework;Assistance with feeding;Direct supervision/assist for medications management;Direct supervision/assist for financial management;Assist for transportation;Help with stairs or ramp for entrance;Supervision due to cognitive status     Functional Status Assessment   Patient has had a recent decline in their functional status and demonstrates the ability to make significant improvements in function in a reasonable and predictable amount of time.     Equipment Recommendations   None recommended by OT      Precautions/Restrictions   Precautions Precautions: Fall;Knee Required Braces or Orthoses: Knee Immobilizer - Left Knee Immobilizer - Left:  (KI as needed) Restrictions Weight Bearing Restrictions Per Provider Order: Yes LLE Weight Bearing Per Provider Order: Weight bearing as tolerated Other  Position/Activity Restrictions: Placed KI  on LLE as patient reporting significant pain.     Mobility Bed Mobility Overal bed mobility: Needs Assistance Bed Mobility: Supine to Sit, Sit to Supine     Supine to sit: Mod assist Sit to supine: Min assist   General bed mobility comments: increased time    Transfers Overall transfer level: Needs assistance Equipment used: Rolling walker (2 wheels) Transfers: Sit to/from Stand, Bed to chair/wheelchair/BSC Sit to Stand: Min assist, Mod assist     Step pivot transfers: Mod assist     General transfer comment: heavy use of UB on RW to off load L knee due to pain      Balance Overall balance assessment: Needs assistance   Sitting balance-Leahy Scale: Good     Standing balance support: Bilateral upper extremity supported, During functional activity, Reliant on assistive device for balance Standing balance-Leahy Scale: Fair Standing balance comment: has to balance self to pull down briefs and  soiled pad and then pull up the   briefs and place a pad, required mod support for  balance when not supported on RW                           ADL either performed or assessed with clinical judgement   ADL Overall ADL's : Needs assistance/impaired Eating/Feeding: Set up   Grooming: Wash/dry hands;Wash/dry face;Oral care;Brushing hair;Set up;Sitting   Upper Body Bathing: Contact guard assist;Sitting   Lower Body Bathing: Total assistance;Sitting/lateral leans;Sit to/from stand;Cueing for compensatory techniques;Maximal assistance   Upper Body Dressing : Contact guard assist;Sitting   Lower Body Dressing: Maximal assistance;Cueing for compensatory techniques;Sitting/lateral leans;Sit to/from stand;Total assistance   Toilet Transfer: Rolling walker (2 wheels);BSC/3in1;Stand-pivot;Moderate assistance   Toileting- Clothing  Manipulation and Hygiene: Maximal assistance;Sitting/lateral lean;Sit to/from stand       Functional  mobility during ADLs: Minimal assistance;Rolling walker (2 wheels) General ADL Comments: difficulty with hygiene and LB functional reach, incontinent of urine needing Depends and pad     Vision Baseline Vision/History: 1 Wears glasses Ability to See in Adequate Light: 0 Adequate Patient Visual Report: No change from baseline Vision Assessment?: No apparent visual deficits     Perception Perception: Within Functional Limits       Praxis Praxis: WFL       Pertinent Vitals/Pain Pain Assessment Pain Assessment: 0-10 Pain Score: 7  Pain Location: medial left knee below patella when WB, and right wrist with WB Pain Descriptors / Indicators: Grimacing, Discomfort, Guarding, Crying Pain Intervention(s): Monitored during session, Ice applied, Utilized relaxation techniques, Repositioned, Patient requesting pain meds-RN notified     Extremity/Trunk Assessment Upper Extremity Assessment Upper Extremity Assessment: Overall WFL for tasks assessed   Lower Extremity Assessment Lower Extremity Assessment: Defer to PT evaluation   Cervical / Trunk Assessment Cervical / Trunk Assessment: Normal   Communication Communication Communication: Impaired Factors Affecting Communication: Hearing impaired   Cognition Arousal: Alert Behavior During Therapy: Anxious, Restless, WFL for tasks assessed/performed Cognition: No apparent impairments (mild processing dificulty for higher level activity due to pain)                               Following commands: Intact       Cueing  General Comments   Cueing Techniques: Verbal cues  L TKA site with dressing intact, +1-2 L LE edema, on 1 ltr O2 with 98%   Exercises     Shoulder Instructions      Home Living Family/patient expects to be discharged to:: Private residence Living Arrangements: Alone Available Help at Discharge: Family;Available 24 hours/day Type of Home: House Home Access: Ramped entrance     Home Layout:  One level     Bathroom Shower/Tub: Producer, television/film/video: Standard Bathroom Accessibility: Yes How Accessible: Accessible via walker Home Equipment: Rolling Walker (2 wheels);Cane - single point;BSC/3in1;Toilet riser;Adaptive equipment Adaptive Equipment: Sock aid;Reacher        Prior Functioning/Environment Prior Level of Function : Independent/Modified Independent                    OT Problem List: Decreased activity tolerance;Impaired balance (sitting and/or standing);Decreased safety awareness;Decreased knowledge of use of DME or AE;Decreased knowledge of precautions;Cardiopulmonary status limiting activity;Pain;Increased edema   OT Treatment/Interventions: Self-care/ADL training;Therapeutic exercise;Energy conservation;DME and/or AE instruction;Modalities;Therapeutic activities;Cognitive remediation/compensation;Patient/family education;Balance training      OT Goals(Current goals can be found in the care plan section)   Acute Rehab OT Goals Patient Stated Goal: to feel better OT Goal Formulation: With patient Time For Goal Achievement: 05/30/23 Potential to Achieve Goals: Good ADL Goals Pt Will Perform Lower Body Bathing: with min assist;sitting/lateral leans;sit to/from stand;with adaptive equipment Pt Will Perform Lower Body Dressing: with min assist;sitting/lateral leans;sit to/from stand;with adaptive equipment Pt Will Transfer to Toilet: with contact guard assist;ambulating;bedside commode;grab bars Pt Will Perform Toileting - Clothing Manipulation and hygiene: with min assist;sitting/lateral leans;sit to/from stand   OT Frequency:  Min 2X/week       AM-PAC OT "6 Clicks" Daily Activity     Outcome Measure Help from another person eating meals?: A Little Help from another person taking care of personal grooming?: A Little Help from another person  toileting, which includes using toliet, bedpan, or urinal?: A Lot Help from another person  bathing (including washing, rinsing, drying)?: Total Help from another person to put on and taking off regular upper body clothing?: A Little Help from another person to put on and taking off regular lower body clothing?: Total 6 Click Score: 13   End of Session Equipment Utilized During Treatment: Gait belt;Rolling walker (2 wheels);Oxygen Nurse Communication: Mobility status;Precautions (vioding entered in Flowsheets)  Activity Tolerance: Patient limited by fatigue;Patient limited by pain Patient left: in chair;with call bell/phone within reach;with chair alarm set;with nursing/sitter in room  OT Visit Diagnosis: Unsteadiness on feet (R26.81);Pain Pain - Right/Left: Left Pain - part of body: Knee                Time: 0820-0850 OT Time Calculation (min): 30 min Charges:  OT General Charges $OT Visit: 1 Visit OT Evaluation $OT Eval Moderate Complexity: 1 Mod OT Treatments $Self Care/Home Management : 8-22 mins  Anysha Frappier OT/L Acute Rehabilitation Department  [phone]  05/16/2023, 9:58 AM

## 2023-05-17 LAB — CBC
HCT: 38.7 % (ref 36.0–46.0)
Hemoglobin: 12.2 g/dL (ref 12.0–15.0)
MCH: 28.7 pg (ref 26.0–34.0)
MCHC: 31.5 g/dL (ref 30.0–36.0)
MCV: 91.1 fL (ref 80.0–100.0)
Platelets: 283 10*3/uL (ref 150–400)
RBC: 4.25 MIL/uL (ref 3.87–5.11)
RDW: 14 % (ref 11.5–15.5)
WBC: 13.6 10*3/uL — ABNORMAL HIGH (ref 4.0–10.5)
nRBC: 0 % (ref 0.0–0.2)

## 2023-05-17 NOTE — TOC Progression Note (Addendum)
 Transition of Care [facility]) - Progression Note    Patient Details  Name: Shelly Hickman MRN: [id number] Date of Birth: 14-Feb-1941  Transition of [facility] CM/SW Contact  Howell Rucks, RN Phone Number: 05/17/2023, 1:20 PM  Clinical Narrative:   Call from [employer], reports request for PTAR denied by Medical Director. Teams chat sent to Dr Dion Saucier at 9:00am with notification. Reply-Please contact the on-call provider if desired.    -1:21pm NCM met with patient and son at bedside, reviewed approval of SNF, denial of PTAR, son reports he does not feel comfortable transporting patient, requesting assistance in location alternative transportation.   -3:28pm Call received from Debbie with [facility], advised peer to peer for non emergency ambulance expires at 5:00pm today. NCM sent teams chat to Montez Morita, Ortho PA, teams chat response " can wait until surgical team on Monday who knows more about patient".  PT note from 3/29 states patient can travel by private car.    Expected Discharge Plan: Skilled Nursing Facility Barriers to Discharge: Continued Medical Work up  Expected Discharge Plan and Services         Expected Discharge Date: 05/13/23               DME Arranged: CPM DME Agency: [employer]                   Social Determinants of Health (SDOH) Interventions SDOH Screenings   Food Insecurity: No Food Insecurity (05/13/2023)  Housing: Low Risk  (05/13/2023)  Transportation Needs: No Transportation Needs (05/13/2023)  Utilities: Not At Risk (05/13/2023)  Alcohol Screen: Low Risk  (05/31/2022)  Depression (PHQ2-9): Low Risk  (04/14/2023)  Financial Resource Strain: Medium Risk (05/31/2022)  Physical Activity: Inactive (05/31/2022)  Social Connections: Moderately Isolated (05/13/2023)  Stress: No Stress Concern Present (05/31/2022)  Tobacco Use: Medium Risk (05/13/2023)    Readmission Risk Interventions    05/16/2023   10:22 AM  Readmission Risk Prevention Plan   Post Dischage Appt Complete  Medication Screening Complete  Transportation Screening Complete

## 2023-05-17 NOTE — Progress Notes (Signed)
 Orthopaedic Trauma Service Progress Note  Patient ID: Shelly Hickman MRN: [id number] DOB/AGE: 1940/04/24 83 y.o.  Subjective:  Feeling better No specific complaints  Pain improving    ROS As above  Objective:   VITALS:   Vitals:   05/17/23 0605 05/17/23 0905 05/17/23 0907 05/17/23 0956  BP: 128/72  (!) 179/77 (!) 174/71  Pulse: 83  80 82  Resp: 15  16   Temp: 98 F (36.7 C) 98.2 F (36.8 C)    TempSrc: Oral Oral    SpO2: 94%  97%   Weight:      Height:        Estimated body mass index is 36.29 kg/m as calculated from the following:   Height as of this encounter: 5\' 6"  (1.676 m).   Weight as of this encounter: 102 kg.   Intake/Output      03/28 0701 03/29 0700 03/29 0701 03/30 0700   P.O. 480 120   I.V. (mL/kg) 920 (9)    IV Piggyback 0    Total Intake(mL/kg) 1400 (13.7) 120 (1.2)   Urine (mL/kg/hr)     Stool     Total Output     Net +1400 +120        Urine Occurrence 5 x 1 x   Stool Occurrence 0 x 0 x   Emesis Occurrence 0 x      LABS  Results for orders placed or performed during the hospital encounter of 05/13/23 (from the past 24 hours)  CBC     Status: Abnormal   Collection Time: 05/17/23  3:39 AM  Result Value Ref Range   WBC 13.6 (H) 4.0 - 10.5 K/uL   RBC 4.25 3.87 - 5.11 MIL/uL   Hemoglobin 12.2 12.0 - 15.0 g/dL   HCT 18.8 41.6 - 60.6 %   MCV 91.1 80.0 - 100.0 fL   MCH 28.7 26.0 - 34.0 pg   MCHC 31.5 30.0 - 36.0 g/dL   RDW 30.1 60.1 - 09.3 %   Platelets 283 150 - 400 K/uL   nRBC 0.0 0.0 - 0.2 %     PHYSICAL EXAM:   Gen: pleasant, sitting in bed, NAD Lungs: unlabored, no increased WOB Cardiac: reg Ext:       Left Lower Extremity Dressing is clean, dry and intact  Extremity is warm  No DCT  Compartments are soft  No pain out of proportion with passive stretching of his toes or ankle  DPN, SPN, TN sensory functions are intact  EHL, FHL, lesser toe  motor functions intact  Ankle flexion, extension, inversion eversion intact  + DP pulse   Assessment/Plan: 4 Days Post-Op   Principal Problem:   S/P total knee arthroplasty, left Active Problems:   S/P TKR (total knee replacement), left   Anti-infectives (From admission, onward)    Start     Dose/Rate Route Frequency Ordered Stop   05/13/23 0900  gentamicin (GARAMYCIN) 380 mg in dextrose 5 % 100 mL IVPB        5 mg/kg  76.4 kg (Adjusted) 109.5 mL/hr over 60 Minutes Intravenous On call to O.R. 05/13/23 2355 05/14/23 0756   05/13/23 0900  vancomycin (VANCOREADY) IVPB 1500 mg/300 mL        1,500 mg 150 mL/hr over 120 Minutes Intravenous On call to O.R.  05/13/23 1610 05/14/23 0758     .  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 [place] [address] [phone] [phone] (F)    After 5pm and on the weekends please log on to Amion, go to orthopaedics and the look under the [facility] Call for the provider(s) on call. You can also call our office at [phone] and then follow the prompts to be connected to the call team.  Patient ID: Shelly Hickman, female   DOB: 07-05-1940, 83 y.o.   MRN: [id number]

## 2023-05-17 NOTE — Progress Notes (Addendum)
 Physical Therapy Treatment Patient Details Name: KATHEY SIMER MRN: [id number] DOB: 19-May-1940 Today's Date: 05/17/2023   History of Present Illness 83 yo female S/P L TKA. PMH: RTKA, L THA,, urinary incontinence, Neuromuscular disorder, HTN, anemia, back surgeries.    PT Comments  Pt continues to make steady progress; overall supervision to CGA for mobility. Too fatigued to amb agaion this pm after transfers and TKA exercises  pt considering home vs SNF, son is here for one more week to assist as needed. Discussed car transfers. Will continue to follow in acute setting    If plan is discharge home, recommend the following: A little help with walking and/or transfers;Assist for transportation;Help with stairs or ramp for entrance;A lot of help with bathing/dressing/bathroom;Assistance with cooking/housework   Can travel by private vehicle     Yes  Equipment Recommendations  None recommended by PT    Recommendations for Other Services       Precautions / Restrictions Precautions Precautions: Fall;Knee Precaution/Restrictions Comments: no pillow under knee Restrictions Weight Bearing Restrictions Per Provider Order: No LLE Weight Bearing Per Provider Order: Weight bearing as tolerated Other Position/Activity Restrictions: did not use KI this session     Mobility  Bed Mobility Overal bed mobility: Needs Assistance Bed Mobility: Supine to Sit     Supine to sit: HOB elevated, Supervision     General bed mobility comments: incr time and effort, pt able to  self assist with gait belt for LLE.    Transfers Overall transfer level: Needs assistance Equipment used: Rolling walker (2 wheels) Transfers: Sit to/from Stand, Bed to chair/wheelchair/BSC Sit to Stand: From elevated surface, Contact guard assist, Supervision   Step pivot transfers: Supervision       General transfer comment: cues for hand placement, LLE position and to control descent; SPT bed to [facility], BSC to  chair    Ambulation/Gait  Stairs             Wheelchair Mobility     Tilt Bed    Modified Rankin (Stroke Patients Only)       Balance     Sitting balance-Leahy Scale: Good     Standing balance support: Bilateral upper extremity supported, During functional activity, Reliant on assistive device for balance Standing balance-Leahy Scale: Poor                              Communication Communication Communication: Impaired Factors Affecting Communication: Hearing impaired  Cognition Arousal: Alert Behavior During Therapy: WFL for tasks assessed/performed   PT - Cognitive impairments: No apparent impairments                         Following commands: Intact      Cueing Cueing Techniques: Verbal cues  Exercises Total Joint Exercises Ankle Circles/Pumps: AROM, Both, 10 reps Quad Sets: AROM, Left, 5 reps Heel Slides: AAROM, Left, 10 reps Hip ABduction/ADduction: AAROM, Left, 10 reps Straight Leg Raises: AAROM, Left, 10 reps Goniometric ROM: grossly 6 to 80 degrees L knee fleixon    General Comments        Pertinent Vitals/Pain Pain Assessment Pain Assessment: 0-10 Pain Score: 3  Pain Location: L knee Pain Descriptors / Indicators: Grimacing, Discomfort, Guarding, Crying Pain Intervention(s): Limited activity within patient's tolerance, Monitored during session, Premedicated before session, Repositioned    Home Living  Prior Function            PT Goals (current goals can now be found in the care plan section) Acute Rehab PT Goals Patient Stated Goal: go home PT Goal Formulation: With patient Time For Goal Achievement: 05/21/23 Potential to Achieve Goals: Good Progress towards PT goals: Progressing toward goals    Frequency    7X/week      PT Plan      Co-evaluation              AM-PAC PT "6 Clicks" Mobility   Outcome Measure  Help needed turning from your back to  your side while in a flat bed without using bedrails?: A Little Help needed moving from lying on your back to sitting on the side of a flat bed without using bedrails?: A Little Help needed moving to and from a bed to a chair (including a wheelchair)?: A Little Help needed standing up from a chair using your arms (e.g., wheelchair or bedside chair)?: A Lot Help needed to walk in hospital room?: A Little Help needed climbing 3-5 steps with a railing? : A Lot 6 Click Score: 16    End of Session Equipment Utilized During Treatment: Gait belt Activity Tolerance: Patient tolerated treatment well Patient left: in chair;with call bell/phone within reach   PT Visit Diagnosis: Unsteadiness on feet (R26.81);Other abnormalities of gait and mobility (R26.89);Pain;Muscle weakness (generalized) (M62.81) Pain - Right/Left: Left Pain - part of body: Knee     Time: 5284-1324 PT Time Calculation (min) (ACUTE ONLY): 25 min  Charges:    $Therapeutic Exercise: 8-22 mins $Therapeutic Activity: 8-22 mins PT General Charges $$ ACUTE PT VISIT: 1 Visit                     Spurgeon Gancarz, PT  Acute Rehab Dept [facility]) [phone]  05/17/2023    [facility] 05/17/2023, 3:39 PM

## 2023-05-17 NOTE — Plan of Care (Signed)

## 2023-05-17 NOTE — Progress Notes (Signed)
 Physical Therapy Treatment Patient Details Name: Shelly Hickman MRN: [id number] DOB: [birth year] Today's Date: 05/17/2023   History of Present Illness 83 yo female S/P L TKA. PMH: RTKA, L THA,, urinary incontinence, Neuromuscular disorder, HTN, anemia, back surgeries.    PT Comments  Pt is progressing this session; tol incr gait distance, wt shift to LLE improved; d/c plan remains appropriate at this time. Continue PT POC    If plan is discharge home, recommend the following: A little help with walking and/or transfers;Assist for transportation;Help with stairs or ramp for entrance;A lot of help with bathing/dressing/bathroom;Assistance with cooking/housework   Can travel by private vehicle     Yes  Equipment Recommendations  None recommended by PT    Recommendations for Other Services       Precautions / Restrictions Precautions Precautions: Fall;Knee Precaution/Restrictions Comments: no pillow under knee Restrictions Weight Bearing Restrictions Per Provider Order: No LLE Weight Bearing Per Provider Order: Weight bearing as tolerated Other Position/Activity Restrictions: did not use KI this session     Mobility  Bed Mobility Overal bed mobility: Needs Assistance Bed Mobility: Supine to Sit     Supine to sit: Min assist, HOB elevated     General bed mobility comments: incr time and effort, pt able to initiate self assist with gait belt for LLE. min assist to complete transition    Transfers Overall transfer level: Needs assistance Equipment used: Rolling walker (2 wheels) Transfers: Sit to/from Stand Sit to Stand: Min assist, From elevated surface, Contact guard assist           General transfer comment: cues for hand placement, LLE position and to control descent    Ambulation/Gait Ambulation/Gait assistance: Contact guard assist Gait Distance (Feet): 34 Feet Assistive device: Rolling walker (2 wheels) Gait Pattern/deviations: Step-to pattern Gait  velocity: decr     General Gait Details: wt shift to LLE improved this session; cues for trunk extension; 2 brief standing rests   Stairs             Wheelchair Mobility     Tilt Bed    Modified Rankin (Stroke Patients Only)       Balance     Sitting balance-Leahy Scale: Good     Standing balance support: Bilateral upper extremity supported, During functional activity, Reliant on assistive device for balance Standing balance-Leahy Scale: Poor                              Communication Communication Communication: Impaired Factors Affecting Communication: Hearing impaired  Cognition Arousal: Alert Behavior During Therapy: WFL for tasks assessed/performed   PT - Cognitive impairments: No apparent impairments                         Following commands: Intact      Cueing Cueing Techniques: Verbal cues  Exercises Total Joint Exercises Ankle Circles/Pumps: AROM, Both, 10 reps Quad Sets: AROM, Left, 5 reps    General Comments        Pertinent Vitals/Pain Pain Assessment Pain Assessment: 0-10 Pain Score: 4  Pain Location: L knee Pain Descriptors / Indicators: Grimacing, Discomfort, Guarding, Crying Pain Intervention(s): Limited activity within patient's tolerance, Monitored during session, Premedicated before session, Ice applied    Home Living                          Prior Function  PT Goals (current goals can now be found in the care plan section) Acute Rehab PT Goals Patient Stated Goal: go home PT Goal Formulation: With patient Time For Goal Achievement: 05/21/23 Potential to Achieve Goals: Good Progress towards PT goals: Progressing toward goals    Frequency    7X/week      PT Plan      Co-evaluation              AM-PAC PT "6 Clicks" Mobility   Outcome Measure  Help needed turning from your back to your side while in a flat bed without using bedrails?: A Little Help needed  moving from lying on your back to sitting on the side of a flat bed without using bedrails?: A Little Help needed moving to and from a bed to a chair (including a wheelchair)?: A Little Help needed standing up from a chair using your arms (e.g., wheelchair or bedside chair)?: A Lot Help needed to walk in hospital room?: A Little Help needed climbing 3-5 steps with a railing? : A Lot 6 Click Score: 16    End of Session Equipment Utilized During Treatment: Gait belt Activity Tolerance: Patient tolerated treatment well Patient left: in chair;with call bell/phone within reach   PT Visit Diagnosis: Unsteadiness on feet (R26.81);Other abnormalities of gait and mobility (R26.89);Pain;Muscle weakness (generalized) (M62.81) Pain - Right/Left: Left Pain - part of body: Knee     Time: 1011-1029 PT Time Calculation (min) (ACUTE ONLY): 18 min  Charges:    $Gait Training: 8-22 mins PT General Charges $$ ACUTE PT VISIT: 1 Visit                     Tierre Netto, PT  Acute Rehab Dept [facility]) [phone]  05/17/2023    [facility] 05/17/2023, 10:38 AM

## 2023-05-18 MED ORDER — HYDRALAZINE HCL 20 MG/ML IJ SOLN: 10.0000 mg | Freq: Three times a day (TID) | INTRAMUSCULAR | Status: DC | PRN

## 2023-05-18 NOTE — Plan of Care (Signed)
  Problem: Nutrition: Goal: Adequate nutrition will be maintained Outcome: Progressing   Problem: Coping: Goal: Level of anxiety will decrease Outcome: Progressing   Problem: Elimination: Goal: Will not experience complications related to bowel motility Outcome: Progressing Goal: Will not experience complications related to urinary retention Outcome: Progressing   Problem: Safety: Goal: Ability to remain free from injury will improve Outcome: Progressing   Problem: Skin Integrity: Goal: Risk for impaired skin integrity will decrease Outcome: Progressing   Problem: Education: Goal: Individualized Educational Video(s) Outcome: Progressing   Problem: Activity: Goal: Ability to avoid complications of mobility impairment will improve Outcome: Progressing Goal: Range of joint motion will improve Outcome: Progressing   Problem: Clinical Measurements: Goal: Postoperative complications will be avoided or minimized Outcome: Progressing   Problem: Pain Management: Goal: Pain level will decrease with appropriate interventions Outcome: Progressing   Problem: Skin Integrity: Goal: Will show signs of wound healing Outcome: Progressing

## 2023-05-18 NOTE — Plan of Care (Signed)

## 2023-05-18 NOTE — Progress Notes (Signed)
 PT TX NOTE  05/18/23 1400  PT Visit Information  Last PT Received On 05/18/23  Assistance Needed Continued steady progress. Exercised focused session as pt just back to bed with nursing staff, feeling a bit fatigued today.  D/c plan remains appropriate, plan is for SNF [facility]) tomorrow.  History of Present Illness 83 yo female S/P L TKA. PMH: RTKA, L THA,, urinary incontinence, Neuromuscular disorder, HTN, anemia, back surgeries.  Subjective Data  Patient Stated Goal go home  Precautions  Precautions Fall;Knee  Precaution/Restrictions Comments no pillow under knee  Restrictions  Weight Bearing Restrictions Per Provider Order No  LLE Weight Bearing Per Provider Order WBAT  Other Position/Activity Restrictions did not use KI this session  Pain Assessment  Pain Assessment 0-10  Pain Score 4  Pain Location L knee wtih activity  Pain Descriptors / Indicators Grimacing;Discomfort;Guarding;Crying  Pain Intervention(s) Limited activity within patient's tolerance;Monitored during session;Premedicated before session  Cognition  Arousal Alert  Behavior During Therapy WFL for tasks assessed/performed  PT - Cognitive impairments No apparent impairments  Following Commands  Following commands Intact  Cueing  Cueing Techniques Verbal cues  Communication  Communication Impaired  Factors Affecting Communication Hearing impaired  Balance  Sitting balance-Leahy Scale Good  Standing balance support Bilateral upper extremity supported;During functional activity;Reliant on assistive device for balance  Standing balance-Leahy Scale Poor  Total Joint Exercises  Ankle Circles/Pumps AROM;Both;10 reps  Quad Sets AROM;Left;10 reps  Heel Slides AAROM;Left;10 reps  Hip ABduction/ADduction AAROM;Left;10 reps  Straight Leg Raises AAROM;Left;15 reps  Goniometric ROM grossly 8 to 75 degrees AAROM R knee flexion  PT - End of Session  Equipment Utilized During Treatment Gait belt  Activity Tolerance  Patient tolerated treatment well  Patient left with call bell/phone within reach;in bed;with family/visitor present   PT - Assessment/Plan  PT Visit Diagnosis Unsteadiness on feet (R26.81);Other abnormalities of gait and mobility (R26.89);Pain;Muscle weakness (generalized) (M62.81)  Pain - Right/Left Left  Pain - part of body Knee  PT Frequency (ACUTE ONLY) 7X/week  Follow Up Recommendations Skilled nursing-short term rehab (<3 hours/day)  Can patient physically be transported by private vehicle Yes  Patient can return home with the following A little help with walking and/or transfers;Assist for transportation;Help with stairs or ramp for entrance;A lot of help with bathing/dressing/bathroom;Assistance with cooking/housework  PT equipment None recommended by PT  AM-PAC PT "6 Clicks" Mobility Outcome Measure (Version 2)  Help needed turning from your back to your side while in a flat bed without using bedrails? 3  Help needed moving from lying on your back to sitting on the side of a flat bed without using bedrails? 3  Help needed moving to and from a bed to a chair (including a wheelchair)? 3  Help needed standing up from a chair using your arms (e.g., wheelchair or bedside chair)? 2  Help needed to walk in hospital room? 3  Help needed climbing 3-5 steps with a railing?  2  6 Click Score 16  Consider Recommendation of Discharge To: Home with [facility]  Progressive Mobility  Activity Repositioned in chair  PT Goal Progression  Progress towards PT goals Progressing toward goals  Acute Rehab PT Goals  PT Goal Formulation With patient  Time For Goal Achievement 05/21/23  Potential to Achieve Goals Good  PT Time Calculation  PT Start Time (ACUTE ONLY) 1405  PT Stop Time (ACUTE ONLY) 1420  PT Time Calculation (min) (ACUTE ONLY) 15 min  PT General Charges  $$ ACUTE PT VISIT  1 Visit  PT Treatments  $Therapeutic Exercise 8-22 mins

## 2023-05-18 NOTE — Progress Notes (Signed)
 Physical Therapy Treatment Patient Details Name: Shelly Hickman MRN: [id number] DOB: 10/13/1940 Today's Date: 05/18/2023   History of Present Illness 83 yo female S/P L TKA. PMH: RTKA, L THA,, urinary incontinence, Neuromuscular disorder, HTN, anemia, back surgeries.    PT Comments  Pt agreeable but reports fatigue today. Amb 30' with RW, chair follow with pt needing seated rest after distance. Pt is overall at supervision level for mobility. Plan is for SNF on Monday. Continue PT POC   If plan is discharge home, recommend the following: A little help with walking and/or transfers;Assist for transportation;Help with stairs or ramp for entrance;A lot of help with bathing/dressing/bathroom;Assistance with cooking/housework   Can travel by private vehicle     Yes  Equipment Recommendations  None recommended by PT    Recommendations for Other Services       Precautions / Restrictions Precautions Precautions: Fall;Knee Precaution/Restrictions Comments: no pillow under knee Restrictions Weight Bearing Restrictions Per Provider Order: No LLE Weight Bearing Per Provider Order: Weight bearing as tolerated Other Position/Activity Restrictions: did not use KI this session     Mobility  Bed Mobility                    Transfers Overall transfer level: Needs assistance Equipment used: Rolling walker (2 wheels) Transfers: Sit to/from Stand Sit to Stand: Contact guard assist           General transfer comment: ongoing education for hand placement, LLE position and to control descent    Ambulation/Gait Ambulation/Gait assistance: Contact guard assist Gait Distance (Feet): 30 Feet Assistive device: Rolling walker (2 wheels) Gait Pattern/deviations: Step-to pattern Gait velocity: decr     General Gait Details: wt shift to LLE improved this session; cues for trunk extension; fatigued after 30' needing seated rest   Stairs             Wheelchair Mobility      Tilt Bed    Modified Rankin (Stroke Patients Only)       Balance     Sitting balance-Leahy Scale: Good     Standing balance support: Bilateral upper extremity supported, During functional activity, Reliant on assistive device for balance Standing balance-Leahy Scale: Poor                              Communication Communication Communication: Impaired Factors Affecting Communication: Hearing impaired  Cognition Arousal: Alert Behavior During Therapy: WFL for tasks assessed/performed   PT - Cognitive impairments: No apparent impairments                         Following commands: Intact      Cueing Cueing Techniques: Verbal cues  Exercises      General Comments        Pertinent Vitals/Pain Pain Assessment Pain Assessment: 0-10 Pain Score: 4  Pain Location: L knee Pain Descriptors / Indicators: Grimacing, Discomfort, Guarding, Crying Pain Intervention(s): Limited activity within patient's tolerance, Monitored during session, Premedicated before session, Repositioned, Ice applied    Home Living                          Prior Function            PT Goals (current goals can now be found in the care plan section) Acute Rehab PT Goals Patient Stated Goal: go home PT Goal Formulation: With  patient Time For Goal Achievement: 05/21/23 Potential to Achieve Goals: Good Progress towards PT goals: Progressing toward goals    Frequency    7X/week      PT Plan      Co-evaluation              AM-PAC PT "6 Clicks" Mobility   Outcome Measure  Help needed turning from your back to your side while in a flat bed without using bedrails?: A Little Help needed moving from lying on your back to sitting on the side of a flat bed without using bedrails?: A Little Help needed moving to and from a bed to a chair (including a wheelchair)?: A Little Help needed standing up from a chair using your arms (e.g., wheelchair or  bedside chair)?: A Lot Help needed to walk in hospital room?: A Little Help needed climbing 3-5 steps with a railing? : A Lot 6 Click Score: 16    End of Session Equipment Utilized During Treatment: Gait belt Activity Tolerance: Patient tolerated treatment well Patient left: in chair;with call bell/phone within reach   PT Visit Diagnosis: Unsteadiness on feet (R26.81);Other abnormalities of gait and mobility (R26.89);Pain;Muscle weakness (generalized) (M62.81) Pain - Right/Left: Left Pain - part of body: Knee     Time: 9562-1308 PT Time Calculation (min) (ACUTE ONLY): 12 min  Charges:    $Gait Training: 8-22 mins PT General Charges $$ ACUTE PT VISIT: 1 Visit                     Aracelie Addis, PT  Acute Rehab Dept [facility]) [phone]  05/18/2023    [facility] 05/18/2023, 12:38 PM

## 2023-05-18 NOTE — TOC Progression Note (Signed)
 Transition of Care [facility]) - Progression Note    Patient Details  Name: Shelly Hickman MRN: [id number] Date of Birth: Mar 07, 1940  Transition of Care [facility]) CM/SW Contact  Amada Jupiter, LCSW Phone Number: 05/18/2023, 3:53 PM  Clinical Narrative:     Met with pt and son today to review plan for dc to [facility] tomorrow.  Have confirmed with facility they can accept tomorrow.  Son notes his concerns for transportation are simply timing issues.  He has work Psychologist, educational and could not transport pt to facility until after 2:30pm.  Have alerted admissions coordinator, Marcelino Duster, of this and do not anticipate this to be a problem. Will alert TOC coverage tomorrow as well.  Expected Discharge Plan: Skilled Nursing Facility Barriers to Discharge: Continued Medical Work up  Expected Discharge Plan and Services         Expected Discharge Date: 05/13/23               DME Arranged: [facility] DME Agency: [employer]                   Social Determinants of Health (SDOH) Interventions SDOH Screenings   Food Insecurity: No Food Insecurity (05/13/2023)  Housing: Low Risk  (05/13/2023)  Transportation Needs: No Transportation Needs (05/13/2023)  Utilities: Not At Risk (05/13/2023)  Alcohol Screen: Low Risk  (05/31/2022)  Depression (PHQ2-9): Low Risk  (04/14/2023)  Financial Resource Strain: Medium Risk (05/31/2022)  Physical Activity: Inactive (05/31/2022)  Social Connections: Moderately Isolated (05/13/2023)  Stress: No Stress Concern Present (05/31/2022)  Tobacco Use: Medium Risk (05/13/2023)    Readmission Risk Interventions    05/16/2023   10:22 AM  Readmission Risk Prevention Plan  Post Dischage Appt Complete  Medication Screening Complete  Transportation Screening Complete

## 2023-05-18 NOTE — Progress Notes (Signed)
 Occupational Therapy Treatment Patient Details Name: Shelly Hickman MRN: [id number] DOB: 06-26-1940 Today's Date: 05/18/2023   History of present illness Patient is a 83 yo female S/P L TKA. PMH: RTKA, L THA,, urinary incontinence, Neuromuscular disorder, HTN, anemia, back surgeries.   OT comments  Patient was noted to have made progress towards goals today. Patient is motivated to engage in session even with continued pain impacting participation. Patient will benefit from continued inpatient follow up therapy, <3 hours/day       If plan is discharge home, recommend the following:  A lot of help with walking and/or transfers;A lot of help with bathing/dressing/bathroom;Assistance with cooking/housework;Assistance with feeding;Direct supervision/assist for medications management;Direct supervision/assist for financial management;Assist for transportation;Help with stairs or ramp for entrance;Supervision due to cognitive status   Equipment Recommendations  None recommended by OT       Precautions / Restrictions Precautions Precautions: Fall;Knee Precaution/Restrictions Comments: no pillow under knee Required Braces or Orthoses: Knee Immobilizer - Left Knee Immobilizer - Left: Other (comment) (KI as needed) Restrictions Weight Bearing Restrictions Per Provider Order: No LLE Weight Bearing Per Provider Order: Weight bearing as tolerated Other Position/Activity Restrictions: did not use KI this session       Mobility Bed Mobility Overal bed mobility: Needs Assistance Bed Mobility: Supine to Sit     Supine to sit: HOB elevated, Supervision                 Balance Overall balance assessment: Needs assistance   Sitting balance-Leahy Scale: Good     Standing balance support: Bilateral upper extremity supported, During functional activity, Reliant on assistive device for balance Standing balance-Leahy Scale: Poor         ADL either performed or assessed with  clinical judgement   ADL Overall ADL's : Needs assistance/impaired     Grooming: Sitting;Wash/dry hands;Wash/dry face;Brushing hair Grooming Details (indicate cue type and reason): EOB Upper Body Bathing: Contact guard assist;Sitting Upper Body Bathing Details (indicate cue type and reason): EOB Lower Body Bathing: Moderate assistance;Sitting/lateral leans Lower Body Bathing Details (indicate cue type and reason): EOB Upper Body Dressing : Contact guard assist;Sitting       Toilet Transfer: Rolling walker (2 wheels);BSC/3in1;Stand-pivot;Minimal assistance Toilet Transfer Details (indicate cue type and reason): to transfer to recliner in room with increased time and cues for keeping RW close.                 Communication Communication Communication: Impaired Factors Affecting Communication: Hearing impaired   Cognition Arousal: Alert Behavior During Therapy: WFL for tasks assessed/performed Cognition: No apparent impairments                      Pertinent Vitals/ Pain       Pain Assessment Pain Assessment: Faces Faces Pain Scale: Hurts little more Pain Location: L knee wtih activity Pain Descriptors / Indicators: Grimacing, Discomfort, Guarding Pain Intervention(s): Limited activity within patient's tolerance, Monitored during session, Repositioned         Frequency  Min 2X/week        Progress Toward Goals  OT Goals(current goals can now be found in the care plan section)  Progress towards OT goals: Progressing toward goals     Plan         AM-PAC OT "6 Clicks" Daily Activity     Outcome Measure   Help from another person eating meals?: A Little Help from another person taking care of personal grooming?: A Little Help from  another person toileting, which includes using toliet, bedpan, or urinal?: A Lot Help from another person bathing (including washing, rinsing, drying)?: Total Help from another person to put on and taking off regular upper  body clothing?: A Little Help from another person to put on and taking off regular lower body clothing?: Total 6 Click Score: 13    End of Session Equipment Utilized During Treatment: Gait belt;Rolling walker (2 wheels)  OT Visit Diagnosis: Unsteadiness on feet (R26.81);Pain Pain - Right/Left: Left Pain - part of body: Knee   Activity Tolerance Patient limited by fatigue   Patient Left in chair;with call bell/phone within reach;with chair alarm set   Nurse Communication Mobility status        Time: 1610-9604 OT Time Calculation (min): 19 min  Charges: OT General Charges $OT Visit: 1 Visit OT Treatments $Self Care/Home Management : 8-22 mins  Rosalio Loud, MS Acute Rehabilitation Department Office# [phone]   Selinda Flavin 05/18/2023, 3:39 PM

## 2023-05-18 NOTE — Progress Notes (Addendum)
 Orthopaedic Trauma Service (OTS)  5 Days Post-Op Procedure(s) (LRB): ARTHROPLASTY, KNEE, TOTAL (Left)  Subjective: Patient reports pain as  continuing to improve .  + BM  Expressed concern about elevated systolic pressure although diastolic has been fine.  Objective: Current Vitals Blood pressure (!) 157/56, pulse 89, temperature (!) 97.5 F (36.4 C), resp. rate 17, height 5\' 6"  (1.676 m), weight 102 kg, SpO2 99%. Vital signs in last 24 hours: Temp:  [97.5 F (36.4 C)-98.2 F (36.8 C)] 97.5 F (36.4 C) (03/30 0455) Pulse Rate:  [80-89] 89 (03/30 0643) Resp:  [16-17] 17 (03/30 0455) BP: (157-188)/(56-77) 157/56 (03/30 0643) SpO2:  [95 %-99 %] 99 % (03/30 0643)  Intake/Output from previous day: 03/29 0701 - 03/30 0700 In: 790 [P.O.:790] Out: -   LABS Recent Labs    05/16/23 0332 05/17/23 0339  HGB 13.1 12.2   Recent Labs    05/16/23 0332 05/17/23 0339  WBC 16.5* 13.6*  RBC 4.53 4.25  HCT 42.7 38.7  PLT 320 283   Recent Labs    05/16/23 0332  NA 131*  K 4.5  CL 94*  CO2 25  BUN 20  CREATININE 0.76  GLUCOSE 97  CALCIUM 8.5*   No results for input(s): "LABPT", "INR" in the last 72 hours.   Physical Exam LLE Dressing intact, clean, dry  Edema/ swelling controlled  Sens: DPN, SPN, TN intact  Motor: EHL, FHL, and lessor toe ext and flex all intact grossly  Brisk cap refill, warm to touch  Assessment/Plan: 5 Days Post-Op Procedure(s) (LRB): ARTHROPLASTY, KNEE, TOTAL (Left) 1. PT/OT  2. DVT proph ECASA 3. D/c tom; F/u 8-14 days  Myrene Galas, MD [facility], [facility] [phone]

## 2023-05-19 MED ORDER — BACLOFEN 10 MG PO TABS: 10.0000 mg | ORAL_TABLET | Freq: Three times a day (TID) | ORAL | 0 refills | Status: DC

## 2023-05-19 NOTE — Progress Notes (Signed)
 Attempted to call [facility] for report, no answer.

## 2023-05-19 NOTE — Discharge Summary (Signed)
 Discharge Summary  Patient ID: Shelly Hickman MRN: [id number] DOB/AGE: 1940-11-13 83 y.o.  Admit date: 05/13/2023 Discharge date: 05/19/2023  Admission Diagnoses:  S/P total knee arthroplasty, left  Discharge Diagnoses:  Principal Problem:   S/P total knee arthroplasty, left Active Problems:   S/P TKR (total knee replacement), left   Past Medical History:  Diagnosis Date   Anxiety    Arthritis    Depression    GERD (gastroesophageal reflux disease)    H/O hiatal hernia    Hypercholesterolemia    Hypertension    Hypothyroidism    Incontinence of urine    Osteoarthritis    Pneumonia    hx of    Surgeries: Procedure(s): ARTHROPLASTY, KNEE, TOTAL on 05/13/2023   Consultants (if any):   Discharged Condition: Improved  Hospital Course: Shelly Hickman is an 83 y.o. female who was admitted 05/13/2023 with a diagnosis of S/P total knee arthroplasty, left and went to the operating room on 05/13/2023 and underwent the above named procedures.    She was given perioperative antibiotics:  Anti-infectives (From admission, onward)    Start     Dose/Rate Route Frequency Ordered Stop   05/13/23 0900  gentamicin (GARAMYCIN) 380 mg in dextrose 5 % 100 mL IVPB        5 mg/kg  76.4 kg (Adjusted) 109.5 mL/hr over 60 Minutes Intravenous On call to O.R. 05/13/23 4098 05/14/23 0756   05/13/23 0900  vancomycin (VANCOREADY) IVPB 1500 mg/300 mL        1,500 mg 150 mL/hr over 120 Minutes Intravenous On call to O.R. 05/13/23 1191 05/14/23 0758     .  She was given sequential compression devices, early ambulation, and aspirin for DVT prophylaxis.  She benefited maximally from the hospital stay and there were no complications.    Recent vital signs:  Vitals:   05/18/23 2205 05/19/23 0618  BP: (!) 151/78 (!) 157/60  Pulse: 88 77  Resp: 16 16  Temp: 98.2 F (36.8 C) 98.5 F (36.9 C)  SpO2: 95% 94%    Recent laboratory studies:  Lab Results  Component Value Date   HGB 12.2  05/17/2023   HGB 13.1 05/16/2023   HGB 12.3 05/15/2023   Lab Results  Component Value Date   WBC 13.6 (H) 05/17/2023   PLT 283 05/17/2023   Lab Results  Component Value Date   INR 0.87 02/22/2016   Lab Results  Component Value Date   NA 131 (L) 05/16/2023   K 4.5 05/16/2023   CL 94 (L) 05/16/2023   CO2 25 05/16/2023   BUN 20 05/16/2023   CREATININE 0.76 05/16/2023   GLUCOSE 97 05/16/2023    Discharge Medications:   Allergies as of 05/19/2023       Reactions   Avelox [moxifloxacin Hcl In Nacl] Other (See Comments)   CHEST PAIN OF UNSPECIFIED ORIGIN   Cephalosporins Hives, Itching   Bee Venom    Other Itching, Swelling   UNSPECIFIED INSECT STINGS   Niacin And Related Rash        Medication List     TAKE these medications    acetaminophen 325 MG tablet Commonly known as: TYLENOL Take 2 tablets (650 mg total) by mouth every 6 (six) hours as needed for mild pain (or Fever >/= 101).   amLODipine 5 MG tablet Commonly known as: NORVASC Take 1 tablet (5 mg total) by mouth daily.   aspirin EC 325 MG tablet Take 1 tablet (325 mg total) by mouth  2 (two) times daily. What changed:  medication strength how much to take when to take this   baclofen 10 MG tablet Commonly known as: LIORESAL Take 1 tablet (10 mg total) by mouth 3 (three) times daily. As needed for muscle spasm   CALCIUM 600-D PO Take 1 tablet by mouth daily.   celecoxib 200 MG capsule Commonly known as: CELEBREX Take 200 mg by mouth 2 (two) times daily as needed for moderate pain (pain score 4-6). Daily   diphenhydrAMINE 25 mg capsule Commonly known as: BENADRYL Take 25 mg by mouth every 6 (six) hours as needed for itching or allergies.   DULoxetine 60 MG capsule Commonly known as: CYMBALTA Take 2 capsules (120 mg total) by mouth daily.   estradiol 0.1 MG/GM vaginal cream Commonly known as: ESTRACE Place 1 Applicatorful vaginally See admin instructions. 3 nites per week for a month then  once per week   Gemtesa 75 MG Tabs Generic drug: Vibegron Take 75 mg by mouth daily.   ketoconazole 2 % cream Commonly known as: NIZORAL Apply topically daily. What changed:  how much to take when to take this reasons to take this   levothyroxine 50 MCG tablet Commonly known as: SYNTHROID Take 1 tablet (50 mcg total) by mouth daily before breakfast. What changed: how much to take   losartan 100 MG tablet Commonly known as: COZAAR Take 1 tablet (100 mg total) by mouth daily.   omega-3 acid ethyl esters 1 g capsule Commonly known as: LOVAZA Take 2 capsules (2 g total) by mouth 2 (two) times daily.   ondansetron 4 MG tablet Commonly known as: Zofran Take 1 tablet (4 mg total) by mouth every 8 (eight) hours as needed for nausea or vomiting.   oxyCODONE 5 MG immediate release tablet Commonly known as: Roxicodone Take 1 tablet (5 mg total) by mouth every 4 (four) hours as needed for severe pain (pain score 7-10).   sennosides-docusate sodium 8.6-50 MG tablet Commonly known as: SENOKOT-S Take 2 tablets by mouth daily.   simvastatin 40 MG tablet Commonly known as: ZOCOR Take 1 tablet (40 mg total) by mouth at bedtime. What changed: how much to take   traZODone 100 MG tablet Commonly known as: DESYREL TAKE 1 TABLET BY MOUTH AT BEDTIME FOR SLEEP   Vitamin D-3 125 MCG (5000 UT) Tabs Take 5,000 Units by mouth daily.        Diagnostic Studies: DG HIP UNILAT WITH PELVIS 2-3 VIEWS LEFT Result Date: 05/16/2023 CLINICAL DATA:  Left knee surgery 3 days ago.  Left hip pain. EXAM: DG HIP (WITH OR WITHOUT PELVIS) 2-3V LEFT COMPARISON:  None Available. FINDINGS: There is diffuse decreased bone mineralization. Status post total left hip arthroplasty. No perihardware lucency is seen to indicate hardware failure or loosening. Moderate superomedial right femoroacetabular joint space narrowing. Mild superior right femoral head cortical flattening/remodeling. Mild bilateral sacroiliac  subchondral sclerosis. Mild pubic symphysis subchondral sclerosis and degenerative subchondral erosions. Partial visualization of lower lumbar spine posterior rod and screw fixation hardware. No acute fracture or dislocation. IMPRESSION: 1. Status post total left hip arthroplasty without evidence of hardware failure. 2. Moderate right femoroacetabular osteoarthritis. Electronically Signed   By: Neita Garnet M.D.   On: 05/16/2023 12:13   DG Knee Left Port Result Date: 05/13/2023 CLINICAL DATA:  Status post total left knee arthroplasty. EXAM: PORTABLE LEFT KNEE - 1-2 VIEW COMPARISON:  Left knee radiographs 11/15/2020 FINDINGS: Interval total left knee arthroplasty. No perihardware lucency is seen to indicate  hardware failure or loosening. Expected postoperative changes including intra-articular and subcutaneous air. Smalljoint effusion. No acute fracture or dislocation. Mild atherosclerotic calcifications. IMPRESSION: Interval total left knee arthroplasty without evidence of hardware failure. Electronically Signed   By: Neita Garnet M.D.   On: 05/13/2023 16:26    Disposition: Discharge disposition: 03-Skilled Nursing Facility          Contact information for follow-up providers     Teryl Lucy, MD. Go on 05/28/2023.   Specialty: Orthopedic Surgery Why: your appointment is scheduled for 10:15. Contact information: [address] [place] [address] [phone]         [facility], [place]. Go on 05/28/2023.   Why: you will have 4 visits at home from our PT- Natalia Leatherwood and transition to the clinic on 4/9. they will set your time Contact information: [facility] [address] [place] [address] [phone]              Contact information for after-discharge care     Destination     [facility] Preferred SNF .   Service: Skilled Nursing Contact information: [address] [place] [address] [phone]                      Signed: Armida Sans PA-C 05/19/2023, 9:11 AM

## 2023-05-19 NOTE — Care Management Important Message (Signed)
 Important Message  Patient Details IM Letter given to the Patient. Name: Shelly Hickman MRN: [id number] Date of Birth: [birth year]   Important Message Given:  Yes - Medicare IM     Caren Macadam 05/19/2023, 8:59 AM

## 2023-05-19 NOTE — Progress Notes (Signed)
 PT Cancellation Note  Patient Details Name: Shelly Hickman MRN: [id number] DOB: [birth year]   Cancelled Treatment:    Reason Eval/Treat Not Completed: Fatigue/lethargy limiting ability to participate  Patient reports that she is exhausted after ambulating to and from [facility]. Reports that she will be going to rehab today. Blanchard Kelch PT Acute Rehabilitation Services Office [phone]  Rada Hay 05/19/2023, 10:51 AM

## 2023-05-19 NOTE — Progress Notes (Signed)
     Subjective: 6 Days Post-Op s/p Procedure(s): ARTHROPLASTY, KNEE, TOTAL   Patient is alert, oriented. Needing less pain medication over the weekend. States she's feeling much better.   Voiding well, urinary incontinence at baseline. Plan to discharge to SNF today.  Objective:  PE: VITALS:   Vitals:   05/18/23 0643 05/18/23 1311 05/18/23 2205 05/19/23 0618  BP: (!) 157/56 (!) 150/57 (!) 151/78 (!) 157/60  Pulse: 89 90 88 77  Resp:  17 16 16   Temp:  98.6 F (37 C) 98.2 F (36.8 C) 98.5 F (36.9 C)  TempSrc:  Oral Oral Oral  SpO2: 99% 98% 95% 94%  Weight:      Height:       Alert, sitting up in chair eating breakfast. In no acute distress, better color today Not on O2, no increased work of breathing Sensation intact distally Intact pulses distally Dorsiflexion/Plantar flexion intact Incision: dressing C/D/I  LABS  No results found for this or any previous visit (from the past 24 hours).   No results found.   Assessment/Plan: Principal Problem:   S/P total knee arthroplasty, left Active Problems:   S/P TKR (total knee replacement), left  6 Days Post-Op s/p Procedure(s): ARTHROPLASTY, KNEE, TOTAL - stable post-op imaging, left hip x-rays stable - up with PT, continue incentive spirometry - BP intermittently high, prn hydralazine   Weightbearing: WBAT LLE Insicional and dressing care: Reinforce dressings as needed VTE prophylaxis: Aspirin 325mg  BID x 30 days Pain control:  Continue current regimen Follow - up plan: 2 weeks with Dr. Dion Saucier  Dispo: Discharge to SNF this afternoon  Contact information:   Janine Ores, PA-C Weekdays 8-5  After hours and holidays please check [email or web address] for group call information for [facility]  Armida Sans 05/19/2023, 9:10 AM

## 2023-06-02 ENCOUNTER — Other Ambulatory Visit: Payer: Self-pay | Admitting: Family Medicine

## 2023-06-02 DIAGNOSIS — F5102 Adjustment insomnia: Secondary | ICD-10-CM

## 2023-06-04 ENCOUNTER — Telehealth: Payer: Self-pay

## 2023-06-04 ENCOUNTER — Other Ambulatory Visit: Payer: Self-pay | Admitting: Family Medicine

## 2023-06-04 ENCOUNTER — Telehealth: Payer: Self-pay | Admitting: Family Medicine

## 2023-06-04 DIAGNOSIS — F5102 Adjustment insomnia: Secondary | ICD-10-CM

## 2023-06-04 MED ORDER — TRAZODONE HCL 100 MG PO TABS: ORAL_TABLET | ORAL | 0 refills | Status: DC

## 2023-06-04 NOTE — Telephone Encounter (Unsigned)
 Copied from CRM [id number]. Topic: Clinical - Medication Refill >> Jun 04, 2023 12:46 PM Rosaria Common wrote: Most Recent Primary Care Visit:  Provider: Maryclare Smoke A  Department: [facility]  Visit Type: OFFICE VISIT 20  Date: 04/14/2023  Medication: traZODone (DESYREL) 100 MG tablet  Has the patient contacted their pharmacy? Yes (Agent: If no, request that the patient contact the pharmacy for the refill. If patient does not wish to contact the pharmacy document the reason why and proceed with request.) (Agent: If yes, when and what did the pharmacy advise?)  Is this the correct pharmacy for this prescription? Yes If no, delete pharmacy and type the correct one.  This is the patient's preferred pharmacy:  [facility] - [place], [place] - [address] [place] [address] Phone: [phone] Fax: [phone]   Has the prescription been filled recently? No  Is the patient out of the medication? Yes  Has the patient been seen for an appointment in the last year OR does the patient have an upcoming appointment? Yes  Can we respond through MyChart? Yes  Agent: Please be advised that Rx refills may take up to 3 business days. We ask that you follow-up with your pharmacy.

## 2023-06-04 NOTE — Telephone Encounter (Signed)
 Copied from CRM [id number]. Topic: Clinical - Prescription Issue >> Jun 03, 2023  4:40 PM Felizardo Hotter wrote: Reason for CRM: Pt called regarding traZODone (DESYREL) 100 MG tablet, pt stated she only has one left, told pt it needs to be approved by PCP.

## 2023-06-04 NOTE — Telephone Encounter (Signed)
 Last Fill: 02/20/23 90 tabs/0 RF  Last OV: 04/14/23 Next OV: 06/05/23  Routing to provider for review/authorization.    Copied from CRM [id number]. Topic: Clinical - Medication Refill >> Jun 04, 2023 12:46 PM Rosaria Common wrote: Most Recent Primary Care Visit:  Provider: Maryclare Smoke A  Department: [facility]  Visit Type: OFFICE VISIT 20  Date: 04/14/2023  Medication: traZODone (DESYREL) 100 MG tablet  Has the patient contacted their pharmacy? Yes (Agent: If no, request that the patient contact the pharmacy for the refill. If patient does not wish to contact the pharmacy document the reason why and proceed with request.) (Agent: If yes, when and what did the pharmacy advise?)  Is this the correct pharmacy for this prescription? Yes If no, delete pharmacy and type the correct one.  This is the patient's preferred pharmacy:  [facility] - [place], [place] - [address] [place] [address] Phone: [phone] Fax: [phone]   Has the prescription been filled recently? No  Is the patient out of the medication? Yes  Has the patient been seen for an appointment in the last year OR does the patient have an upcoming appointment? Yes  Can we respond through MyChart? Yes  Agent: Please be advised that Rx refills may take up to 3 business days. We ask that you follow-up with your pharmacy.

## 2023-06-05 ENCOUNTER — Ambulatory Visit (INDEPENDENT_AMBULATORY_CARE_PROVIDER_SITE_OTHER): Payer: PPO

## 2023-06-05 DIAGNOSIS — Z0000 Encounter for general adult medical examination without abnormal findings: Secondary | ICD-10-CM

## 2023-06-05 NOTE — Patient Instructions (Signed)
 Shelly Hickman , Thank you for taking time to come for your Medicare Wellness Visit. I appreciate your ongoing commitment to your health goals. Please review the following plan we discussed and let me know if I can assist you in the future.   Referrals/Orders/Follow-Ups/Clinician Recommendations: none   Managing Pain Without Opioids Opioids are strong medicines used to treat moderate to severe pain. For some people, especially those who have long-term (chronic) pain, opioids may not be the best choice for pain management due to: Side effects like nausea, constipation, and sleepiness. The risk of addiction (opioid use disorder). The longer you take opioids, the greater your risk of addiction. Pain that lasts for more than 3 months is called chronic pain. Managing chronic pain usually requires more than one approach and is often provided by a team of health care providers working together (multidisciplinary approach). Pain management may be done at a pain management center or pain clinic. How to manage pain without the use of opioids Use non-opioid medicines Non-opioid medicines for pain may include: Over-the-counter or prescription non-steroidal anti-inflammatory drugs (NSAIDs). These may be the first medicines used for pain. They work well for muscle and bone pain, and they reduce swelling. Acetaminophen. This over-the-counter medicine may work well for milder pain but not swelling. Antidepressants. These may be used to treat chronic pain. A certain type of antidepressant (tricyclics) is often used. These medicines are given in lower doses for pain than when used for depression. Anticonvulsants. These are usually used to treat seizures but may also reduce nerve (neuropathic) pain. Muscle relaxants. These relieve pain caused by sudden muscle tightening (spasms). You may also use a pain medicine that is applied to the skin as a patch, cream, or gel (topical analgesic), such as a numbing medicine. These  may cause fewer side effects than medicines taken by mouth. Do certain therapies as directed Some therapies can help with pain management. They include: Physical therapy. You will do exercises to gain strength and flexibility. A physical therapist may teach you exercises to move and stretch parts of your body that are weak, stiff, or painful. You can learn these exercises at physical therapy visits and practice them at home. Physical therapy may also involve: Massage. Heat wraps or applying heat or cold to affected areas. Electrical signals that interrupt pain signals (transcutaneous electrical nerve stimulation, TENS). Weak lasers that reduce pain and swelling (low-level laser therapy). Signals from your body that help you learn to regulate pain (biofeedback). Occupational therapy. This helps you to learn ways to function at home and work with less pain. Recreational therapy. This involves trying new activities or hobbies, such as a physical activity or drawing. Mental health therapy, including: Cognitive behavioral therapy (CBT). This helps you learn coping skills for dealing with pain. Acceptance and commitment therapy (ACT) to change the way you think and react to pain. Relaxation therapies, including muscle relaxation exercises and mindfulness-based stress reduction. Pain management counseling. This may be individual, family, or group counseling.  Receive medical treatments Medical treatments for pain management include: Nerve block injections. These may include a pain blocker and anti-inflammatory medicines. You may have injections: Near the spine to relieve chronic back or neck pain. Into joints to relieve back or joint pain. Into nerve areas that supply a painful area to relieve body pain. Into muscles (trigger point injections) to relieve some painful muscle conditions. A medical device placed near your spine to help block pain signals and relieve nerve pain or chronic back pain  (  spinal cord stimulation device). Acupuncture. Follow these instructions at home Medicines Take over-the-counter and prescription medicines only as told by your health care provider. If you are taking pain medicine, ask your health care providers about possible side effects to watch out for. Do not drive or use heavy machinery while taking prescription opioid pain medicine. Lifestyle  Do not use drugs or alcohol to reduce pain. If you drink alcohol, limit how much you have to: 0-1 drink a day for women who are not pregnant. 0-2 drinks a day for men. Know how much alcohol is in a drink. In the [place] one drink equals one 12 oz bottle of beer (355 mL), one 5 oz glass of wine (148 mL), or one 1 oz glass of hard liquor (44 mL). Do not use any products that contain nicotine or tobacco. These products include cigarettes, chewing tobacco, and vaping devices, such as e-cigarettes. If you need help quitting, ask your health care provider. Eat a healthy diet and maintain a healthy weight. Poor diet and excess weight may make pain worse. Eat foods that are high in fiber. These include fresh fruits and vegetables, whole grains, and beans. Limit foods that are high in fat and processed sugars, such as fried and sweet foods. Exercise regularly. Exercise lowers stress and may help relieve pain. Ask your health care provider what activities and exercises are safe for you. If your health care provider approves, join an exercise class that combines movement and stress reduction. Examples include yoga and tai chi. Get enough sleep. Lack of sleep may make pain worse. Lower stress as much as possible. Practice stress reduction techniques as told by your therapist. General instructions Work with all your pain management providers to find the treatments that work best for you. You are an important member of your pain management team. There are many things you can do to reduce pain on your own. Consider joining an  online or in-person support group for people who have chronic pain. Keep all follow-up visits. This is important. Where to find more information You can find more information about managing pain without opioids from: American Academy of Pain Medicine: painmed.org Institute for Chronic Pain: instituteforchronicpain.org American Chronic Pain Association: theacpa.org Contact a health care provider if: You have side effects from pain medicine. Your pain gets worse or does not get better with treatments or home therapy. You are struggling with anxiety or depression. Summary Many types of pain can be managed without opioids. Chronic pain may respond better to pain management without opioids. Pain is best managed when you and a team of health care providers work together. Pain management without opioids may include non-opioid medicines, medical treatments, physical therapy, mental health therapy, and lifestyle changes. Tell your health care providers if your pain gets worse or is not being managed well enough. This information is not intended to replace advice given to you by your health care provider. Make sure you discuss any questions you have with your health care provider. Document Revised: 05/17/2020 Document Reviewed: 05/17/2020 Elsevier Patient Education  2024 Elsevier Inc.  This is a list of the screening recommended for you and due dates:  Health Maintenance  Topic Date Due   COVID-19 Vaccine (5 - 2024-25 season) 10/20/2022   Flu Shot  09/19/2023   Medicare Annual Wellness Visit  06/04/2024   DTaP/Tdap/Td vaccine (4 - Td or Tdap) 12/27/2025   Pneumonia Vaccine  Completed   DEXA scan (bone density measurement)  Completed   Zoster (Shingles)  Vaccine  Completed   HPV Vaccine  Aged Out   Meningitis B Vaccine  Aged Out   Hepatitis C Screening  Discontinued    Advanced directives: (Copy Requested) Please bring a copy of your health care power of attorney and living will to the office  to be added to your chart at your convenience. You can mail to [facility] [address] [place], [place] [address] or email to [email or web address]  Next Medicare Annual Wellness Visit scheduled for next year: Yes  insert Preventive Care attachment Insert FALL PREVENTION attachment if needed

## 2023-06-05 NOTE — Telephone Encounter (Signed)
 Duplicate

## 2023-06-05 NOTE — Progress Notes (Signed)
 Subjective:   Shelly Hickman is a 83 y.o. who presents for a Medicare Wellness preventive visit.  Visit Complete: Virtual I connected with  Shelly Hickman on 06/05/23 by a audio enabled telemedicine application and verified that I am speaking with the correct person using two identifiers.  Patient Location: Home  Provider Location: Home Office  I discussed the limitations of evaluation and management by telemedicine. The patient expressed understanding and agreed to proceed.  Vital Signs: Because this visit was a virtual/telehealth visit, some criteria may be missing or patient reported. Any vitals not documented were not able to be obtained and vitals that have been documented are patient reported.  VideoError- Librarian, academic were attempted between this provider and patient, however failed, due to patient having technical difficulties OR patient did not have access to video capability.  We continued and completed visit with audio only.   Persons Participating in Visit: Patient.  AWV Questionnaire: Yes: Patient Medicare AWV questionnaire was completed by the patient on 06/04/2023; I have confirmed that all information answered by patient is correct and no changes since this date.  Cardiac Risk Factors include: advanced age (>86men, >47 women);dyslipidemia;hypertension     Objective:    Today's Vitals   06/05/23 1119  PainSc: 5    There is no height or weight on file to calculate BMI.     06/05/2023   11:34 AM 05/13/2023    4:00 PM 04/30/2023   12:55 PM 05/31/2022    9:56 AM 09/03/2016   10:46 AM 03/04/2016    7:54 AM 02/22/2016   11:13 AM  Advanced Directives  Does Patient Have a Medical Advance Directive? Yes Yes Yes Yes Yes  Yes  Type of Estate agent of [place];Living will Healthcare Power of [employer] of [place];Living will Living will Healthcare Power of [place];Living will  Healthcare Power of  [place];Living will  Does patient want to make changes to medical advance directive?  No - Patient declined  No - Patient declined     Copy of Healthcare Power of Attorney in Chart? No - copy requested     No - copy requested No - copy requested    Current Medications (verified) Outpatient Encounter Medications as of 06/05/2023  Medication Sig   acetaminophen (TYLENOL) 325 MG tablet Take 2 tablets (650 mg total) by mouth every 6 (six) hours as needed for mild pain (or Fever >/= 101).   amLODipine (NORVASC) 5 MG tablet Take 1 tablet (5 mg total) by mouth daily.   aspirin EC 325 MG tablet Take 1 tablet (325 mg total) by mouth 2 (two) times daily.   baclofen (LIORESAL) 10 MG tablet Take 1 tablet (10 mg total) by mouth 3 (three) times daily. As needed for muscle spasm   Calcium Carb-Cholecalciferol (CALCIUM 600-D PO) Take 1 tablet by mouth daily.   celecoxib (CELEBREX) 200 MG capsule Take 200 mg by mouth 2 (two) times daily as needed for moderate pain (pain score 4-6). Daily   diphenhydrAMINE (BENADRYL) 25 mg capsule Take 25 mg by mouth every 6 (six) hours as needed for itching or allergies.   DULoxetine (CYMBALTA) 60 MG capsule Take 2 capsules (120 mg total) by mouth daily.   ketoconazole (NIZORAL) 2 % cream Apply topically daily. (Patient taking differently: Apply 1 Application topically daily as needed for irritation.)   levothyroxine (SYNTHROID) 50 MCG tablet Take 1 tablet (50 mcg total) by mouth daily before breakfast.   losartan (COZAAR)  100 MG tablet Take 1 tablet (100 mg total) by mouth daily.   omega-3 acid ethyl esters (LOVAZA) 1 g capsule Take 2 capsules (2 g total) by mouth 2 (two) times daily.   oxyCODONE (ROXICODONE) 5 MG immediate release tablet Take 1 tablet (5 mg total) by mouth every 4 (four) hours as needed for severe pain (pain score 7-10).   sennosides-docusate sodium (SENOKOT-S) 8.6-50 MG tablet Take 2 tablets by mouth daily.   simvastatin (ZOCOR) 40 MG tablet Take 1 tablet  (40 mg total) by mouth at bedtime.   traZODone (DESYREL) 100 MG tablet TAKE 1 TABLET BY MOUTH AT BEDTIME FOR SLEEP   Vibegron (GEMTESA) 75 MG TABS Take 75 mg by mouth daily.   Cholecalciferol (VITAMIN D-3) 5000 units TABS Take 5,000 Units by mouth daily. (Patient not taking: Reported on 06/05/2023)   estradiol (ESTRACE) 0.1 MG/GM vaginal cream Place 1 Applicatorful vaginally See admin instructions. 3 nites per week for a month then once per week (Patient not taking: Reported on 06/05/2023)   ondansetron (ZOFRAN) 4 MG tablet Take 1 tablet (4 mg total) by mouth every 8 (eight) hours as needed for nausea or vomiting. (Patient not taking: Reported on 06/05/2023)   No facility-administered encounter medications on file as of 06/05/2023.    Allergies (verified) Avelox [moxifloxacin hcl in nacl], Cephalosporins, Bee venom, Other, and Niacin and related   History: Past Medical History:  Diagnosis Date   Allergy    Anxiety    Arthritis    Depression    GERD (gastroesophageal reflux disease)    H/O hiatal hernia    Hypercholesterolemia    Hypertension    Hypothyroidism    Incontinence of urine    Osteoarthritis    Pneumonia    hx of   Past Surgical History:  Procedure Laterality Date   2 Synovial Cysts removed between L4-L5     ABDOMINAL HYSTERECTOMY     BACK SURGERY     back fusion after 2 back surgeries    CARPAL TUNNEL RELEASE     COLONOSCOPY     EYE SURGERY     tube inserted for excessive tearing , tube is removed    JOINT REPLACEMENT     Right Knee Arthroscopy     SALPINGOOPHORECTOMY  1993   TONSILLECTOMY     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   Procedure: TOTAL HIP ARTHROPLASTY;  Surgeon: Nestor Lewandowsky, MD;  Location: [facility] OR;  Service: Orthopedics;  Laterality: Left;   TOTAL KNEE ARTHROPLASTY Right 03/04/2016   Procedure: TOTAL KNEE ARTHROPLASTY;  Surgeon: Salvatore Marvel, MD;  Location: [facility] OR;  Service: Orthopedics;  Laterality: Right;   TOTAL KNEE ARTHROPLASTY Left  05/13/2023   Procedure: ARTHROPLASTY, KNEE, TOTAL;  Surgeon: Teryl Lucy, MD;  Location: [facility] ORS;  Service: Orthopedics;  Laterality: Left;   WISDOM TOOTH EXTRACTION     Family History  Problem Relation Age of Onset   Hypertension Mother    Diabetes Mother    Stroke Mother    Alcoholism Father    Alcohol abuse Father    Depression Sister    Social History   Socioeconomic History   Marital status: Widowed    Spouse name: Not on file   Number of children: Not on file   Years of education: Not on file   Highest education level: Bachelor's degree (e.g., BA, AB, BS)  Occupational History   Not on file  Tobacco Use   Smoking status: Former    Current packs/day:  0.00    Average packs/day: 0.5 packs/day for 5.0 years (2.5 ttl pk-yrs)    Types: Cigarettes    Start date: 02/19/1960    Quit date: 02/18/1965    Years since quitting: 58.3    Passive exposure: Never   Smokeless tobacco: Never  Vaping Use   Vaping status: Never Used  Substance and Sexual Activity   Alcohol use: Not Currently    Comment: rare   Drug use: No   Sexual activity: Not Currently    Birth control/protection: Post-menopausal  Other Topics Concern   Not on file  Social History Narrative   Not on file   Social Drivers of Health   Financial Resource Strain: Low Risk  (06/04/2023)   Overall Financial Resource Strain (CARDIA)    Difficulty of Paying Living Expenses: Not very hard  Food Insecurity: No Food Insecurity (06/04/2023)   Hunger Vital Sign    Worried About Running Out of Food in the Last Year: Never true    Ran Out of Food in the Last Year: Never true  Transportation Needs: No Transportation Needs (06/04/2023)   PRAPARE - Administrator, Civil Service (Medical): No    Lack of Transportation (Non-Medical): No  Physical Activity: Insufficiently Active (06/04/2023)   Exercise Vital Sign    Days of Exercise per Week: 1 day    Minutes of Exercise per Session: 20 min  Stress: No Stress  Concern Present (06/04/2023)   [employer] of Occupational Health - Occupational Stress Questionnaire    Feeling of Stress : Only a little  Social Connections: Moderately Integrated (06/04/2023)   Social Connection and Isolation Panel [NHANES]    Frequency of Communication with Friends and Family: More than three times a week    Frequency of Social Gatherings with Friends and Family: Once a week    Attends Religious Services: 1 to 4 times per year    Active Member of [employer] or Organizations: Yes    Attends Banker Meetings: More than 4 times per year    Marital Status: Widowed  Recent Concern: Social Connections - Moderately Isolated (05/13/2023)   Social Connection and Isolation Panel [NHANES]    Frequency of Communication with Friends and Family: Three times a week    Frequency of Social Gatherings with Friends and Family: Once a week    Attends Religious Services: 1 to 4 times per year    Active Member of [employer] or Organizations: No    Attends Banker Meetings: Never    Marital Status: Widowed    Tobacco Counseling Counseling given: Not Answered    Clinical Intake:  Pre-visit preparation completed: Yes  Pain : 0-10 Pain Score: 5  Pain Type: Other (Comment) (surgical pain) Pain Location: Knee Pain Orientation: Left Pain Descriptors / Indicators: Aching Pain Onset: 1 to 4 weeks ago Pain Frequency: Constant     Nutritional Risks: Nausea/ vomitting/ diarrhea (slight diarrhea the other day, resolved) Diabetes: No  Lab Results  Component Value Date   HGBA1C 6.4 (H) 04/09/2023   HGBA1C 6.0 (H) 12/05/2022   HGBA1C 5.7 (H) 09/03/2022     How often do you need to have someone help you when you read instructions, pamphlets, or other written materials from your doctor or pharmacy?: 1 - Never  Interpreter Needed?: No  Information entered by :: NAllen LPN   Activities of Daily Living     06/05/2023   11:23 AM 05/13/2023    4:00 PM  In your  present state of health, do you have any difficulty performing the following activities:  Hearing? 1 0  Comment has hearing aids   Vision? 0 0  Difficulty concentrating or making decisions? 0 0  Walking or climbing stairs? 0   Dressing or bathing? 0   Doing errands, shopping? 0   Preparing Food and eating ? N   Using the Toilet? N   In the past six months, have you accidently leaked urine? Y   Comment has an urologist   Do you have problems with loss of bowel control? N   Managing your Medications? N   Managing your Finances? N   Housekeeping or managing your Housekeeping? N     Patient Care Team: Melida Quitter, PA as PCP - General (Family Medicine) Barnett Abu, MD as Consulting Physician (Neurosurgery) Nita Sells, MD as Consulting Physician (Dermatology) Salvatore Marvel, MD as Consulting Physician (Orthopedic Surgery) [facility] [place], MD as Attending Physician Melina Fiddler, MD as Consulting Physician (Sports Medicine)  Indicate any recent Medical Services you may have received from other than [facility] providers in the past year (date may be approximate).     Assessment:   This is a routine wellness examination for [place].  Hearing/Vision screen Hearing Screening - Comments:: Has hearing aids that are maintained Vision Screening - Comments:: Regular eye exams, [facility]   Goals Addressed             This Visit's Progress    Patient Stated       06/05/2023, wants to heal up from surgery       Depression Screen     06/05/2023   11:38 AM 04/14/2023    9:49 AM 12/12/2022   10:57 AM 09/10/2022   11:16 AM 05/31/2022   10:47 AM 05/31/2022   10:46 AM 03/01/2022   10:38 AM  PHQ 2/9 Scores  PHQ - 2 Score 0 1 2 1 2 2  0  PHQ- 9 Score 2 4 6 6 5 5 2     Fall Risk     06/05/2023   11:36 AM 04/14/2023    9:49 AM 09/10/2022   11:15 AM 05/31/2022    9:55 AM 07/18/2021    9:07 AM  Fall Risk   Falls in the past year? 1 0 0 0 1  Comment fell in bathroom       Number falls in past yr: 0 0 0 0 0  Injury with Fall? 0 0 0 0 1  Risk for fall due to : Impaired mobility;Impaired balance/gait No Fall Risks No Fall Risks History of fall(s) History of fall(s)  Follow up Falls prevention discussed;Falls evaluation completed Falls evaluation completed Falls evaluation completed  Falls evaluation completed    MEDICARE RISK AT HOME:  Medicare Risk at Home Any stairs in or around the home?: No (has a ramp) If so, are there any without handrails?: No Home free of loose throw rugs in walkways, pet beds, electrical cords, etc?: Yes Adequate lighting in your home to reduce risk of falls?: Yes Life alert?: No Use of a cane, walker or w/c?: Yes Grab bars in the bathroom?: Yes Shower chair or bench in shower?: Yes Elevated toilet seat or a handicapped toilet?: Yes  TIMED UP AND GO:  Was the test performed?  No  Cognitive Function: 6CIT completed        06/05/2023   11:39 AM 05/31/2022    9:55 AM 03/20/2021    9:21 AM 05/17/2019  1:21 PM 12/30/2016    2:49 PM  6CIT Screen  What Year? 0 points 0 points 0 points 0 points 0 points  What month? 0 points 0 points 0 points 0 points 0 points  What time? 0 points 0 points 0 points 0 points 0 points  Count back from 20 0 points 0 points 0 points 0 points 0 points  Months in reverse 0 points 0 points 0 points 0 points 0 points  Repeat phrase 0 points 0 points 0 points 0 points 0 points  Total Score 0 points 0 points 0 points 0 points 0 points    Immunizations Immunization History  Administered Date(s) Administered   Fluad Quad(high Dose 65+) 10/15/2018, 11/13/2020   Fluad Trivalent(High Dose 65+) 12/12/2022   IPV 01/20/2010   Influenza Split 01/23/2010   Influenza, High Dose Seasonal PF 11/17/2015, 11/22/2016, 01/18/2022   Influenza-Unspecified 11/26/2017   PFIZER(Purple Top)SARS-COV-2 Vaccination 03/13/2019, 04/03/2019, 12/03/2019   Pfizer Covid-19 Vaccine Bivalent Booster 29yrs & up 09/21/2020    Pneumococcal Conjugate-13 03/25/2008, 11/16/2013   Pneumococcal Polysaccharide-23 03/25/2008, 11/16/2012   Td 01/25/2001   Tdap 02/18/2010, 12/28/2015   Zoster Recombinant(Shingrix) 05/03/2021, 09/26/2021   Zoster, Live 06/26/2006, 02/19/2007, 11/16/2013    Screening Tests Health Maintenance  Topic Date Due   COVID-19 Vaccine (5 - 2024-25 season) 10/20/2022   INFLUENZA VACCINE  09/19/2023   Medicare Annual Wellness (AWV)  06/04/2024   DTaP/Tdap/Td (4 - Td or Tdap) 12/27/2025   Pneumonia Vaccine 24+ Years old  Completed   DEXA SCAN  Completed   Zoster Vaccines- Shingrix  Completed   HPV VACCINES  Aged Out   Meningococcal B Vaccine  Aged Out   Hepatitis C Screening  Discontinued    Health Maintenance  Health Maintenance Due  Topic Date Due   COVID-19 Vaccine (5 - 2024-25 season) 10/20/2022   Health Maintenance Items Addressed: Will check with pharmacy about covid vaccine.  Additional Screening:  Vision Screening: Recommended annual ophthalmology exams for early detection of glaucoma and other disorders of the eye.  Dental Screening: Recommended annual dental exams for proper oral hygiene  Community Resource Referral / Chronic Care Management: CRR required this visit?  No   CCM required this visit?  No     Plan:     I have personally reviewed and noted the following in the patient's chart:   Medical and social history Use of alcohol, tobacco or illicit drugs  Current medications and supplements including opioid prescriptions. Patient is currently taking opioid prescriptions. Information provided to patient regarding non-opioid alternatives. Patient advised to discuss non-opioid treatment plan with their provider. Functional ability and status Nutritional status Physical activity Advanced directives List of other physicians Hospitalizations, surgeries, and ER visits in previous 12 months Vitals Screenings to include cognitive, depression, and falls Referrals  and appointments  In addition, I have reviewed and discussed with patient certain preventive protocols, quality metrics, and best practice recommendations. A written personalized care plan for preventive services as well as general preventive health recommendations were provided to patient.     Barb Merino, LPN   1/61/0960   After Visit Summary: (MyChart) Due to this being a telephonic visit, the after visit summary with patients personalized plan was offered to patient via MyChart   Notes: Nothing significant to report at this time.

## 2023-06-06 ENCOUNTER — Other Ambulatory Visit: Payer: Self-pay | Admitting: Family Medicine

## 2023-06-06 DIAGNOSIS — F419 Anxiety disorder, unspecified: Secondary | ICD-10-CM

## 2023-06-06 DIAGNOSIS — F339 Major depressive disorder, recurrent, unspecified: Secondary | ICD-10-CM

## 2023-06-30 ENCOUNTER — Telehealth: Payer: Self-pay | Admitting: *Deleted

## 2023-06-30 ENCOUNTER — Other Ambulatory Visit: Payer: Self-pay | Admitting: Family Medicine

## 2023-06-30 NOTE — Telephone Encounter (Signed)
 Copied from CRM [id number]. Topic: Clinical - Prescription Issue >> Jun 30, 2023 10:41 AM Felizardo Hotter wrote: Reason for CRM: Pt called would like Dr. Arabella Beach to fill Temazepam  15 mg capsule.  Please send to: [facility] - [place], [place] - [address] [place] [address] Please call pt at [phone].

## 2023-06-30 NOTE — Telephone Encounter (Signed)
 Pt informed of below.

## 2023-06-30 NOTE — Telephone Encounter (Signed)
 It was last filled over one and a half years ago.  We can wait to refill it until I can discuss it with her at her upcoming appt.

## 2023-07-02 ENCOUNTER — Telehealth: Payer: Self-pay

## 2023-07-02 NOTE — Telephone Encounter (Signed)
 Pt was transferred to billing dept

## 2023-07-02 NOTE — Telephone Encounter (Signed)
 Copied from CRM [id number]. Topic: General - Billing Inquiry >> Jun 26, 2023 11:20 AM Shelly Hickman wrote: Reason for CRM: Patient called this morning regarding a billing issue related to her knee replacement surgery on 05/13/2023. She stated that her insurance denied the claim, potentially due to her extended hospital stay of 6 days, from 05/13/2023 through the following Monday. She was informed by someone that the length of stay might have contributed to the denial. The patient is requesting assistance from a patient advocate to understand the reason for the denial and to explore the next steps for resolving the issue. She has a scheduled appointment today at 2:30 PM and asked that, if she cannot be reached directly, a voicemail be left.

## 2023-07-12 NOTE — Progress Notes (Incomplete)
   Established Patient Office Visit  Subjective   Patient ID: Shelly Hickman, female    DOB: 12-21-1940  Age: 83 y.o. MRN: [id number]  No chief complaint on file.   HPI  Shelly Hickman is a 83 y.o. F who presents to the clinic today for follow up on mood. She is a previous patient of Maryclare Smoke, PA-C, and I am assuming her care. Her last visit in the clinic was on 04/14/23. At that time, pt reported that she felt her depression was worse due to her inability to move around due to her knee pain. She was currently on Cymbalta  60 mg and did not want to make any changes to her medications at that time because she was scheduled for a knee replacement on 05/13/23 and wanted to see if her mood improved following this procedure.   Today, patient reports that her mood is ***  Sleep ***  Denies SI/HI***  PHQ and GAD:  {History (Optional):23778}  ROS Per HPI.    Objective:     There were no vitals taken for this visit. {Vitals History (Optional):23777}  Physical Exam Constitutional:      General: She is not in acute distress.    Appearance: Normal appearance.  Cardiovascular:     Rate and Rhythm: Normal rate and regular rhythm.     Heart sounds: Normal heart sounds. No murmur heard.    No friction rub. No gallop.  Pulmonary:     Effort: Pulmonary effort is normal. No respiratory distress.     Breath sounds: Normal breath sounds.  Musculoskeletal:        General: No swelling.  Skin:    General: Skin is warm and dry.  Neurological:     General: No focal deficit present.     Mental Status: She is alert.  Psychiatric:        Mood and Affect: Mood normal.        Behavior: Behavior normal.        Thought Content: Thought content normal.      No results found for any visits on 07/15/23.  {Labs (Optional):23779}  The ASCVD Risk score (Arnett DK, et al., 2019) failed to calculate for the following reasons:   The 2019 ASCVD risk score is only valid for ages 7 to 63     Assessment & Plan:   There are no diagnoses linked to this encounter.  Assessment and Plan              No follow-ups on file.    Odilia Bennett, PA-C

## 2023-07-15 ENCOUNTER — Ambulatory Visit: Admitting: Family Medicine

## 2023-08-12 LAB — HM MAMMOGRAPHY

## 2023-08-18 ENCOUNTER — Other Ambulatory Visit: Payer: Self-pay

## 2023-08-25 ENCOUNTER — Other Ambulatory Visit: Payer: Self-pay | Admitting: Family Medicine

## 2023-08-25 DIAGNOSIS — I10 Essential (primary) hypertension: Secondary | ICD-10-CM

## 2023-08-25 DIAGNOSIS — E039 Hypothyroidism, unspecified: Secondary | ICD-10-CM

## 2023-08-25 NOTE — Telephone Encounter (Signed)
 LVM to call office to schedule appt and then send refills to last till that appt.

## 2023-09-08 ENCOUNTER — Encounter: Payer: Self-pay | Admitting: Family Medicine

## 2023-09-08 ENCOUNTER — Ambulatory Visit (INDEPENDENT_AMBULATORY_CARE_PROVIDER_SITE_OTHER): Admitting: Family Medicine

## 2023-09-08 VITALS — BP 141/84 | HR 84 | Ht 66.0 in | Wt 221.0 lb

## 2023-09-08 DIAGNOSIS — E782 Mixed hyperlipidemia: Secondary | ICD-10-CM

## 2023-09-08 DIAGNOSIS — E039 Hypothyroidism, unspecified: Secondary | ICD-10-CM

## 2023-09-08 DIAGNOSIS — M7989 Other specified soft tissue disorders: Secondary | ICD-10-CM | POA: Diagnosis not present

## 2023-09-08 DIAGNOSIS — N3946 Mixed incontinence: Secondary | ICD-10-CM

## 2023-09-08 DIAGNOSIS — F5102 Adjustment insomnia: Secondary | ICD-10-CM

## 2023-09-08 DIAGNOSIS — I10 Essential (primary) hypertension: Secondary | ICD-10-CM | POA: Diagnosis not present

## 2023-09-08 MED ORDER — GABAPENTIN 100 MG PO CAPS: 100.0000 mg | ORAL_CAPSULE | Freq: Every day | ORAL | 1 refills | Status: DC

## 2023-09-08 MED ORDER — METOPROLOL SUCCINATE ER 25 MG PO TB24: 25.0000 mg | ORAL_TABLET | Freq: Every day | ORAL | 1 refills | Status: DC

## 2023-09-08 MED ORDER — OLMESARTAN MEDOXOMIL 40 MG PO TABS: 40.0000 mg | ORAL_TABLET | Freq: Every day | ORAL | 1 refills | Status: DC

## 2023-09-08 NOTE — Assessment & Plan Note (Signed)
 Reports severe urinary incontinence, not responsive to Gemtessa. Has a cystoscopy scheduled next month with urology. - Continue current management pending urology evaluation.

## 2023-09-08 NOTE — Assessment & Plan Note (Signed)
 History of high blood pressure, including a reading of 229 systolic post-operatively. Current regimen of losartan  and amlodipine  is not providing adequate control. Amlodipine  dose will not be increased due to lower extremity edema. A diuretic is contraindicated due to existing urinary incontinence. Family history is significant for hypertension. - Discontinue losartan . Advised to store the remaining medication away to avoid confusion. - Start olmesartan . - Start metoprolol . - Will obtain labs today. - Follow up in one month with NP Cara for blood pressure check.

## 2023-09-08 NOTE — Assessment & Plan Note (Signed)
 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 Taking Celebrex  and Tylenol  with incomplete relief. Has increased trazodone  to 150mg  for sleep without adequate effect. Pain is localized to the knee without radiation. Left knee is swollen and tender on exam. - Start gabapentin  100mg  at night. May increase dose weekly by 100mg  to a maximum of 300mg , titrating to effect for pain and sleep. A new prescription will be sent. - Will obtain labs today, including a D-dimer to assess for DVT given unilateral leg swelling and pain. If positive, will proceed with an ultrasound. - Continue warm compresses for the eye sty. - To schedule a yearly physical.

## 2023-09-08 NOTE — Patient Instructions (Addendum)
 It was nice to see you today,  We addressed the following topics today: -For your blood pressure I have switched your losartan  to olmesartan .  Do not take the losartan  anymore - Take one half a tablet of olmesartan  for the first week and then increase it to 1 full tablet - I have also added metoprolol  to take daily for your blood pressure. - For your knee pain I have added gabapentin  100 mg at night.  Increase by 100 mg every week as needed up to 300 mg. - Continue using the hot compress on your eye until it resolves completely.   Have a great day,  Rolan Slain, MD

## 2023-09-08 NOTE — Progress Notes (Signed)
 Established Patient Office Visit  Subjective   Patient ID: Shelly Hickman, female    DOB: 10/17/1940  Age: 83 y.o. 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 Pain is a significant contributor to poor sleep. Still recovering from surgery. Last saw orthopedist (Drs. Josefina and Beverley Millman) two months ago, with follow-up scheduled for September. Reports not being compliant with prescribed exercises but is walking. Range of motion is pretty much okay. Describes low pain tolerance. - Insomnia, reports a terrible time sleeping. Was taking trazodone  100mg , has increased to 200mg  due to ineffectiveness. Feels this is causing a rapid depletion of the prescription. Reports feeling stressed from decluttering the house. - Hypertension. Blood pressure was noted to be high in the hospital after surgery (229 systolic). Does not check BP at home. - Leg swelling, affecting the ankles, particularly the left. Worse this morning. Was advised by Dr. Josefina to elevate the leg for an hour daily but has not been doing so. - Urinary incontinence. Reports a terrible incontinence problem, requiring frequent changes of high-absorbency pads day and night. Is up and down all night urinating. - Left eye sty. Started last week and was very bad initially, with associated cheek swelling. Has improved with warm compresses as advised by eye doctor (Dr. Octavia). Some discharge was noted. Sty is not completely resolved.  Medications Takes Tylenol  650mg  as needed for pain but is currently out. Has a prescription for Celebrex  once a day, to be taken with food, but sometimes forgets. Takes Cymbalta , two tablets once daily. Takes levothyroxine . Takes losartan  and amlodipine  for blood pressure. Emily Badder for incontinence, but reports it is not helping. Takes trazodone   for sleep, has increased dose to 200mg . Uses Estrace vaginal cream as prescribed by urology (Dr. Levin at [facility]). Is using Liquid IV for hydration. Stopped taking baclofen  after surgery. Took gabapentin  3-4 years ago but stopped.  PMH, PSH, FH, Social Hx PMHx: Hypertension, hypothyroidism, spastic colon (per history from October), history of severe muscle spasms post-operatively. 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 FH: Father and mother with hypertension (mother's systolic >200). Brother died of a heart attack in September 26, 1999.  ROS Constitutional: Denies fever. Reports fatigue due to poor sleep. Eyes: Reports left eye sty with improvement. Cardiovascular: Reports ankle swelling. Denies chest pain. Gastrointestinal: Reports history of spastic colon. Genitourinary: Reports significant urinary incontinence and nocturia. Musculoskeletal: Reports knee pain and swelling. Neurological: Denies radiation of pain. Psychiatric: Reports stress and poor sleep.   The ASCVD Risk score (Arnett DK, et al., Sep 25, 2017) failed to calculate for the following reasons:   The 09/25/17 ASCVD risk score is only valid for ages 43 to 38  Health Maintenance Due  Topic Date Due   COVID-19 Vaccine (5 - 2024-25 season) 10/20/2022      Objective:     BP (!) 141/84   Pulse 84   Ht 5' 6 (1.676 m)   Wt 221 lb (100.2 kg)   SpO2 96%   BMI 35.67 kg/m    Physical Exam General: Appears tender on examination of the knee. Cv: rrr, no murmur Extremities: Swelling noted in the left knee, particularly on one side. Tender to palpation. Ankle swelling also reported.   No results found for any visits on 09/08/23.      Assessment & Plan:   Benign  hypertension Assessment & Plan: History of high blood pressure, including a reading of 229 systolic post-operatively. Current regimen of losartan  and amlodipine  is not providing adequate control. Amlodipine  dose will not be increased due to lower extremity edema. A  diuretic is contraindicated due to existing urinary incontinence. Family history is significant for hypertension. - Discontinue losartan . Advised to store the remaining medication away to avoid confusion. - Start olmesartan . - Start metoprolol . - Will obtain labs today. - Follow up in one month with NP Cara for blood pressure check.  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 Taking Celebrex  and Tylenol  with incomplete relief. Has increased trazodone  to 150mg  for sleep without adequate effect. Pain is localized to the knee without radiation. Left knee is swollen and tender on exam. - Start gabapentin  100mg  at night. May increase dose weekly by 100mg  to a maximum of 300mg , titrating to effect for pain and sleep. A new prescription will be sent. - Will obtain labs today, including a D-dimer to assess for DVT given unilateral leg swelling and pain. If positive, will proceed with an ultrasound. - Continue warm compresses for the eye sty. - To schedule a yearly physical.   Mixed stress and urge urinary incontinence Assessment & Plan: Reports severe urinary incontinence, not responsive to Gemtessa. Has a cystoscopy scheduled next month with urology. - Continue current management pending urology evaluation.   Other orders -     Olmesartan  Medoxomil; Take 1 tablet (40 mg total) by mouth daily.  Dispense: 30 tablet; Refill: 1 -     Metoprolol  Succinate ER; Take 1 tablet (25 mg total) by mouth daily.  Dispense: 30 tablet; Refill: 1 -     Gabapentin ; Take 1 capsule (100 mg total) by mouth at bedtime. Increase by 100mg  each week as needed up to 300mg .  Dispense: 90 capsule; Refill: 1     Return in about 4 weeks  (around 10/06/2023) for physical-with kara.    Toribio MARLA Slain, MD

## 2023-09-09 ENCOUNTER — Other Ambulatory Visit: Payer: Self-pay | Admitting: Family Medicine

## 2023-09-09 DIAGNOSIS — F5102 Adjustment insomnia: Secondary | ICD-10-CM

## 2023-09-11 ENCOUNTER — Ambulatory Visit: Payer: Self-pay | Admitting: Family Medicine

## 2023-09-11 DIAGNOSIS — R600 Localized edema: Secondary | ICD-10-CM

## 2023-09-11 LAB — CBC WITH DIFFERENTIAL/PLATELET
Basophils Absolute: 0.1 x10E3/uL (ref 0.0–0.2)
Basos: 1 %
EOS (ABSOLUTE): 0.6 x10E3/uL — ABNORMAL HIGH (ref 0.0–0.4)
Eos: 6 %
Hematocrit: 42.3 % (ref 34.0–46.6)
Hemoglobin: 13.5 g/dL (ref 11.1–15.9)
Immature Grans (Abs): 0 x10E3/uL (ref 0.0–0.1)
Immature Granulocytes: 0 %
Lymphocytes Absolute: 1.7 x10E3/uL (ref 0.7–3.1)
Lymphs: 18 %
MCH: 27.7 pg (ref 26.6–33.0)
MCHC: 31.9 g/dL (ref 31.5–35.7)
MCV: 87 fL (ref 79–97)
Monocytes Absolute: 0.9 x10E3/uL (ref 0.1–0.9)
Monocytes: 10 %
Neutrophils Absolute: 6 x10E3/uL (ref 1.4–7.0)
Neutrophils: 65 %
Platelets: 370 x10E3/uL (ref 150–450)
RBC: 4.87 x10E6/uL (ref 3.77–5.28)
RDW: 13.1 % (ref 11.7–15.4)
WBC: 9.2 x10E3/uL (ref 3.4–10.8)

## 2023-09-11 LAB — COMPREHENSIVE METABOLIC PANEL WITH GFR
ALT: 31 IU/L (ref 0–32)
AST: 32 IU/L (ref 0–40)
Albumin: 4.2 g/dL (ref 3.7–4.7)
Alkaline Phosphatase: 78 IU/L (ref 44–121)
BUN/Creatinine Ratio: 28 (ref 12–28)
BUN: 21 mg/dL (ref 8–27)
Bilirubin Total: 0.2 mg/dL (ref 0.0–1.2)
CO2: 19 mmol/L — ABNORMAL LOW (ref 20–29)
Calcium: 9.7 mg/dL (ref 8.7–10.3)
Chloride: 97 mmol/L (ref 96–106)
Creatinine, Ser: 0.75 mg/dL (ref 0.57–1.00)
Globulin, Total: 2.6 g/dL (ref 1.5–4.5)
Glucose: 126 mg/dL — ABNORMAL HIGH (ref 70–99)
Potassium: 4.8 mmol/L (ref 3.5–5.2)
Sodium: 132 mmol/L — AB (ref 134–144)
Total Protein: 6.8 g/dL (ref 6.0–8.5)
eGFR: 80 mL/min/1.73 (ref >59)

## 2023-09-11 LAB — TSH: TSH: 3.8 u[IU]/mL (ref 0.450–4.500)

## 2023-09-11 LAB — LIPID PANEL
Chol/HDL Ratio: 3.4 ratio (ref 0.0–4.4)
Cholesterol, Total: 151 mg/dL (ref 100–199)
HDL: 44 mg/dL (ref >39)
LDL Chol Calc (NIH): 85 mg/dL (ref 0–99)
Triglycerides: 122 mg/dL (ref 0–149)
VLDL Cholesterol Cal: 22 mg/dL (ref 5–40)

## 2023-09-11 LAB — D-DIMER, QUANTITATIVE: D-DIMER: 1.13 mg{FEU}/L — ABNORMAL HIGH (ref 0.00–0.49)

## 2023-09-12 ENCOUNTER — Ambulatory Visit (HOSPITAL_COMMUNITY)
Admission: RE | Admit: 2023-09-12 | Discharge: 2023-09-12 | Disposition: A | Discharge status: Home / Self Care | Source: Ambulatory Visit | Attending: Family Medicine | Admitting: Family Medicine

## 2023-09-12 DIAGNOSIS — R600 Localized edema: Secondary | ICD-10-CM | POA: Diagnosis present

## 2023-09-13 ENCOUNTER — Ambulatory Visit: Payer: Self-pay | Admitting: Family Medicine

## 2023-10-16 ENCOUNTER — Ambulatory Visit (INDEPENDENT_AMBULATORY_CARE_PROVIDER_SITE_OTHER)

## 2023-10-16 VITALS — BP 139/72 | Ht 66.0 in

## 2023-10-16 DIAGNOSIS — Z0000 Encounter for general adult medical examination without abnormal findings: Secondary | ICD-10-CM

## 2023-10-16 DIAGNOSIS — R7303 Prediabetes: Secondary | ICD-10-CM | POA: Diagnosis not present

## 2023-10-16 DIAGNOSIS — F5102 Adjustment insomnia: Secondary | ICD-10-CM

## 2023-10-16 DIAGNOSIS — F32A Depression, unspecified: Secondary | ICD-10-CM

## 2023-10-16 DIAGNOSIS — Z6835 Body mass index (BMI) 35.0-35.9, adult: Secondary | ICD-10-CM

## 2023-10-16 DIAGNOSIS — E039 Hypothyroidism, unspecified: Secondary | ICD-10-CM

## 2023-10-16 DIAGNOSIS — E66812 Obesity, class 2: Secondary | ICD-10-CM

## 2023-10-16 DIAGNOSIS — I10 Essential (primary) hypertension: Secondary | ICD-10-CM

## 2023-10-16 DIAGNOSIS — E782 Mixed hyperlipidemia: Secondary | ICD-10-CM

## 2023-10-16 LAB — POCT GLYCOSYLATED HEMOGLOBIN (HGB A1C): Hemoglobin A1C: 6.1 % — AB (ref 4.0–5.6)

## 2023-10-16 MED ORDER — METOPROLOL SUCCINATE ER 50 MG PO TB24: 50.0000 mg | ORAL_TABLET | Freq: Every day | ORAL | 3 refills | Status: AC

## 2023-10-16 NOTE — Progress Notes (Signed)
 Complete physical exam  Patient: Shelly Hickman   DOB: December 27, 1940   83 y.o. Female  MRN: [id number]  Subjective:    Chief Complaint  Patient presents with   Annual Exam    Physical   History of Present Illness   Shelly Hickman is an 83 year old female who presents for an annual physical exam. Reports she is sleeping well. Overall feeling well. Eating well balanced and tries to walk for exercise. Reports regular bowel movements. Would like to discuss options for weight management.  Hypertension - Currently taking metoprolol , olmesartan , and amlodipine  in the morning - Does not monitor blood pressure at home due to difficulty using wrist monitor - Takes 81 mg aspirin  daily - No dizziness or weakness - Denies HA, CP, vision changes, edema  Urinary incontinence - Urinary incontinence managed with Gemtesa, more effective when taken during the day - Cystoscopy performed at [facility] improved urge incontinence - Uses multiple pads daily due to incontinence - Follow-up with urologist Dr. Marykay scheduled before Christmas  Sleep disturbance and restless legs - Takes trazodone  100 mg for sleep - Gabapentin  started at 100 mg, recently increased to 200 mg for sleep and possible restless leg syndrome  Chronic medical therapy - Simvastatin  40 mg for hyperlipidemia - Levothyroxine  50 mcg for hypothyroidism - Duloxetine  120 mg for depression  Weight management concerns - Interested in weight loss options - Concerned about cost of medications not covered by insurance - Fixed income with financial concerns regarding medication costs and recent home repairs       Most recent fall risk assessment:    10/16/2023    3:16 PM  Fall Risk   Falls in the past year? 0  Follow up Falls evaluation completed     Most recent depression screenings:    10/16/2023    3:16 PM 09/08/2023    8:20 AM  PHQ 2/9 Scores  PHQ - 2 Score 2 0  PHQ- 9 Score 4 0    Vision:Within last year  and Dental: No current dental problems and Receives regular dental care    Patient Care Team: Gayle Saddie JULIANNA DEVONNA as PCP - General (Physician Assistant) Colon Shove, MD as Consulting Physician (Neurosurgery) Shona Rush, MD as Consulting Physician (Dermatology) Jane Charleston, MD as Consulting Physician (Orthopedic Surgery) [facility], [place], MD as Attending Physician Orpha Asberry RAMAN, MD as Consulting Physician (Sports Medicine)   Outpatient Medications Prior to Visit  Medication Sig   acetaminophen  (TYLENOL ) 325 MG tablet Take 2 tablets (650 mg total) by mouth every 6 (six) hours as needed for mild pain (or Fever >/= 101).   amLODipine  (NORVASC ) 5 MG tablet TAKE 1 TABLET BY MOUTH DAILY   aspirin  EC 325 MG tablet Take 1 tablet (325 mg total) by mouth 2 (two) times daily.   Calcium  Carb-Cholecalciferol  (CALCIUM  600-D PO) Take 1 tablet by mouth daily.   celecoxib  (CELEBREX ) 200 MG capsule Take 200 mg by mouth 2 (two) times daily as needed for moderate pain (pain score 4-6). Daily   Cholecalciferol  (VITAMIN D -3) 5000 units TABS Take 5,000 Units by mouth daily. (Patient not taking: Reported on 09/08/2023)   diphenhydrAMINE  (BENADRYL ) 25 mg capsule Take 25 mg by mouth every 6 (six) hours as needed for itching or allergies.   DULoxetine  (CYMBALTA ) 60 MG capsule TAKE 2 CAPSULES BY MOUTH DAILY   estradiol (ESTRACE) 0.1 MG/GM vaginal cream Place 1 Applicatorful vaginally See admin instructions. 3 nites per week for a month then  once per week (Patient not taking: Reported on 09/08/2023)   gabapentin  (NEURONTIN ) 100 MG capsule Take 1 capsule (100 mg total) by mouth at bedtime. Increase by 100mg  each week as needed up to 300mg .   ketoconazole  (NIZORAL ) 2 % cream Apply topically daily. (Patient taking differently: Apply 1 Application topically daily as needed for irritation.)   levothyroxine  (SYNTHROID ) 50 MCG tablet TAKE 1 TABLET BY MOUTH DAILY BEFORE BREAKFAST   olmesartan  (BENICAR ) 40 MG  tablet Take 1 tablet (40 mg total) by mouth daily.   omega-3 acid ethyl esters (LOVAZA ) 1 g capsule Take 2 capsules (2 g total) by mouth 2 (two) times daily.   ondansetron  (ZOFRAN ) 4 MG tablet Take 1 tablet (4 mg total) by mouth every 8 (eight) hours as needed for nausea or vomiting. (Patient not taking: Reported on 09/08/2023)   sennosides-docusate sodium  (SENOKOT-S) 8.6-50 MG tablet Take 2 tablets by mouth daily.   simvastatin  (ZOCOR ) 40 MG tablet Take 1 tablet (40 mg total) by mouth at bedtime.   traZODone  (DESYREL ) 100 MG tablet TAKE 1 TABLET BY MOUTH DAILY AT BEDTIME AS NEEDED FOR SLEEP   Vibegron (GEMTESA) 75 MG TABS Take 75 mg by mouth daily.   [DISCONTINUED] metoprolol  succinate (TOPROL -XL) 25 MG 24 hr tablet Take 1 tablet (25 mg total) by mouth daily.   No facility-administered medications prior to visit.    ROS  Per HPI      Objective:     BP 139/72   Ht 5' 6 (1.676 m)   BMI 35.67 kg/m    Physical Exam Constitutional:      General: She is not in acute distress.    Appearance: Normal appearance.  HENT:     Right Ear: Tympanic membrane normal.     Left Ear: Tympanic membrane normal.     Mouth/Throat:     Mouth: Mucous membranes are moist.     Pharynx: Oropharynx is clear.  Eyes:     Pupils: Pupils are equal, round, and reactive to light.  Cardiovascular:     Rate and Rhythm: Normal rate and regular rhythm.     Heart sounds: Normal heart sounds. No murmur heard.    No friction rub. No gallop.  Pulmonary:     Effort: Pulmonary effort is normal. No respiratory distress.     Breath sounds: Normal breath sounds.  Abdominal:     General: Abdomen is flat. Bowel sounds are normal.     Palpations: Abdomen is soft.  Musculoskeletal:        General: No swelling.     Cervical back: Normal range of motion.  Lymphadenopathy:     Cervical: No cervical adenopathy.  Skin:    General: Skin is warm and dry.  Neurological:     General: No focal deficit present.      Mental Status: She is alert.  Psychiatric:        Mood and Affect: Mood normal.        Behavior: Behavior normal.        Thought Content: Thought content normal.     Results for orders placed or performed in visit on 10/16/23  POCT HgB A1C  Result Value Ref Range   Hemoglobin A1C 6.1 (A) 4.0 - 5.6 %   HbA1c POC (<> result, manual entry)     HbA1c, POC (prediabetic range)     HbA1c, POC (controlled diabetic range)     Last CBC Lab Results  Component Value Date   WBC 9.2 09/08/2023  HGB 13.5 09/08/2023   HCT 42.3 09/08/2023   MCV 87 09/08/2023   MCH 27.7 09/08/2023   RDW 13.1 09/08/2023   PLT 370 09/08/2023   Last metabolic panel Lab Results  Component Value Date   GLUCOSE 126 (H) 09/08/2023   NA 132 (L) 09/08/2023   K 4.8 09/08/2023   CL 97 09/08/2023   CO2 19 (L) 09/08/2023   BUN 21 09/08/2023   CREATININE 0.75 09/08/2023   EGFR 80 09/08/2023   CALCIUM  9.7 09/08/2023   PROT 6.8 09/08/2023   ALBUMIN  4.2 09/08/2023   LABGLOB 2.6 09/08/2023   AGRATIO 1.8 05/27/2022   BILITOT 0.2 09/08/2023   ALKPHOS 78 09/08/2023   AST 32 09/08/2023   ALT 31 09/08/2023   ANIONGAP 12 05/16/2023   Last lipids Lab Results  Component Value Date   CHOL 151 09/08/2023   HDL 44 09/08/2023   LDLCALC 85 09/08/2023   TRIG 122 09/08/2023   CHOLHDL 3.4 09/08/2023   Last hemoglobin A1c Lab Results  Component Value Date   HGBA1C 6.1 (A) 10/16/2023   Last thyroid  functions Lab Results  Component Value Date   TSH 3.800 09/08/2023   Last vitamin D  Lab Results  Component Value Date   VD25OH 36.9 11/13/2020        Assessment & Plan:    Routine Health Maintenance and Physical Exam  Health Maintenance  Topic Date Due   COVID-19 Vaccine (5 - 2024-25 season) 10/20/2022   Flu Shot  09/19/2023   Medicare Annual Wellness Visit  06/04/2024   DTaP/Tdap/Td vaccine (4 - Td or Tdap) 12/27/2025   Pneumococcal Vaccine for age over 5  Completed   DEXA scan (bone density measurement)   Completed   Zoster (Shingles) Vaccine  Completed   HPV Vaccine  Aged Out   Meningitis B Vaccine  Aged Out   Hepatitis C Screening  Discontinued    Discussed health benefits of physical activity, and encouraged her to engage in regular exercise appropriate for her age and condition.  Prediabetes Assessment & Plan: A1c improved at 6.1. Will continue to monitor. Continue aiming to get regular exercise and limit intake of foods and drinks high in carbs/sugar.   Orders: -     POCT glycosylated hemoglobin (Hb A1C)  Class 2 severe obesity with serious comorbidity and body mass index (BMI) of 35.0 to 35.9 in adult, unspecified obesity type (HCC)  Acquired hypothyroidism Assessment & Plan: TSH within normal limits. Continue levothyroxine  50 mcg daily.   Benign hypertension Assessment & Plan: BP Goal < 130/80. Above goal today in clinic and on recheck, however readings have improved since adding metoprolol  last month. Amlodipine  dose will not be increased due to lower extremity edema. A diuretic is contraindicated due to existing urinary incontinence.  - Increase metoprolol  to 50 mg daily. - Continue olmesartan  40 mg daily  - Continue amlodipine  5 mg daily   Will update CMP in 3 months. Will cont to monitor.   Morbid obesity (HCC) Assessment & Plan: Discussed weight loss options including GLP-1 medications. Insurance coverage uncertain and out-of-pocket costs prohibitive. - Instruct to contact insurance benefits department to inquire about coverage for GLP-1 medications for weight loss. - Provide written instructions for insurance inquiry. - For now, continue low calorie diet (>1800 cals per day) and increase daily exercise routine to support weight loss.   Mixed hyperlipidemia Assessment & Plan: Last lipid panel: LDL 85, HDL 44, Trig 122. The ASCVD Risk score (Arnett DK, et al., 2019) failed  to calculate for the following reasons:   The 2019 ASCVD risk score is only valid for ages  83 to 39 Continue simvastatin  40 mg daily, Lovaza  2 mg BID.   Depression, unspecified depression type Assessment & Plan: Well-managed on current regimen of duloxetine . - Continue duloxetine  120 mg daily.   Adjustment insomnia Assessment & Plan: Managed with trazodone . Reports improved sleep. - Continue trazodone  100 mg at bedtime. - Continue gabapentin  200 mg at bedtime, with option to increase to 300 mg if needed.   Healthcare maintenance Assessment & Plan: Routine visit with good lab results. Adjusted hypertension management and discussed weight loss options and insurance coverage for GLP-1 medications. - Perform A1c test via finger prick. - Add to flu shot list.   Other orders -     Metoprolol  Succinate ER; Take 1 tablet (50 mg total) by mouth daily. Take with or immediately following a meal.  Dispense: 90 tablet; Refill: 3   Return in about 3 months (around 01/16/2024) for HTN.     Saddie JULIANNA Sacks, PA-C

## 2023-10-16 NOTE — Assessment & Plan Note (Signed)
 Last lipid panel: LDL 85, HDL 44, Trig 122. The ASCVD Risk score (Arnett DK, et al., 2019) failed to calculate for the following reasons:   The 2019 ASCVD risk score is only valid for ages 61 to 55 Continue simvastatin  40 mg daily, Lovaza  2 mg BID.

## 2023-10-16 NOTE — Assessment & Plan Note (Signed)
 Routine visit with good lab results. Adjusted hypertension management and discussed weight loss options and insurance coverage for GLP-1 medications. - Perform A1c test via finger prick. - Add to flu shot list.

## 2023-10-16 NOTE — Assessment & Plan Note (Signed)
 BP Goal < 130/80. Above goal today in clinic and on recheck, however readings have improved since adding metoprolol  last month. Amlodipine  dose will not be increased due to lower extremity edema. A diuretic is contraindicated due to existing urinary incontinence.  - Increase metoprolol  to 50 mg daily. - Continue olmesartan  40 mg daily  - Continue amlodipine  5 mg daily   Will update CMP in 3 months. Will cont to monitor.

## 2023-10-16 NOTE — Assessment & Plan Note (Signed)
 Well-managed on current regimen of duloxetine . - Continue duloxetine  120 mg daily.

## 2023-10-16 NOTE — Assessment & Plan Note (Signed)
 Managed with trazodone . Reports improved sleep. - Continue trazodone  100 mg at bedtime. - Continue gabapentin  200 mg at bedtime, with option to increase to 300 mg if needed.

## 2023-10-16 NOTE — Assessment & Plan Note (Signed)
 A1c improved at 6.1. Will continue to monitor. Continue aiming to get regular exercise and limit intake of foods and drinks high in carbs/sugar.

## 2023-10-16 NOTE — Patient Instructions (Addendum)
 VISIT SUMMARY: Today, you had your annual physical exam. We reviewed your current health conditions, adjusted your hypertension medication, discussed weight loss options, and addressed your financial concerns regarding medication costs. Your lab results were good, and we made a few changes to your treatment plan to better manage your health.  YOUR PLAN: -HYPERTENSION: Hypertension means high blood pressure. We increased your metoprolol  dose to 50 mg daily and discontinued your current 25 mg tablets. Please monitor your blood pressure at home and record the readings. Please continue your olmesartan  and amlodipine  as prescribed for blood pressure.  We will follow up in 3 months to see how you are doing.  -OBESITY: Obesity means having excess body weight. We discussed weight loss options, including medications that might help. Please call your insurance company (phone number on the insurance card) and ask for the benefits department. From there, ask them if your plan covers Wegovy or Zepbound for weight loss and if there are any requirements you need to meet for coverage. If they say yes, please call the office to let me know and I will send in the required paperwork and the prescription.   -URGE URINARY INCONTINENCE: Urge urinary incontinence is a sudden, intense urge to urinate followed by involuntary loss of urine. Your condition has improved with Gemtesa and a recent cystoscopy. Continue taking Gemtesa during the daytime and follow up with your urologist before Christmas.  -HYPERLIPIDEMIA: Hyperlipidemia means having high levels of fats (lipids) in your blood. Your cholesterol levels are well-controlled with simvastatin . Continue taking simvastatin  40 mg daily.  -HYPOTHYROIDISM: Hypothyroidism means your thyroid  gland is underactive. Your condition is well-managed with levothyroxine . Continue taking levothyroxine  50 mcg daily.  -DEPRESSION: Depression is a mood disorder that causes persistent feelings  of sadness and loss of interest. Your condition is well-managed with duloxetine . Continue taking duloxetine  60 mg daily twice daily.   -INSOMNIA: Insomnia means having trouble falling or staying asleep. Your sleep has improved with trazodone . Continue taking trazodone  100 mg at bedtime and gabapentin  200 mg at bedtime. You have the option to increase gabapentin  to 300 mg if needed.  INSTRUCTIONS: Please perform an A1c test via finger prick and get a flu shot. Follow up in 3 months to assess blood pressure control.  If you have any problems before your next visit feel free to message me via MyChart (minor issues or questions) or call the office, otherwise you may reach out to schedule an office visit.  Thank you! Saddie Sacks, PA-C

## 2023-10-16 NOTE — Assessment & Plan Note (Signed)
 Discussed weight loss options including GLP-1 medications. Insurance coverage uncertain and out-of-pocket costs prohibitive. - Instruct to contact insurance benefits department to inquire about coverage for GLP-1 medications for weight loss. - Provide written instructions for insurance inquiry. - For now, continue low calorie diet (>1800 cals per day) and increase daily exercise routine to support weight loss.

## 2023-10-16 NOTE — Assessment & Plan Note (Signed)
TSH within normal limits.  Continue levothyroxine 50 mcg daily.

## 2023-10-24 ENCOUNTER — Ambulatory Visit

## 2023-11-01 ENCOUNTER — Other Ambulatory Visit: Payer: Self-pay | Admitting: Family Medicine

## 2023-11-07 ENCOUNTER — Ambulatory Visit (INDEPENDENT_AMBULATORY_CARE_PROVIDER_SITE_OTHER)

## 2023-11-07 VITALS — Temp 98.5°F

## 2023-11-07 DIAGNOSIS — Z23 Encounter for immunization: Secondary | ICD-10-CM | POA: Diagnosis not present

## 2023-11-07 NOTE — Progress Notes (Signed)
 Pt in for flu vaccine, pt tolerated and VIS was given.

## 2023-12-03 ENCOUNTER — Other Ambulatory Visit: Payer: Self-pay | Admitting: Family Medicine

## 2023-12-03 DIAGNOSIS — F339 Major depressive disorder, recurrent, unspecified: Secondary | ICD-10-CM

## 2023-12-03 DIAGNOSIS — F419 Anxiety disorder, unspecified: Secondary | ICD-10-CM

## 2023-12-09 ENCOUNTER — Other Ambulatory Visit: Payer: Self-pay

## 2023-12-09 DIAGNOSIS — F5102 Adjustment insomnia: Secondary | ICD-10-CM

## 2023-12-30 ENCOUNTER — Other Ambulatory Visit: Payer: Self-pay | Admitting: Family Medicine

## 2024-01-05 ENCOUNTER — Other Ambulatory Visit: Payer: Self-pay

## 2024-01-19 ENCOUNTER — Ambulatory Visit (INDEPENDENT_AMBULATORY_CARE_PROVIDER_SITE_OTHER)

## 2024-01-19 VITALS — BP 127/76 | HR 74 | Temp 98.3°F | Ht 66.0 in | Wt 224.0 lb

## 2024-01-19 DIAGNOSIS — E781 Pure hyperglyceridemia: Secondary | ICD-10-CM | POA: Diagnosis not present

## 2024-01-19 DIAGNOSIS — I10 Essential (primary) hypertension: Secondary | ICD-10-CM

## 2024-01-19 DIAGNOSIS — F5102 Adjustment insomnia: Secondary | ICD-10-CM

## 2024-01-19 DIAGNOSIS — E782 Mixed hyperlipidemia: Secondary | ICD-10-CM | POA: Diagnosis not present

## 2024-01-19 DIAGNOSIS — E66812 Obesity, class 2: Secondary | ICD-10-CM | POA: Diagnosis not present

## 2024-01-19 DIAGNOSIS — E039 Hypothyroidism, unspecified: Secondary | ICD-10-CM

## 2024-01-19 DIAGNOSIS — M1711 Unilateral primary osteoarthritis, right knee: Secondary | ICD-10-CM

## 2024-01-19 DIAGNOSIS — Z6835 Body mass index (BMI) 35.0-35.9, adult: Secondary | ICD-10-CM

## 2024-01-19 MED ORDER — OMEGA-3-ACID ETHYL ESTERS 1 G PO CAPS: 2.0000 | ORAL_CAPSULE | Freq: Two times a day (BID) | ORAL | 1 refills | Status: AC

## 2024-01-19 MED ORDER — OLMESARTAN MEDOXOMIL 40 MG PO TABS: 40.0000 mg | ORAL_TABLET | Freq: Every day | ORAL | 1 refills | Status: AC

## 2024-01-19 MED ORDER — GABAPENTIN 100 MG PO CAPS: 300.0000 mg | ORAL_CAPSULE | Freq: Every day | ORAL | 1 refills | Status: AC

## 2024-01-19 MED ORDER — SIMVASTATIN 40 MG PO TABS: 40.0000 mg | ORAL_TABLET | Freq: Every day | ORAL | 3 refills | Status: AC

## 2024-01-19 NOTE — Assessment & Plan Note (Signed)
 Following with ortho. Continue Celebrex  for pain control.

## 2024-01-19 NOTE — Progress Notes (Signed)
 Established Patient Office Visit  Subjective   Patient ID: Shelly Hickman, female    DOB: [birth year]  Age: 83 y.o. MRN: [id number]  Chief Complaint  Patient presents with   Hypertension    HPI   History of Present Illness   Shelly Hickman is an 83 year old female who presents for medication management and refills.  Hypertension management - Currently taking metoprolol , olmesartan , and amlodipine  for blood pressure control - No sluggishness or fatigue with increased metoprolol  dose - No current lower extremity swelling with amlodipine , though previously experienced this side effect at higher dose  - Reports good home BP readings that are at goal  Neuropathic pain management - Currently taking gabapentin  and Cymbalta  - Recently increased gabapentin  dose to 300 mg at bedtime without drowsiness or other side effects  Hyperlipidemia management - Currently taking simvastatin  and Lovaza  without side effects   Medication refill needs - Plans to travel to [place]  on December 18th for ten days to visit her daughter - Requests medication refills to ensure adequate supply during travel     ROS Per HPI.    Objective:     BP 127/76   Pulse 74   Temp 98.3 F (36.8 C) (Oral)   Ht 5' 6 (1.676 m)   Wt 224 lb 0.6 oz (101.6 kg)   SpO2 97%   BMI 36.16 kg/m    Physical Exam   No results found for any visits on 01/19/24.  Last CBC Lab Results  Component Value Date   WBC 9.2 09/08/2023   HGB 13.5 09/08/2023   HCT 42.3 09/08/2023   MCV 87 09/08/2023   MCH 27.7 09/08/2023   RDW 13.1 09/08/2023   PLT 370 09/08/2023   Last metabolic panel Lab Results  Component Value Date   GLUCOSE 126 (H) 09/08/2023   NA 132 (L) 09/08/2023   K 4.8 09/08/2023   CL 97 09/08/2023   CO2 19 (L) 09/08/2023   BUN 21 09/08/2023   CREATININE 0.75 09/08/2023   EGFR 80 09/08/2023   CALCIUM  9.7 09/08/2023   PROT 6.8 09/08/2023   ALBUMIN  4.2 09/08/2023   LABGLOB 2.6 09/08/2023    AGRATIO 1.8 05/27/2022   BILITOT 0.2 09/08/2023   ALKPHOS 78 09/08/2023   AST 32 09/08/2023   ALT 31 09/08/2023   ANIONGAP 12 05/16/2023   Last lipids Lab Results  Component Value Date   CHOL 151 09/08/2023   HDL 44 09/08/2023   LDLCALC 85 09/08/2023   TRIG 122 09/08/2023   CHOLHDL 3.4 09/08/2023   Last hemoglobin A1c Lab Results  Component Value Date   HGBA1C 6.1 (A) 10/16/2023   Last thyroid  functions Lab Results  Component Value Date   TSH 3.800 09/08/2023   FREET4 1.12 06/05/2022   Last vitamin D  Lab Results  Component Value Date   VD25OH 36.9 11/13/2020      The ASCVD Risk score (Arnett DK, et al., 2019) failed to calculate for the following reasons:   The 2019 ASCVD risk score is only valid for ages 39 to 64    Assessment & Plan:   Class 2 severe obesity with serious comorbidity and body mass index (BMI) of 35.0 to 35.9 in adult, unspecified obesity type -     VITAMIN D  25 Hydroxy (Vit-D Deficiency, Fractures); Future -     TSH; Future -     Hemoglobin A1c; Future -     Lipid panel; Future -     Comprehensive metabolic panel  with GFR; Future -     CBC with Differential/Platelet; Future  Mixed hyperlipidemia Assessment & Plan: Last lipid panel: LDL 85, HDL 44, Trig 122. The ASCVD Risk score (Arnett DK, et al., 2019) failed to calculate for the following reasons:   The 2019 ASCVD risk score is only valid for ages 63 to 35 Continue simvastatin  40 mg daily, Lovaza  2 mg BID  Orders: -     Omega-3-acid  Ethyl Esters; Take 2 capsules (2 g total) by mouth 2 (two) times daily.  Dispense: 360 capsule; Refill: 1 -     Simvastatin ; Take 1 tablet (40 mg total) by mouth at bedtime.  Dispense: 90 tablet; Refill: 3 -     Lipid panel; Future -     Comprehensive metabolic panel with GFR; Future  Hypertriglyceridemia -     Omega-3-acid  Ethyl Esters; Take 2 capsules (2 g total) by mouth 2 (two) times daily.  Dispense: 360 capsule; Refill: 1  Acquired hypothyroidism -      TSH; Future  Primary localized osteoarthritis of right knee Assessment & Plan: Following with ortho. Continue Celebrex  for pain control.    Adjustment insomnia Assessment & Plan: Continue with trazodone  and gabapentin  300 mg nightly.    Benign hypertension Assessment & Plan: BP Goal < 130/80. At goal today in office after increasing Metoprolol  dose in August.  - Continue metoprolol  to 50 mg daily. - Continue olmesartan  40 mg daily  - Continue amlodipine  5 mg daily   Encouraged to continue ambulatory BP monitoring and notify of readings persistently above goal.  Will update labwork in January after holidays and plan for face-to-face OV in 6 months unless labs warrant sooner visit.    Other orders -     Olmesartan  Medoxomil; Take 1 tablet (40 mg total) by mouth daily.  Dispense: 90 tablet; Refill: 1 -     Gabapentin ; Take 3 capsules (300 mg total) by mouth at bedtime.  Dispense: 180 capsule; Refill: 1    Return in about 6 months (around 07/19/2024) for Med check.    Saddie JULIANNA Sacks, PA-C

## 2024-01-19 NOTE — Assessment & Plan Note (Signed)
 Continue with trazodone  and gabapentin  300 mg nightly.

## 2024-01-19 NOTE — Assessment & Plan Note (Signed)
 BP Goal < 130/80. At goal today in office after increasing Metoprolol  dose in August.  - Continue metoprolol  to 50 mg daily. - Continue olmesartan  40 mg daily  - Continue amlodipine  5 mg daily   Encouraged to continue ambulatory BP monitoring and notify of readings persistently above goal.  Will update labwork in January after holidays and plan for face-to-face OV in 6 months unless labs warrant sooner visit.

## 2024-01-19 NOTE — Assessment & Plan Note (Signed)
 Last lipid panel: LDL 85, HDL 44, Trig 122. The ASCVD Risk score (Arnett DK, et al., 2019) failed to calculate for the following reasons:   The 2019 ASCVD risk score is only valid for ages 61 to 55 Continue simvastatin  40 mg daily, Lovaza  2 mg BID.

## 2024-01-19 NOTE — Patient Instructions (Signed)
 VISIT SUMMARY: Today, we reviewed your medications and overall health. Your blood pressure is well-controlled, and we addressed your medication refills for your upcoming trip. We also discussed the importance of monitoring your blood pressure at home and getting the COVID-19 vaccine.  YOUR PLAN: HYPERTENSION: Your blood pressure is well-controlled with your current medications. -Continue taking metoprolol , olmesartan , and amlodipine  as prescribed. -A 90-day supply of olmesartan  has been sent to your pharmacy. -Monitor your blood pressure at home once or twice a week. -Report if your systolic blood pressure consistently exceeds 130 mmHg.  MIXED HYPERLIPIDEMIA AND PURE HYPERGLYCERIDEMIA: Your cholesterol levels are being managed with simvastatin  and Lovaza . -A 90-day supply of simvastatin  and Lovaza  has been sent to your pharmacy.  CHRONIC PAIN: Your neuropathic pain is being managed with gabapentin , which has been increased to 300 mg at bedtime without side effects. -A prescription for gabapentin  300 mg has been sent to your pharmacy.  GENERAL HEALTH MAINTENANCE: We discussed the importance of the COVID-19 vaccine for those over 65. -Obtain the COVID-19 vaccine at your local pharmacy.  If you have any problems before your next visit feel free to message me via MyChart (minor issues or questions) or call the office, otherwise you may reach out to schedule an office visit.  Thank you! Saddie Sacks, PA-C

## 2024-02-23 ENCOUNTER — Other Ambulatory Visit

## 2024-02-23 ENCOUNTER — Other Ambulatory Visit: Payer: Self-pay

## 2024-02-23 DIAGNOSIS — E039 Hypothyroidism, unspecified: Secondary | ICD-10-CM

## 2024-02-23 DIAGNOSIS — I10 Essential (primary) hypertension: Secondary | ICD-10-CM

## 2024-02-25 ENCOUNTER — Other Ambulatory Visit

## 2024-02-25 DIAGNOSIS — E782 Mixed hyperlipidemia: Secondary | ICD-10-CM

## 2024-02-25 DIAGNOSIS — E66812 Obesity, class 2: Secondary | ICD-10-CM

## 2024-02-25 DIAGNOSIS — E039 Hypothyroidism, unspecified: Secondary | ICD-10-CM

## 2024-02-26 LAB — COMPREHENSIVE METABOLIC PANEL WITH GFR
ALT: 24 IU/L (ref 0–32)
AST: 23 IU/L (ref 0–40)
Albumin: 4 g/dL (ref 3.7–4.7)
Alkaline Phosphatase: 67 IU/L (ref 48–129)
BUN/Creatinine Ratio: 16 (ref 12–28)
BUN: 14 mg/dL (ref 8–27)
Bilirubin Total: 0.4 mg/dL (ref 0.0–1.2)
CO2: 27 mmol/L (ref 20–29)
Calcium: 9.4 mg/dL (ref 8.7–10.3)
Chloride: 100 mmol/L (ref 96–106)
Creatinine, Ser: 0.86 mg/dL (ref 0.57–1.00)
Globulin, Total: 2.5 g/dL (ref 1.5–4.5)
Glucose: 110 mg/dL — ABNORMAL HIGH (ref 70–99)
Potassium: 4.3 mmol/L (ref 3.5–5.2)
Sodium: 138 mmol/L (ref 134–144)
Total Protein: 6.5 g/dL (ref 6.0–8.5)
eGFR: 68 mL/min/1.73

## 2024-02-26 LAB — CBC WITH DIFFERENTIAL/PLATELET
Basophils Absolute: 0 x10E3/uL (ref 0.0–0.2)
Basos: 0 %
EOS (ABSOLUTE): 0.3 x10E3/uL (ref 0.0–0.4)
Eos: 3 %
Hematocrit: 44.8 % (ref 34.0–46.6)
Hemoglobin: 14.3 g/dL (ref 11.1–15.9)
Immature Grans (Abs): 0 x10E3/uL (ref 0.0–0.1)
Immature Granulocytes: 0 %
Lymphocytes Absolute: 1.9 x10E3/uL (ref 0.7–3.1)
Lymphs: 21 %
MCH: 29.2 pg (ref 26.6–33.0)
MCHC: 31.9 g/dL (ref 31.5–35.7)
MCV: 92 fL (ref 79–97)
Monocytes Absolute: 0.8 x10E3/uL (ref 0.1–0.9)
Monocytes: 9 %
Neutrophils Absolute: 5.9 x10E3/uL (ref 1.4–7.0)
Neutrophils: 67 %
Platelets: 302 x10E3/uL (ref 150–450)
RBC: 4.89 x10E6/uL (ref 3.77–5.28)
RDW: 13.1 % (ref 11.7–15.4)
WBC: 9.1 x10E3/uL (ref 3.4–10.8)

## 2024-02-26 LAB — HEMOGLOBIN A1C
Est. average glucose Bld gHb Est-mCnc: 123 mg/dL
Hgb A1c MFr Bld: 5.9 % — ABNORMAL HIGH (ref 4.8–5.6)

## 2024-02-26 LAB — LIPID PANEL
Chol/HDL Ratio: 4.1 ratio (ref 0.0–4.4)
Cholesterol, Total: 163 mg/dL (ref 100–199)
HDL: 40 mg/dL
LDL Chol Calc (NIH): 96 mg/dL (ref 0–99)
Triglycerides: 151 mg/dL — ABNORMAL HIGH (ref 0–149)
VLDL Cholesterol Cal: 27 mg/dL (ref 5–40)

## 2024-02-26 LAB — TSH: TSH: 3.24 u[IU]/mL (ref 0.450–4.500)

## 2024-02-26 LAB — VITAMIN D 25 HYDROXY (VIT D DEFICIENCY, FRACTURES): Vit D, 25-Hydroxy: 36.9 ng/mL (ref 30.0–100.0)

## 2024-02-27 ENCOUNTER — Ambulatory Visit: Payer: Self-pay

## 2024-03-02 ENCOUNTER — Other Ambulatory Visit: Payer: Self-pay

## 2024-03-02 DIAGNOSIS — F5102 Adjustment insomnia: Secondary | ICD-10-CM

## 2024-06-17 ENCOUNTER — Ambulatory Visit

## 2024-07-19 ENCOUNTER — Ambulatory Visit
